# Patient Record
Sex: Female | Born: 1955
Health system: Southern US, Community
[De-identification: ages and names within clinical notes are randomized; demographics above are authoritative.]

## PROBLEM LIST (undated history)

## (undated) DIAGNOSIS — J45909 Unspecified asthma, uncomplicated: Secondary | ICD-10-CM

## (undated) DIAGNOSIS — E079 Disorder of thyroid, unspecified: Secondary | ICD-10-CM

## (undated) DIAGNOSIS — H401190 Primary open-angle glaucoma, unspecified eye, stage unspecified: Secondary | ICD-10-CM

## (undated) DIAGNOSIS — K649 Unspecified hemorrhoids: Secondary | ICD-10-CM

## (undated) DIAGNOSIS — M1990 Unspecified osteoarthritis, unspecified site: Secondary | ICD-10-CM

## (undated) DIAGNOSIS — R011 Cardiac murmur, unspecified: Secondary | ICD-10-CM

## (undated) DIAGNOSIS — I10 Essential (primary) hypertension: Secondary | ICD-10-CM

## (undated) HISTORY — DX: Unspecified asthma, uncomplicated: J45.909

## (undated) HISTORY — DX: Disorder of thyroid, unspecified: E07.9

## (undated) HISTORY — DX: Unspecified osteoarthritis, unspecified site: M19.90

## (undated) HISTORY — DX: Unspecified hemorrhoids: K64.9

## (undated) HISTORY — DX: Primary open-angle glaucoma, unspecified eye, stage unspecified: H40.1190

## (undated) HISTORY — PX: KNEE SURGERY: SHX244

## (undated) HISTORY — DX: Essential (primary) hypertension: I10

## (undated) HISTORY — PX: ABDOMINAL HYSTERECTOMY: SHX81

## (undated) HISTORY — DX: Cardiac murmur, unspecified: R01.1

## (undated) HISTORY — PX: TOTAL KNEE ARTHROPLASTY: SHX125

## (undated) HISTORY — PX: THYROIDECTOMY: SHX17

---

## 1999-03-27 ENCOUNTER — Encounter: Payer: Self-pay | Admitting: Emergency Medicine

## 1999-03-27 ENCOUNTER — Emergency Department (HOSPITAL_COMMUNITY): Admission: EM | Admit: 1999-03-27 | Discharge: 1999-03-27 | Payer: Self-pay | Admitting: Emergency Medicine

## 1999-03-29 ENCOUNTER — Ambulatory Visit: Admission: RE | Admit: 1999-03-29 | Discharge: 1999-03-29 | Payer: Self-pay | Admitting: Occupational Medicine

## 1999-03-29 ENCOUNTER — Encounter: Payer: Self-pay | Admitting: Occupational Medicine

## 1999-04-06 ENCOUNTER — Encounter: Admission: RE | Admit: 1999-04-06 | Discharge: 1999-04-06 | Payer: Self-pay | Admitting: *Deleted

## 1999-06-30 ENCOUNTER — Ambulatory Visit (HOSPITAL_BASED_OUTPATIENT_CLINIC_OR_DEPARTMENT_OTHER): Admission: RE | Admit: 1999-06-30 | Discharge: 1999-06-30 | Payer: Self-pay | Admitting: Orthopaedic Surgery

## 2000-04-11 ENCOUNTER — Encounter: Payer: Self-pay | Admitting: Orthopaedic Surgery

## 2000-04-17 ENCOUNTER — Inpatient Hospital Stay (HOSPITAL_COMMUNITY): Admission: RE | Admit: 2000-04-17 | Discharge: 2000-04-22 | Payer: Self-pay | Admitting: Orthopaedic Surgery

## 2000-07-03 ENCOUNTER — Ambulatory Visit (HOSPITAL_BASED_OUTPATIENT_CLINIC_OR_DEPARTMENT_OTHER): Admission: RE | Admit: 2000-07-03 | Discharge: 2000-07-03 | Payer: Self-pay | Admitting: Orthopaedic Surgery

## 2001-02-05 ENCOUNTER — Ambulatory Visit (HOSPITAL_BASED_OUTPATIENT_CLINIC_OR_DEPARTMENT_OTHER): Admission: RE | Admit: 2001-02-05 | Discharge: 2001-02-05 | Payer: Self-pay | Admitting: Orthopaedic Surgery

## 2001-12-02 ENCOUNTER — Emergency Department (HOSPITAL_COMMUNITY): Admission: EM | Admit: 2001-12-02 | Discharge: 2001-12-02 | Payer: Self-pay | Admitting: *Deleted

## 2001-12-09 ENCOUNTER — Emergency Department (HOSPITAL_COMMUNITY): Admission: EM | Admit: 2001-12-09 | Discharge: 2001-12-09 | Payer: Self-pay | Admitting: Emergency Medicine

## 2001-12-09 ENCOUNTER — Encounter: Payer: Self-pay | Admitting: Emergency Medicine

## 2002-01-27 ENCOUNTER — Ambulatory Visit (HOSPITAL_COMMUNITY): Admission: RE | Admit: 2002-01-27 | Discharge: 2002-01-27 | Payer: Self-pay | Admitting: Family Medicine

## 2002-09-12 ENCOUNTER — Emergency Department (HOSPITAL_COMMUNITY): Admission: EM | Admit: 2002-09-12 | Discharge: 2002-09-12 | Payer: Self-pay | Admitting: Emergency Medicine

## 2004-01-30 ENCOUNTER — Emergency Department (HOSPITAL_COMMUNITY): Admission: EM | Admit: 2004-01-30 | Discharge: 2004-01-30 | Payer: Self-pay | Admitting: Emergency Medicine

## 2005-08-01 ENCOUNTER — Ambulatory Visit: Payer: Self-pay | Admitting: Family Medicine

## 2005-08-07 ENCOUNTER — Ambulatory Visit: Payer: Self-pay | Admitting: *Deleted

## 2005-10-04 ENCOUNTER — Ambulatory Visit: Payer: Self-pay | Admitting: Family Medicine

## 2005-11-08 ENCOUNTER — Ambulatory Visit: Payer: Self-pay | Admitting: Family Medicine

## 2005-11-13 ENCOUNTER — Ambulatory Visit (HOSPITAL_COMMUNITY): Admission: RE | Admit: 2005-11-13 | Discharge: 2005-11-13 | Payer: Self-pay | Admitting: Family Medicine

## 2005-12-02 ENCOUNTER — Emergency Department (HOSPITAL_COMMUNITY): Admission: EM | Admit: 2005-12-02 | Discharge: 2005-12-02 | Payer: Self-pay | Admitting: Emergency Medicine

## 2005-12-07 ENCOUNTER — Ambulatory Visit: Payer: Self-pay | Admitting: Family Medicine

## 2005-12-15 ENCOUNTER — Ambulatory Visit: Payer: Self-pay | Admitting: Family Medicine

## 2006-04-17 ENCOUNTER — Inpatient Hospital Stay (HOSPITAL_COMMUNITY): Admission: RE | Admit: 2006-04-17 | Discharge: 2006-04-24 | Payer: Self-pay | Admitting: Orthopaedic Surgery

## 2006-04-17 DIAGNOSIS — Z9889 Other specified postprocedural states: Secondary | ICD-10-CM | POA: Insufficient documentation

## 2006-04-18 ENCOUNTER — Ambulatory Visit: Payer: Self-pay | Admitting: Physical Medicine & Rehabilitation

## 2006-04-29 ENCOUNTER — Emergency Department (HOSPITAL_COMMUNITY): Admission: EM | Admit: 2006-04-29 | Discharge: 2006-04-30 | Payer: Self-pay | Admitting: Emergency Medicine

## 2006-05-22 ENCOUNTER — Encounter: Admission: RE | Admit: 2006-05-22 | Discharge: 2006-08-20 | Payer: Self-pay | Admitting: Orthopaedic Surgery

## 2006-08-21 ENCOUNTER — Encounter: Admission: RE | Admit: 2006-08-21 | Discharge: 2006-09-25 | Payer: Self-pay | Admitting: Orthopaedic Surgery

## 2006-09-14 ENCOUNTER — Ambulatory Visit: Payer: Self-pay | Admitting: Internal Medicine

## 2007-05-22 ENCOUNTER — Encounter (INDEPENDENT_AMBULATORY_CARE_PROVIDER_SITE_OTHER): Payer: Self-pay | Admitting: Family Medicine

## 2007-07-31 ENCOUNTER — Ambulatory Visit: Payer: Self-pay | Admitting: Family Medicine

## 2007-08-23 ENCOUNTER — Encounter (INDEPENDENT_AMBULATORY_CARE_PROVIDER_SITE_OTHER): Payer: Self-pay | Admitting: Family Medicine

## 2007-09-04 ENCOUNTER — Ambulatory Visit: Payer: Self-pay | Admitting: Family Medicine

## 2007-09-04 ENCOUNTER — Telehealth (INDEPENDENT_AMBULATORY_CARE_PROVIDER_SITE_OTHER): Payer: Self-pay | Admitting: Family Medicine

## 2007-09-04 DIAGNOSIS — E1165 Type 2 diabetes mellitus with hyperglycemia: Secondary | ICD-10-CM | POA: Insufficient documentation

## 2007-09-04 DIAGNOSIS — IMO0002 Reserved for concepts with insufficient information to code with codable children: Secondary | ICD-10-CM | POA: Insufficient documentation

## 2007-09-04 DIAGNOSIS — J45909 Unspecified asthma, uncomplicated: Secondary | ICD-10-CM | POA: Insufficient documentation

## 2007-09-04 LAB — CONVERTED CEMR LAB
Bilirubin Urine: NEGATIVE
Glucose, Urine, Semiquant: NEGATIVE
Nitrite: NEGATIVE
Protein, U semiquant: 30
Specific Gravity, Urine: >=1.03
Urobilinogen, UA: NEGATIVE
WBC Urine, dipstick: NEGATIVE
pH: 5.5

## 2007-09-16 ENCOUNTER — Ambulatory Visit (HOSPITAL_COMMUNITY): Admission: RE | Admit: 2007-09-16 | Discharge: 2007-09-16 | Payer: Self-pay | Admitting: Family Medicine

## 2007-09-18 LAB — CONVERTED CEMR LAB
ALT: 9 units/L (ref 0–35)
AST: 13 units/L (ref 0–37)
Albumin: 4.4 g/dL (ref 3.5–5.2)
Alkaline Phosphatase: 108 units/L (ref 39–117)
BUN: 12 mg/dL (ref 6–23)
Basophils Absolute: 0 10*3/uL (ref 0.0–0.1)
Basophils Relative: 0 % (ref 0–1)
CO2: 26 meq/L (ref 19–32)
Calcium: 8.8 mg/dL (ref 8.4–10.5)
Chloride: 100 meq/L (ref 96–112)
Cholesterol: 165 mg/dL (ref 0–200)
Creatinine, Ser: 1.15 mg/dL (ref 0.40–1.20)
Eosinophils Absolute: 0.2 10*3/uL (ref 0.2–0.7)
Eosinophils Relative: 2 % (ref 0–5)
Glucose, Bld: 128 mg/dL — ABNORMAL HIGH (ref 70–99)
HCT: 42.3 % (ref 36.0–46.0)
HDL: 48 mg/dL (ref >39)
Hemoglobin: 13.7 g/dL (ref 12.0–15.0)
Hgb A1c MFr Bld: 7.8 % — ABNORMAL HIGH (ref 4.6–6.1)
LDL Cholesterol: 96 mg/dL (ref 0–99)
Lymphocytes Relative: 21 % (ref 12–46)
Lymphs Abs: 2.4 10*3/uL (ref 0.7–4.0)
MCHC: 32.4 g/dL (ref 30.0–36.0)
MCV: 93.4 fL (ref 78.0–100.0)
Monocytes Absolute: 0.5 10*3/uL (ref 0.1–1.0)
Monocytes Relative: 4 % (ref 3–12)
Neutro Abs: 8.2 10*3/uL — ABNORMAL HIGH (ref 1.7–7.7)
Neutrophils Relative %: 72 % (ref 43–77)
Platelets: 305 10*3/uL (ref 150–400)
Potassium: 4.2 meq/L (ref 3.5–5.3)
RBC: 4.53 M/uL (ref 3.87–5.11)
RDW: 13.2 % (ref 11.5–15.5)
Sodium: 141 meq/L (ref 135–145)
TSH: 9.271 microintl units/mL — ABNORMAL HIGH (ref 0.350–5.50)
Total Bilirubin: 0.7 mg/dL (ref 0.3–1.2)
Total CHOL/HDL Ratio: 3.4
Total Protein: 9 g/dL — ABNORMAL HIGH (ref 6.0–8.3)
Triglycerides: 104 mg/dL (ref <150)
VLDL: 21 mg/dL (ref 0–40)
WBC: 11.3 10*3/uL — ABNORMAL HIGH (ref 4.0–10.5)

## 2007-10-15 ENCOUNTER — Ambulatory Visit: Payer: Self-pay | Admitting: Family Medicine

## 2007-10-15 DIAGNOSIS — E039 Hypothyroidism, unspecified: Secondary | ICD-10-CM | POA: Insufficient documentation

## 2007-10-15 LAB — CONVERTED CEMR LAB: Blood Glucose, Fingerstick: 121

## 2007-11-09 ENCOUNTER — Emergency Department (HOSPITAL_COMMUNITY): Admission: EM | Admit: 2007-11-09 | Discharge: 2007-11-09 | Payer: Self-pay | Admitting: Emergency Medicine

## 2007-12-16 ENCOUNTER — Ambulatory Visit: Payer: Self-pay | Admitting: Family Medicine

## 2007-12-16 DIAGNOSIS — R030 Elevated blood-pressure reading, without diagnosis of hypertension: Secondary | ICD-10-CM | POA: Insufficient documentation

## 2007-12-16 DIAGNOSIS — R7989 Other specified abnormal findings of blood chemistry: Secondary | ICD-10-CM | POA: Insufficient documentation

## 2007-12-16 DIAGNOSIS — L659 Nonscarring hair loss, unspecified: Secondary | ICD-10-CM | POA: Insufficient documentation

## 2007-12-16 LAB — CONVERTED CEMR LAB
ALT: 11 units/L (ref 0–35)
AST: 12 units/L (ref 0–37)
Albumin ELP: 48.2 % — ABNORMAL LOW (ref 55.8–66.1)
Albumin: 4.2 g/dL (ref 3.5–5.2)
Alkaline Phosphatase: 97 units/L (ref 39–117)
Alpha-1-Globulin: 3.9 % (ref 2.9–4.9)
Alpha-2-Globulin: 10.1 % (ref 7.1–11.8)
BUN: 11 mg/dL (ref 6–23)
Beta Globulin: 5.6 % (ref 4.7–7.2)
Blood Glucose, Fingerstick: 153
CO2: 25 meq/L (ref 19–32)
Calcium: 9.3 mg/dL (ref 8.4–10.5)
Chloride: 103 meq/L (ref 96–112)
Creatinine, Ser: 1.08 mg/dL (ref 0.40–1.20)
Gamma Globulin: 25.7 % — ABNORMAL HIGH (ref 11.1–18.8)
Glucose, Bld: 105 mg/dL — ABNORMAL HIGH (ref 70–99)
Hgb A1c MFr Bld: 7.2 %
Potassium: 4 meq/L (ref 3.5–5.3)
Sodium: 138 meq/L (ref 135–145)
TSH: 3.162 microintl units/mL (ref 0.350–5.50)
Total Bilirubin: 0.4 mg/dL (ref 0.3–1.2)
Total Protein, Serum Electrophoresis: 8.4 g/dL — ABNORMAL HIGH (ref 6.0–8.3)
Total Protein: 8.4 g/dL — ABNORMAL HIGH (ref 6.0–8.3)

## 2007-12-17 ENCOUNTER — Encounter (INDEPENDENT_AMBULATORY_CARE_PROVIDER_SITE_OTHER): Payer: Self-pay | Admitting: Family Medicine

## 2008-01-17 ENCOUNTER — Telehealth (INDEPENDENT_AMBULATORY_CARE_PROVIDER_SITE_OTHER): Payer: Self-pay | Admitting: Family Medicine

## 2008-01-24 ENCOUNTER — Encounter (INDEPENDENT_AMBULATORY_CARE_PROVIDER_SITE_OTHER): Payer: Self-pay | Admitting: Family Medicine

## 2008-01-27 ENCOUNTER — Telehealth (INDEPENDENT_AMBULATORY_CARE_PROVIDER_SITE_OTHER): Payer: Self-pay | Admitting: *Deleted

## 2008-02-19 ENCOUNTER — Telehealth (INDEPENDENT_AMBULATORY_CARE_PROVIDER_SITE_OTHER): Payer: Self-pay | Admitting: Family Medicine

## 2008-03-18 ENCOUNTER — Ambulatory Visit: Payer: Self-pay | Admitting: Family Medicine

## 2008-03-18 LAB — CONVERTED CEMR LAB: Blood Glucose, Fingerstick: 148

## 2008-03-24 ENCOUNTER — Telehealth (INDEPENDENT_AMBULATORY_CARE_PROVIDER_SITE_OTHER): Payer: Self-pay | Admitting: *Deleted

## 2008-04-15 ENCOUNTER — Encounter (INDEPENDENT_AMBULATORY_CARE_PROVIDER_SITE_OTHER): Payer: Self-pay | Admitting: Nurse Practitioner

## 2008-04-23 ENCOUNTER — Encounter (INDEPENDENT_AMBULATORY_CARE_PROVIDER_SITE_OTHER): Payer: Self-pay | Admitting: Internal Medicine

## 2008-04-23 ENCOUNTER — Encounter (INDEPENDENT_AMBULATORY_CARE_PROVIDER_SITE_OTHER): Payer: Self-pay | Admitting: Nurse Practitioner

## 2008-04-23 DIAGNOSIS — M722 Plantar fascial fibromatosis: Secondary | ICD-10-CM | POA: Insufficient documentation

## 2008-04-23 DIAGNOSIS — M1712 Unilateral primary osteoarthritis, left knee: Secondary | ICD-10-CM | POA: Insufficient documentation

## 2008-08-18 ENCOUNTER — Ambulatory Visit: Payer: Self-pay | Admitting: Family Medicine

## 2008-08-18 DIAGNOSIS — I10 Essential (primary) hypertension: Secondary | ICD-10-CM | POA: Insufficient documentation

## 2008-08-18 LAB — CONVERTED CEMR LAB
ALT: 9 units/L (ref 0–35)
AST: 10 units/L (ref 0–37)
Albumin: 4 g/dL (ref 3.5–5.2)
Alkaline Phosphatase: 92 units/L (ref 39–117)
BUN: 14 mg/dL (ref 6–23)
Basophils Absolute: 0 10*3/uL (ref 0.0–0.1)
Basophils Relative: 0 % (ref 0–1)
Blood Glucose, Fingerstick: 121
CO2: 23 meq/L (ref 19–32)
Calcium: 8.8 mg/dL (ref 8.4–10.5)
Chloride: 106 meq/L (ref 96–112)
Cholesterol: 155 mg/dL (ref 0–200)
Creatinine, Ser: 1.02 mg/dL (ref 0.40–1.20)
Creatinine, Urine: 193.1 mg/dL
Eosinophils Absolute: 0.4 10*3/uL (ref 0.0–0.7)
Eosinophils Relative: 4 % (ref 0–5)
Glucose, Bld: 77 mg/dL (ref 70–99)
HCT: 39.2 % (ref 36.0–46.0)
HDL: 35 mg/dL — ABNORMAL LOW (ref >39)
Hemoglobin: 12.8 g/dL (ref 12.0–15.0)
Hgb A1c MFr Bld: 6.4 %
LDL Cholesterol: 101 mg/dL — ABNORMAL HIGH (ref 0–99)
Lymphocytes Relative: 26 % (ref 12–46)
Lymphs Abs: 2.6 10*3/uL (ref 0.7–4.0)
MCHC: 32.7 g/dL (ref 30.0–36.0)
MCV: 90.7 fL (ref 78.0–100.0)
Microalb Creat Ratio: 1.9 mg/g (ref 0.0–30.0)
Microalb, Ur: 0.36 mg/dL (ref 0.00–1.89)
Monocytes Absolute: 0.6 10*3/uL (ref 0.1–1.0)
Monocytes Relative: 6 % (ref 3–12)
Neutro Abs: 6.6 10*3/uL (ref 1.7–7.7)
Neutrophils Relative %: 64 % (ref 43–77)
Platelets: 269 10*3/uL (ref 150–400)
Potassium: 4.1 meq/L (ref 3.5–5.3)
RBC: 4.32 M/uL (ref 3.87–5.11)
RDW: 13.1 % (ref 11.5–15.5)
Sodium: 139 meq/L (ref 135–145)
TSH: 1.361 microintl units/mL (ref 0.350–4.50)
Total Bilirubin: 0.6 mg/dL (ref 0.3–1.2)
Total CHOL/HDL Ratio: 4.4
Total Protein: 7.8 g/dL (ref 6.0–8.3)
Triglycerides: 95 mg/dL (ref <150)
VLDL: 19 mg/dL (ref 0–40)
WBC: 10.2 10*3/uL (ref 4.0–10.5)

## 2008-08-19 ENCOUNTER — Encounter (INDEPENDENT_AMBULATORY_CARE_PROVIDER_SITE_OTHER): Payer: Self-pay | Admitting: Family Medicine

## 2008-08-21 ENCOUNTER — Encounter (INDEPENDENT_AMBULATORY_CARE_PROVIDER_SITE_OTHER): Payer: Self-pay | Admitting: Internal Medicine

## 2008-08-26 ENCOUNTER — Telehealth (INDEPENDENT_AMBULATORY_CARE_PROVIDER_SITE_OTHER): Payer: Self-pay | Admitting: *Deleted

## 2008-08-28 ENCOUNTER — Telehealth (INDEPENDENT_AMBULATORY_CARE_PROVIDER_SITE_OTHER): Payer: Self-pay | Admitting: Family Medicine

## 2008-09-01 ENCOUNTER — Ambulatory Visit: Payer: Self-pay | Admitting: Family Medicine

## 2008-09-01 DIAGNOSIS — H6690 Otitis media, unspecified, unspecified ear: Secondary | ICD-10-CM | POA: Insufficient documentation

## 2008-09-01 LAB — CONVERTED CEMR LAB: Blood Glucose, Fingerstick: 108

## 2008-09-16 ENCOUNTER — Ambulatory Visit (HOSPITAL_COMMUNITY): Admission: RE | Admit: 2008-09-16 | Discharge: 2008-09-16 | Payer: Self-pay | Admitting: Family Medicine

## 2008-10-16 ENCOUNTER — Ambulatory Visit: Payer: Self-pay | Admitting: Internal Medicine

## 2008-10-16 ENCOUNTER — Telehealth (INDEPENDENT_AMBULATORY_CARE_PROVIDER_SITE_OTHER): Payer: Self-pay | Admitting: *Deleted

## 2008-10-16 DIAGNOSIS — H6980 Other specified disorders of Eustachian tube, unspecified ear: Secondary | ICD-10-CM | POA: Insufficient documentation

## 2008-10-16 DIAGNOSIS — H6990 Unspecified Eustachian tube disorder, unspecified ear: Secondary | ICD-10-CM | POA: Insufficient documentation

## 2008-10-16 LAB — CONVERTED CEMR LAB: Blood Glucose, Fingerstick: 100

## 2008-11-30 ENCOUNTER — Encounter (INDEPENDENT_AMBULATORY_CARE_PROVIDER_SITE_OTHER): Payer: Self-pay | Admitting: Family Medicine

## 2009-01-18 ENCOUNTER — Ambulatory Visit: Payer: Self-pay | Admitting: Family Medicine

## 2009-01-18 DIAGNOSIS — K5909 Other constipation: Secondary | ICD-10-CM | POA: Insufficient documentation

## 2009-01-18 LAB — CONVERTED CEMR LAB: Blood Glucose, Fingerstick: 86

## 2009-01-26 ENCOUNTER — Telehealth (INDEPENDENT_AMBULATORY_CARE_PROVIDER_SITE_OTHER): Payer: Self-pay | Admitting: Family Medicine

## 2009-03-03 ENCOUNTER — Encounter (INDEPENDENT_AMBULATORY_CARE_PROVIDER_SITE_OTHER): Payer: Self-pay | Admitting: Internal Medicine

## 2009-03-03 ENCOUNTER — Encounter: Payer: Self-pay | Admitting: Physician Assistant

## 2009-03-04 ENCOUNTER — Encounter (INDEPENDENT_AMBULATORY_CARE_PROVIDER_SITE_OTHER): Payer: Self-pay | Admitting: Internal Medicine

## 2009-03-05 ENCOUNTER — Encounter (INDEPENDENT_AMBULATORY_CARE_PROVIDER_SITE_OTHER): Payer: Self-pay | Admitting: Internal Medicine

## 2009-03-16 ENCOUNTER — Encounter: Payer: Self-pay | Admitting: Physician Assistant

## 2009-04-19 ENCOUNTER — Telehealth (INDEPENDENT_AMBULATORY_CARE_PROVIDER_SITE_OTHER): Payer: Self-pay | Admitting: Family Medicine

## 2009-07-20 ENCOUNTER — Telehealth: Payer: Self-pay | Admitting: Physician Assistant

## 2009-08-10 ENCOUNTER — Ambulatory Visit: Payer: Self-pay | Admitting: Physician Assistant

## 2009-08-10 LAB — CONVERTED CEMR LAB
ALT: 12 units/L (ref 0–35)
AST: 15 units/L (ref 0–37)
Albumin: 4.2 g/dL (ref 3.5–5.2)
Alkaline Phosphatase: 85 units/L (ref 39–117)
BUN: 11 mg/dL (ref 6–23)
Basophils Absolute: 0.1 10*3/uL (ref 0.0–0.1)
Basophils Relative: 1 % (ref 0–1)
Blood Glucose, Fingerstick: 69
CO2: 22 meq/L (ref 19–32)
Calcium: 9.1 mg/dL (ref 8.4–10.5)
Chloride: 103 meq/L (ref 96–112)
Creatinine, Ser: 1.07 mg/dL (ref 0.40–1.20)
Eosinophils Absolute: 0.3 10*3/uL (ref 0.0–0.7)
Eosinophils Relative: 3 % (ref 0–5)
Glucose, Bld: 74 mg/dL (ref 70–99)
HCT: 40.5 % (ref 36.0–46.0)
Hemoglobin: 13.9 g/dL (ref 12.0–15.0)
Hgb A1c MFr Bld: 6.6 %
Lymphocytes Relative: 28 % (ref 12–46)
Lymphs Abs: 2.7 10*3/uL (ref 0.7–4.0)
MCHC: 34.3 g/dL (ref 30.0–36.0)
MCV: 89.6 fL (ref 78.0–100.0)
Microalb, Ur: 0.5 mg/dL (ref 0.00–1.89)
Monocytes Absolute: 0.6 10*3/uL (ref 0.1–1.0)
Monocytes Relative: 6 % (ref 3–12)
Neutro Abs: 6 10*3/uL (ref 1.7–7.7)
Neutrophils Relative %: 62 % (ref 43–77)
Platelets: 288 10*3/uL (ref 150–400)
Potassium: 4.2 meq/L (ref 3.5–5.3)
RBC: 4.52 M/uL (ref 3.87–5.11)
RDW: 13.2 % (ref 11.5–15.5)
Sodium: 140 meq/L (ref 135–145)
TSH: 3.019 microintl units/mL (ref 0.350–4.500)
Total Bilirubin: 0.5 mg/dL (ref 0.3–1.2)
Total Protein: 7.9 g/dL (ref 6.0–8.3)
WBC: 9.7 10*3/uL (ref 4.0–10.5)

## 2009-08-11 ENCOUNTER — Telehealth: Payer: Self-pay | Admitting: Physician Assistant

## 2009-08-11 ENCOUNTER — Encounter: Payer: Self-pay | Admitting: Physician Assistant

## 2009-08-17 ENCOUNTER — Encounter: Payer: Self-pay | Admitting: Physician Assistant

## 2009-09-22 ENCOUNTER — Ambulatory Visit (HOSPITAL_COMMUNITY): Admission: RE | Admit: 2009-09-22 | Discharge: 2009-09-22 | Payer: Self-pay | Admitting: Internal Medicine

## 2009-10-22 ENCOUNTER — Other Ambulatory Visit: Admission: RE | Admit: 2009-10-22 | Discharge: 2009-10-22 | Payer: Self-pay | Admitting: Internal Medicine

## 2009-10-22 ENCOUNTER — Ambulatory Visit: Payer: Self-pay | Admitting: Physician Assistant

## 2009-10-22 DIAGNOSIS — R82998 Other abnormal findings in urine: Secondary | ICD-10-CM | POA: Insufficient documentation

## 2009-10-22 LAB — CONVERTED CEMR LAB
Bilirubin Urine: NEGATIVE
Blood Glucose, Fingerstick: 143
Glucose, Urine, Semiquant: NEGATIVE
Hgb A1c MFr Bld: 6.8 %
KOH Prep: NEGATIVE
Nitrite: NEGATIVE
Specific Gravity, Urine: >=1.03
Urobilinogen, UA: 0.2
Whiff Test: NEGATIVE
pH: 5.5

## 2009-10-23 ENCOUNTER — Encounter: Payer: Self-pay | Admitting: Physician Assistant

## 2009-10-26 ENCOUNTER — Telehealth: Payer: Self-pay | Admitting: Physician Assistant

## 2009-10-26 LAB — CONVERTED CEMR LAB
ALT: 13 units/L (ref 0–35)
AST: 16 units/L (ref 0–37)
Albumin: 3.9 g/dL (ref 3.5–5.2)
Alkaline Phosphatase: 96 units/L (ref 39–117)
BUN: 11 mg/dL (ref 6–23)
Bacteria, UA: NONE SEEN
CO2: 27 meq/L (ref 19–32)
Calcium: 9.1 mg/dL (ref 8.4–10.5)
Casts: NONE SEEN /lpf
Chloride: 104 meq/L (ref 96–112)
Cholesterol: 162 mg/dL (ref 0–200)
Creatinine, Ser: 1.04 mg/dL (ref 0.40–1.20)
Crystals: NONE SEEN
Glucose, Bld: 124 mg/dL — ABNORMAL HIGH (ref 70–99)
HDL: 33 mg/dL — ABNORMAL LOW (ref >39)
LDL Cholesterol: 108 mg/dL — ABNORMAL HIGH (ref 0–99)
Pap Smear: NEGATIVE
Potassium: 4.5 meq/L (ref 3.5–5.3)
RBC / HPF: NONE SEEN (ref <3)
Sex Hormone Binding: 35 nmol/L (ref 18–114)
Sodium: 143 meq/L (ref 135–145)
Testosterone Free: 7.7 pg/mL — ABNORMAL HIGH (ref 0.6–6.8)
Testosterone-% Free: 1.7 % (ref 0.4–2.4)
Testosterone: 44.59 ng/dL (ref 10–70)
Total Bilirubin: 0.5 mg/dL (ref 0.3–1.2)
Total CHOL/HDL Ratio: 4.9
Total Protein: 7.5 g/dL (ref 6.0–8.3)
Triglycerides: 103 mg/dL (ref <150)
VLDL: 21 mg/dL (ref 0–40)

## 2009-11-05 ENCOUNTER — Ambulatory Visit: Payer: Self-pay | Admitting: Physician Assistant

## 2009-11-05 LAB — CONVERTED CEMR LAB
BUN: 16 mg/dL (ref 6–23)
CO2: 22 meq/L (ref 19–32)
Calcium: 9.1 mg/dL (ref 8.4–10.5)
Chloride: 103 meq/L (ref 96–112)
Creatinine, Ser: 1.08 mg/dL (ref 0.40–1.20)
Glucose, Bld: 149 mg/dL — ABNORMAL HIGH (ref 70–99)
Potassium: 4.3 meq/L (ref 3.5–5.3)
Sodium: 139 meq/L (ref 135–145)

## 2009-11-08 ENCOUNTER — Encounter: Payer: Self-pay | Admitting: Physician Assistant

## 2009-11-19 ENCOUNTER — Ambulatory Visit: Payer: Self-pay | Admitting: Physician Assistant

## 2009-11-19 DIAGNOSIS — L919 Hypertrophic disorder of the skin, unspecified: Secondary | ICD-10-CM

## 2009-11-19 DIAGNOSIS — L909 Atrophic disorder of skin, unspecified: Secondary | ICD-10-CM | POA: Insufficient documentation

## 2009-11-19 DIAGNOSIS — L723 Sebaceous cyst: Secondary | ICD-10-CM | POA: Insufficient documentation

## 2009-11-23 ENCOUNTER — Ambulatory Visit: Payer: Self-pay | Admitting: Physician Assistant

## 2009-11-23 LAB — CONVERTED CEMR LAB
BUN: 16 mg/dL (ref 6–23)
CO2: 22 meq/L (ref 19–32)
Calcium: 9.6 mg/dL (ref 8.4–10.5)
Chloride: 105 meq/L (ref 96–112)
Creatinine, Ser: 0.98 mg/dL (ref 0.40–1.20)
Glucose, Bld: 144 mg/dL — ABNORMAL HIGH (ref 70–99)
Potassium: 4.5 meq/L (ref 3.5–5.3)
Sodium: 141 meq/L (ref 135–145)

## 2009-11-24 ENCOUNTER — Encounter: Payer: Self-pay | Admitting: Physician Assistant

## 2009-12-07 ENCOUNTER — Ambulatory Visit: Payer: Self-pay | Admitting: Physician Assistant

## 2009-12-07 LAB — CONVERTED CEMR LAB
BUN: 13 mg/dL (ref 6–23)
CO2: 25 meq/L (ref 19–32)
Calcium: 9.3 mg/dL (ref 8.4–10.5)
Chloride: 101 meq/L (ref 96–112)
Creatinine, Ser: 1.13 mg/dL (ref 0.40–1.20)
Glucose, Bld: 153 mg/dL — ABNORMAL HIGH (ref 70–99)
Potassium: 4.1 meq/L (ref 3.5–5.3)
Sodium: 138 meq/L (ref 135–145)

## 2009-12-08 ENCOUNTER — Encounter: Payer: Self-pay | Admitting: Physician Assistant

## 2010-01-17 ENCOUNTER — Telehealth: Payer: Self-pay | Admitting: Physician Assistant

## 2010-02-17 ENCOUNTER — Telehealth: Payer: Self-pay | Admitting: Physician Assistant

## 2010-07-26 ENCOUNTER — Ambulatory Visit: Payer: Self-pay | Admitting: Nurse Practitioner

## 2010-07-26 DIAGNOSIS — D179 Benign lipomatous neoplasm, unspecified: Secondary | ICD-10-CM | POA: Insufficient documentation

## 2010-07-26 LAB — CONVERTED CEMR LAB
Bilirubin Urine: NEGATIVE
Blood Glucose, Fingerstick: 156
Glucose, Urine, Semiquant: NEGATIVE
Hgb A1c MFr Bld: 6.8 %
Ketones, urine, test strip: NEGATIVE
Microalb, Ur: 0.5 mg/dL (ref 0.00–1.89)
Nitrite: NEGATIVE
Protein, U semiquant: NEGATIVE
Specific Gravity, Urine: 1.025
Urobilinogen, UA: 0.2
WBC Urine, dipstick: NEGATIVE
pH: 5

## 2010-08-16 ENCOUNTER — Ambulatory Visit: Payer: Self-pay | Admitting: Nurse Practitioner

## 2010-08-16 LAB — CONVERTED CEMR LAB
Basophils Absolute: 0.1 10*3/uL (ref 0.0–0.1)
Basophils Relative: 1 % (ref 0–1)
Eosinophils Absolute: 0.4 10*3/uL (ref 0.0–0.7)
Eosinophils Relative: 4 % (ref 0–5)
HCT: 39.8 % (ref 36.0–46.0)
Hemoglobin: 13.8 g/dL (ref 12.0–15.0)
Lymphocytes Relative: 29 % (ref 12–46)
Lymphs Abs: 2.5 10*3/uL (ref 0.7–4.0)
MCHC: 34.7 g/dL (ref 30.0–36.0)
MCV: 92.6 fL (ref 78.0–100.0)
Monocytes Absolute: 0.4 10*3/uL (ref 0.1–1.0)
Monocytes Relative: 4 % (ref 3–12)
Neutro Abs: 5.5 10*3/uL (ref 1.7–7.7)
Neutrophils Relative %: 62 % (ref 43–77)
Platelets: 274 10*3/uL (ref 150–400)
RBC: 4.3 M/uL (ref 3.87–5.11)
RDW: 13.5 % (ref 11.5–15.5)
TSH: 2.613 microintl units/mL (ref 0.350–4.500)
WBC: 8.8 10*3/uL (ref 4.0–10.5)

## 2010-08-23 ENCOUNTER — Ambulatory Visit: Payer: Self-pay | Admitting: Nurse Practitioner

## 2010-08-23 LAB — CONVERTED CEMR LAB: Blood Glucose, Fingerstick: 93

## 2010-08-25 ENCOUNTER — Telehealth (INDEPENDENT_AMBULATORY_CARE_PROVIDER_SITE_OTHER): Payer: Self-pay | Admitting: Nurse Practitioner

## 2010-08-25 ENCOUNTER — Ambulatory Visit (HOSPITAL_COMMUNITY)
Admission: RE | Admit: 2010-08-25 | Discharge: 2010-08-25 | Payer: Self-pay | Source: Home / Self Care | Attending: Internal Medicine | Admitting: Internal Medicine

## 2010-09-23 ENCOUNTER — Ambulatory Visit (HOSPITAL_COMMUNITY)
Admission: RE | Admit: 2010-09-23 | Discharge: 2010-09-23 | Payer: Self-pay | Source: Home / Self Care | Attending: Internal Medicine | Admitting: Internal Medicine

## 2010-10-18 NOTE — Assessment & Plan Note (Signed)
Summary: f/u appt and labs/flu injection///cns   Vital Signs:  Patient profile:   55 year old female Weight:      256 pounds Temp:     98.2 degrees F Pulse rate:   89 / minute Pulse rhythm:   regular Resp:     18 per minute BP sitting:   134 / 79  (left arm) Cuff size:   large  Vitals Entered By: Vesta Mixer CMA (August 10, 2009 2:07 PM) CC: needs med refills, flu shot and would like to set up for CPP, Hypertension Management Is Patient Diabetic? Yes Pain Assessment Patient in pain? no      CBG Result 69  Does patient need assistance? Ambulation Normal   CC:  needs med refills, flu shot and would like to set up for CPP, and Hypertension Management.  History of Present Illness: First meeting with patient.  Previously followed by Dr. Barbaraann Barthel.  DM:  Does not check sugars.  Eye Dr is Dr. Wynona Luna.  Has annual eye exam.  Sees him q 6 mos.  No foot problems other than plantar fasciitis on the right.  Notes problems with alopecia.  Had colo. with Dr. Elnoria Howard.  Placed on amitiza for constipation.  No colo report received yet.    Asthma History    Initial Asthma Severity Rating:    Age range: 12+ years    Symptoms: 0-2 days/week    Nighttime Awakenings: 0-2/month    Interferes w/ normal activity: no limitations    SABA use (not for EIB): 0-2 days/week    Asthma Severity Assessment: Intermittent  Hypertension History:      She denies chest pain, dyspnea with exertion, orthopnea, and syncope.        Positive major cardiovascular risk factors include female age 11 years old or older, diabetes, and hypertension.  Negative major cardiovascular risk factors include negative family history for ischemic heart disease and non-tobacco-user status.       Habits & Providers     Ophthalmologist: Dr. Hanley Seamen     Orthopedist: Dr. Jerl Santos  Problems Prior to Update: 1)  Constipation, Chronic  (ICD-564.09) 2)  Eustachian Tube Dysfunction, Right  (ICD-381.81) 3)  Lom   (ICD-382.9) 4)  Hypertension  (ICD-401.9) 5)  Unspecified Breast Screening  (ICD-V76.10) 6)  Essential Hypertension, Benign  (ICD-401.1) 7)  Plantar Fasciitis, Right  (ICD-728.71) 8)  Arthroscopy, Right Knee, Hx of  (ICD-V45.89) 9)  Osteoarthritis, Knee, Left  (ICD-715.96) 10)  Hair Loss  (ICD-704.00) 11)  Elevated Bp Reading Without Dx Hypertension  (ICD-796.2) 12)  Other Abnormal Blood Chemistry  (ICD-790.6) 13)  Hypothyroidism  (ICD-244.9) 14)  Examination, Routine Medical  (ICD-V70.0) 15)  Asthma  (ICD-493.90) 16)  Diabetes Mellitus, Type II  (ICD-250.00) 17)  Need Prophylactic Vaccination&inoculation Flu  (ICD-V04.81)  Current Medications (verified): 1)  Levothroid 50 Mcg  Tabs (Levothyroxine Sodium) .... Take 1 Tablet By Mouth Once A Day For Thyroid 2)  Glucometer and Diabetic Supplies. .... Check Blood Sugar Two Times A Day and Record  Dx=250.0. Not On Insulin 3)  Glucometer Lancets and Strips .... Check Cbg Two Times A Day and Record Dx=250.0 Has High Sugars 4)  Diabetic Tennis Shoes .... Has Pre-Ulcer Calluses Diabetic 5)  Lisinopril 10 Mg  Tabs (Lisinopril) .... Take 1 Tab By Mouth Each Morning. 6)  Proventil Hfa 108 (90 Base) Mcg/act Aers (Albuterol Sulfate) .... Use 2 Puffs Every 4-6 Hours As Needed For Wheezing 7)  Travatan 0.004 % Soln (Travoprost) .... One  Drop Into Each Eye Every Morning 8)  Metformin Hcl 500 Mg Tabs (Metformin Hcl) .... Take Two Tabs By Mouth in The Morning 9)  Loratadine 10 Mg Tabs (Loratadine) .Marland Kitchen.. 1 Tab By Mouth Daily 10)  Fluticasone Propionate 50 Mcg/act Susp (Fluticasone Propionate) .... 2 Sprays Each Nostril Daily 11)  Amitiza 8 Mcg Caps (Lubiprostone) .... Unknown Strength and Dose--Dr. Hung:for Constipation  Allergies (verified): 1)  ! Penicillin 2)  ! Coumadin 3)  ! * Latex  Past History:  Past Medical History: Diabetes mellitus, type II (diet-controlled). 01/2002.Starts oral medicine 1/09. Asthma Hypothyroidism Hypertension  (08/18/2008) Chronic Constipation Right foot plantar fasciitis Glaucoma suspect (on Travatan) Osteoarthritis bilat knees   a.  has knee injection q 3 mos with ortho.  Past Surgical History: s/p right knee replacement 04/17/2000 and then revision 04/17/2006 s/p cataract surgery bilaterally 10/07 s/p total hysterectomy (TAH/BSO) 1983 s/p thyroidectomy,1973 for over-active thyroid. s/p left knee arthroscopy; distant  Family History: Mother living has ?MS, DM,HTN Father living DM,HTN,glaucoma,alzheimers, s/p leg amp.  No known cardiac  or strokes in family. Sister died liver cancer age 17. Sister died "cancer" age 53.  Social History: Reviewed history from 10/15/2007 and no changes required. Occupation: on disability;prior CNA H/o disability for knee replacements Single Former Smoker Alcohol use-no Drug use-no Completed GED 1998  Review of Systems General:  Complains of fatigue. GI:  Complains of constipation; denies bloody stools and dark tarry stools. Endo:  Denies cold intolerance and heat intolerance; +alopecia.   Impression & Recommendations:  Problem # 1:  DIABETES MELLITUS, TYPE II (ICD-250.00) A1C pending  The following medications were removed from the medication list:    Glucophage Xr 500 Mg Tb24 (Metformin hcl) .Marland Kitchen... Take 2 tablets po  every morning Her updated medication list for this problem includes:    Lisinopril 10 Mg Tabs (Lisinopril) .Marland Kitchen... Take 1 tab by mouth each morning.    Metformin Hcl 500 Mg Tabs (Metformin hcl) .Marland Kitchen... Take two tabs by mouth in the morning  Orders: Capillary Blood Glucose/CBG (04540) T-Urine Microalbumin w/creat. ratio [phone])  Problem # 2:  HYPERTENSION (ICD-401.9) could be better  cont current rx if still above goal at CPP, change lisinopril to 20mg   Her updated medication list for this problem includes:    Lisinopril 10 Mg Tabs (Lisinopril) .Marland Kitchen... Take 1 tab by mouth each morning.  Orders: T-Comprehensive  Metabolic Panel (13086-57846)  Problem # 3:  HAIR LOSS (ICD-704.00) recheck TSH, CBC ? related to thyroid replacement  Orders: T-CBC w/Diff (96295-28413) T-TSH [phone])  Problem # 4:  HYPOTHYROIDISM (ICD-244.9) as above  refill meds  Her updated medication list for this problem includes:    Levothroid 50 Mcg Tabs (Levothyroxine sodium) .Marland Kitchen... Take 1 tablet by mouth once a day for thyroid  Orders: T-TSH ([id number])  Problem # 5:  EXAMINATION, ROUTINE MEDICAL (ICD-V70.0) Flu shot today schedule CPP  Problem # 6:  ASTHMA (ICD-493.90) controlled  Her updated medication list for this problem includes:    Proventil Hfa 108 (90 Base) Mcg/act Aers (Albuterol sulfate) ..... Use 2 puffs every 4-6 hours as needed for wheezing  Complete Medication List: 1)  Levothroid 50 Mcg Tabs (Levothyroxine sodium) .... Take 1 tablet by mouth once a day for thyroid 2)  Glucometer and Diabetic Supplies.  .... Check blood sugar two times a day and record  dx=250.0. not on insulin 3)  Glucometer Lancets and Strips  .... Check cbg two times a day and record dx=250.0 has high sugars 4)  Diabetic  Tennis Shoes  .... Has pre-ulcer calluses diabetic 5)  Lisinopril 10 Mg Tabs (Lisinopril) .... Take 1 tab by mouth each morning. 6)  Proventil Hfa 108 (90 Base) Mcg/act Aers (Albuterol sulfate) .... Use 2 puffs every 4-6 hours as needed for wheezing 7)  Travatan 0.004 % Soln (Travoprost) .... One drop into each eye every morning 8)  Metformin Hcl 500 Mg Tabs (Metformin hcl) .... Take two tabs by mouth in the morning 9)  Loratadine 10 Mg Tabs (Loratadine) .Marland Kitchen.. 1 tab by mouth daily 10)  Fluticasone Propionate 50 Mcg/act Susp (Fluticasone propionate) .... 2 sprays each nostril daily 11)  Amitiza 24 Mcg Caps (Lubiprostone) .... Take 1 capsule by mouth two times a day  Other Orders: Flu Vaccine 39yrs + ([address]) Admin 1st Vaccine ([address]) Admin 1st Vaccine [facility]) [phone])  Hypertension Assessment/Plan:       The patient's hypertensive risk group is category C: Target organ damage and/or diabetes.  Her calculated 10 year risk of coronary heart disease is 20 %.  Today's blood pressure is 134/79.  Her blood pressure goal is < 130/80.  Patient Instructions: 1)  Please sign form to get record of colonoscopy from Dr. Elnoria Howard sent to me. 2)  Flu shot today. 3)  Please schedule a follow-up appointment in 3 months with Ottilie Wigglesworth for CPP. 4)    Prescriptions: METFORMIN HCL 500 MG TABS (METFORMIN HCL) take two tabs by mouth in the morning  #180 x 3   Entered and Authorized by:   Tereso Newcomer PA-C   Signed by:   Tereso Newcomer PA-C on 08/10/2009   Method used:   Electronically to        [facility] [phone]* (retail)       [address]       [place], [place]  [address]       Ph: [phone]       Fax: [phone]   RxID:   [id number] LEVOTHROID 50 MCG  TABS (LEVOTHYROXINE SODIUM) Take 1 tablet by mouth once a day for thyroid  #90 x 3   Entered and Authorized by:   Tereso Newcomer PA-C   Signed by:   Tereso Newcomer PA-C on 08/10/2009   Method used:   Electronically to        [facility] [phone]* (retail)       [address]       [place], [place]  [address]       Ph: [phone]       Fax: [phone]   RxID:   [id number]    Influenza Vaccine    Vaccine Type: Fluvax 3+    Site: left deltoid    Mfr: Sanofi Pasteur    Dose: 0.5 ml    Route: IM    Given by: Armenia Shannon    Exp. Date: 03/17/2010    Lot #: [id number]    VIS given: 04/11/07 version given August 11, 2009.  Flu Vaccine Consent Questions    Do you have a history of severe allergic reactions to this vaccine? no    Any prior history of allergic reactions to egg and/or gelatin? no    Do you have a sensitivity to the preservative Thimersol? no    Do you have a past history of Guillan-Barre Syndrome? no    Do you currently have an acute febrile illness? no    Have you ever had a severe reaction to latex? no    Vaccine  information given and explained  to patient? yes    Are you currently pregnant? no    Laboratory Results   Blood Tests     HGBA1C: 6.6%   (Normal Range: Non-Diabetic - 3-6%   Control Diabetic - 6-8%) CBG Random:: 69mg /dL     Appended Document: colonoscopy report    Clinical Lists Changes  Observations: Added new observation of COLONNXTDUE: 03/2014 (10/01/2009 17:02) Added new observation of COLONOSCOPY: polyp (03/16/2009 17:05) Added new observation of COLONRECACT: Further recommendations pending biopsy results.  Repeat colonoscopy in 5 years.  (03/16/2009 17:03) Added new observation of COLONOSCOPY: 1. Sessile polyp - removed 2. Large Hemorrhoids  (03/16/2009 17:03)       Colonoscopy  Procedure date:  03/16/2009  Findings:      1. Sessile polyp - removed 2. Large Hemorrhoids   Comments:      Further recommendations pending biopsy results.  Repeat colonoscopy in 5 years.    Preventive Care Screening  Colonoscopy:    Date:  03/16/2009    Next Due:  03/2014    Results:  1. Sessile polyp - removed 2. Large Hemorrhoids

## 2010-10-18 NOTE — Letter (Signed)
Summary: Handout Printed  Printed Handout:  - Lipoma (Fatty Tumor) 

## 2010-10-18 NOTE — Letter (Signed)
Summary: *HSN Results Follow up  [facility]  [address] [place], [place] [address]   Phone: [phone]  Fax: [phone]      11/24/2009   Kayla Bean [address] [place], [place]  [address]   Dear  [name],                            ____S.Drinkard,FNP   ____D. Gore,FNP       ____B. McPherson,MD   ____V. Rankins,MD    ____E. Mulberry,MD    ____N. Daphine Deutscher, FNP  ____D. Reche Dixon, MD    ____K. Philipp Deputy, MD    __x__S. Alben Spittle, PA-C     This letter is to inform you that your recent test(s):  _______Pap Smear    ___x____Lab Test     _______X-ray    ___x____ is within acceptable limits  _______ requires a medication change  _______ requires a follow-up lab visit  _______ requires a follow-up visit with your provider   Comments:       _________________________________________________________ If you have any questions, please contact our office                     Sincerely,  Tereso Newcomer PA-C [facility]

## 2010-10-18 NOTE — Letter (Signed)
Summary: *HSN Results Follow up  [facility]  [address] [place], [place] [address]   Phone: [phone]  Fax: [phone]      08/11/2009   ROSHANA SHUFFIELD [address] [place], [place]  [address]   Dear  Ms. Merial Hams,                            ____S.Drinkard,FNP   ____D. Gore,FNP       ____B. McPherson,MD   ____V. Rankins,MD    ____E. Mulberry,MD    ____N. Daphine Deutscher, FNP  ____D. Reche Dixon, MD    ____K. Philipp Deputy, MD    __x__S. Alben Spittle, PA-C     This letter is to inform you that your recent test(s):  _______Pap Smear    ___x____Lab Test     _______X-ray    ____x___ is within acceptable limits  _______ requires a medication change  _______ requires a follow-up lab visit  _______ requires a follow-up visit with your provider   Comments:       _________________________________________________________ If you have any questions, please contact our office                     Sincerely,  Tereso Newcomer PA-C [facility]

## 2010-10-18 NOTE — Progress Notes (Signed)
Summary: Tennis Shoes   Phone Note Call from Patient   Reason for Call: Talk to Nurse, Talk to Doctor Summary of Call: Dr Barbaraann Barthel  Order a Tennis Shoes for her but the place that Dr Barbaraann Barthel  give to her Don't accept Medicaid. and pt wants to know where else can she go . Please, call her at [phone] Thank You  Initial call taken by: Cheryll Dessert,  March 24, 2008 12:01 PM  Follow-up for Phone Call        pt is scheduled to go to [employer] to see if she can use her medicaid to get the special tennis shoes. She will contact us after the appt if there is anything else needed from her provider.(agreed) Follow-up by: Mikey College CMA,  March 27, 2008 10:55 AM

## 2010-10-18 NOTE — Letter (Signed)
Summary: GI NOTES  GI NOTES   Imported By: Arta Bruce 11/04/2009 14:28:13  _____________________________________________________________________  External Attachment:    Type:   Image     Comment:   External Document

## 2010-10-18 NOTE — Letter (Signed)
Summary: REQUESTING RECORDS FROM [facility]  REQUESTING RECORDS FROM [facility]   Imported By: Arta Bruce 10/08/2009 09:33:49  _____________________________________________________________________  External Attachment:    Type:   Image     Comment:   External Document

## 2010-10-18 NOTE — Progress Notes (Signed)
  Phone Note Other Incoming   Summary of Call: Ask patient if there is any reason why she would not be able to take an Aspirin a day (any h/o bleeding problems, bleeding ulcers, bleeding in brain, allergies, etc.). If she can take, have her start ASA 81mg  once daily.  Initial call taken by: Brynda Rim,  August 11, 2009 2:14 PM  Follow-up for Phone Call        pt says she is able to take asa and she will start Follow-up by: Armenia Shannon,  August 25, 2009 4:54 PM

## 2010-10-18 NOTE — Letter (Signed)
Summary: [facility]  [facility]   Imported By: Arta Bruce 05/29/2007 16:28:40  _____________________________________________________________________  External Attachment:    Type:   Image     Comment:   External Document

## 2010-10-18 NOTE — Progress Notes (Signed)
Summary: [facility] DIDNT GET ENOUGH SERUM  Phone Note Other Incoming   Caller: [facility] Summary of Call: [facility] CALLED AND SAYS THAT THE DHEA WASNT ENOUGH SERUM AND IF YOU ARE GOING TO RECOLLECT IT, DO IT IN [place] Initial call taken by: Leodis Rains,  October 26, 2009 2:58 PM  Follow-up for Phone Call        pt has lab appt on the 18th... can this wait until the 18th of Feb or would you like for me to call pt to reschedule sooner Follow-up by: Armenia Shannon,  October 26, 2009 4:11 PM  Additional Follow-up for Phone Call Additional follow up Details #1::        In looking at her case, the medicine that she would take if her levels were high is not one I would really want her on at the doses that are rec. So we can cancel the test and let the patient know why. She did not seem too interested in taking that medicine when I saw her at her CPP anyway. Let me know if she has any ques. Additional Follow-up by: Tereso Newcomer PA-C,  October 26, 2009 4:51 PM    Additional Follow-up for Phone Call Additional follow up Details #2::    pt will only come in for regular bp and bmet  Follow-up by: Armenia Shannon,  October 27, 2009 11:36 AM

## 2010-10-18 NOTE — Progress Notes (Signed)
  Phone Note Call from Patient Call back at [facility] Phone [phone]   Caller: Patient Summary of Call: The patient is requesting form more refills from her levothyroxine medication.  [facility]) Dr. Barbaraann Barthel Initial call taken by: Manon Hilding,  Jan 17, 2008 11:20 AM  Follow-up for Phone Call        forward to provider Follow-up by: Mikey College CMA,  Jan 17, 2008 2:05 PM  Additional Follow-up for Phone Call Additional follow up Details #1::        medication just sent electronically to pharmacy notify pt  Additional Follow-up by: Lehman Prom FNP,  Jan 17, 2008 2:33 PM    Additional Follow-up for Phone Call Additional follow up Details #2::    left message to pick up meds @pharmacy . Follow-up by: Mikey College CMA,  Jan 20, 2008 9:37 AM

## 2010-10-18 NOTE — Letter (Signed)
Summary: *HSN Results Follow up  [facility]  [address] [place], [place] [address]   Phone: [phone]  Fax: [phone]      11/08/2009   Kayla Bean [address] [place], [place]  [address]   Dear  [name],                            ____S.Drinkard,FNP   ____D. Gore,FNP       ____B. McPherson,MD   ____V. Rankins,MD    ____E. Mulberry,MD    ____N. Daphine Deutscher, FNP  ____D. Reche Dixon, MD    ____K. Philipp Deputy, MD    __x__S. Alben Spittle, PA-C     This letter is to inform you that your recent test(s):  _______Pap Smear    ___x____Lab Test     _______X-ray    ___x____ is within acceptable limits  _______ requires a medication change  _______ requires a follow-up lab visit  _______ requires a follow-up visit with your provider   Comments:       _________________________________________________________ If you have any questions, please contact our office                     Sincerely,  Tereso Newcomer PA-C [facility]

## 2010-10-18 NOTE — Letter (Signed)
Summary: *HSN Results Follow up  [facility]  [address] [place], [place] [address]   Phone: [phone]  Fax: [phone]      12/08/2009   Kayla Bean [address] [place], [place]  [address]   Dear  Ms. Savvy Loper,                            ____S.Drinkard,FNP   ____D. Gore,FNP       ____B. McPherson,MD   ____V. Rankins,MD    ____E. Mulberry,MD    ____N. Daphine Deutscher, FNP  ____D. Reche Dixon, MD    ____K. Philipp Deputy, MD    __x__S. Alben Spittle, PA-C     This letter is to inform you that your recent test(s):  _______Pap Smear    ___x____Lab Test     _______X-ray    ___x____ is within acceptable limits  _______ requires a medication change  _______ requires a follow-up lab visit  _______ requires a follow-up visit with your provider   Comments:       _________________________________________________________ If you have any questions, please contact our office                     Sincerely,  Tereso Newcomer PA-C [facility]

## 2010-10-18 NOTE — Progress Notes (Signed)
Summary: NEED REFILL  Phone Note Call from Patient Call back at Home Phone [phone]   Reason for Call: Refill Medication Summary of Call: WEAVER PT. MS [employer] CALLED AND SAYS THAT SCOTT TOLD HER THAT WHEN SHE NEEDED ANOTHER REFILL ON HER HER AMITIZA, THAT HE WOULD TAKE OVER REFILLING IT FOR HER, AND NOW SHE IS NEEDING THE REFILLS. DR Luisa Hart HUNG WHO DID HER COLONOSCOPY, IS THE PROVIDER THAT PRESCRIBED THIS MED. Initial call taken by: Leodis Rains,  February 17, 2010 8:53 AM  Follow-up for Phone Call        forward to provider, last filled by dr. hung... did not see what pt was talking about scott taking over.. Armenia Shannon  February 17, 2010 10:34 AM   Additional Follow-up for Phone Call Additional follow up Details #1::        I do see that med was originally prescribed by Dr. Elnoria Howard.  Will leave for PCP to determine if he will now be responsible for filling this med Additional Follow-up by: Lehman Prom FNP,  February 17, 2010 3:14 PM    Additional Follow-up for Phone Call Additional follow up Details #2::    Does she still need it?  What pharmacy?  Follow-up by: Tereso Newcomer PA-C,  February 20, 2010 2:00 PM  Additional Follow-up for Phone Call Additional follow up Details #3:: Details for Additional Follow-up Action Taken: tried calling pt but no answer .Marland KitchenMarland KitchenArmenia Shannon  February 21, 2010 9:50 AM  Spoke with pt. - states she still needs it -- uses [facility] [address]  Dutch Quint RN  February 21, 2010 2:42 PM  Spoke with pt. and advised that Rx refilled.  Dutch Quint RN  February 21, 2010 4:31 PM  Additional Follow-up by: Dutch Quint RN,  February 21, 2010 4:31 PM  Prescriptions: AMITIZA 24 MCG CAPS (LUBIPROSTONE) Take 1 capsule by mouth two times a day  #60 x 3   Entered and Authorized by:   Tereso Newcomer PA-C   Signed by:   Tereso Newcomer PA-C on 02/21/2010   Method used:   Electronically to        [facility] [phone]* (retail)       [address]       [place], [place]   [address]       Ph: [phone]       Fax: [phone]   RxID:   [id number]

## 2010-10-18 NOTE — Progress Notes (Signed)
  Phone Note Call from Patient Call back at [facility] Phone [phone]   Caller: Patient Summary of Call: The patient use to do vaginal douche but is giving her some problems.  She cannot  use the toiter paper because is very irriatated.  [address] Barbaraann Barthel  Initial call taken by: Manon Hilding,  February 19, 2008 11:22 AM  Follow-up for Phone Call        Used the douche about 2 weeks ago and irritaiton started about 2 days after that.  , to see if something has been called into [facility] for which is now a yeast infection,and she is very irritated and can't hardly use toilet paper.  It is very itchy and feels like it is on fire. Allergy to pcn Follow-up by: Vesta Mixer CMA  February 21, 2008 9:19 AM  Additional Follow-up for Phone Call Additional follow up Details #1::        notify pt that diflucan has been sent [facility] [address]. if symptoms persist she will need f/u. also be sure blood sugars are controlled as elevations can lead to yeast infections Additional Follow-up by: Lehman Prom FNP,  February 21, 2008 2:19 PM    Additional Follow-up for Phone Call Additional follow up Details #2::    PATIENT AWARE OF MEDICINE BEING SENT TO [facility] [address] Follow-up by: Leodis Rains,  February 21, 2008 3:45 PM  New/Updated Medications: FLUCONAZOLE 200 MG  TABS (FLUCONAZOLE) 1 tablet by mouth x 1 dose   Prescriptions: FLUCONAZOLE 200 MG  TABS (FLUCONAZOLE) 1 tablet by mouth x 1 dose  #1 x 0   Entered and Authorized by:   Lehman Prom FNP   Signed by:   Lehman Prom FNP on 02/21/2008   Method used:   Electronically sent to ...       [address]       [address]       [place], [place]  [address]       Ph: [phone]       Fax: [phone]   RxID:   [id number] FLUCONAZOLE 200 MG  TABS (FLUCONAZOLE) 1 tablet by mouth x 1 dose  #1 x 0   Entered and Authorized by:   Lehman Prom FNP   Signed by:   Lehman Prom FNP on 02/21/2008   Method used:    Electronically sent to ...       [address]       [address]       [place], [place]  [address]       Ph: [phone]       Fax: [phone]   RxID:   [id number]

## 2010-10-18 NOTE — Progress Notes (Signed)
Summary: DOES SHE NEED HORMONES MEDICATION  Phone Note Call from Patient   Caller: Patient Summary of Call: Delon Revelo PT. MS. Hurn FORGOT TO MENTION IN HER VISIT TODAY IF SHE MAY NEED A HORMONE MEDICAITON OR SOME ESTROGEN. Initial call taken by: Leodis Rains,  September 04, 2007 10:53 AM  Follow-up for Phone Call        No.We no longer recommend using hormone replacement therapy b/c side effect risk. Follow-up by: Beverley Fiedler MD,  September 04, 2007 7:29 PM  Additional Follow-up for Phone Call Additional follow up Details #1::        PATIENT WAS INFORMED OF MESSAGE  FROM DR Barbaraann Barthel. Additional Follow-up by: Leodis Rains,  September 05, 2007 8:56 AM

## 2010-10-18 NOTE — Assessment & Plan Note (Signed)
Summary: 3 MONTH FU FOR CPP///KT   Vital Signs:  Patient profile:   55 year old female Height:      62 inches Weight:      260 pounds BMI:     47.73 Temp:     97.9 degrees F oral Pulse rate:   87 / minute Pulse rhythm:   regular Resp:     18 per minute BP sitting:   135 / 84  (left arm) Cuff size:   l;  Vitals Entered By: Arm[place] Shannon (October 22, 2009 10:57 AM) CC: cpp, Hypertension Management Is Patient Diabetic? Yes Pain Assessment Patient in pain? no      CBG Result 143  Does patient need assistance? Functional Status Self care Ambulation Normal   CC:  cpp and Hypertension Management.  History of Present Illness: Here for CPP. S/p TAH.  No recent vaginal smear. Had fibroids. No h/o abnormal pap. No bleeding, discharge or odor. Mammo done last month. No FHx of breast or ovarian cancer. Had colo in 2010.  Not taking calcium.   Asthma History    Asthma Control Assessment:    Age range: 12+ years    Symptoms: 0-2 days/week    Nighttime Awakenings: 0-2/month    Interferes w/ normal activity: no limitations    SABA use (not for EIB): 0-2 days/week    Asthma Control Assessment: Well Controlled  Hypertension History:      She denies headache, chest pain, dyspnea with exertion, peripheral edema, and syncope.  She notes no problems with any antihypertensive medication side effects.        Positive major cardiovascular risk factors include female age 39 years old or older, diabetes, and hypertension.  Negative major cardiovascular risk factors include negative family history for ischemic heart disease and non-tobacco-user status.       Problems Prior to Update: 1)  Urinalysis, Abnormal  (ICD-791.9) 2)  Constipation, Chronic  (ICD-564.09) 3)  Eustachian Tube Dysfunction, Right  (ICD-381.81) 4)  Lom  (ICD-382.9) 5)  Hypertension  (ICD-401.9) 6)  Unspecified Breast Screening  (ICD-V76.10) 7)  Essential Hypertension, Benign  (ICD-401.1) 8)  Plantar  Fasciitis, Right  (ICD-728.71) 9)  Arthroscopy, Right Knee, Hx of  (ICD-V45.89) 10)  Osteoarthritis, Knee, Left  (ICD-715.96) 11)  Hair Loss  (ICD-704.00) 12)  Elevated Bp Reading Without Dx Hypertension  (ICD-796.2) 13)  Other Abnormal Blood Chemistry  (ICD-790.6) 14)  Hypothyroidism  (ICD-244.9) 15)  Examination, Routine Medical  (ICD-V70.0) 16)  Asthma  (ICD-493.90) 17)  Diabetes Mellitus, Type II  (ICD-250.00) 18)  Need Prophylactic Vaccination&inoculation Flu  (ICD-V04.81)  Current Medications (verified): 1)  Levothroid 50 Mcg  Tabs (Levothyroxine Sodium) .... Take 1 Tablet By Mouth Once A Day For Thyroid 2)  Glucometer and Diabetic Supplies. .... Check Blood Sugar Two Times A Day and Record  Dx=250.0. Not On Insulin 3)  Glucometer Lancets and Strips .... Check Cbg Two Times A Day and Record Dx=250.0 Has High Sugars 4)  Diabetic Tennis Shoes .... Has Pre-Ulcer Calluses Diabetic 5)  Lisinopril 10 Mg  Tabs (Lisinopril) .... Take 1 Tab By Mouth Each Morning. 6)  Proventil Hfa 108 (90 Base) Mcg/act Aers (Albuterol Sulfate) .... Use 2 Puffs Every 4-6 Hours As Needed For Wheezing 7)  Travatan 0.004 % Soln (Travoprost) .... One Drop Into Each Eye Every Morning 8)  Metformin Hcl 500 Mg Tabs (Metformin Hcl) .... Take Two Tabs By Mouth in The Morning 9)  Loratadine 10 Mg Tabs (Loratadine) .Marland Kitchen.. 1 Tab  By Mouth Daily 10)  Fluticasone Propionate 50 Mcg/act Susp (Fluticasone Propionate) .... 2 Sprays Each Nostril Daily 11)  Amitiza 24 Mcg Caps (Lubiprostone) .... Take 1 Capsule By Mouth Two Times A Day  Allergies (verified): 1)  ! Penicillin 2)  ! Coumadin 3)  ! * Latex  Past History:  Past Medical History: Last updated: 08/10/2009 Diabetes mellitus, type II (diet-controlled). 01/2002.Starts oral medicine 1/09. Asthma Hypothyroidism Hypertension (08/18/2008) Chronic Constipation Right foot plantar fasciitis Glaucoma suspect (on Travatan) Osteoarthritis bilat knees   a.  has knee  injection q 3 mos with ortho.  Past Surgical History: Last updated: 08/10/2009 s/p right knee replacement 04/17/2000 and then revision 04/17/2006 s/p cataract surgery bilaterally 10/07 s/p total hysterectomy (TAH/BSO) 1983 s/p thyroidectomy,1973 for over-active thyroid. s/p left knee arthroscopy; distant  Family History: Reviewed history from 08/10/2009 and no changes required. Mother living has ?MS, DM,HTN Father living DM,HTN,glaucoma,alzheimers, s/p leg amp.  No known cardiac  or strokes in family. Sister died liver cancer age 75. Sister died "cancer" age 14.  Social History: Reviewed history from 10/15/2007 and no changes required. Occupation: on disability;prior CNA H/o disability for knee replacements Single Former Smoker Alcohol use-no Drug use-no Completed GED 1998  Review of Systems  The patient denies fever, weight loss, prolonged cough, headaches, melena, hematochezia, hematuria, and depression.         See HPI   Physical Exam  General:  alert, well-developed, and well-nourished.   Head:  normocephalic and atraumatic.   Eyes:  pupils equal, pupils round, pupils reactive to light, and no retinal abnormalitiies.   Ears:  R ear normal and L ear normal.   Nose:  no external deformity.   Mouth:  pharynx pink and moist, no erythema, and no exudates.   Neck:  supple, no thyromegaly, no carotid bruits, and no cervical lymphadenopathy.   Breasts:  skin/areolae normal, no masses, no abnormal thickening, no nipple discharge, no tenderness, and no adenopathy.   Lungs:  normal breath sounds, no crackles, and no wheezes.   Heart:  normal rate, regular rhythm, and no murmur.   Abdomen:  soft, non-tender, normal bowel sounds, and no hepatomegaly.   Rectal:  no external abnormalities.   Genitalia:  normal introitus, no external lesions, no vaginal discharge, and mucosa pink and moist.  cervix absent   Msk:  normal ROM.   no callus or ulcer formation bilat feet  Pulses:   DP/PT 2+ bilat Extremities:  no edema  Neurologic:  alert & oriented X3, cranial nerves II-XII intact, and strength normal in all extremities.   Skin:  skin tags noted about upper chest Psych:  normally interactive and good eye contact.    Diabetes Management Exam:    Foot Exam (with socks and/or shoes not present):       Sensory-Monofilament:          Left foot: normal          Right foot: normal   Impression & Recommendations:  Problem # 1:  HYPERTENSION (ICD-401.9)  increase lisinopril to 20 recheck bmet in 2-4 weeks  Her updated medication list for this problem includes:    Lisinopril 20 Mg Tabs (Lisinopril) .Marland Kitchen... Take 1 tablet by mouth once a day for blood pressure (pharmacy note dose increase)  Problem # 2:  DIABETES MELLITUS, TYPE II (ICD-250.00)  controlled sees eye doctor on regular basis check cmet and lipids  Her updated medication list for this problem includes:    Lisinopril 20 Mg Tabs (Lisinopril) .Marland KitchenMarland KitchenMarland KitchenMarland Kitchen  Take 1 tablet by mouth once a day for blood pressure (pharmacy note dose increase)    Metformin Hcl 500 Mg Tabs (Metformin hcl) .Marland Kitchen... Take two tabs by mouth in the morning  Problem # 3:  HYPOTHYROIDISM (ICD-244.9) TSH normal in 07/2009  Her updated medication list for this problem includes:    Levothroid 50 Mcg Tabs (Levothyroxine sodium) .Marland Kitchen... Take 1 tablet by mouth once a day for thyroid  Problem # 4:  ASTHMA (ICD-493.90) well controlled  Her updated medication list for this problem includes:    Proventil Hfa 108 (90 Base) Mcg/act Aers (Albuterol sulfate) ..... Use 2 puffs every 4-6 hours as needed for wheezing  Problem # 5:  EXAMINATION, ROUTINE MEDICAL (ICD-V70.0)  check HIV and lipids mammo done colo done  up to date on all vaccinations pap today probably does not need again  Problem # 6:  HAIR LOSS (ICD-704.00) check labs (Free Testosterone, DHEA, RPR) probably hyperandrogenic no real clear good options could consider spironolactone . . .  may not be good with ACE use  Problem # 7:  URINALYSIS, ABNORMAL (ICD-791.9) send for micro and culture  Complete Medication List: 1)  Levothroid 50 Mcg Tabs (Levothyroxine sodium) .... Take 1 tablet by mouth once a day for thyroid 2)  Glucometer and Diabetic Supplies.  .... Check blood sugar two times a day and record  dx=250.0. not on insulin 3)  Glucometer Lancets and Strips  .... Check cbg two times a day and record dx=250.0 has high sugars 4)  Diabetic Tennis Shoes  .... Has pre-ulcer calluses diabetic 5)  Lisinopril 20 Mg Tabs (Lisinopril) .... Take 1 tablet by mouth once a day for blood pressure (pharmacy note dose increase) 6)  Proventil Hfa 108 (90 Base) Mcg/act Aers (Albuterol sulfate) .... Use 2 puffs every 4-6 hours as needed for wheezing 7)  Travatan 0.004 % Soln (Travoprost) .... One drop into each eye every morning 8)  Metformin Hcl 500 Mg Tabs (Metformin hcl) .... Take two tabs by mouth in the morning 9)  Loratadine 10 Mg Tabs (Loratadine) .Marland Kitchen.. 1 tab by mouth daily 10)  Fluticasone Propionate 50 Mcg/act Susp (Fluticasone propionate) .... 2 sprays each nostril daily 11)  Amitiza 24 Mcg Caps (Lubiprostone) .... Take 1 capsule by mouth two times a day  Other Orders: EKG w/ Interpretation (93000) T-Comprehensive Metabolic Panel ([id number]) T-Lipid Profile (09811-91478) T-HIV Antibody  (Reflex) ([id number]) T-Pap Smear, Thin Prep (57846) KOH/ WET Mount [phone]) T-Urinalysis [phone]) T-Culture, Urine (02725-36644) T- * Misc. Laboratory test [phone]) T-Testosterone, Free and Total [phone]) T-Dehydroepiandrosterone (DHEA) [phone]) T-Syphilis Test (RPR) [phone])  Hypertension Assessment/Plan:      The patient's hypertensive risk group is category C: Target organ damage and/or diabetes.  Her calculated 10 year risk of coronary heart disease is 20 %.  Today's blood pressure is 135/84.  Her blood pressure goal is < 130/80.  Patient Instructions: 1)   Return for blood pressure check in 2-4 weeks and check bmet (401.1) 2)  Schedule appointment in next 3-4 weeks for skin tag removal.  Will need 30 min. slot. Prescriptions: LISINOPRIL 20 MG TABS (LISINOPRIL) Take 1 tablet by mouth once a day for blood pressure (pharmacy note dose increase)  #90 x 3   Entered and Authorized by:   Tereso Newcomer PA-C   Signed by:   Tereso Newcomer PA-C on 10/22/2009   Method used:   Electronically to        [facility] [phone]* (retail)  [address]       [place], [place]  [address]       Ph: [phone]       Fax: [phone]   RxID:   [id number]   Laboratory Results   Urine Tests  Date/Time Received: October 22, 2009 11:11 AM   Routine Urinalysis   Glucose: negative   (Normal Range: Negative) Bilirubin: negative   (Normal Range: Negative) Ketone: trace (5)   (Normal Range: Negative) Spec. Gravity: >=1.030   (Normal Range: 1.003-1.035) Blood: moderate   (Normal Range: Negative) pH: 5.5   (Normal Range: 5.0-8.0) Protein: trace   (Normal Range: Negative) Urobilinogen: 0.2   (Normal Range: 0-1) Nitrite: negative   (Normal Range: Negative) Leukocyte Esterace: trace   (Normal Range: Negative)     Blood Tests   Date/Time Received: October 22, 2009 11:32 AM   HGBA1C: 6.8%   (Normal Range: Non-Diabetic - 3-6%   Control Diabetic - 6-8%) CBG Random:: 143mg /dL    Wet Mount Source: vaginal WBC/hpf: 1-5 Bacteria/hpf: rare Clue cells/hpf: none  Negative whiff Yeast/hpf: none Wet Mount KOH: Negative Trichomonas/hpf: none      Last LDL:                                                 101 (08/18/2008 10:51:00 PM)        Diabetic Foot Exam Last Podiatry Exam Date: 10/22/2009  Foot Inspection Is there a history of a foot ulcer?              No Is there a foot ulcer now?              No Is there swelling or an abnormal foot shape?          No Are the toenails long?                No Are the toenails thick?                 No Are the toenails ingrown?              No Is there heavy callous build-up?              No Is there a claw toe deformity?                          No Is there elevated skin temperature?            No Is there limited ankle dorsiflexion?            No Is there foot or ankle muscle weakness?            No Do you have pain in calf while walking?           No         10-g (5.07) Semmes-Weinstein Monofilament Test Performed by: Arm[place] Shannon          Right Foot          Left Foot Visual Inspection               Test Control      normal         normal Site 1         normal  normal Site 2         normal         normal Site 3         normal         normal Site 4         normal         normal Site 5         normal         normal Site 6         normal         normal Site 7         normal         normal Site 8         normal         normal Site 9         normal         normal Site 10         normal         normal  Impression      normal         normal    EKG  Procedure date:  10/22/2009  Findings:      NSR HR 76 normal axis Qs V1-2 nonspecific ST-TW changes     Appended Document: 3 MONTH FU FOR CPP///KT   Vital Signs:  Patient profile:   55 year old female O2 Sat:      96 % on Room air  Vitals Entered By: Arm[place] Shannon (October 22, 2009 2:37 PM)  O2 Flow:  Room air  Comments PF 1.   330     2. 410       3. 420   Appended Document: 3 MONTH FU FOR CPP///KT Patient: Kayla Bean Note: All result statuses are Final unless otherwise noted.  Tests: (1) Culture, Urine ([address]) ! COLONY COUNT:       RESULT: 15,000 COLONIES/ML ! FINAL REPORT       RESULT: Multiple bacterial morphotypes present, none ! FINAL REPORT       RESULT: predominant. Suggest appropriate recollection if ! FINAL REPORT       RESULT: clinically indicated.  Note: An exclamation mark (!) indicates a result that was not dispersed into the flowsheet. Document Creation Date:  10/24/2009 11:48 AM _______________________________________________________________________  (1) Order result status: Final Collection or observation date-time: 10/23/2009 00:30 Requested date-time: 10/22/2009 12:37 Receipt date-time: 10/23/2009 00:30 Reported date-time: 10/24/2009 11:48 Referring Physician:   Ordering Physician:  Alben Spittle [phone]) Specimen Source: cc Source: Lajean Silvius Order Number: [id number] Lab site: [place]     [address]     [place]     Signed by Tereso Newcomer PA-C on 10/25/2009 at 8:19 AM  ________________________________________________________________________ urine microscopic negative culture negative have patient repeat u/a in 4 weeks   Clinical Lists Changes  Appended Document: 3 MONTH FU FOR CPP///KT Phone Note Other Incoming   Caller: [facility] Summary of Call: [facility] CALLED AND SAYS THAT THE DHEA WASNT ENOUGH SERUM AND IF YOU ARE GOING TO RECOLLECT IT, DO IT IN [place] Initial call taken by: Leodis Rains,  October 26, 2009 2:58 PM  Follow-up for Phone Call        pt has lab appt on the 18th... can this wait until the 18th of Feb or would you like for me to call pt to reschedule sooner Follow-up by: [place]  Carollee Herter,  October 26, 2009 4:11 PM  Additional Follow-up for Phone Call Additional follow up Details #1::        In looking at her case, the medicine that she would take if her levels were high is not one I would really want her on at the doses that are rec. So we can cancel the test and let the patient know why. She did not seem too interested in taking that medicine when I saw her at her CPP anyway. Let me know if she has any ques. Additional Follow-up by: Tereso Newcomer PA-C,  October 26, 2009 4:51 PM

## 2010-10-18 NOTE — Letter (Signed)
Summary: [facility]   Imported By: Arta Bruce 09/16/2007 12:38:09  _____________________________________________________________________  External Attachment:    Type:   Image     Comment:   External Document

## 2010-10-18 NOTE — Progress Notes (Signed)
  Phone Note Call from Patient Call back at [facility] Phone [phone] Call back at [phone]   Summary of Call: The pt needs refills from hydrocloride (water pills) by today if that is possible because she is out.  Also she eeeds to request for 90 pills rather than 30 pills.  [facility] [address]) Alben Spittle PA-c Initial call taken by: Manon Hilding,  Jan 17, 2010 8:26 AM  Follow-up for Phone Call        forward to provider Follow-up by: [place] Shannon,  Jan 17, 2010 9:15 AM  Additional Follow-up for Phone Call Additional follow up Details #1::        Left message on answering machine for pt to call back.Marland KitchenMarland KitchenArmenia Shannon  Jan 17, 2010 4:20 PM     Additional Follow-up for Phone Call Additional follow up Details #2::    pt is aware Follow-up by: [place] Shannon,  Jan 17, 2010 5:36 PM  Prescriptions: HYDROCHLOROTHIAZIDE 12.5 MG TABS (HYDROCHLOROTHIAZIDE) take one tablet by mouth daily.  #90 x 3   Entered and Authorized by:   Tereso Newcomer PA-C   Signed by:   Tereso Newcomer PA-C on 01/17/2010   Method used:   Electronically to        [facility] [phone]* (retail)       [address]       [place], [place]  [address]       Ph: [phone]       Fax: [phone]   RxID:   [id number]

## 2010-10-18 NOTE — Progress Notes (Signed)
  Phone Note Call from Patient Call back at [facility] Phone [phone]   Caller: Patient Summary of Call: The patient needs more refills from lancetx and from her test strips. [facility] [phone] Dr. Barbaraann Barthel  Initial call taken by: Manon Hilding,  Jan 27, 2008 3:18 PM  Follow-up for Phone Call        Cma to call patient that MD refilled. Follow-up by: Beverley Fiedler MD,  Jan 28, 2008 6:10 PM  Additional Follow-up for Phone Call Additional follow up Details #1::        unable to leave message with patient due to something wrong with phone. Additional Follow-up by: Mikey College CMA,  Jan 29, 2008 12:16 PM    Additional Follow-up for Phone Call Additional follow up Details #2::    left message to return call....Marland KitchenMarland KitchenMikey College CMA  Jan 31, 2008 12:20 PM   pt notified.Marland KitchenMarland KitchenMikey College CMA  Jan 31, 2008 12:47 PM   New/Updated Medications: * GLUCOMETER LANCETS AND STRIPS Check CBG two times a day and record Dx=250.0 has high sugars   Prescriptions: GLUCOMETER LANCETS AND STRIPS Check CBG two times a day and record Dx=250.0 has high sugars  #60 per month x 11   Entered and Authorized by:   Beverley Fiedler MD   Signed by:   Beverley Fiedler MD on 01/28/2008   Method used:   Printed then faxed to ...         RxID:   [id number]

## 2010-10-18 NOTE — Assessment & Plan Note (Signed)
Summary: F/u ? Lipoma   Vital Signs:  Patient profile:   55 year old female Weight:      258.0 pounds BMI:     47.36 Temp:     97.7 degrees F oral Pulse rate:   72 / minute Pulse rhythm:   regular Resp:     16 per minute BP sitting:   126 / 80  (left arm) Cuff size:   large  Vitals Entered By: Levon Hedger (August 23, 2010 11:34 AM)  Nutrition Counseling: Patient's BMI is greater than 25 and therefore counseled on weight management options. CC: follow-up visit...boil on back of neck, Hypertension Management Is Patient Diabetic? No Pain Assessment Patient in pain? no      CBG Result 93 CBG Device ID B  Does patient need assistance? Functional Status Self care Ambulation Normal   CC:  follow-up visit...boil on back of neck and Hypertension Management.  History of Present Illness:  Pt into the office to recheck area on right neck She was seen in this office about 3 weeks ago and she was advised to place warm compresses to affected area.  She has done this and the area has not decreased in size Admits that right neck sometimes gets stiff and is concerned that it may be due to this area  Hypertension History:      She denies headache, chest pain, and palpitations.  She notes no problems with any antihypertensive medication side effects.        Positive major cardiovascular risk factors include female age 43 years old or older, diabetes, and hypertension.  Negative major cardiovascular risk factors include negative family history for ischemic heart disease and non-tobacco-user status.        Further assessment for target organ damage reveals no history of ASHD, cardiac end-organ damage (CHF/LVH), stroke/TIA, peripheral vascular disease, renal insufficiency, or hypertensive retinopathy.     Allergies (verified): 1)  ! Penicillin 2)  ! Coumadin 3)  ! * Latex  Review of Systems CV:  Denies chest pain or discomfort. Resp:  Denies cough. GI:  Denies abdominal pain,  vomiting, and vomiting blood.  Physical Exam  General:  alert.   Head:  normocephalic.   Neck:  tenderness with palpation of trapezius Msk:  normal ROM.     Neurologic:  alert & oriented X3.   Skin:  color normal.   right trapezius - 6cm x 5cm palpable, nontender tissue Psych:  Oriented X3.     Impression & Recommendations:  Problem # 1:  LIPOMA (ICD-214.9) Area is increasing in size will send for ultrasound Orders: Ultrasound (Ultrasound)  Problem # 2:  DIABETES MELLITUS, TYPE II (ICD-250.00) doing well continue current meds Her updated medication list for this problem includes:    Lisinopril 20 Mg Tabs (Lisinopril) .Marland Kitchen... Take 1 tablet by mouth once a day for blood pressure (pharmacy note dose increase)    Metformin Hcl 500 Mg Tabs (Metformin hcl) .Marland Kitchen... Take two tabs by mouth in the morning    Aspirin 81 Mg Tabs (Aspirin) ..... One tablet by mouth daily  Orders: Capillary Blood Glucose/CBG ([address])  Problem # 3:  HYPERTENSION (ICD-401.9) Blood pressure better today  Her updated medication list for this problem includes:    Lisinopril 20 Mg Tabs (Lisinopril) .Marland Kitchen... Take 1 tablet by mouth once a day for blood pressure (pharmacy note dose increase)    Hydrochlorothiazide 12.5 Mg Tabs (Hydrochlorothiazide) .Marland Kitchen... Take one tablet by mouth daily.  Hypertension Assessment/Plan:  The patient's hypertensive risk group is category C: Target organ damage and/or diabetes.  Her calculated 10 year risk of coronary heart disease is > 32 %.  Today's blood pressure is 126/80.  Her blood pressure goal is < 130/80.  Patient Instructions: 1)  You will be scheduled for an ultrasound of your right neck to see if the area of concern is a lipoma or cyst 2)  You will be notified of the results once the provider reviews them. 3)  Continue taking your medications as ordered 4)  Blood pressure is doing better today. 5)  Your mammogram will be due in January.  This office will be glad to  schedule for you at that time   Orders Added: 1)  Capillary Blood Glucose/CBG [82948] 2)  Est. Patient Level III [[address]] 3)  Ultrasound [Ultrasound]

## 2010-10-18 NOTE — Progress Notes (Signed)
Summary: ear pain///[facility] pt  Phone Note Call from Patient Call back at [facility] Phone [phone]   Summary of Call: pt c/o rt ear pain since Tuesday and today is worse. She has taking otc meds but at this time there is no relief Initial call taken by: Mikey College CMA,  October 16, 2008 8:43 AM  Follow-up for Phone Call        Per Dr. Delrae Alfred she can come and she will quickly look at only her right ear and nothing else. Follow-up by: Vesta Mixer CMA,  October 16, 2008 8:54 AM  Additional Follow-up for Phone Call Additional follow up Details #1::        there was a  cancellation and was able to schedule appt for pt Additional Follow-up by: Mikey College CMA,  October 16, 2008 9:06 AM

## 2010-10-18 NOTE — Assessment & Plan Note (Signed)
Summary: Diabetes   Vital Signs:  Patient profile:   55 year old female Weight:      255.4 pounds BMI:     46.88 Temp:     98.6 degrees F oral Pulse rate:   88 / minute Pulse rhythm:   regular Resp:     20 per minute BP sitting:   124 / 86  (left arm) Cuff size:   large  Vitals Entered By: Levon Hedger (July 26, 2010 10:39 AM)  Nutrition Counseling: Patient's BMI is greater than 25 and therefore counseled on weight management options. CC: right shoulder swollen and causing neck pain with a little sorethroat x 2 -3 months ago..cough x 2 weeks...need med refills, Hypertension Management Is Patient Diabetic? Yes Pain Assessment Patient in pain? no      CBG Result 156 CBG Device ID B  Does patient need assistance? Functional Status Self care Ambulation Normal   CC:  right shoulder swollen and causing neck pain with a little sorethroat x 2 -3 months ago..cough x 2 weeks...need med refills and Hypertension Management.  History of Present Illness:  Pt into the office for diabetes and right shoulder pain.  Pt presents today with all her medications  Social - mother is in a nursing facility and father is at home with her brother.  Pt is concerned about their care.  Diabetes Management History:      The patient is a 55 years old female who comes in for evaluation of DM Type 2.  She has not been enrolled in the "Diabetic Education Program".  She states understanding of dietary principles and is following her diet appropriately.  No sensory loss is reported.  Self foot exams are not being performed.  She is checking home blood sugars.  She says that she is not exercising regularly.        Hypoglycemic symptoms are not occurring.  No hyperglycemic symptoms are reported.    Hypertension History:      She denies headache, chest pain, and palpitations.  She notes no problems with any antihypertensive medication side effects.        Positive major cardiovascular risk factors  include female age 47 years old or older, diabetes, and hypertension.  Negative major cardiovascular risk factors include negative family history for ischemic heart disease and non-tobacco-user status.     Allergies (verified): 1)  ! Penicillin 2)  ! Coumadin 3)  ! * Latex  Review of Systems General:  Denies fever. CV:  Denies chest pain or discomfort. Resp:  Denies cough. GI:  Denies abdominal pain, nausea, and vomiting. MS:  Complains of joint pain.  Physical Exam  General:  alert.  obses Head:  normocephalic.   Eyes:  exophthalmoses.   Ears:  bil TM with clear fluid - no erythema Lungs:  normal breath sounds.   Heart:  normal rate and regular rhythm.   Neurologic:  alert & oriented X3.   Skin:  color normal.   right trapezius - 6cm x 4 cm palpable, nontender tissue Psych:  Oriented X3.    Diabetes Management Exam:    Foot Exam (with socks and/or shoes not present):       Sensory-Monofilament:          Left foot: normal          Right foot: normal   Impression & Recommendations:  Problem # 1:  DIABETES MELLITUS, TYPE II (ICD-250.00) stable continue current meds Her updated medication list for this problem  includes:    Lisinopril 20 Mg Tabs (Lisinopril) .Marland Kitchen... Take 1 tablet by mouth once a day for blood pressure (pharmacy note dose increase)    Metformin Hcl 500 Mg Tabs (Metformin hcl) .Marland Kitchen... Take two tabs by mouth in the morning    Aspirin 81 Mg Tabs (Aspirin) ..... One tablet by mouth daily  Orders: Capillary Blood Glucose/CBG (09811) UA Dipstick w/o Micro (manual) (91478) T-Urine Microalbumin w/creat. ratio [phone])  Problem # 2:  ESSENTIAL HYPERTENSION, BENIGN (ICD-401.1) BP is doing well Her updated medication list for this problem includes:    Lisinopril 20 Mg Tabs (Lisinopril) .Marland Kitchen... Take 1 tablet by mouth once a day for blood pressure (pharmacy note dose increase)    Hydrochlorothiazide 12.5 Mg Tabs (Hydrochlorothiazide) .Marland Kitchen... Take one tablet by  mouth daily.  Problem # 3:  NEED PROPHYLACTIC VACCINATION&INOCULATION FLU (ICD-V04.81) given today in office  Problem # 4:  HYPOTHYROIDISM (ICD-244.9) will check labs on next visit Her updated medication list for this problem includes:    Levothroid 50 Mcg Tabs (Levothyroxine sodium) .Marland Kitchen... Take 1 tablet by mouth once a day for thyroid  Complete Medication List: 1)  Levothroid 50 Mcg Tabs (Levothyroxine sodium) .... Take 1 tablet by mouth once a day for thyroid 2)  Glucometer and Diabetic Supplies.  .... Check blood sugar two times a day and record  dx=250.0. not on insulin 3)  Glucometer Lancets and Strips  .... Check cbg two times a day and record dx=250.0 has high sugars 4)  Diabetic Tennis Shoes  .... Has pre-ulcer calluses diabetic 5)  Lisinopril 20 Mg Tabs (Lisinopril) .... Take 1 tablet by mouth once a day for blood pressure (pharmacy note dose increase) 6)  Proventil Hfa 108 (90 Base) Mcg/act Aers (Albuterol sulfate) .... Use 2 puffs every 4-6 hours as needed for wheezing 7)  Travatan 0.004 % Soln (Travoprost) .... One drop into each eye every morning 8)  Metformin Hcl 500 Mg Tabs (Metformin hcl) .... Take two tabs by mouth in the morning 9)  Amitiza 24 Mcg Caps (Lubiprostone) .... Take 1 capsule by mouth two times a day 10)  Pravachol 20 Mg Tabs (Pravastatin sodium) .... Take 1 tab by mouth at bedtime for cholesterol 11)  Hydrochlorothiazide 12.5 Mg Tabs (Hydrochlorothiazide) .... Take one tablet by mouth daily. 12)  Aspirin 81 Mg Tabs (Aspirin) .... One tablet by mouth daily 13)  Loratadine 10 Mg Tabs (Loratadine) .... One tablet by mouth daily for allergies  Other Orders: Flu Vaccine 51yrs + (69629) Admin 1st Vaccine (52841)  Diabetes Management Assessment/Plan:      Her blood pressure goal is < 130/80.    Hypertension Assessment/Plan:      The patient's hypertensive risk group is category C: Target organ damage and/or diabetes.  Her calculated 10 year risk of coronary heart  disease is > 32 %.  Today's blood pressure is 124/86.  Her blood pressure goal is < 130/80.  Patient Instructions: 1)  Schedule a lab visit after August 11, 2010 for TSH and CBC. 2)  Diabetes - Your Hgba1c = 6.8. Diabetes is doing well. Continue current medications 3)  Right shoulder - ? lipoma (read handout) 4)  apply warm compresses at least twice per day.  If it is truely a lipoma it will not decrease in size as this is fatty tissue 5)  Cough - Use your inhaler during the day and at night for the next 3 days.  Also start lorantadine 10mg  by mouth daily to help with  nasal congestion. 6)  Follow up in 2-3 weeks to assess lipoma.  If still present will need ultrasound Prescriptions: LORATADINE 10 MG TABS (LORATADINE) One tablet by mouth daily for allergies  #30 x 1   Entered and Authorized by:   Lehman Prom FNP   Signed by:   Lehman Prom FNP on 07/26/2010   Method used:   Print then Give to Patient   RxID:   [id number] PROVENTIL HFA 108 (90 BASE) MCG/ACT AERS (ALBUTEROL SULFATE) Use 2 puffs every 4-6 hours as needed for wheezing  #1 x 1   Entered and Authorized by:   Lehman Prom FNP   Signed by:   Lehman Prom FNP on 07/26/2010   Method used:   Print then Give to Patient   RxID:   [id number]    Orders Added: 1)  Capillary Blood Glucose/CBG [29518] 2)  Flu Vaccine 40yrs + [84166] 3)  Admin 1st Vaccine [90471] 4)  Est. Patient Level III [06301] 5)  UA Dipstick w/o Micro (manual) [81002] 6)  T-Urine Microalbumin w/creat. ratio [[id number]]   Immunizations Administered:  Influenza Vaccine # 1:    Vaccine Type: Fluvax 3+    Site: left deltoid    Mfr: [email or web address]    Dose: 0.5 ml    Route: IM    Given by: Levon Hedger    Exp. Date: 03/18/2011    Lot #: [id number]    VIS given: 04/12/10 version given July 26, 2010.  Flu Vaccine Consent Questions:    Do you have a history of severe allergic reactions to this vaccine? no    Any prior  history of allergic reactions to egg and/or gelatin? no    Do you have a sensitivity to the preservative Thimersol? no    Do you have a past history of Guillan-Barre Syndrome? no    Do you currently have an acute febrile illness? no    Have you ever had a severe reaction to latex? yes    Vaccine information given and explained to patient? yes    Are you currently pregnant? no  Diabetic Foot Exam Last Podiatry Exam Date: 10/22/2009 Foot Inspection Is there a history of a foot ulcer?              No Is there a foot ulcer now?              No Can the patient see the bottom of their feet?          No Are the shoes appropriate in style and fit?          No Is there swelling or an abnormal foot shape?          No Are the toenails long?                Yes Are the toenails thick?                Yes Are the toenails ingrown?              No Is there heavy callous build-up?              No Is there pain in the calf muscle (Intermittent claudication) when walking?    NoIs there a claw toe deformity?              Yes Is there elevated skin temperature?            Yes Is there limited ankle dorsiflexion?  Yes Is there foot or ankle muscle weakness?            Yes  Diabetic Foot Care Education Patient educated on appropriate care of diabetic feet.  Pulse Check          Right Foot          Left Foot Dorsalis Pedis:        normal            normal    10-g (5.07) Semmes-Weinstein Monofilament Test Performed by: Levon Hedger          Right Foot          Left Foot Visual Inspection                 Immunizations Administered:  Influenza Vaccine # 1:    Vaccine Type: Fluvax 3+    Site: left deltoid    Mfr: GlaxoSmithKline    Dose: 0.5 ml    Route: IM    Given by: Levon Hedger    Exp. Date: 03/18/2011    Lot #: [id number]    VIS given: 04/12/10 version given July 26, 2010.   Last LDL:                                                 108 (10/22/2009 9:38:00 PM)           Diabetic Foot Exam Last Podiatry Exam Date: 10/22/2009 Diabetic Foot Care Education :Patient educated on appropriate care of diabetic feet.  Pulse Check          Right Foot          Left Foot Dorsalis Pedis:        normal            normal    10-g (5.07) Semmes-Weinstein Monofilament Test Performed by: Levon Hedger          Right Foot          Left Foot Visual Inspection               Test Control      normal         normal Site 1         normal         normal Site 2         normal         normal Site 3         normal         normal Site 4         normal         normal Site 5         normal         normal Site 6         normal         normal Site 7         normal         normal Site 8         normal         normal Site 9         normal         normal Site 10         normal         normal  Impression      normal         normal      Laboratory Results   Urine Tests  Date/Time Received: July 26, 2010 11:08 AM   Routine Urinalysis   Color: lt. yellow Appearance: Clear Glucose: negative   (Normal Range: Negative) Bilirubin: negative   (Normal Range: Negative) Ketone: negative   (Normal Range: Negative) Spec. Gravity: 1.025   (Normal Range: 1.003-1.035) Blood: trace-lysed   (Normal Range: Negative) pH: 5.0   (Normal Range: 5.0-8.0) Protein: negative   (Normal Range: Negative) Urobilinogen: 0.2   (Normal Range: 0-1) Nitrite: negative   (Normal Range: Negative) Leukocyte Esterace: negative   (Normal Range: Negative)     Blood Tests   Date/Time Received: July 26, 2010 11:08 AM   HGBA1C: 6.8%   (Normal Range: Non-Diabetic - 3-6%   Control Diabetic - 6-8%) CBG Random:: 156     Prevention & Chronic Care Immunizations   Influenza vaccine: Fluvax 3+  (07/26/2010)    Tetanus booster: 01/18/2009: Tdap    Pneumococcal vaccine: Pneumovax  (01/18/2009)  Colorectal Screening   Hemoccult: Not documented    Colonoscopy: polyp  (03/16/2009)    Colonoscopy action/deferral: Further recommendations pending biopsy results.  Repeat colonoscopy in 5 years.   (03/16/2009)   Colonoscopy due: 03/2014  Other Screening   Pap smear: NEGATIVE FOR INTRAEPITHELIAL LESIONS OR MALIGNANCY.  (10/22/2009)    Mammogram: ASSESSMENT: Negative - BI-RADS 1^MM DIGITAL SCREENING  (09/22/2009)   Smoking status: quit  (09/04/2007)  Diabetes Mellitus   HgbA1C: 6.8  (07/26/2010)    Eye exam: Per patient (sees Dr. Hanley Seamen)  (02/16/2009)    Foot exam: yes  (07/26/2010)   High risk foot: Not documented   Foot care education: Done  (07/26/2010)    Urine microalbumin/creatinine ratio: 1.9  (08/18/2008)  Lipids   Total Cholesterol: 162  (10/22/2009)   LDL: 108  (10/22/2009)   LDL Direct: Not documented   HDL: 33  (10/22/2009)   Triglycerides: 103  (10/22/2009)  Hypertension   Last Blood Pressure: 124 / 86  (07/26/2010)   Serum creatinine: 1.13  (12/07/2009)   Serum potassium 4.1  (12/07/2009)  Self-Management Support :    Diabetes self-management support: Not documented    Hypertension self-management support: Not documented   Nursing Instructions: Give Flu vaccine today

## 2010-10-18 NOTE — Assessment & Plan Note (Signed)
Summary: 2 MONTH FU////KT   Vital Signs:  Patient Profile:   55 Years Old Female Height:     62 inches Weight:      272 pounds BMI:     49.93 Temp:     97.8 degrees F Pulse rate:   84 / minute Pulse rhythm:   regular Resp:     18 per minute BP sitting:   142 / 84  (left arm) Cuff size:   large  Pt. in pain?   no  Vitals Entered By: Vesta Mixer CMA (December 16, 2007 10:33 AM)              Is Patient Diabetic? Yes  CBG Result 153  Does patient need assistance? Ambulation Normal     Chief Complaint:  2 month f/u dm and wants to know if it is ok to take b12 supplements.  History of Present Illness: Here for f/u on Diabetes. Has been taking metformin once a day without a problem. Has not had any problems with her meds. Is not checking her blood sugar as does not have glucometer.  Weight unchanged from last visit at 272 lbs.  Has alot of night sweats. Had hysterectomy >30 years ago.    Worried about hair loss;has been on-going problem for 10+ years. Is taking OTC B12 vitamins.    Prior Medications Reviewed Using: Medication Bottles  Current Allergies: ! PENICILLIN ! COUMADIN ! * LATEX  Past Medical History:    Reviewed history from 10/15/2007 and no changes required:       Diabetes mellitus, type II (diet-controlled). 01/2002.Starts oral medicine 1/09.       Asthma  Past Surgical History:    Reviewed history from 09/04/2007 and no changes required:       s/p right knee replacement 04/17/2000 and then revision 04/17/2006       s/p cataract surgery bilaterally 10/07       s/p total hysterectomy 1983       s/p thyroidectomy,1973 for over-active thyroid.       s/p left knee arthroscopy; distant      Physical Exam  General:     Well-developed,well-nourished,in no acute distress; alert,appropriate and cooperative throughout examination Head:     Wears wig.Has hair loss in frontotemporal and top of head. This is a gradual process over several years per  patient. Neck:     supple, no masses, and no thyromegaly.   Lungs:     Normal respiratory effort, chest expands symmetrically. Lungs are clear to auscultation, no crackles or wheezes. Heart:     Normal rate and regular rhythm. S1 and S2 normal without gallop, murmur, click, rub or other extra sounds.    Impression & Recommendations:  Problem # 1:  DIABETES MELLITUS, TYPE II (ICD-250.00) HgA1c is 7.2%;slightly improved from 7.8% in 12/08. Still needs tighter control. Pt is accepting of medication need and not tearful about medication at today's visit. MD also d/w her that she would benefit from an [facility] ASA 81 mg once daily and we discussed the benefits of a statin medication given her diabetes. Pt to consider and will re-address with her at f/u visit in 2 months. She is OK with increasing her metformin to try and get HgA1c under 7. Her updated medication list for this problem includes:    Glucophage Xr 500 Mg Tb24 (Metformin hcl) .Marland Kitchen... Take 2 tablets po  every morning  Orders: Capillary Blood Glucose (95284) Fingerstick (13244) T-Comprehensive Metabolic Panel (01027-25366)   Problem #  2:  OTHER ABNORMAL BLOOD CHEMISTRY (ICD-790.6) Had slightly elevated serum protein. Will check serum  protein electrophoresis. Orders: T-Comprehensive Metabolic Panel [id number]) T-Prot. Elect. (Serum) [id number])   Problem # 3:  HYPOTHYROIDISM (ICD-244.9)  Her updated medication list for this problem includes:    Levothroid 50 Mcg Tabs (Levothyroxine sodium) .Marland Kitchen... Take 1 tablet by mouth once a day for thyroid  Orders: T-Comprehensive Metabolic Panel ([id number]) T-Prot. Elect. (Serum) [phone]) T-TSH [phone])   Problem # 4:  ELEVATED BP READING WITHOUT DX HYPERTENSION (ICD-796.2) MD d/w patient that her BP reading today is slightly elevated compared to her 12/08 value. Benefits of starting an ACEI medication for BP control and reno-protective effect are d/w patient. She will  consider and we will re-address at her f/u appt in 2 months.  Problem # 5:  HAIR LOSS (ICD-704.00) Her chronic condition may be influenced by her hypothyroidism. However, Pt says she has had problems with hair loss in the frontotemporal and top of the head areas for 10+ years. The distribution of hair loss is similar to that found in female pattern baldness and probably is inherited. Pt may try OTC Rogaine for women. Recommended that she try for 4 months but if not improved then stop using.  MD d/w her that it is OK for her to take a single dose of B12 OTC daily but she would probably benefit more from a general B vitamin or daily multiple vitamin...  Complete Medication List: 1)  Glucophage Xr 500 Mg Tb24 (Metformin hcl) .... Take 2 tablets po  every morning 2)  Levothroid 50 Mcg Tabs (Levothyroxine sodium) .... Take 1 tablet by mouth once a day for thyroid 3)  Glucometer and Diabetic Supplies.  .... Check blood sugar two times a day and record  dx=250.0. not on insulin   Patient Instructions: 1)  Please schedule a follow-up appointment in 2 months for DM and thyroid.    Prescriptions: GLUCOMETER AND DIABETIC SUPPLIES. Check BLood sugar two times a day and record  DX=250.0. Not on insulin  #1 x 0   Entered and Authorized by:   Beverley Fiedler MD   Signed by:   Beverley Fiedler MD on 12/16/2007   Method used:   Print then Give to Patient   RxID:   [id number] GLUCOPHAGE XR 500 MG  TB24 (METFORMIN HCL) Take 2 Tablets Po  every morning  #60 x 4   Entered and Authorized by:   Beverley Fiedler MD   Signed by:   Beverley Fiedler MD on 12/16/2007   Method used:   Print then Give to Patient   RxID:   [id number]  ] Laboratory Results   Blood Tests     HGBA1C: 7.2%   (Normal Range: Non-Diabetic - 3-6%   Control Diabetic - 6-8%) CBG Random:: 153

## 2010-10-18 NOTE — Progress Notes (Signed)
Summary: Vaginal irritation/urgent per pt  Phone Note Call from Patient Call back at [phone]   Summary of Call: pt states that she was at the [facility] over the weekend and in pool. She states that day after she was very irritated and tender and was barely able to wipe herself. Due to irritation she is swollen. This makes day #3 and at this point pt is unsure what to use of what to do. Pt uses [facility] if she has to have something called in. Pt states she has not been using any soap since she is so irritated just using water. Initial call taken by: Mikey College CMA,  Jan 26, 2009 11:07 AM  Follow-up for Phone Call        Try OTC antifungal cream(like Monistst) and may also use Desitin to soothe area.Marland KitchenPROVIDER: Beverley Fiedler MD d/w [place] and [place] earlier this afternoon. Follow-up by: Beverley Fiedler MD,  Jan 26, 2009 6:09 PM  Additional Follow-up for Phone Call Additional follow up Details #1::        pt informed. She said that she would give a return call if she did not get any relief. Additional Follow-up by: Levon Hedger,  Jan 26, 2009 6:13 PM

## 2010-10-18 NOTE — Letter (Signed)
Summary: [facility]  [facility]   Imported By: Arta Bruce 04/23/2008 16:32:23  _____________________________________________________________________  External Attachment:    Type:   Image     Comment:   External Document

## 2010-10-18 NOTE — Letter (Signed)
Summary: ORTHOPAEDIC REPORT  ORTHOPAEDIC REPORT   Imported By: Arta Bruce 02/24/2008 15:53:39  _____________________________________________________________________  External Attachment:    Type:   Image     Comment:   External Document

## 2010-10-18 NOTE — Letter (Signed)
Summary: [facility]  [facility]   Imported By: Arta Bruce 04/07/2009 11:40:48  _____________________________________________________________________  External Attachment:    Type:   Image     Comment:   External Document

## 2010-10-18 NOTE — Assessment & Plan Note (Signed)
Summary: EAR PAIN PER DR Barbaraann Barthel / NS   Vital Signs:  Patient Profile:   55 Years Old Female Height:     62 inches Weight:      258 pounds BMI:     47.36 BSA:     2.13 Temp:     97.9 degrees F oral Pulse rate:   76 / minute Pulse rhythm:   regular Resp:     16 per minute BP sitting:   130 / 90  (left arm) Cuff size:   large  Pt. in pain?   yes    Location:   ears    Intensity:   8    Type:       aching,piercing  Vitals Entered By: Levon Hedger (September 01, 2008 3:04 PM)              Is Patient Diabetic? Yes CBG Result 108 CBG Device ID A  Does patient need assistance? Ambulation Normal     Chief Complaint:  ear pain that has been going on since Friday.  History of Present Illness: Left ear x several days. See phone note from yesterday. No fever. Had sorethroat which has improved but ear pain not improving. Having trouble sleeping. Pt has taken amoxicillin in past without problem.    Updated Prior Medication List: pt did not bring medications today  Current Allergies (reviewed today): ! PENICILLIN ! COUMADIN ! * LATEX  Past Medical History:    Reviewed history from 08/18/2008 and no changes required:       Diabetes mellitus, type II (diet-controlled). 01/2002.Starts oral medicine 1/09.       Asthma       Hypothyroidism       Hypertension (08/18/2008)  Past Surgical History:    Reviewed history from 09/04/2007 and no changes required:       s/p right knee replacement 04/17/2000 and then revision 04/17/2006       s/p cataract surgery bilaterally 10/07       s/p total hysterectomy 1983       s/p thyroidectomy,1973 for over-active thyroid.       s/p left knee arthroscopy; distant   Family History:    Reviewed history from 09/04/2007 and no changes required:       Mother living has ?MS, DM,HTN       Father living DM,HTN,glaucoma,alzheimers,              No known cardiac  or strokes in family.       Sister died liver cancer age 57.       Sister  died "cancer" age 22.  Social History:    Reviewed history from 10/15/2007 and no changes required:       Occupation: on disability;prior CNA H/o disability for knee replacements       Single       Former Smoker       Alcohol use-no       Drug use-no       Completed GED 1998   Risk Factors: Tobacco use:  quit    Year quit:  2007 Passive smoke exposure:  yes Drug use:  no HIV high-risk behavior:  no Caffeine use:  1 drinks per day Alcohol use:  no Exercise:  no Seatbelt use:  100 % Sun Exposure:  occasionally  Family History Risk Factors:    Family History of MI in females < 21 years old:  no    Family History of MI in males <  30 years old:  no  Mammogram History:    Date of Last Mammogram:  09/16/2007    Physical Exam  General:     Well-developed,well-nourished,in no acute distress; alert,appropriate and cooperative throughout examination Eyes:     no injection.   Ears:     No drainage.  R TM normal.L TM erythema.   Nose:     External nasal examination shows no deformity or inflammation. Nasal mucosa are pink and moist without lesions or exudates. Mouth:     Oral mucosa and oropharynx without lesions or exudates.  r.pharynx pink and moist.   Neck:     no cervical lymphadenopathy.   Lungs:     Normal respiratory effort, chest expands symmetrically. Lungs are clear to auscultation, no crackles or wheezes. Heart:     Normal rate and regular rhythm. S1 and S2 normal without gallop, murmur, click, rub or other extra sounds.    Impression & Recommendations:  Problem # 1:  LOM (ICD-382.9) Septr DS Take 1 tablet by mouth every 12 hours x 7 days.  Complete Medication List: 1)  Glucophage Xr 500 Mg Tb24 (Metformin hcl) .... Take 2 tablets po  every morning 2)  Levothroid 50 Mcg Tabs (Levothyroxine sodium) .... Take 1 tablet by mouth once a day for thyroid 3)  Glucometer and Diabetic Supplies.  .... Check blood sugar two times a day and record  dx=250.0. not on  insulin 4)  Glucometer Lancets and Strips  .... Check cbg two times a day and record dx=250.0 has high sugars 5)  Diabetic Tennis Shoes  .... Has pre-ulcer calluses diabetic 6)  Eql Stool Softener 100 Mg Caps (Docusate sodium) .... Take one tablet daily 7)  Lisinopril 10 Mg Tabs (Lisinopril) .... Take 1 tab by mouth each morning. 8)  Proventil Hfa 108 (90 Base) Mcg/act Aers (Albuterol sulfate) .... Use 2 puffs every 4-6 hours as needed for wheezing  Other Orders: Capillary Blood Glucose (04540) Fingerstick (98119)   Patient Instructions: 1)  Please schedule a follow-up appointment as needed.   Prescriptions: SEPTRA DS 800-160 MG TABS (SULFAMETHOXAZOLE-TRIMETHOPRIM) 1 by mouth twice daily x 7 days  #14 x 0   Entered and Authorized by:   Beverley Fiedler MD   Signed by:   Beverley Fiedler MD on 09/01/2008   Method used:   Print then Give to Patient   RxID:   [id number]  ]

## 2010-10-18 NOTE — Progress Notes (Signed)
Summary: Constipation/[place] Pt  Phone Note Call from Patient Call back at [facility] Phone [phone]   Summary of Call: pt states was in the office last week and was given some stool softners but she's still constipated. pt states last BM was Saturday. Pt states feels like a lump sitting at her rectum that will not come out. She also c/o lower back pain. Pt states was given meds but nothing is working and not getting any relief. Pt uses [facility] on [address] if you can please call her something in. Initial call taken by: Mikey College CMA,  August 26, 2008 10:45 AM  Follow-up for Phone Call        Instruct pt to get magensium citrate (available OTC) She also needs full liquid/soft foods no need to keep eating full diet if she is having problems with constipation Follow-up by: Lehman Prom FNP,  August 26, 2008 11:27 AM  Additional Follow-up for Phone Call Additional follow up Details #1::        advised pt on the above the information. Also stated to pt to call office back tomorrow afternoon and let me know how she is feeling or doing since the weekend is coming up. Additional Follow-up by: Mikey College CMA,  August 27, 2008 9:09 AM

## 2010-10-18 NOTE — Assessment & Plan Note (Signed)
Summary: 3-4 WEEK FU FOR SKIN TAG REMOVAL///KT   Vital Signs:  Patient profile:   55 year old female Height:      62 inches Weight:      259 pounds BMI:     47.54 Temp:     98.0 degrees F oral Pulse rate:   65 / minute Pulse rhythm:   regular Resp:     18 per minute BP sitting:   148 / 93  (left arm) Cuff size:   regular  Vitals Entered By: [place] Shannon (November 19, 2009 11:36 AM) CC: PT HERE FOR SKIN TAGS AND PT HAS A SMALL BUMP ON HER BACK SHE WOULD LIKE YOU TO LOOK AT..... Is Patient Diabetic? No Pain Assessment Patient in pain? no       Does patient need assistance? Functional Status Self care Ambulation Normal   CC:  PT HERE FOR SKIN TAGS AND PT HAS A SMALL BUMP ON HER BACK SHE WOULD LIKE YOU TO LOOK AT......  History of Present Illness: Here for skin tag removal.  Also, has bump on back that is pruritic.  No pain.  Has been present x 2 years.  No change.  Allergies: 1)  ! Penicillin 2)  ! Coumadin 3)  ! * Latex  Physical Exam  Skin:  multiple skin tags noted under right breast, right neck, left neck, left flank  medium sized sebaceous cyst noted left back; no redness, pain or discharge    Impression & Recommendations:  Problem # 1:  SKIN TAG (ICD-701.9)  under sterile condition, multiple skin tags were removed at sites listed above silver nitrate applied with good hemostasis no apparent complications  Orders: Removal of Skin Tags up to 15 Lesions (11200)  Problem # 2:  SEBACEOUS CYST (ICD-706.2) monitor discuss removal if becomes bothersome  Complete Medication List: 1)  Levothroid 50 Mcg Tabs (Levothyroxine sodium) .... Take 1 tablet by mouth once a day for thyroid 2)  Glucometer and Diabetic Supplies.  .... Check blood sugar two times a day and record  dx=250.0. not on insulin 3)  Glucometer Lancets and Strips  .... Check cbg two times a day and record dx=250.0 has high sugars 4)  Diabetic Tennis Shoes  .... Has pre-ulcer calluses  diabetic 5)  Lisinopril 20 Mg Tabs (Lisinopril) .... Take 1 tablet by mouth once a day for blood pressure (pharmacy note dose increase) 6)  Proventil Hfa 108 (90 Base) Mcg/act Aers (Albuterol sulfate) .... Use 2 puffs every 4-6 hours as needed for wheezing 7)  Travatan 0.004 % Soln (Travoprost) .... One drop into each eye every morning 8)  Metformin Hcl 500 Mg Tabs (Metformin hcl) .... Take two tabs by mouth in the morning 9)  Loratadine 10 Mg Tabs (Loratadine) .Marland Kitchen.. 1 tab by mouth daily 10)  Fluticasone Propionate 50 Mcg/act Susp (Fluticasone propionate) .... 2 sprays each nostril daily 11)  Amitiza 24 Mcg Caps (Lubiprostone) .... Take 1 capsule by mouth two times a day 12)  Pravachol 20 Mg Tabs (Pravastatin sodium) .... Take 1 tab by mouth at bedtime for cholesterol 13)  Pravachol 20 Mg Tabs (Pravastatin sodium) .... Take 1 tab by mouth at bedtime for cholesterol 14)  Diflucan 150 Mg Tabs (Fluconazole) .... Take one tablet 15)  Hydrochlorothiazide 12.5 Mg Tabs (Hydrochlorothiazide) .... Take one tablet by mouth daily.  Patient Instructions: 1)  Take 650 - 1000 mg of tylenol every 4-6 hours as needed for relief of pain or comfort of fever. Avoid taking  more than 4000 mg in a 24 hour period( can cause liver damage in higher doses).  2)  Watch for swelling, redness, drainage or bleeding.  Call with concerns.   3)  Apply pressure for 15 minutes if you notice any bleeding.

## 2010-10-20 NOTE — Progress Notes (Signed)
Summary: Ultrasound results  Phone Note Outgoing Call   Summary of Call: notify pt that the area is what we discussed Ultrasound shows that it  is a lipoma overlying the right superior trapezius musculature. no mass or cancer noted reassure pt Initial call taken by: Lehman Prom FNP,  August 25, 2010 7:57 PM  Follow-up for Phone Call        called [phone] no answer. Kayla Bean  August 30, 2010 3:27 PM  pt is aware.Marland KitchenMarland KitchenMarland KitchenArmenia Bean  August 31, 2010 9:42 AM

## 2010-10-20 NOTE — Letter (Signed)
Summary: TEST ORDER FORM//ULTRASOUND//APPT DATE & TIME  TEST ORDER FORM//ULTRASOUND//APPT DATE & TIME   Imported By: Arta Bruce 08/31/2010 14:10:04  _____________________________________________________________________  External Attachment:    Type:   Image     Comment:   External Document

## 2010-10-21 NOTE — Progress Notes (Signed)
Summary: Office Visit//DEPRESSION SCREENING  Office Visit//DEPRESSION SCREENING   Imported By: Arta Bruce 12/14/2009 15:34:06  _____________________________________________________________________  External Attachment:    Type:   Image     Comment:   External Document

## 2010-10-21 NOTE — Letter (Signed)
Summary: INFLUENZA VACCINATION  INFLUENZA VACCINATION   Imported By: Arta Bruce 08/07/2007 11:02:46  _____________________________________________________________________  External Attachment:    Type:   Image     Comment:   External Document

## 2010-10-21 NOTE — Letter (Signed)
Summary: [facility]  [facility]   Imported By: Arta Bruce 12/22/2008 11:18:17  _____________________________________________________________________  External Attachment:    Type:   Image     Comment:   External Document

## 2010-10-21 NOTE — Letter (Signed)
Summary: COLONOSCOPY REPORT  COLONOSCOPY REPORT   Imported By: Arta Bruce 11/04/2009 14:31:54  _____________________________________________________________________  External Attachment:    Type:   Image     Comment:   External Document

## 2010-10-25 ENCOUNTER — Encounter (INDEPENDENT_AMBULATORY_CARE_PROVIDER_SITE_OTHER): Payer: Self-pay | Admitting: Nurse Practitioner

## 2010-10-28 ENCOUNTER — Encounter (INDEPENDENT_AMBULATORY_CARE_PROVIDER_SITE_OTHER): Payer: Self-pay | Admitting: Nurse Practitioner

## 2010-11-03 NOTE — Letter (Signed)
Summary: [facility]   Imported By: Arta Bruce 10/28/2010 15:57:47  _____________________________________________________________________  External Attachment:    Type:   Image     Comment:   External Document

## 2010-11-03 NOTE — Miscellaneous (Signed)
Summary: Eye exam   Diabetes Management History:      The patient is a 55 years old female who comes in for evaluation of DM Type 2.  She has not been enrolled in the "Diabetic Education Program".  She is checking home blood sugars.  She says that she is not exercising regularly.    Diabetes Management Exam:    Eye Exam:       Eye Exam done elsewhere          Date: 10/25/2010          Results: normal          Done by: Wynona Luna  Diabetes Management Assessment/Plan:      Her blood pressure goal is < 130/80.   Clinical Lists Changes  Observations: Added new observation of EYES COMMENT: 10/2011 (10/28/2010 13:59) Added new observation of EYE EXAM BY: Wynona Luna (10/25/2010 14:01) Added new observation of DMEYEEXMRES: normal (10/25/2010 14:01) Added new observation of DIAB EYE EX: normal (10/25/2010 14:01)

## 2011-01-19 ENCOUNTER — Other Ambulatory Visit: Payer: Self-pay | Admitting: Physician Assistant

## 2011-01-24 ENCOUNTER — Other Ambulatory Visit: Payer: Self-pay | Admitting: Physician Assistant

## 2011-02-03 NOTE — Op Note (Signed)
[facility]  Patient:    Kayla Bean, Kayla Bean                   MRN: [id number] Proc. Date: 07/03/00 Adm. Date:  [id number] Attending:  Marcene Corning                           Operative Report  PREOPERATIVE DIAGNOSIS:  Right knee stiffness, status post knee replacement.  POSTOPERATIVE DIAGNOSIS:  Right knee stiffness, status post knee replacement.  PROCEDURE:  Right knee closed manipulation.  ANESTHESIA:  General.  SURGEON:  Lubertha Basque. Jerl Santos, M.D.  ASSISTANT:  Prince Rome, P.A.  INDICATION FOR PROCEDURE:  The patient is a 55 year old woman several months from a knee replacement on the right side.  Initially she had fairly good motion, but she gradually lost motion and at this point only has 65 degrees of flexion.  This is unacceptable in terms of getting up out of a chair, and she is offered closed manipulation.  The procedure was discussed with the patient, and informed operative consent was obtained after discussion of the possible complications of fracture.  DESCRIPTION OF PROCEDURE:  The patient was taken to the operative suite, where general anesthetic was applied without difficulty.  She was positioned supine. Her knee motion even asleep was only 0-60 degrees.  With a great deal of pressure, we were able to increase her flexion to 95 degrees.  In the operative note from her knee replacement, it was noted that once the extensor mechanism was closed, she could only flex it to 100 degrees at that point. Additionally, the left knee, which was not addressed surgically, only flexes to about 100 degrees.  This is in part related to the size of her thigh.  It was felt that 95 degrees of flexion was a good result from this manipulation. She will resume physical therapy tomorrow.  We did inject her knee with Marcaine at the end of the case after sterile prep with alcohol.  DISPOSITION:  The patient was extubated in the operating room  and returned to the recovery room in stable condition.  Plans were for her to go home the same day and follow up in the office in less than a week.  I will contact her by phone tonight, and she will start therapy tomorrow. DD:  07/03/00 TD:  07/03/00 Job: [id number]

## 2011-02-03 NOTE — Op Note (Signed)
Kayla Bean, [name]            ACCOUNT NO.:  [id number]   MEDICAL RECORD NO.:  [id number]          PATIENT TYPE:  INP   LOCATION:  2550                         FACILITY:  [facility]   PHYSICIAN:  Lubertha Basque. Dalldorf, M.D.DATE OF BIRTH:  1955/10/01   DATE OF PROCEDURE:  04/17/2006  DATE OF DISCHARGE:                                 OPERATIVE REPORT   PREOPERATIVE DIAGNOSIS:  Loose right total knee replacement.   POSTOPERATIVE DIAGNOSIS:  Loose right total knee replacement.   PROCEDURE:  Revision right total knee replacement.   ANESTHESIA:  General and block.   ATTENDING SURGEON:  Dr. Jerl Santos   ASSISTANT:  Carnaghi, PA   INDICATION FOR PROCEDURE:  The patient is a 55 year old woman about 6 years  from a primary knee replacement on the right.  She was always fairly stiff  and had a low level of pain but over the last few months has developed  significant increase in her amount of pain and an inability to bear weight  on this leg.  X-ray shows subsidence of the tibial component with collapse  in the medial aspect.  She had an obviously loose tibial component and is  offered a revision procedure.  Informed operative consent was obtained after  discussion about risks and complications of reaction to anesthesia,  infection, DVT, PE, death.   SUMMARY FINDINGS AND PROCEDURE:  Under general anesthesia and a block  through her old incision, we performed a revision total knee replacement on  the right.  The tibial component was grossly loose and came out quite  easily.  The patellar component appeared worn, and we did change the button,  placing a new standard plus DePuy patellar button.  The femoral component  was tested and could not be dislodged and did not move, so I assessed this  as stable, and this was not revised.  We had to lengthen the quadriceps  slightly.  I did this in a Z fashion.  We did remove the tibial component  easily followed by placement of an MBT revision tibia which  was a size 4  tray with a 13 x 60 stem and a 29 porous coated sleeve.  We also placed a  cement restrictor size 3 distally.  The cement was placed in the canal and  under the tray with an attempt to keep the porous coating free of cement.  I  did include vancomycin antibiotic in the cement.  We then placed a size 10  deep-dish polyethylene spacer.  We set her up intentionally quite loosely as  she had always been very stiff in both flexion and extension.  Bryna Colander  assisted throughout and was invaluable to the completion of the case in that  he helped retract and also closed simultaneously in order to minimize OR  time.  He was the only assistant that I had.   DESCRIPTION OF PROCEDURE:  The patient went to the operating suite where  general anesthetic was applied without difficulty.  She also given a block  in the preanesthesia area.  She was positioned supine and prepped, draped in  normal sterile  fashion.  After the administration of preop IV vancomycin,  the right leg was elevated, exsanguinated, and a tourniquet inflated about  her thigh.  Her old longitudinal incision was made with excision of the  scar.  All appropriate antiinfective measures were used including the  preoperative IV antibiotic, Betadine impregnated drape, and closed hooded  exhaust systems for each member of the surgical team.  The patient also  expressed an allergy to LATEX.  All appropriate provisions were made for  that as well.  I dissected down to the extensor mechanism through an  abundance of adipose tissue.  A medial parapatellar incision was made in  this structure.  The extensor mechanism was quite thick, and her knee was  full of scar tissue so first a thorough synovectomy was performed.  Eventually I was able to flex her knee, but we had to perform a Z  lengthening of the quadriceps tendon.  Basically I kept the medial half of  the tendon intact and laterally, performed the Z lengthening.   Nevertheless,  at this point, we were able to flex her knee at least to 90 degrees.  The  tibial component was grossly loose and was easily removed with the spacer.  I then attempted to loosen the femoral component, and this was stable.  We  did pop off the poly on the patellar component.  We then removed the  residual cement with an osteotome and rongeur.  I then reamed the tibial  canal and placed the MBT revision cutting guide.  This was utilized to make  a flat cut.  She had poor bone quality on the medial aspect, and we elected  to place a sleeve.  She was reamed and broached up to a size 29 sleeve with  the size 4 tray.  We also prepared her for the 60 mm stem, 13 mm in  diameter.  A trial reduction was done with this size, and this looked good,  so the trial components were removed.  I then irrigated the tibia with  pulsatile lavage and placed a size 3 cement restrictor distally.  Cement was  mixed including vancomycin and was pressurized into the canal and under the  tibial tray.  I then placed the MBT DePuy revision tibial component.  This  did not seat fully, and it was felt this was due to capture of the  metaphyseal region with the sleeve.  This was left several millimeters  proud, but this defect was filled with cement completely.  Size 10 insert  deep dish was then placed.  Her knee came to hyperextension and flexed well.  Excess cement was trimmed, and pressure was held on the tibial component  until the cement had hardened.  We then released the tourniquet, and a small  amount of bleeding was easily controlled with Bovie cautery.  The knee was  again thoroughly irrigated followed by placement of drain exiting  superolaterally.  The extensor mechanism was then repaired with a Z  lengthening on the lateral aspect.  The medial patellar incision and this Z  lengthening was repaired with Ethibond suture as well as some #1 Vicryl. Once this was accomplished, we could flex her to  90 degrees with a competent  quadriceps mechanism.  I did have to perform a lateral release as well.  The  wounds were again irrigated followed by reapproximation of subcutaneous  tissues with 0 and 2-0 undyed Vicryl and skin closure with staples.  Adaptic  was applied  to the wound followed by dry gauze and loose Ace wrap which was  latex free.  Estimated blood loss, perioperative fluids both accurate.  Tourniquet time can obtained from anesthesia records.   DISPOSITION:  The patient was extubated in the operating room and taken to  recovery in stable addition.  She was to be admitted to orthopedic surgery  service for appropriate postop care to include perioperative antibiotics,  Coumadin plus Lovenox for DVT prophylaxis.      Lubertha Basque Jerl Santos, M.D.  Electronically Signed     PGD/MEDQ  D:  04/17/2006  T:  04/17/2006  Job:  [id number]

## 2011-02-03 NOTE — Op Note (Signed)
[place]. [facility]  Patient:    Kayla Bean, Kayla Bean                   MRN: [id number] Proc. Date: 02/05/01 Adm. Date:  [id number] Attending:  Marcene Corning                           Operative Report  PREOPERATIVE DIAGNOSIS: 1. Left knee degenerative joint disease. 2. Left knee torn medial meniscus. 3. Right knee stiffness.  POSTOPERATIVE DIAGNOSIS: 1. Left knee degenerative joint disease. 2. Left knee synovitis. 3. Right knee stiffness.  OPERATION PERFORMED: 1. Left knee chondroplasty. 2. Left knee synovectomy. 3. Right knee closed manipulation.  ANESTHESIA:  Knee block and MAC.  ATTENDING SURGEON:  Lubertha Basque. Jerl Santos, M.D.  ASSISTANT:  Lindwood Qua, P.A.  INDICATIONS FOR PROCEDURE:  The patient is a 55 year old woman with a long history of bilateral knee difficulty.  She is status post a knee replacement on the right and has been left with some stiffness but unfortunately had developed contralateral knee pain related to her primary problem.  She has failed physical therapy and pills and other interventions and at this point is offered an arthroscopy on the left.  We plan a manipulation of the right where she has been left with a stiff knee replacement.  The procedures were discussed with the patient and informed operative consent was obtained after discussion of possible complications of reaction to anesthesia and infection.  DESCRIPTION OF PROCEDURE:  The patient was taken to an operating suite where knee block anesthetic was applied along with MAC.  She was then positioned supine and prepped and draped in normal sterile fashion.  After the administration of preop intravenous antibiotics and manipulation of the right knee was attempted.  The motion was about 0 to 80 and I really did not gain any motion with manipulation.  With some great force, I tried to increase her flexion but did not get anywhere and I was afraid that I could lead to  a fracture, so we did not do any further manipulation.  On the left side, her native motion was about 10 to 100 degrees.  We performed an arthroscopy through two inferior portals.  The suprapatellar pouch was benign as was the patellofemoral joint.  Medial compartment exhibited grade 3 and 4 change in the medial femoral condyle and a thorough chondroplasty was performed across most of the structure.  The medial meniscus itself was intact.  The ACL and PCL were intact and the lateral compartment was benign.  She did have significant synovitis through most of the joint with red beefy-appearing synovium.  A thorough synovectomy was performed.  The usual agents were injected at the end of the case plus Depo-Medrol.  Adaptic was placed over the wound followed by dry gauze and a loose Ace wrap.  Estimated blood loss and intraoperative fluids can be obtained from Anesthesia records.  DISPOSITION:  The patient was taken to the recovery room in stable condition. Plans were for her to go home the same day and to follow up in the office in less than a week.  I will contact her by phone tonight. DD:  02/05/01 TD:  02/05/01 Job: [id number]

## 2011-02-03 NOTE — Discharge Summary (Signed)
Kayla Bean, Kayla Bean            ACCOUNT NO.:  [id number]   MEDICAL RECORD NO.:  [id number]          PATIENT TYPE:  INP   LOCATION:  5005                         FACILITY:  [facility]   PHYSICIAN:  Lubertha Basque. Dalldorf, M.D.DATE OF BIRTH:  1956-01-09   DATE OF ADMISSION:  04/17/2006  DATE OF DISCHARGE:  04/23/2006                                 DISCHARGE SUMMARY   ADMISSION DIAGNOSES:  1.  Loose tibial component right total knee replacement.  2.  History of asthma.   DISCHARGE DIAGNOSES:  1.  Loose tibial component right total knee replacement.  2.  History of asthma.   OPERATION:  Right total knee revision tibial component and patella.   BRIEF HISTORY:  Kayla Bean is a 55 year old black female whose a patient  well-known to our practice and many years ago had undergone a right total  knee replacement and she had done well for some time but lately had  developed discomfort in her tibial region of her right total knee  replacement. Bone scan shows that her tibial component is loose. We had  aspirated and sent the aspirate from our office to the lab which showed no  sign of infection. We discussed treatment options with the patient and that  being total knee revision.   PERTINENT LABORATORY AND X-RAY DATA:  WBC is 11.0, RBC is 3.34, hemoglobin  10.6, hematocrit 31.1, platelets at 211. Sodium 138, potassium 3.8, glucose  114, BUN 13, creatinine 1.2. Wound and tissue cultures no growth from the  operating room.   RADIOLOGY:  Chest x-ray no acute disease.   HOSPITAL COURSE:  The patient was admitted postoperatively after her knee  revision. She was given a dose of IV Ancef a gram q.8 h x3 doses, advanced  her diet as tolerated, was on Lovenox protocol 3 mg subcu q.12 and we do  this for 21 days as she has an allergy to COUMADIN. Also put her on PCA  morphine pump, ferrous sulfate, p.o. pain medication, i.e. Percocet, used a  CPM machine 0-50 and advanced 10 degrees daily. Knee high  TEDs, incentive  spirometry and then out of bed with physical therapy. We had also had case  managers on this case as well and it was decided that her best option would  be to go to a skilled nursing facility. Her hospital course the first day  postop blood pressure was 118/58, temperature 99, hemoglobin 11.6, Foley was  taken out, her knee dressing was dry. It was changed numerous times  throughout her hospital stay and showed no sign of infection or irritation.  She had good sensation to her toes, negative calf pain, negative Homans. She  did have one episode during her hospital stay where she had some upper  airway secretion that was causing some difficulty breathing but once she  cleared this and some treatments with either an inhaler or nebulizer, her  breathing was uneventful and unlabored from that point on. She was continued  with physical therapy and was discharged.   CONDITION ON DISCHARGE:  Improved.   She will remain on no diet restriction.  She  can be weightbearing as  tolerated. We want physical therapy to work with motion of her knee and even  a CPM if necessary and advance as tolerated. May change her dressing every  other day. Any sign of infection, she is to call the office at [phone]. We  would like to see her in the office from 14 days after her surgical date.  She will continue in her knee high TEDs, continue with incentive spirometry,  continue on Lovenox 30 mg subcu 12 hours for a total of 21 days postop, also  Percocet 1 or 2 every 4-6 p.r.n. pain as needed and then ferrous sulfate 325  one a day as needed as well. Any sign of infection once again to call our  office at [phone].      Lindwood Qua, P.A.      Lubertha Basque Jerl Santos, M.D.  Electronically Signed    MC/MEDQ  D:  04/23/2006  T:  04/23/2006  Job:  [id number]

## 2011-02-03 NOTE — Op Note (Signed)
[place]. [facility]  Patient:    Kayla Bean                    MRN: [id number] Proc. Date: 04/17/00 Adm. Date:  [id number] Attending:  Marcene Corning                           Operative Report  PREOPERATIVE DIAGNOSIS:  Right knee degenerative arthritis.  POSTOPERATIVE DIAGNOSIS:  Right knee degenerative arthritis.  PROCEDURE:  Right total knee replacement.  ANESTHESIA:  General.  ASSISTANT:  Prince Rome, P.A.  INDICATIONS FOR PROCEDURE:  The patient is a 55 year old woman with a long history of right knee pain.  This has persisted in spite of an arthroscopy almost a year ago where bare bone was noted in the medial compartment, along with some patellofemoral change, as well.  Postoperatively, she received many cortisone injections and a course of aviscosupplementive agent.  She also underwent physical therapy and bracing.  She continued with knee pain.  She was eventually offered operative intervention once again, at this point, to consistent of a knee replacement.  The procedure was discussed with the patient and an informed operative consent was obtained after discussion of possible complications of reaction to the anesthesia, infection, DVT, PE, and death.  DESCRIPTION OF PROCEDURE:  The patient was taken to the operating suite where general anesthetic was applied without difficulty.  She was positioned supine and prepped and draped in a normal sterile fashion.  After the administration of IV antibiotics, the right leg was elevated, exsanguinated, and a tourniquet inflated about the side.  All appropriate anti-infective measures were used, including the aforementioned IV antibiotic, Betadine-impregnated drape, and closed hooded exhaust systems for each member of the surgical team.  A standard anterior approach was taken through the knee through a longitudinal midline incision.  Dissection was carried down to the extensor  mechanism, through which a medial parapatellar incision was made.  The patella was then flipped out of the way and the knee flexed past 90 deg.  She had end-stage generation in the medial compartment with some moderate changes in the patellofemoral compartment and some sparing of the lateral compartment.  ACL and PCL were intact.  The two central ligaments were removed, along with the residual meniscal tissues.  A tibial cut was then made with an extramedullary guide which was made parallel with the floor.  She had excellent bone quality. Anterior and posterior femoral cuts were made within intramedullary femoral guide to recreate a flexion gap of 10 mm.  Another intramedullary guide was placed in the femur to create a distal cut on the femur which made an equal extension gap of 10 mm.  The femur sized to a standard plus component and the appropriate guide was placed and used.  The tibia, also sized to the standard plus component and the appropriate guide was placed and used.  A trial reduction was performed with the aforementioned components and a 10 mm deep dish spacer.  She came to slight hyperextension and flexed to about 120 deg. The patella tracked well and no lateral release was required.  The patella was then addressed and taken from a thickness of 24-14 mm with an appropriate guide.  Three holes were made with the standard plus patellar guide.  All trial components were removed, following by a thorough pulsatile lavage of all cut bony surfaces.  The cement was mixed including antibiotic and  this was pressurized on the femur and tibia.  The DePuy LSC standard plus components were then placed on the tibia and femur.  Excess cement was trimmed.  The 10 mm deep dish spacer was applied.  The cement hardened in a rapid fashion and a second batch of cement was mixed for the patella.  This second batch did not include antibiotic.  This was pressurized into the patella and then the standard  plus component was placed with excess cement trimmed.  Pressure was held on this component also until the cement had hardened.  The knee again ranged fully and the patella tracked well.  The knee was thoroughly irrigated with saline and antibiotic solution.  A drain was placed exiting superolaterally, followed by release of the tourniquet at just over one hour of total time.  A small amount of bleeding was easily controlled with Bovie electrocautery and the knee was again irrigated.  The extensor mechanism was reapproximated with #1 Vicryl in an interrupted fashion.  After this had been completed, the knee passively flexed to about 100 deg.  2-0 undyed Vicryl and 0 Vicryl were used to reapproximate subcutaneous tissues, followed by staples in the skin.  Adaptic was placed over the wound, followed by dry gauze and a loose Ace wrap.  Estimated blood loss minimal.  Intraoperative fluids obtained for anesthesia records.  DISPOSITION:  The patient was extubated in the operating room and taken to the recovery room in stable condition.  Plans were for her to be admitted to the orthopedic surgery service for appropriate postop care to include perioperative antibiotics which will be Cleocin, immediate immobilization out of bed, a period of anticoagulation which will be with Lovenox as she experienced a reaction to last nights Coumadin. DD:  04/17/00 TD:  04/17/00 Job: [id number]

## 2011-02-03 NOTE — Discharge Summary (Signed)
[place]. [facility]  Patient:    Kayla Bean, Kayla Bean                   MRN: [id number] Adm. Date:  [id number] Disc. Date: [id number] Attending:  Marcene Corning Dictator:   Prince Rome, P.A.-C.                           Discharge Summary  ADMISSION DIAGNOSES: 1. Right knee end-stage degenerative joint disease. 2. Osteoarthritis. 3. Status post hysterectomy. 4. Status post thyroidectomy.  DISCHARGE DIAGNOSES: 1. Right knee end-stage degenerative joint disease. 2. Osteoarthritis. 3. Status post hysterectomy. 4. Status post thyroidectomy.  OPERATIONS:  Right total knee replacement.  BRIEF HISTORY:  A 55 year old black female patient, well known to our practice, who slipped on a wet floor at work in July 2000 and had severe right knee pain, and she ended up having an arthroscopy which showed severe DJD.  We had treated her conservatively at that point after the knee arthroscopy with corticosteroid injections and then later ______  Hyalgan injections and really had minimal to no relief.  She was having continued discomfort when she ambulated and nighttime pain.  She was unable to return to work because of pain, and basically her findings were end-stage DJD of the knee.  I discussed treatment options with the patient, those being total knee replacement which was elected to do.  PERTINENT LABORATORY AND X-RAY FINDINGS:  Last tested hemoglobin 11.5, hematocrit 32.2.  Urinalysis was done and essentially was normal.  EKG: Normal sinus rhythm.  COURSE IN THE HOSPITAL:  The patient was admitted from the operating room, placed on a variety of p.o. and I.M. analgesics for pain, PCA morphine pump, Lovenox protocol for DVT prophylaxis regulated by pharmacy.  Physical therapy was also ordered for weightbearing as tolerated, and her dressings were changed numerous times throughout her hospital stay.  Her wounds were benign. Rehabilitation consultation was  done, but it was felt the patient was improving and could be discharged home.  CONDITION ON DISCHARGE:  Improved.  DISCHARGE INSTRUCTIONS:  Arrangements for home health, physical therapy, OT, and RN.  prescription for pain medication given.  Weightbearing as tolerated. Follow up in our office at the two-week time period from surgery at which time staples will be removed. DD:  05/08/00 TD:  05/09/00 Job: [id number]

## 2011-05-27 ENCOUNTER — Emergency Department (HOSPITAL_COMMUNITY): Payer: Medicare Other

## 2011-05-27 ENCOUNTER — Emergency Department (HOSPITAL_COMMUNITY)
Admission: EM | Admit: 2011-05-27 | Discharge: 2011-05-27 | Disposition: A | Discharge status: Home / Self Care | Payer: Medicare Other | Attending: Emergency Medicine | Admitting: Emergency Medicine

## 2011-05-27 DIAGNOSIS — S8000XA Contusion of unspecified knee, initial encounter: Secondary | ICD-10-CM | POA: Insufficient documentation

## 2011-05-27 DIAGNOSIS — W010XXA Fall on same level from slipping, tripping and stumbling without subsequent striking against object, initial encounter: Secondary | ICD-10-CM | POA: Insufficient documentation

## 2011-05-27 DIAGNOSIS — I10 Essential (primary) hypertension: Secondary | ICD-10-CM | POA: Insufficient documentation

## 2011-05-27 DIAGNOSIS — E119 Type 2 diabetes mellitus without complications: Secondary | ICD-10-CM | POA: Insufficient documentation

## 2011-06-12 ENCOUNTER — Ambulatory Visit: Payer: Medicare Other | Admitting: Physical Therapy

## 2011-06-13 ENCOUNTER — Ambulatory Visit: Payer: Medicare Other | Attending: Orthopaedic Surgery

## 2011-06-13 DIAGNOSIS — Z96659 Presence of unspecified artificial knee joint: Secondary | ICD-10-CM | POA: Insufficient documentation

## 2011-06-13 DIAGNOSIS — IMO0001 Reserved for inherently not codable concepts without codable children: Secondary | ICD-10-CM | POA: Insufficient documentation

## 2011-06-13 DIAGNOSIS — M2550 Pain in unspecified joint: Secondary | ICD-10-CM | POA: Insufficient documentation

## 2011-06-16 ENCOUNTER — Ambulatory Visit: Payer: Medicare Other | Admitting: Rehabilitation

## 2011-06-20 ENCOUNTER — Ambulatory Visit: Payer: Medicare Other | Attending: Orthopaedic Surgery | Admitting: Rehabilitation

## 2011-06-20 DIAGNOSIS — IMO0001 Reserved for inherently not codable concepts without codable children: Secondary | ICD-10-CM | POA: Insufficient documentation

## 2011-06-20 DIAGNOSIS — M2550 Pain in unspecified joint: Secondary | ICD-10-CM | POA: Insufficient documentation

## 2011-06-20 DIAGNOSIS — Z96659 Presence of unspecified artificial knee joint: Secondary | ICD-10-CM | POA: Insufficient documentation

## 2011-06-23 ENCOUNTER — Ambulatory Visit: Payer: Medicare Other

## 2011-06-26 ENCOUNTER — Ambulatory Visit: Payer: Medicare Other | Admitting: Rehabilitation

## 2011-06-29 ENCOUNTER — Encounter: Payer: Medicare Other | Admitting: Rehabilitation

## 2011-08-18 ENCOUNTER — Other Ambulatory Visit: Payer: Self-pay | Admitting: Physician Assistant

## 2011-08-21 ENCOUNTER — Other Ambulatory Visit: Payer: Self-pay

## 2011-08-21 MED ORDER — HYDROCHLOROTHIAZIDE 12.5 MG PO CAPS: 12.5000 mg | ORAL_CAPSULE | Freq: Every day | ORAL | Status: DC

## 2011-08-21 NOTE — Telephone Encounter (Signed)
..   Requested Prescriptions   Signed Prescriptions Disp Refills  . hydrochlorothiazide (MICROZIDE) 12.5 MG capsule 30 capsule 2    Sig: Take 1 capsule (12.5 mg total) by mouth daily.    Authorizing Provider: Administrator, sports, [place] T    Ordering User: Lacie Scotts   E-scribe to [employer].

## 2011-08-22 ENCOUNTER — Other Ambulatory Visit: Payer: Self-pay | Admitting: Internal Medicine

## 2011-08-22 DIAGNOSIS — Z1231 Encounter for screening mammogram for malignant neoplasm of breast: Secondary | ICD-10-CM

## 2011-09-27 ENCOUNTER — Ambulatory Visit (HOSPITAL_COMMUNITY)
Admission: RE | Admit: 2011-09-27 | Discharge: 2011-09-27 | Disposition: A | Discharge status: Home / Self Care | Payer: Medicare Other | Source: Ambulatory Visit | Attending: Internal Medicine | Admitting: Internal Medicine

## 2011-09-27 DIAGNOSIS — Z1231 Encounter for screening mammogram for malignant neoplasm of breast: Secondary | ICD-10-CM | POA: Diagnosis not present

## 2011-09-29 DIAGNOSIS — M1710 Unilateral primary osteoarthritis, unspecified knee: Secondary | ICD-10-CM | POA: Diagnosis not present

## 2011-11-15 DIAGNOSIS — H4011X0 Primary open-angle glaucoma, stage unspecified: Secondary | ICD-10-CM | POA: Diagnosis not present

## 2011-11-27 DIAGNOSIS — M25569 Pain in unspecified knee: Secondary | ICD-10-CM | POA: Diagnosis not present

## 2011-12-04 DIAGNOSIS — M1710 Unilateral primary osteoarthritis, unspecified knee: Secondary | ICD-10-CM | POA: Diagnosis not present

## 2011-12-04 DIAGNOSIS — M722 Plantar fascial fibromatosis: Secondary | ICD-10-CM | POA: Diagnosis not present

## 2011-12-20 ENCOUNTER — Other Ambulatory Visit: Payer: Self-pay | Admitting: Physician Assistant

## 2012-01-05 DIAGNOSIS — IMO0001 Reserved for inherently not codable concepts without codable children: Secondary | ICD-10-CM | POA: Diagnosis not present

## 2012-02-05 DIAGNOSIS — K649 Unspecified hemorrhoids: Secondary | ICD-10-CM | POA: Diagnosis not present

## 2012-02-05 DIAGNOSIS — IMO0001 Reserved for inherently not codable concepts without codable children: Secondary | ICD-10-CM | POA: Diagnosis not present

## 2012-02-13 ENCOUNTER — Other Ambulatory Visit: Payer: Self-pay | Admitting: Physician Assistant

## 2012-02-14 DIAGNOSIS — H409 Unspecified glaucoma: Secondary | ICD-10-CM | POA: Diagnosis not present

## 2012-02-14 DIAGNOSIS — H4011X0 Primary open-angle glaucoma, stage unspecified: Secondary | ICD-10-CM | POA: Diagnosis not present

## 2012-02-26 ENCOUNTER — Ambulatory Visit (INDEPENDENT_AMBULATORY_CARE_PROVIDER_SITE_OTHER): Payer: Medicare Other | Admitting: Surgery

## 2012-02-26 ENCOUNTER — Encounter (INDEPENDENT_AMBULATORY_CARE_PROVIDER_SITE_OTHER): Payer: Self-pay | Admitting: Surgery

## 2012-02-26 VITALS — BP 142/98 | HR 64 | Temp 97.9°F | Resp 16 | Ht 62.0 in | Wt 271.2 lb

## 2012-02-26 DIAGNOSIS — K602 Anal fissure, unspecified: Secondary | ICD-10-CM | POA: Diagnosis not present

## 2012-02-26 DIAGNOSIS — K649 Unspecified hemorrhoids: Secondary | ICD-10-CM | POA: Diagnosis not present

## 2012-02-26 NOTE — Patient Instructions (Signed)
Anal Fissure, Adult An anal fissure is a small tear or crack in the skin around the anus. Bleeding from a fissure usually stops on its own within a few minutes. However, bleeding will often reoccur with each bowel movement until the crack heals.  CAUSES   Passing large, hard stools.   Frequent diarrheal stools.   Constipation.   Inflammatory bowel disease (Crohn's disease or ulcerative colitis).   Infections.   Anal sex.  SYMPTOMS   Small amounts of blood seen on your stools, on toilet paper, or in the toilet after a bowel movement.   Rectal bleeding.   Painful bowel movements.   Itching or irritation around the anus.  DIAGNOSIS Your caregiver will examine the anal area. An anal fissure can usually be seen with careful inspection. A rectal exam may be performed and a short tube (anoscope) may be used to examine the anal canal. TREATMENT   You may be instructed to take fiber supplements. These supplements can soften your stool to help make bowel movements easier.   Sitz baths may be recommended to help heal the tear. Do not use soap in the sitz baths.   A medicated cream or ointment may be prescribed to lessen discomfort.  HOME CARE INSTRUCTIONS   Maintain a diet high in fruits, whole grains, and vegetables. Avoid constipating foods like bananas and dairy products.   Take sitz baths as directed by your caregiver.   Drink enough fluids to keep your urine clear or pale yellow.   Only take over-the-counter or prescription medicines for pain, discomfort, or fever as directed by your caregiver. Do not take aspirin as this may increase bleeding.   Do not use ointments containing numbing medications (anesthetics) or hydrocortisone. They could slow healing.  SEEK MEDICAL CARE IF:   Your fissure is not completely healed within 3 days.   You have further bleeding.   You have a fever.   You have diarrhea mixed with blood.   You have pain.   Your problem is getting worse  rather than better.  MAKE SURE YOU:   Understand these instructions.   Will watch your condition.   Will get help right away if you are not doing well or get worse.  Document Released: 09/04/2005 Document Revised: 08/24/2011 Document Reviewed: 02/19/2011 [employer] Patient Information 2012 [employer]. 

## 2012-02-26 NOTE — Progress Notes (Signed)
Patient ID: Kayla Bean, female   DOB: 1956/06/15, 56 y.o.   MRN: [id number]  Chief Complaint  Patient presents with  . Hemorrhoids    NEW PT    HPI Kayla Bean is a 56 y.o. female.   HPI Patient sent at the request of Dr. Delrae Alfred rectal anal discomfort. She data for 3 months. She is severely painful bowel movements. She was taking some weight loss pills and developed diarrhea her pain started 3 months ago. She is very no bleeding. The pain severe in nature occurred during bowel movements. She has tried over-the-counter topical perforations for hemorrhoids and this has not helped. Her constipation is better. She takes a laxative to keep her bowels moving. Past Medical History  Diagnosis Date  . Arthritis   . Asthma   . Diabetes mellitus   . Heart murmur   . Hypertension   . Thyroid disease     hypothyroidism  . Hemorrhoids     Past Surgical History  Procedure Date  . Thyroidectomy   . Abdominal hysterectomy   . Total knee arthroplasty     right knee  . Knee surgery     left    History reviewed. No pertinent family history.  Social History History  Substance Use Topics  . Smoking status: Former Smoker    Quit date: 09/18/2001  . Smokeless tobacco: Not on file  . Alcohol Use: Yes    Allergies  Allergen Reactions  . Latex   . Penicillins   . Warfarin Sodium     REACTION: Got rash when took one pill of coumadin  related to knee replacement surgery in 2007.    Current Outpatient Prescriptions  Medication Sig Dispense Refill  . aspirin 81 MG tablet Take 81 mg by mouth daily.      . hydrochlorothiazide (MICROZIDE) 12.5 MG capsule Take 1 capsule (12.5 mg total) by mouth daily.  30 capsule  2  . levothyroxine (SYNTHROID, LEVOTHROID) 50 MCG tablet Take 50 mcg by mouth daily.      Marland Kitchen lisinopril (PRINIVIL,ZESTRIL) 20 MG tablet Take 20 mg by mouth daily.      Marland Kitchen lubiprostone (AMITIZA) 24 MCG capsule Take 24 mcg by mouth 2 (two) times daily with a meal.      .  metFORMIN (GLUCOPHAGE) 500 MG tablet Take 500 mg by mouth 2 (two) times daily with a meal.      . NON FORMULARY lipozene 1500mg       . travoprost, benzalkonium, (TRAVATAN) 0.004 % ophthalmic solution 1 drop at bedtime.        Review of Systems Review of Systems  Constitutional: Negative for fever, chills and unexpected weight change.  HENT: Negative for hearing loss, congestion, sore throat, trouble swallowing and voice change.   Eyes: Negative for visual disturbance.  Respiratory: Negative for cough and wheezing.   Cardiovascular: Negative for chest pain, palpitations and leg swelling.  Gastrointestinal: Positive for anal bleeding and rectal pain. Negative for nausea, vomiting, abdominal pain, diarrhea, constipation, blood in stool and abdominal distention.  Genitourinary: Negative for hematuria, vaginal bleeding and difficulty urinating.  Musculoskeletal: Negative for arthralgias.  Skin: Negative for rash and wound.  Neurological: Negative for seizures, syncope and headaches.  Hematological: Negative for adenopathy. Does not bruise/bleed easily.  Psychiatric/Behavioral: Negative for confusion.    Blood pressure 142/98, pulse 64, temperature 97.9 F (36.6 C), temperature source Temporal, resp. rate 16, height 5\' 2"  (1.575 m), weight 271 lb 3.2 oz (123.016 kg).  Physical Exam  Physical Exam  Constitutional: She is oriented to person, place, and time. She appears well-developed and well-nourished.  HENT:  Head: Normocephalic and atraumatic.  Eyes: EOM are normal. Pupils are equal, round, and reactive to light.  Neck: Normal range of motion. Neck supple.  Abdominal: Soft. She exhibits no distension.  Genitourinary:     Musculoskeletal: Normal range of motion.  Neurological: She is alert and oriented to person, place, and time.  Skin: Skin is warm and dry.  Psychiatric: She has a normal mood and affect. Her behavior is normal. Judgment and thought content normal.    Data  Reviewed Dr Delrae Alfred note  Assessment    Anal fissure.   Grade 2 internal hemorrhoid disease External hemorrhoid asymptomatic.    Plan    Recommend diltiazem topical gel 4 times a day. High fiber diet. Daily exercise. Continue her bowel regimen. If no resolution of pain and symptoms, she may require a lateral internal sphincterotomy. Her hemorrhoid disease is quite minimal today. The external hemorrhoid is not require treatment currently. Return in 3 weeks.       Maizie Garno A. 02/26/2012, 9:52 AM

## 2012-03-01 DIAGNOSIS — M1710 Unilateral primary osteoarthritis, unspecified knee: Secondary | ICD-10-CM | POA: Diagnosis not present

## 2012-03-19 ENCOUNTER — Ambulatory Visit (INDEPENDENT_AMBULATORY_CARE_PROVIDER_SITE_OTHER): Payer: Medicare Other | Admitting: Surgery

## 2012-03-19 ENCOUNTER — Encounter (INDEPENDENT_AMBULATORY_CARE_PROVIDER_SITE_OTHER): Payer: Self-pay | Admitting: Surgery

## 2012-03-19 VITALS — BP 140/84 | HR 83 | Temp 98.2°F | Resp 20 | Ht 62.0 in | Wt 265.4 lb

## 2012-03-19 DIAGNOSIS — K602 Anal fissure, unspecified: Secondary | ICD-10-CM

## 2012-03-19 NOTE — Patient Instructions (Signed)
Continue present care for 3 weeks. Return as needed.

## 2012-03-19 NOTE — Progress Notes (Signed)
Patient returns in followup for anal fissure. She is much better. Pain is much improved causing.  Exam: Posterior midline anal fissure with healing well.  Impression: Anal fissure improving  Plan: Continue medical management. High fiber diet. Refill diltiazem gel. Return if symptoms worsen.

## 2012-04-18 DIAGNOSIS — E119 Type 2 diabetes mellitus without complications: Secondary | ICD-10-CM | POA: Diagnosis not present

## 2012-06-05 DIAGNOSIS — M1710 Unilateral primary osteoarthritis, unspecified knee: Secondary | ICD-10-CM | POA: Diagnosis not present

## 2012-06-05 DIAGNOSIS — M25569 Pain in unspecified knee: Secondary | ICD-10-CM | POA: Diagnosis not present

## 2012-07-03 DIAGNOSIS — H409 Unspecified glaucoma: Secondary | ICD-10-CM | POA: Diagnosis not present

## 2012-07-03 DIAGNOSIS — H4011X0 Primary open-angle glaucoma, stage unspecified: Secondary | ICD-10-CM | POA: Diagnosis not present

## 2012-07-03 DIAGNOSIS — E119 Type 2 diabetes mellitus without complications: Secondary | ICD-10-CM | POA: Diagnosis not present

## 2012-07-24 DIAGNOSIS — Z23 Encounter for immunization: Secondary | ICD-10-CM | POA: Diagnosis not present

## 2012-09-07 DIAGNOSIS — M1710 Unilateral primary osteoarthritis, unspecified knee: Secondary | ICD-10-CM | POA: Diagnosis not present

## 2012-09-25 ENCOUNTER — Encounter (HOSPITAL_COMMUNITY): Payer: Self-pay

## 2012-09-25 ENCOUNTER — Other Ambulatory Visit: Payer: Self-pay | Admitting: Family Medicine

## 2012-09-25 ENCOUNTER — Emergency Department (INDEPENDENT_AMBULATORY_CARE_PROVIDER_SITE_OTHER): Payer: Medicare Other

## 2012-09-25 ENCOUNTER — Emergency Department (INDEPENDENT_AMBULATORY_CARE_PROVIDER_SITE_OTHER)
Admission: EM | Admit: 2012-09-25 | Discharge: 2012-09-25 | Disposition: A | Discharge status: Home / Self Care | Payer: Medicare Other | Source: Home / Self Care

## 2012-09-25 DIAGNOSIS — J45909 Unspecified asthma, uncomplicated: Secondary | ICD-10-CM

## 2012-09-25 DIAGNOSIS — M1710 Unilateral primary osteoarthritis, unspecified knee: Secondary | ICD-10-CM

## 2012-09-25 DIAGNOSIS — I10 Essential (primary) hypertension: Secondary | ICD-10-CM

## 2012-09-25 DIAGNOSIS — E039 Hypothyroidism, unspecified: Secondary | ICD-10-CM

## 2012-09-25 DIAGNOSIS — Z1231 Encounter for screening mammogram for malignant neoplasm of breast: Secondary | ICD-10-CM

## 2012-09-25 DIAGNOSIS — E119 Type 2 diabetes mellitus without complications: Secondary | ICD-10-CM

## 2012-09-25 DIAGNOSIS — IMO0002 Reserved for concepts with insufficient information to code with codable children: Secondary | ICD-10-CM

## 2012-09-25 LAB — COMPREHENSIVE METABOLIC PANEL
ALT: 23 U/L (ref 0–35)
AST: 30 U/L (ref 0–37)
Albumin: 4.1 g/dL (ref 3.5–5.2)
Alkaline Phosphatase: 97 U/L (ref 39–117)
BUN: 16 mg/dL (ref 6–23)
CO2: 26 mEq/L (ref 19–32)
Calcium: 10.1 mg/dL (ref 8.4–10.5)
Chloride: 96 mEq/L (ref 96–112)
Creatinine, Ser: 1.21 mg/dL — ABNORMAL HIGH (ref 0.50–1.10)
GFR calc Af Amer: 56 mL/min — ABNORMAL LOW (ref >90)
GFR calc non Af Amer: 48 mL/min — ABNORMAL LOW (ref >90)
Glucose, Bld: 128 mg/dL — ABNORMAL HIGH (ref 70–99)
Potassium: 4 mEq/L (ref 3.5–5.1)
Sodium: 136 mEq/L (ref 135–145)
Total Bilirubin: 0.4 mg/dL (ref 0.3–1.2)
Total Protein: 9.6 g/dL — ABNORMAL HIGH (ref 6.0–8.3)

## 2012-09-25 LAB — LIPID PANEL
Cholesterol: 166 mg/dL (ref 0–200)
HDL: 43 mg/dL (ref >39)
LDL Cholesterol: 96 mg/dL (ref 0–99)
Total CHOL/HDL Ratio: 3.9 RATIO
Triglycerides: 134 mg/dL (ref <150)
VLDL: 27 mg/dL (ref 0–40)

## 2012-09-25 LAB — TSH: TSH: 3.998 u[IU]/mL (ref 0.350–4.500)

## 2012-09-25 LAB — CBC
HCT: 43 % (ref 36.0–46.0)
Hemoglobin: 14.9 g/dL (ref 12.0–15.0)
MCH: 30.5 pg (ref 26.0–34.0)
MCHC: 34.7 g/dL (ref 30.0–36.0)
MCV: 88.1 fL (ref 78.0–100.0)
Platelets: 247 10*3/uL (ref 150–400)
RBC: 4.88 MIL/uL (ref 3.87–5.11)
RDW: 13.1 % (ref 11.5–15.5)
WBC: 7.8 10*3/uL (ref 4.0–10.5)

## 2012-09-25 LAB — HEMOGLOBIN A1C
Hgb A1c MFr Bld: 7.2 % — ABNORMAL HIGH (ref <5.7)
Mean Plasma Glucose: 160 mg/dL — ABNORMAL HIGH (ref <117)

## 2012-09-25 MED ORDER — ALBUTEROL SULFATE (5 MG/ML) 0.5% IN NEBU
2.5000 mg | INHALATION_SOLUTION | Freq: Once | RESPIRATORY_TRACT | Status: AC
  Administered 2012-09-25: 2.5 mg via RESPIRATORY_TRACT

## 2012-09-25 MED ORDER — METFORMIN HCL 500 MG PO TABS: 500.0000 mg | ORAL_TABLET | Freq: Two times a day (BID) | ORAL | Status: DC

## 2012-09-25 MED ORDER — LISINOPRIL 20 MG PO TABS: 20.0000 mg | ORAL_TABLET | Freq: Every day | ORAL | Status: DC

## 2012-09-25 MED ORDER — ALBUTEROL SULFATE (5 MG/ML) 0.5% IN NEBU
INHALATION_SOLUTION | RESPIRATORY_TRACT | Status: AC
  Filled 2012-09-25: qty 0.5

## 2012-09-25 MED ORDER — LEVOTHYROXINE SODIUM 50 MCG PO TABS: 50.0000 ug | ORAL_TABLET | Freq: Every day | ORAL | Status: DC

## 2012-09-25 MED ORDER — HYDROCHLOROTHIAZIDE 12.5 MG PO CAPS: 12.5000 mg | ORAL_CAPSULE | Freq: Every day | ORAL | Status: DC

## 2012-09-25 MED ORDER — IPRATROPIUM BROMIDE 0.02 % IN SOLN
0.5000 mg | Freq: Once | RESPIRATORY_TRACT | Status: AC
  Administered 2012-09-25: 0.5 mg via RESPIRATORY_TRACT

## 2012-09-25 MED ORDER — LUBIPROSTONE 24 MCG PO CAPS: 24.0000 ug | ORAL_CAPSULE | Freq: Two times a day (BID) | ORAL | Status: DC

## 2012-09-25 MED ORDER — AZITHROMYCIN 250 MG PO TABS: ORAL_TABLET | ORAL | Status: DC

## 2012-09-25 NOTE — ED Notes (Signed)
Complain of flu like symptoms past couple of day.SOB, chest feels tight cough congestion  Headache both eyes hurt vomited one time

## 2012-09-25 NOTE — ED Provider Notes (Signed)
History     CSN: [id number]  Arrival date & time 09/25/12  1141   Chief Complaint  Patient presents with  . Influenza  . Medication Refill   HPI Pt says she needs refills on her meds.  Pt says that she has been having cough and cold symptoms 3 days.  She is coughing up yellow phlegm.  Pt has had some fever at home and chills.  Pt reports that she is having sinus congestion.  She is having shortness of breath.  Chest congestion is present.  Pt is having some pain with coughing in the chest.  Pt says that she has vomited once.  Pt says that she is coughing everytime she takes a deep breath.  Pt says that she does not have a history of pneumonia.    Past Medical History  Diagnosis Date  . Arthritis   . Asthma   . Diabetes mellitus   . Heart murmur   . Hypertension   . Thyroid disease     hypothyroidism  . Hemorrhoids     Past Surgical History  Procedure Date  . Thyroidectomy   . Abdominal hysterectomy   . Total knee arthroplasty     right knee  . Knee surgery     left   No family history on file.  History  Substance Use Topics  . Smoking status: Former Smoker    Quit date: 09/18/2001  . Smokeless tobacco: Not on file  . Alcohol Use: Yes    OB History    Grav Para Term Preterm Abortions TAB SAB Ect Mult Living                 Review of Systems  Constitutional: Positive for fever, chills and fatigue.  HENT: Positive for rhinorrhea, sneezing and postnasal drip.   Eyes: Positive for redness.  Respiratory: Positive for cough, chest tightness, shortness of breath, wheezing and stridor.   Musculoskeletal: Positive for back pain and arthralgias.  Neurological: Positive for dizziness and light-headedness.  All other systems reviewed and are negative.    Allergies  Latex; Penicillins; and Warfarin sodium  Home Medications   Current Outpatient Rx  Name  Route  Sig  Dispense  Refill  . ASPIRIN 81 MG PO TABS   Oral   Take 81 mg by mouth daily.         Marland Kitchen  HYDROCHLOROTHIAZIDE 12.5 MG PO CAPS   Oral   Take 1 capsule (12.5 mg total) by mouth daily.   30 capsule   2     Please contact office for a appointment for future ...   . LEVOTHYROXINE SODIUM 50 MCG PO TABS   Oral   Take 50 mcg by mouth daily.         Marland Kitchen LISINOPRIL 20 MG PO TABS   Oral   Take 20 mg by mouth daily.         . LUBIPROSTONE 24 MCG PO CAPS   Oral   Take 24 mcg by mouth 2 (two) times daily with a meal.         . METFORMIN HCL 500 MG PO TABS   Oral   Take 500 mg by mouth 2 (two) times daily with a meal.         . NON FORMULARY      lipozene 1500mg          . TRAVOPROST 0.004 % OP SOLN      1 drop at bedtime.  BP 127/80  Pulse 100  Temp 99.2 F (37.3 C) (Oral)  Resp 19  SpO2 100%  Physical Exam  Nursing note and vitals reviewed. Constitutional: She is oriented to person, place, and time. She appears well-developed and well-nourished. No distress.  HENT:  Head: Normocephalic and atraumatic.  Eyes: EOM are normal. Pupils are equal, round, and reactive to light.  Neck: Normal range of motion. Neck supple. No thyromegaly present.  Cardiovascular: Normal rate, regular rhythm and normal heart sounds.   Pulmonary/Chest: Effort normal. No respiratory distress. She has wheezes. She has rales.  Abdominal: Soft. Bowel sounds are normal.  Musculoskeletal: Normal range of motion. She exhibits edema. She exhibits no tenderness.  Neurological: She is alert and oriented to person, place, and time.  Skin: Skin is warm and dry.  Psychiatric: She has a normal mood and affect. Her behavior is normal. Judgment and thought content normal.    ED Course  Procedures (including critical care time)  Labs Reviewed - No data to display No results found.   No diagnosis found. CXR negative   MDM  IMPRESSION  DM type 2  HTN  Glaucoma  URI with acute bronchitis  Cough  RECOMMENDATIONS / PLAN Nebulizer treatment given in the office today and  I listened to the patient's lungs and noticed an improvement in her respiratory status.  No further wheezing was noted.  PA and lateral chest x-ray reviewed in the office today and results consistent with no acute process. Zithromax Z pack prescribed for patient to take as directed with one refill.  The patient is going to continue her asthma medications she did not need refills on her inhalers.  I strongly advised her to see her eye care specialist on regular basis which she reports she sees every 4-6 months to evaluate eye pressures.  She is taking eyedrops for glaucoma.  The patient was given instructions to return if no improvement.  We'll followup on the results of the lab tests ordered today.  The patient declined flu vaccine. Labs done today   Time spent 52 mins: Most time spent in counseling and coordination of care  FOLLOW UP 3 months   The patient was given clear instructions to go to ER or return to medical center if symptoms don't improve, worsen or new problems develop.  The patient verbalized understanding.  The patient was told to call to get lab results if they haven't heard anything in the next week.           Cleora Fleet, MD 09/25/12 1505

## 2012-09-27 ENCOUNTER — Telehealth (HOSPITAL_COMMUNITY): Payer: Self-pay

## 2012-09-27 NOTE — Telephone Encounter (Signed)
Message copied by Lestine Mount on Fri Sep 27, 2012  6:31 PM ------      Message from: Cleora Fleet      Created: Thu Sep 26, 2012 10:59 PM       Please notify patient that her CBC was normal but her kidney function was diminished.  Her blood sugar was elevated and A1c was 7.2%.  Her cholesterol is good.  Her TSH thyroid test was normal.  Recommend rechecking labs in 3 months.                   Rodney Langton, MD, CDE, FAAFP      [facility]      [facility]      [place], [place]

## 2012-10-01 ENCOUNTER — Ambulatory Visit (HOSPITAL_COMMUNITY)
Admission: RE | Admit: 2012-10-01 | Discharge: 2012-10-01 | Disposition: A | Discharge status: Home / Self Care | Payer: Medicare Other | Source: Ambulatory Visit | Attending: Family Medicine | Admitting: Family Medicine

## 2012-10-01 DIAGNOSIS — Z1231 Encounter for screening mammogram for malignant neoplasm of breast: Secondary | ICD-10-CM

## 2012-10-09 ENCOUNTER — Emergency Department (INDEPENDENT_AMBULATORY_CARE_PROVIDER_SITE_OTHER)
Admission: EM | Admit: 2012-10-09 | Discharge: 2012-10-09 | Disposition: A | Discharge status: Home / Self Care | Payer: Medicare Other | Source: Home / Self Care | Attending: Family Medicine | Admitting: Family Medicine

## 2012-10-09 ENCOUNTER — Encounter (HOSPITAL_COMMUNITY): Payer: Self-pay

## 2012-10-09 ENCOUNTER — Emergency Department (INDEPENDENT_AMBULATORY_CARE_PROVIDER_SITE_OTHER): Payer: Medicare Other

## 2012-10-09 DIAGNOSIS — N289 Disorder of kidney and ureter, unspecified: Secondary | ICD-10-CM

## 2012-10-09 DIAGNOSIS — IMO0001 Reserved for inherently not codable concepts without codable children: Secondary | ICD-10-CM

## 2012-10-09 DIAGNOSIS — E1165 Type 2 diabetes mellitus with hyperglycemia: Secondary | ICD-10-CM

## 2012-10-09 DIAGNOSIS — E039 Hypothyroidism, unspecified: Secondary | ICD-10-CM

## 2012-10-09 DIAGNOSIS — I10 Essential (primary) hypertension: Secondary | ICD-10-CM

## 2012-10-09 DIAGNOSIS — K5900 Constipation, unspecified: Secondary | ICD-10-CM

## 2012-10-09 DIAGNOSIS — K8020 Calculus of gallbladder without cholecystitis without obstruction: Secondary | ICD-10-CM

## 2012-10-09 DIAGNOSIS — K5909 Other constipation: Secondary | ICD-10-CM

## 2012-10-09 DIAGNOSIS — IMO0002 Reserved for concepts with insufficient information to code with codable children: Secondary | ICD-10-CM

## 2012-10-09 LAB — POCT URINALYSIS DIP (DEVICE)
Bilirubin Urine: NEGATIVE
Glucose, UA: NEGATIVE mg/dL
Ketones, ur: NEGATIVE mg/dL
Leukocytes, UA: NEGATIVE
Nitrite: NEGATIVE
Protein, ur: NEGATIVE mg/dL
Specific Gravity, Urine: 1.015 (ref 1.005–1.030)
Urobilinogen, UA: 0.2 mg/dL (ref 0.0–1.0)
pH: 6 (ref 5.0–8.0)

## 2012-10-09 MED ORDER — LEVOTHYROXINE SODIUM 75 MCG PO TABS: 75.0000 ug | ORAL_TABLET | Freq: Every day | ORAL | Status: DC

## 2012-10-09 MED ORDER — METFORMIN HCL 1000 MG PO TABS: 1000.0000 mg | ORAL_TABLET | Freq: Two times a day (BID) | ORAL | Status: DC

## 2012-10-09 NOTE — ED Notes (Signed)
Patient also states has been having lower right quadrant discomfort and also pain to the right lower back for almost 2 weeks

## 2012-10-09 NOTE — ED Notes (Signed)
Follow up-has questions on her bloodwork

## 2012-10-09 NOTE — ED Provider Notes (Signed)
History    CSN: [id number]  Arrival date & time 10/09/12  1242  Chief Complaint  Patient presents with  . Follow-up   HPI Patient presenting today to discuss her lab results.  She was concerned about the report about her renal insufficiency.  We discussed this today.  She doesn't have a history of renal insufficiency but with her last lab draw she did have some noted.  In addition, it was noted that she was having more frequent hair loss.  She has hypothyroidism status post thyroidectomy.  She's been taking her thyroid medications regularly but her TSH has been trending upwards.  The patient reports she's had some right lower part of abdominal pain it has not been severe but she says that when she drinks water it seems to improve.  She doesn't have a history of kidney stones she.  She does have her gallbladder.  Patient denies nausea and vomiting.  She denies constipation.  In addition, her prescription for metformin at 500 mg tablets and she brought her bottle from health services where she had been taking 1000 milligrams twice per day.  Past Medical History  Diagnosis Date  . Arthritis   . Asthma   . Diabetes mellitus   . Heart murmur   . Hypertension   . Thyroid disease     hypothyroidism  . Hemorrhoids     Past Surgical History  Procedure Date  . Thyroidectomy   . Abdominal hysterectomy   . Total knee arthroplasty     right knee  . Knee surgery     left    No family history on file.  History  Substance Use Topics  . Smoking status: Former Smoker    Quit date: 09/18/2001  . Smokeless tobacco: Not on file  . Alcohol Use: Yes    OB History    Grav Para Term Preterm Abortions TAB SAB Ect Mult Living                 Review of Systems  Gastrointestinal: Positive for abdominal pain.       Right lower quadrant  Genitourinary: Positive for urgency and flank pain.  Musculoskeletal: Positive for back pain and arthralgias.  Skin:       Hair loss  All other systems  reviewed and are negative.    Allergies  Latex; Penicillins; and Warfarin sodium  Home Medications   Current Outpatient Rx  Name  Route  Sig  Dispense  Refill  . ASPIRIN 81 MG PO TABS   Oral   Take 81 mg by mouth daily.         Marland Kitchen HYDROCHLOROTHIAZIDE 12.5 MG PO CAPS   Oral   Take 1 capsule (12.5 mg total) by mouth daily.   30 capsule   3     Please contact office for a appointment for future ...   . LEVOTHYROXINE SODIUM 50 MCG PO TABS   Oral   Take 1 tablet (50 mcg total) by mouth daily.   30 tablet   3   . LISINOPRIL 20 MG PO TABS   Oral   Take 1 tablet (20 mg total) by mouth daily.   30 tablet   3   . LUBIPROSTONE 24 MCG PO CAPS   Oral   Take 1 capsule (24 mcg total) by mouth 2 (two) times daily with a meal.   60 capsule   3   . TRAVOPROST 0.004 % OP SOLN      1 drop  at bedtime.         . AZITHROMYCIN 250 MG PO TABS      Take as directed   6 each   1   . METFORMIN HCL 1000 MG PO TABS   Oral   Take 1 tablet (1,000 mg total) by mouth 2 (two) times daily with a meal.   60 tablet   3   . NON FORMULARY      lipozene 1500mg            BP 132/81  Pulse 92  Temp 98.2 F (36.8 C)  SpO2 99%  Physical Exam  Nursing note and vitals reviewed. Constitutional: She is oriented to person, place, and time. She appears well-developed and well-nourished. No distress.  HENT:  Head: Normocephalic and atraumatic.  Eyes: EOM are normal. Pupils are equal, round, and reactive to light.  Neck: Normal range of motion. Neck supple. No thyromegaly present.  Cardiovascular: Normal rate, regular rhythm and normal heart sounds.   Pulmonary/Chest: Effort normal and breath sounds normal. No respiratory distress. She has no wheezes. She has no rales. She exhibits no tenderness.  Abdominal: Soft. Bowel sounds are normal. She exhibits no mass. There is tenderness. There is no rebound and no guarding.       Very mild tenderness in the right lower quadrant, no guarding or  rebound tenderness noted, no CVA tenderness  Musculoskeletal: Normal range of motion. She exhibits no edema.  Neurological: She is alert and oriented to person, place, and time.  Skin: Skin is warm and dry. No rash noted. No erythema. No pallor.  Psychiatric: She has a normal mood and affect. Her behavior is normal. Judgment and thought content normal.    ED Course  Procedures (including critical care time)  Labs Reviewed - No data to display No results found.  No diagnosis found.  MDM  IMPRESSION  Renal insufficiency  Hypertension  Type 2 diabetes mellitus, controlled  Hypothyroidism status post thyroidectomy  Right lower cord true abdominal tenderness  Cholelithiasis  constipation  RECOMMENDATIONS / PLAN Check urinalysis and KUB in the office today : Results Reviewed - moderate stool and gallstones seen Discussed with patient about renal insufficiency recommend retesting renal function in 3 months Slight increase of levothyroxine to 75 mcg daily Pt is taking Amitiza for her constipation.  She feels like she needs to have BM now.  Encouraged to have regular BM.    FOLLOW UP 3 months as scheduled  The patient was given clear instructions to go to ER or return to medical center if symptoms don't improve, worsen or new problems develop.  The patient verbalized understanding.  The patient was told to call to get lab results if they haven't heard anything in the next week.            Cleora Fleet, MD 10/09/12 1459

## 2012-10-25 MED ORDER — HYDROCHLOROTHIAZIDE 12.5 MG PO CAPS: 12.5000 mg | ORAL_CAPSULE | Freq: Every day | ORAL | Status: DC

## 2012-11-07 MED ORDER — LUBIPROSTONE 24 MCG PO CAPS: 24.0000 ug | ORAL_CAPSULE | Freq: Two times a day (BID) | ORAL | Status: DC

## 2012-11-26 DIAGNOSIS — H4011X0 Primary open-angle glaucoma, stage unspecified: Secondary | ICD-10-CM | POA: Diagnosis not present

## 2012-12-09 DIAGNOSIS — M25579 Pain in unspecified ankle and joints of unspecified foot: Secondary | ICD-10-CM | POA: Diagnosis not present

## 2012-12-09 DIAGNOSIS — M1710 Unilateral primary osteoarthritis, unspecified knee: Secondary | ICD-10-CM | POA: Diagnosis not present

## 2012-12-24 ENCOUNTER — Encounter (HOSPITAL_COMMUNITY): Payer: Self-pay

## 2012-12-24 ENCOUNTER — Other Ambulatory Visit (HOSPITAL_COMMUNITY)
Admission: RE | Admit: 2012-12-24 | Discharge: 2012-12-24 | Disposition: A | Discharge status: Home / Self Care | Payer: Medicare Other | Source: Ambulatory Visit | Attending: Family Medicine | Admitting: Family Medicine

## 2012-12-24 ENCOUNTER — Emergency Department (INDEPENDENT_AMBULATORY_CARE_PROVIDER_SITE_OTHER)
Admission: EM | Admit: 2012-12-24 | Discharge: 2012-12-24 | Disposition: A | Discharge status: Home Health | Payer: Medicare Other | Source: Home / Self Care | Attending: Family Medicine | Admitting: Family Medicine

## 2012-12-24 DIAGNOSIS — E119 Type 2 diabetes mellitus without complications: Secondary | ICD-10-CM

## 2012-12-24 DIAGNOSIS — Z113 Encounter for screening for infections with a predominantly sexual mode of transmission: Secondary | ICD-10-CM | POA: Diagnosis not present

## 2012-12-24 DIAGNOSIS — E039 Hypothyroidism, unspecified: Secondary | ICD-10-CM

## 2012-12-24 DIAGNOSIS — J45909 Unspecified asthma, uncomplicated: Secondary | ICD-10-CM

## 2012-12-24 DIAGNOSIS — N760 Acute vaginitis: Secondary | ICD-10-CM | POA: Insufficient documentation

## 2012-12-24 DIAGNOSIS — K8020 Calculus of gallbladder without cholecystitis without obstruction: Secondary | ICD-10-CM

## 2012-12-24 DIAGNOSIS — I10 Essential (primary) hypertension: Secondary | ICD-10-CM

## 2012-12-24 LAB — POCT URINALYSIS DIP (DEVICE)
Bilirubin Urine: NEGATIVE
Glucose, UA: NEGATIVE mg/dL
Ketones, ur: NEGATIVE mg/dL
Leukocytes, UA: NEGATIVE
Nitrite: NEGATIVE
Protein, ur: NEGATIVE mg/dL
Specific Gravity, Urine: 1.015 (ref 1.005–1.030)
Urobilinogen, UA: 0.2 mg/dL (ref 0.0–1.0)
pH: 7.5 (ref 5.0–8.0)

## 2012-12-24 MED ORDER — FLUCONAZOLE 150 MG PO TABS: 150.0000 mg | ORAL_TABLET | Freq: Once | ORAL | Status: DC

## 2012-12-24 MED ORDER — FLUTICASONE PROPIONATE 50 MCG/ACT NA SUSP: 2.0000 | Freq: Every day | NASAL | Status: DC

## 2012-12-24 NOTE — ED Provider Notes (Signed)
History   CSN: [id number]  Arrival date & time 12/24/12  1524   First MD Initiated Contact with Patient 12/24/12 1545      Chief Complaint  Patient presents with  . Hip Pain  . Vaginal Itching   HPI Pt reporting that she is following up on her gallstone.  She says that it has been present since March 26.  Pt reports that she is not having a vaginal discharge.  Pt says that she is concerned because she has been sexually active with a partner and wants to get checked out.  She says that her diabetes has been well controlled.  She says that she is scratching her vagina frequently because of intense pruritus.  No fever or chills reported.    Past Medical History  Diagnosis Date  . Arthritis   . Asthma   . Diabetes mellitus   . Heart murmur   . Hypertension   . Thyroid disease     hypothyroidism  . Hemorrhoids     Past Surgical History  Procedure Laterality Date  . Thyroidectomy    . Abdominal hysterectomy    . Total knee arthroplasty      right knee  . Knee surgery      left    No family history on file.  History  Substance Use Topics  . Smoking status: Former Smoker    Quit date: 09/18/2001  . Smokeless tobacco: Not on file  . Alcohol Use: Yes    OB History   Grav Para Term Preterm Abortions TAB SAB Ect Mult Living                 Review of Systems Constitutional: Negative.  HENT: Negative.  Respiratory: Negative.  Cardiovascular: Negative.  Gastrointestinal: Negative.  Endocrine: Negative.  Genitourinary: Negative.  Musculoskeletal: Negative.  Skin: Negative.  Allergic/Immunologic: Negative.  Neurological: Negative.  Hematological: Negative.  Psychiatric/Behavioral: Negative.  All other systems reviewed and are negative   Allergies  Latex; Penicillins; and Warfarin sodium  Home Medications   Current Outpatient Rx  Name  Route  Sig  Dispense  Refill  . aspirin 81 MG tablet   Oral   Take 81 mg by mouth daily.         Marland Kitchen azithromycin  (ZITHROMAX Z-PAK) 250 MG tablet      Take as directed   6 each   1   . hydrochlorothiazide (MICROZIDE) 12.5 MG capsule   Oral   Take 1 capsule (12.5 mg total) by mouth daily.   30 capsule   3   . levothyroxine (SYNTHROID, LEVOTHROID) 75 MCG tablet   Oral   Take 1 tablet (75 mcg total) by mouth daily.   30 tablet   3   . lisinopril (PRINIVIL,ZESTRIL) 20 MG tablet   Oral   Take 1 tablet (20 mg total) by mouth daily.   30 tablet   3   . lubiprostone (AMITIZA) 24 MCG capsule   Oral   Take 1 capsule (24 mcg total) by mouth 2 (two) times daily with a meal.   60 capsule   3   . metFORMIN (GLUCOPHAGE) 1000 MG tablet   Oral   Take 1 tablet (1,000 mg total) by mouth 2 (two) times daily with a meal.   60 tablet   3   . NON FORMULARY      lipozene 1500mg          . travoprost, benzalkonium, (TRAVATAN) 0.004 % ophthalmic solution  1 drop at bedtime.           BP 127/81  Pulse 72  Temp(Src) 98 F (36.7 C) (Oral)  Resp 16  SpO2 100%  Physical Exam Nursing note and vitals reviewed.  Constitutional: She is oriented to person, place, and time. She appears well-developed and well-nourished. No distress.  HENT:  Head: Normocephalic and atraumatic.  Eyes: Conjunctivae and EOM are normal. Pupils are equal, round, and reactive to light.  Neck: Normal range of motion. Neck supple. No JVD present. No tracheal deviation present. No thyromegaly present.  Cardiovascular: Normal rate, regular rhythm and normal heart sounds.  Pulmonary/Chest: Effort normal and breath sounds normal. No respiratory distress. She has no wheezes.  Abdominal: Soft. Bowel sounds are normal.  Musculoskeletal: Normal range of motion. She exhibits no edema and no tenderness.  Lymphadenopathy:  She has no cervical adenopathy.  Neurological: She is alert and oriented to person, place, and time. She has normal reflexes.  Skin: Skin is warm and dry.  Psychiatric: She has a normal mood and affect. Her  behavior is normal. Judgment and thought content normal.    ED Course  Procedures (including critical care time)  Labs Reviewed - No data to display No results found.   No diagnosis found.  MDM  IMPRESSION  Hypertension  Type 2 diabetes mellitus  Glaucoma  Hypothyroidism  Cholelithiasis   RECOMMENDATIONS / PLAN Check urine cytology and urinalysis studies Continue metformin 1000 mg po bid  Continue synthroid daily Continue hydrochlorothiazide 12.5 mg po daily and closely follow    FOLLOW UP 4 months   The patient was given clear instructions to go to ER or return to medical center if symptoms don't improve, worsen or new problems develop.  The patient verbalized understanding.  The patient was told to call to get lab results if they haven't heard anything in the next week.            Cleora Fleet, MD 12/24/12 [phone]

## 2012-12-24 NOTE — ED Notes (Signed)
Patient complains of pain to lower right back and right side Also has been having vaginal itching since 3/26

## 2012-12-27 NOTE — Progress Notes (Signed)
Quick Note:  Please inform patient that her urine tests came back positive for growth of bacteria in the vagina. This is called bacterial vaginosis. This not an STD. The patient is to take antibiotics to clear up the overgrowth of bacteria. Please call and a prescription for metronidazole 500 mg tablets take 1 by mouth twice a day x7 days, #14, no refills.  Rodney Langton, MD, CDE, FAAFP [facility] [place], [place]   ______

## 2012-12-29 DIAGNOSIS — L292 Pruritus vulvae: Secondary | ICD-10-CM | POA: Insufficient documentation

## 2012-12-29 NOTE — Progress Notes (Signed)
Quick Note:  Please Refer patient for an appointment with a gynecologist at the [facility] for chronic vulvar pruritus. Please get her an appointment as soon as possible.   Maryln Manuel, MD ______

## 2012-12-30 ENCOUNTER — Telehealth (HOSPITAL_COMMUNITY): Payer: Self-pay

## 2012-12-30 NOTE — ED Notes (Signed)
Lab results given Prescription called in to [facility] on [address] [phone]

## 2013-01-14 NOTE — ED Notes (Signed)
[facility] Waiting on an appt

## 2013-02-06 ENCOUNTER — Encounter: Payer: Self-pay | Admitting: Obstetrics & Gynecology

## 2013-02-06 ENCOUNTER — Encounter: Payer: Medicare Other | Admitting: Obstetrics & Gynecology

## 2013-02-14 NOTE — Progress Notes (Signed)
Erroneous encounter

## 2013-02-17 ENCOUNTER — Emergency Department (HOSPITAL_COMMUNITY): Payer: Medicare Other

## 2013-02-17 ENCOUNTER — Emergency Department (HOSPITAL_COMMUNITY)
Admission: EM | Admit: 2013-02-17 | Discharge: 2013-02-17 | Disposition: A | Discharge status: Home / Self Care | Payer: Medicare Other | Attending: Emergency Medicine | Admitting: Emergency Medicine

## 2013-02-17 ENCOUNTER — Encounter (HOSPITAL_COMMUNITY): Payer: Self-pay | Admitting: *Deleted

## 2013-02-17 DIAGNOSIS — E119 Type 2 diabetes mellitus without complications: Secondary | ICD-10-CM | POA: Insufficient documentation

## 2013-02-17 DIAGNOSIS — Z8679 Personal history of other diseases of the circulatory system: Secondary | ICD-10-CM | POA: Diagnosis not present

## 2013-02-17 DIAGNOSIS — Z79899 Other long term (current) drug therapy: Secondary | ICD-10-CM | POA: Insufficient documentation

## 2013-02-17 DIAGNOSIS — Z8739 Personal history of other diseases of the musculoskeletal system and connective tissue: Secondary | ICD-10-CM | POA: Insufficient documentation

## 2013-02-17 DIAGNOSIS — Z8719 Personal history of other diseases of the digestive system: Secondary | ICD-10-CM | POA: Insufficient documentation

## 2013-02-17 DIAGNOSIS — Z7982 Long term (current) use of aspirin: Secondary | ICD-10-CM | POA: Insufficient documentation

## 2013-02-17 DIAGNOSIS — R071 Chest pain on breathing: Secondary | ICD-10-CM | POA: Diagnosis not present

## 2013-02-17 DIAGNOSIS — Z87891 Personal history of nicotine dependence: Secondary | ICD-10-CM | POA: Diagnosis not present

## 2013-02-17 DIAGNOSIS — E039 Hypothyroidism, unspecified: Secondary | ICD-10-CM | POA: Diagnosis not present

## 2013-02-17 DIAGNOSIS — J45909 Unspecified asthma, uncomplicated: Secondary | ICD-10-CM | POA: Insufficient documentation

## 2013-02-17 DIAGNOSIS — I10 Essential (primary) hypertension: Secondary | ICD-10-CM | POA: Diagnosis not present

## 2013-02-17 DIAGNOSIS — R0789 Other chest pain: Secondary | ICD-10-CM | POA: Diagnosis not present

## 2013-02-17 DIAGNOSIS — R079 Chest pain, unspecified: Secondary | ICD-10-CM | POA: Diagnosis not present

## 2013-02-17 DIAGNOSIS — R091 Pleurisy: Secondary | ICD-10-CM | POA: Diagnosis not present

## 2013-02-17 LAB — D-DIMER, QUANTITATIVE: D-Dimer, Quant: 0.52 ug/mL-FEU — ABNORMAL HIGH (ref 0.00–0.48)

## 2013-02-17 LAB — BASIC METABOLIC PANEL
BUN: 11 mg/dL (ref 6–23)
CO2: 28 mEq/L (ref 19–32)
Calcium: 9.6 mg/dL (ref 8.4–10.5)
Chloride: 102 mEq/L (ref 96–112)
Creatinine, Ser: 1.05 mg/dL (ref 0.50–1.10)
GFR calc Af Amer: 67 mL/min — ABNORMAL LOW (ref >90)
GFR calc non Af Amer: 57 mL/min — ABNORMAL LOW (ref >90)
Glucose, Bld: 171 mg/dL — ABNORMAL HIGH (ref 70–99)
Potassium: 4.4 mEq/L (ref 3.5–5.1)
Sodium: 141 mEq/L (ref 135–145)

## 2013-02-17 LAB — CBC
HCT: 41.4 % (ref 36.0–46.0)
Hemoglobin: 14 g/dL (ref 12.0–15.0)
MCH: 30.2 pg (ref 26.0–34.0)
MCHC: 33.8 g/dL (ref 30.0–36.0)
MCV: 89.4 fL (ref 78.0–100.0)
Platelets: 258 10*3/uL (ref 150–400)
RBC: 4.63 MIL/uL (ref 3.87–5.11)
RDW: 13.2 % (ref 11.5–15.5)
WBC: 8.9 10*3/uL (ref 4.0–10.5)

## 2013-02-17 LAB — POCT I-STAT TROPONIN I: Troponin i, poc: 0.08 ng/mL (ref 0.00–0.08)

## 2013-02-17 LAB — TROPONIN I: Troponin I: <0.3 ng/mL (ref <0.30)

## 2013-02-17 MED ORDER — IOHEXOL 350 MG/ML SOLN
100.0000 mL | Freq: Once | INTRAVENOUS | Status: AC | PRN
  Administered 2013-02-17: 100 mL via INTRAVENOUS

## 2013-02-17 NOTE — ED Provider Notes (Signed)
History     CSN: [id number]  Arrival date & time 02/17/13  1127   First MD Initiated Contact with Patient 02/17/13 1142      Chief Complaint  Patient presents with  . Chest Pain     Patient is a 57 y.o. female presenting with chest pain. The history is provided by the patient.  Chest Pain Pain location:  R chest Pain quality: pressure   Pain radiates to:  Upper back Pain radiates to the back: yes   Pain severity:  Moderate Onset quality:  Gradual Duration:  1 day Timing:  Constant Progression:  Worsening Chronicity:  New Context: breathing   Relieved by:  Rest Worsened by:  Deep breathing Associated symptoms: no abdominal pain, no cough, no dizziness, no fever, no lower extremity edema, no near-syncope, no shortness of breath, no syncope and no weakness   Risk factors: no prior DVT/PE    Pt reports feeling of "bubble" and pressure in right chest and right upper back.  Worse only with breathing No exertional CP No LE edema No h/o DVT/PE No cough/fever No significant SOB reported It is not associated with eating  Past Medical History  Diagnosis Date  . Arthritis   . Asthma   . Diabetes mellitus   . Heart murmur   . Hypertension   . Thyroid disease     hypothyroidism  . Hemorrhoids     Past Surgical History  Procedure Laterality Date  . Thyroidectomy    . Abdominal hysterectomy    . Total knee arthroplasty      right knee  . Knee surgery      left    No family history on file.  History  Substance Use Topics  . Smoking status: Former Smoker    Quit date: 09/18/2001  . Smokeless tobacco: Not on file  . Alcohol Use: Yes    OB History   Grav Para Term Preterm Abortions TAB SAB Ect Mult Living                  Review of Systems  Constitutional: Negative for fever.  Respiratory: Negative for cough and shortness of breath.   Cardiovascular: Positive for chest pain. Negative for syncope and near-syncope.  Gastrointestinal: Negative for abdominal  pain.  Neurological: Negative for dizziness and weakness.  All other systems reviewed and are negative.    Allergies  Latex; Penicillins; and Warfarin sodium  Home Medications   Current Outpatient Rx  Name  Route  Sig  Dispense  Refill  . aspirin 81 MG tablet   Oral   Take 81 mg by mouth daily.         . fluticasone (FLONASE) 50 MCG/ACT nasal spray   Nasal   Place 2 sprays into the nose daily.   16 g   3   . hydrochlorothiazide (MICROZIDE) 12.5 MG capsule   Oral   Take 1 capsule (12.5 mg total) by mouth daily.   30 capsule   3   . levothyroxine (SYNTHROID, LEVOTHROID) 75 MCG tablet   Oral   Take 1 tablet (75 mcg total) by mouth daily.   30 tablet   3   . lisinopril (PRINIVIL,ZESTRIL) 20 MG tablet   Oral   Take 1 tablet (20 mg total) by mouth daily.   30 tablet   3   . lubiprostone (AMITIZA) 24 MCG capsule   Oral   Take 1 capsule (24 mcg total) by mouth 2 (two) times daily with a  meal.   60 capsule   3   . metFORMIN (GLUCOPHAGE) 1000 MG tablet   Oral   Take 1 tablet (1,000 mg total) by mouth 2 (two) times daily with a meal.   60 tablet   3   . travoprost, benzalkonium, (TRAVATAN) 0.004 % ophthalmic solution   Both Eyes   Place 1 drop into both eyes daily.            BP 159/119  Pulse 96  Temp(Src) 97.9 F (36.6 C) (Oral)  Resp 20  SpO2 100%  Physical Exam CONSTITUTIONAL: Well developed/well nourished HEAD: Normocephalic/atraumatic EYES: EOMI/PERRL ENMT: Mucous membranes moist NECK: supple no meningeal signs SPINE:entire spine nontender CV: S1/S2 noted LUNGS: Lungs are clear to auscultation bilaterally, no apparent distress ABDOMEN: soft, nontender, no rebound or guarding, no RUQ tenderness Chest - nontender.  No tenderness noted to posterior chest GU:no cva tenderness NEURO: Pt is awake/alert, moves all extremitiesx4 EXTREMITIES: pulses normal, full ROM, no edema noted SKIN: warm, color normal PSYCH: no abnormalities of mood  noted  ED Course  Procedures   Labs Reviewed  CBC  BASIC METABOLIC PANEL   16:10 PM Pt with h/o coumadin allergy due to intolerance while taking post op for orthopedic surgery.  She denies h/o DVT/PE 12:52 PM CXR negative Pt with pleuritic pain Will obtain D-dimer I doubt ACS at this time.  Dissection is less likely at this time 4:12 PM  Ct chest negative Will d/c home    MDM  Nursing notes including past medical history and social history reviewed and considered in documentation xrays reviewed and considered Labs/vital reviewed and considered      Date: 02/17/2013  Rate: 94  Rhythm: normal sinus rhythm  QRS Axis: normal  Intervals: QT prolonged but less than 500  ST/T Wave abnormalities: nonspecific ST changes  Conduction Disutrbances:none  Narrative Interpretation:   Old EKG Reviewed: unchanged          Joya Gaskins, MD 02/17/13 [phone]

## 2013-02-17 NOTE — ED Notes (Signed)
Pt feels like she has indigestion or bubble in right side of chest.  Pt states it hurts to take a deep breath.  No diaphoresis. No recent long travel

## 2013-02-17 NOTE — ED Notes (Signed)
Pt reports PCP DR Laural Benes reported she had gallstones in March 2013.

## 2013-02-18 ENCOUNTER — Other Ambulatory Visit: Payer: Self-pay | Admitting: Family Medicine

## 2013-02-18 ENCOUNTER — Telehealth: Payer: Self-pay | Admitting: Family Medicine

## 2013-02-18 MED ORDER — FLUTICASONE PROPIONATE 50 MCG/ACT NA SUSP: 2.0000 | Freq: Every day | NASAL | Status: DC

## 2013-02-18 MED ORDER — LISINOPRIL 20 MG PO TABS: 20.0000 mg | ORAL_TABLET | Freq: Every day | ORAL | Status: DC

## 2013-02-18 NOTE — Telephone Encounter (Signed)
Call patient in for another visit before further refills

## 2013-02-18 NOTE — Telephone Encounter (Signed)
In Epic, chart says pt has 3 refills remaining for hydrocholorthiazide and lisinopril. [facility] on [address] called pt saying they had no more refills.  Pt would like script for both meds sent to [facility] for refill.

## 2013-02-24 ENCOUNTER — Ambulatory Visit: Payer: Medicare Other | Attending: Family Medicine | Admitting: Internal Medicine

## 2013-02-24 VITALS — BP 154/93 | HR 79 | Temp 98.9°F | Resp 17 | Wt 252.2 lb

## 2013-02-24 DIAGNOSIS — R091 Pleurisy: Secondary | ICD-10-CM

## 2013-02-24 DIAGNOSIS — I10 Essential (primary) hypertension: Secondary | ICD-10-CM | POA: Insufficient documentation

## 2013-02-24 MED ORDER — OXYCODONE-ACETAMINOPHEN 5-325 MG PO TABS: 1.0000 | ORAL_TABLET | Freq: Three times a day (TID) | ORAL | Status: DC | PRN

## 2013-02-24 NOTE — Progress Notes (Signed)
Patient ID: Kayla Bean, female   DOB: 1956/09/06, 56 y.o.   MRN: [id number]   CC: Followup  HPI: Patient is 57 year old female who presents to clinic for regular followup. She explains she was seen in emergency department a week ago and was diagnosed with pleurisy and was advised to followup in clinic with primary care provider. She explains her chest discomfort is getting much better, she rates discomfort as 3/10 in severity, intermittent and located in the right side of the chest area, nonradiating, not associated with any specific symptoms of fevers and chills, shortness of breath.  Allergies  Allergen Reactions  . Latex   . Penicillins   . Warfarin Sodium     REACTION: Got rash when took one pill of coumadin  related to knee replacement surgery in 2007.   Past Medical History  Diagnosis Date  . Arthritis   . Asthma   . Diabetes mellitus   . Heart murmur   . Hypertension   . Thyroid disease     hypothyroidism  . Hemorrhoids    Current Outpatient Prescriptions on File Prior to Visit  Medication Sig Dispense Refill  . aspirin 81 MG tablet Take 81 mg by mouth daily.      . fluticasone (FLONASE) 50 MCG/ACT nasal spray Place 2 sprays into the nose daily.  16 g  3  . hydrochlorothiazide (MICROZIDE) 12.5 MG capsule Take 1 capsule (12.5 mg total) by mouth daily.  30 capsule  3  . levothyroxine (SYNTHROID, LEVOTHROID) 75 MCG tablet Take 1 tablet (75 mcg total) by mouth daily.  30 tablet  3  . lisinopril (PRINIVIL,ZESTRIL) 20 MG tablet Take 1 tablet (20 mg total) by mouth daily.  30 tablet  3  . lubiprostone (AMITIZA) 24 MCG capsule Take 1 capsule (24 mcg total) by mouth 2 (two) times daily with a meal.  60 capsule  3  . metFORMIN (GLUCOPHAGE) 1000 MG tablet Take 1 tablet (1,000 mg total) by mouth 2 (two) times daily with a meal.  60 tablet  3  . travoprost, benzalkonium, (TRAVATAN) 0.004 % ophthalmic solution Place 1 drop into both eyes daily.        No current  facility-administered medications on file prior to visit.   Family history of hypertension History   Social History  . Marital Status: Single    Spouse Name: N/A    Number of Children: N/A  . Years of Education: N/A   Occupational History  . Not on file.   Social History Main Topics  . Smoking status: Former Smoker    Quit date: 09/18/2001  . Smokeless tobacco: Not on file  . Alcohol Use: Yes  . Drug Use: No  . Sexually Active:    Other Topics Concern  . Not on file   Social History Narrative  . No narrative on file    Review of Systems  Constitutional: Negative for fever, chills, diaphoresis, activity change, appetite change and fatigue.  HENT: Negative for ear pain, nosebleeds, congestion, facial swelling, rhinorrhea, neck pain, neck stiffness and ear discharge.   Eyes: Negative for pain, discharge, redness, itching and visual disturbance.  Respiratory: Negative for cough, choking, shortness of breath, wheezing and stridor.   Cardiovascular: Negative for palpitations and leg swelling.  Gastrointestinal: Negative for abdominal distention.  Genitourinary: Negative for dysuria, urgency, frequency, hematuria, flank pain, decreased urine volume, difficulty urinating and dyspareunia.  Musculoskeletal: Negative for back pain, joint swelling, arthralgias and gait problem.  Neurological: Negative for dizziness, tremors,  seizures, syncope, facial asymmetry, speech difficulty, weakness, light-headedness, numbness and headaches.  Hematological: Negative for adenopathy. Does not bruise/bleed easily.  Psychiatric/Behavioral: Negative for hallucinations, behavioral problems, confusion, dysphoric mood, decreased concentration and agitation.    Objective:   Filed Vitals:   02/24/13 1659  BP: 154/93  Pulse: 79  Temp: 98.9 F (37.2 C)  Resp: 17    Physical Exam  Constitutional: Appears well-developed and well-nourished. No distress.  CVS: RRR, S1/S2 +, no murmurs, no gallops,  no carotid bruit.  Pulmonary: Effort and breath sounds normal, no stridor, rhonchi, wheezes, rales.  Abdominal: Soft. BS +,  no distension, tenderness, rebound or guarding.  Musculoskeletal: Normal range of motion. No edema and no tenderness.   Lab Results  Component Value Date   WBC 8.9 02/17/2013   HGB 14.0 02/17/2013   HCT 41.4 02/17/2013   MCV 89.4 02/17/2013   PLT 258 02/17/2013   Lab Results  Component Value Date   CREATININE 1.05 02/17/2013   BUN 11 02/17/2013   NA 141 02/17/2013   K 4.4 02/17/2013   CL 102 02/17/2013   CO2 28 02/17/2013    Lab Results  Component Value Date   HGBA1C 7.2* 09/25/2012   Lipid Panel     Component Value Date/Time   CHOL 166 09/25/2012 1254   TRIG 134 09/25/2012 1254   HDL 43 09/25/2012 1254   CHOLHDL 3.9 09/25/2012 1254   VLDL 27 09/25/2012 1254   LDLCALC 96 09/25/2012 1254       Assessment and plan:   Patient Active Problem List   Diagnosis Date Noted  . ESSENTIAL HYPERTENSION, BENIGN -patient was advised to check her blood pressure regularly and to call us back if the numbers are persistently higher than 140/90  08/18/2008  .  pleurisy  - clinically improving, continue analgesia as needed and followup in one month or sooner if symptoms do not get better or get worse  09/04/2007

## 2013-02-24 NOTE — Patient Instructions (Signed)
Pleurisy  Pleurisy is an inflammation and swelling of the lining of the lungs. It usually is the result of an underlying infection or other disease. Because of this inflammation, it hurts to breathe. It is aggravated by coughing or deep breathing. The primary goal in treating pleurisy is to diagnose and treat the condition that caused it.   HOME CARE INSTRUCTIONS   · Only take over-the-counter or prescription medicines for pain, discomfort, or fever as directed by your caregiver.  · If medications which kill germs (antibiotics) were prescribed, take the entire course. Even if you are feeling better, you need to take them.  · Use a cool mist vaporizer to help loosen secretions. This is so the secretions can be coughed up more easily.  SEEK MEDICAL CARE IF:   · Your pain is not controlled with medication or is increasing.  · You have an increase inpus like (purulent) secretions brought up with coughing.  SEEK IMMEDIATE MEDICAL CARE IF:   · You have blue or dark lips, fingernails, or toenails.  · You begin coughing up blood.  · You have increased difficulty breathing.  · You have continuing pain unrelieved by medicine or lasting more than 1 week.  · You have pain that radiates into your neck, arms, or jaw.  · You develop increased shortness of breath or wheezing.  · You develop a fever, rash, vomiting, fainting, or other serious complaints.  Document Released: 09/04/2005 Document Revised: 11/27/2011 Document Reviewed: 04/05/2007  [employer]® Patient Information ©2014 [employer].

## 2013-02-24 NOTE — Progress Notes (Signed)
Patient states was seen in the ED last week Dx pluracy

## 2013-03-13 ENCOUNTER — Emergency Department (HOSPITAL_COMMUNITY)
Admission: EM | Admit: 2013-03-13 | Discharge: 2013-03-13 | Disposition: A | Discharge status: Home / Self Care | Payer: Medicare Other | Attending: Emergency Medicine | Admitting: Emergency Medicine

## 2013-03-13 ENCOUNTER — Encounter (HOSPITAL_COMMUNITY): Payer: Self-pay | Admitting: Emergency Medicine

## 2013-03-13 DIAGNOSIS — E119 Type 2 diabetes mellitus without complications: Secondary | ICD-10-CM | POA: Diagnosis not present

## 2013-03-13 DIAGNOSIS — I10 Essential (primary) hypertension: Secondary | ICD-10-CM

## 2013-03-13 DIAGNOSIS — R140 Abdominal distension (gaseous): Secondary | ICD-10-CM

## 2013-03-13 DIAGNOSIS — E039 Hypothyroidism, unspecified: Secondary | ICD-10-CM | POA: Insufficient documentation

## 2013-03-13 DIAGNOSIS — R141 Gas pain: Secondary | ICD-10-CM | POA: Insufficient documentation

## 2013-03-13 DIAGNOSIS — Z8679 Personal history of other diseases of the circulatory system: Secondary | ICD-10-CM | POA: Insufficient documentation

## 2013-03-13 DIAGNOSIS — Z79899 Other long term (current) drug therapy: Secondary | ICD-10-CM | POA: Insufficient documentation

## 2013-03-13 DIAGNOSIS — Z8739 Personal history of other diseases of the musculoskeletal system and connective tissue: Secondary | ICD-10-CM | POA: Diagnosis not present

## 2013-03-13 DIAGNOSIS — Z7982 Long term (current) use of aspirin: Secondary | ICD-10-CM | POA: Insufficient documentation

## 2013-03-13 DIAGNOSIS — Z87891 Personal history of nicotine dependence: Secondary | ICD-10-CM | POA: Diagnosis not present

## 2013-03-13 DIAGNOSIS — R142 Eructation: Secondary | ICD-10-CM | POA: Insufficient documentation

## 2013-03-13 DIAGNOSIS — J45909 Unspecified asthma, uncomplicated: Secondary | ICD-10-CM | POA: Diagnosis not present

## 2013-03-13 DIAGNOSIS — Z8719 Personal history of other diseases of the digestive system: Secondary | ICD-10-CM | POA: Insufficient documentation

## 2013-03-13 DIAGNOSIS — R143 Flatulence: Secondary | ICD-10-CM | POA: Insufficient documentation

## 2013-03-13 LAB — URINALYSIS, ROUTINE W REFLEX MICROSCOPIC
Bilirubin Urine: NEGATIVE
Glucose, UA: NEGATIVE mg/dL
Hgb urine dipstick: NEGATIVE
Ketones, ur: NEGATIVE mg/dL
Nitrite: NEGATIVE
Protein, ur: NEGATIVE mg/dL
Specific Gravity, Urine: 1.023 (ref 1.005–1.030)
Urobilinogen, UA: 1 mg/dL (ref 0.0–1.0)
pH: 7 (ref 5.0–8.0)

## 2013-03-13 LAB — CBC WITH DIFFERENTIAL/PLATELET
Basophils Absolute: 0.1 10*3/uL (ref 0.0–0.1)
Basophils Relative: 1 % (ref 0–1)
Eosinophils Absolute: 0.4 10*3/uL (ref 0.0–0.7)
Eosinophils Relative: 4 % (ref 0–5)
HCT: 37.6 % (ref 36.0–46.0)
Hemoglobin: 13.6 g/dL (ref 12.0–15.0)
Lymphocytes Relative: 20 % (ref 12–46)
Lymphs Abs: 2.2 10*3/uL (ref 0.7–4.0)
MCH: 31.3 pg (ref 26.0–34.0)
MCHC: 36.2 g/dL — ABNORMAL HIGH (ref 30.0–36.0)
MCV: 86.6 fL (ref 78.0–100.0)
Monocytes Absolute: 0.4 10*3/uL (ref 0.1–1.0)
Monocytes Relative: 4 % (ref 3–12)
Neutro Abs: 7.8 10*3/uL — ABNORMAL HIGH (ref 1.7–7.7)
Neutrophils Relative %: 71 % (ref 43–77)
Platelets: 284 10*3/uL (ref 150–400)
RBC: 4.34 MIL/uL (ref 3.87–5.11)
RDW: 12.9 % (ref 11.5–15.5)
WBC: 11 10*3/uL — ABNORMAL HIGH (ref 4.0–10.5)

## 2013-03-13 LAB — COMPREHENSIVE METABOLIC PANEL
ALT: 11 U/L (ref 0–35)
AST: 16 U/L (ref 0–37)
Albumin: 3.7 g/dL (ref 3.5–5.2)
Alkaline Phosphatase: 125 U/L — ABNORMAL HIGH (ref 39–117)
BUN: 16 mg/dL (ref 6–23)
CO2: 22 mEq/L (ref 19–32)
Calcium: 9.5 mg/dL (ref 8.4–10.5)
Chloride: 102 mEq/L (ref 96–112)
Creatinine, Ser: 1.26 mg/dL — ABNORMAL HIGH (ref 0.50–1.10)
GFR calc Af Amer: 53 mL/min — ABNORMAL LOW (ref >90)
GFR calc non Af Amer: 46 mL/min — ABNORMAL LOW (ref >90)
Glucose, Bld: 151 mg/dL — ABNORMAL HIGH (ref 70–99)
Potassium: 3.8 mEq/L (ref 3.5–5.1)
Sodium: 137 mEq/L (ref 135–145)
Total Bilirubin: 0.3 mg/dL (ref 0.3–1.2)
Total Protein: 8.5 g/dL — ABNORMAL HIGH (ref 6.0–8.3)

## 2013-03-13 LAB — URINE MICROSCOPIC-ADD ON

## 2013-03-13 LAB — POCT I-STAT TROPONIN I: Troponin i, poc: 0.01 ng/mL (ref 0.00–0.08)

## 2013-03-13 LAB — LIPASE, BLOOD: Lipase: 55 U/L (ref 11–59)

## 2013-03-13 NOTE — ED Notes (Signed)
PT. REPORTS GENERALIZED ABDOMINAL PAIN WITH SWELLING AND SOB ONSET LAST NIGHT . DENIES NAUSEA OR VOMITTING .

## 2013-03-13 NOTE — ED Notes (Signed)
Dr. Lavella Lemons at bedside for assessment

## 2013-03-13 NOTE — ED Notes (Signed)
PT comfortable with d/c and f/u instructions.

## 2013-03-13 NOTE — ED Provider Notes (Signed)
History    CSN: [id number] Arrival date & time 03/13/13  0125  First MD Initiated Contact with Patient 03/13/13 [phone]     Chief Complaint  Patient presents with  . Abdominal Pain   (Consider location/radiation/quality/duration/timing/severity/associated sxs/prior Treatment) HPI  Kayla Bean is a 57 yo diabetic woman with HTN. She presents after experiencing a sensation of bloating and distension of the abdomen associated with SOB. Her sx began last night after she ate a large meal. She denies abdominal pain, N/V/D and GU sx. Only previous surgery is remote hysterectomy.   Her sx have improved and she is now asymptomatic. Last BM was approx 20 h ago and the patient has been passing flatus in the ED. she has a history of small bowel obstruction Past Medical History  Diagnosis Date  . Arthritis   . Asthma   . Diabetes mellitus   . Heart murmur   . Hypertension   . Thyroid disease     hypothyroidism  . Hemorrhoids    Past Surgical History  Procedure Laterality Date  . Thyroidectomy    . Abdominal hysterectomy    . Total knee arthroplasty      right knee  . Knee surgery      left   No family history on file. History  Substance Use Topics  . Smoking status: Former Smoker    Quit date: 09/18/2001  . Smokeless tobacco: Not on file  . Alcohol Use: Yes   OB History   Grav Para Term Preterm Abortions TAB SAB Ect Mult Living                 Review of Systems Gen: no weight loss, fevers, chills, night sweats Eyes: no discharge or drainage, no occular pain or visual changes Nose: no epistaxis or rhinorrhea Mouth: no dental pain, no sore throat Neck: no neck pain Lungs: no SOB, cough, wheezing CV: no chest pain, palpitations, dependent edema or orthopnea Abd: As per history of present illness, otherwise negative GU: no dysuria or gross hematuria MSK: no myalgias or arthralgias Neuro: no headache, no focal neurologic deficits Skin: no rash Psyche:  negative.   Allergies  Latex; Penicillins; and Warfarin sodium  Home Medications   Current Outpatient Rx  Name  Route  Sig  Dispense  Refill  . aspirin 81 MG tablet   Oral   Take 81 mg by mouth daily.         . hydrochlorothiazide (MICROZIDE) 12.5 MG capsule   Oral   Take 1 capsule (12.5 mg total) by mouth daily.   30 capsule   3   . levothyroxine (SYNTHROID, LEVOTHROID) 75 MCG tablet   Oral   Take 1 tablet (75 mcg total) by mouth daily.   30 tablet   3   . lisinopril (PRINIVIL,ZESTRIL) 20 MG tablet   Oral   Take 1 tablet (20 mg total) by mouth daily.   30 tablet   3   . lubiprostone (AMITIZA) 24 MCG capsule   Oral   Take 24 mcg by mouth daily.         . metFORMIN (GLUCOPHAGE) 1000 MG tablet   Oral   Take 1 tablet (1,000 mg total) by mouth 2 (two) times daily with a meal.   60 tablet   3   . travoprost, benzalkonium, (TRAVATAN) 0.004 % ophthalmic solution   Both Eyes   Place 1 drop into both eyes daily.           BP  148/94  Pulse 52  Temp(Src) 98.4 F (36.9 C) (Oral)  Resp 20  SpO2 100% Physical Exam Gen: well developed and well nourished appearing Head: NCAT Eyes: PERL, EOMI Nose: no epistaixis or rhinorrhea Mouth/throat: mucosa is moist and pink Neck: supple, no stridor Lungs: CTA B, no wheezing, rhonchi or rales CV: RRR, no murmur, extremities well perfused Abd: soft, notender, nondistended Back: no ttp, no cva ttp Skin: no rashese, wnl Neuro: CN ii-xii grossly intact, no focal deficits Psyche; normal affect,  calm and cooperative.   ED Course  Procedures (including critical care time)  Results for orders placed during the hospital encounter of 03/13/13 (from the past 24 hour(s))  CBC WITH DIFFERENTIAL     Status: Abnormal   Collection Time    03/13/13  1:42 AM      Result Value Range   WBC 11.0 (*) 4.0 - 10.5 K/uL   RBC 4.34  3.87 - 5.11 MIL/uL   Hemoglobin 13.6  12.0 - 15.0 g/dL   HCT 16.1  09.6 - 04.5 %   MCV 86.6  78.0 -  100.0 fL   MCH 31.3  26.0 - 34.0 pg   MCHC 36.2 (*) 30.0 - 36.0 g/dL   RDW 40.9  81.1 - 91.4 %   Platelets 284  150 - 400 K/uL   Neutrophils Relative % 71  43 - 77 %   Neutro Abs 7.8 (*) 1.7 - 7.7 K/uL   Lymphocytes Relative 20  12 - 46 %   Lymphs Abs 2.2  0.7 - 4.0 K/uL   Monocytes Relative 4  3 - 12 %   Monocytes Absolute 0.4  0.1 - 1.0 K/uL   Eosinophils Relative 4  0 - 5 %   Eosinophils Absolute 0.4  0.0 - 0.7 K/uL   Basophils Relative 1  0 - 1 %   Basophils Absolute 0.1  0.0 - 0.1 K/uL  COMPREHENSIVE METABOLIC PANEL     Status: Abnormal   Collection Time    03/13/13  1:42 AM      Result Value Range   Sodium 137  135 - 145 mEq/L   Potassium 3.8  3.5 - 5.1 mEq/L   Chloride 102  96 - 112 mEq/L   CO2 22  19 - 32 mEq/L   Glucose, Bld 151 (*) 70 - 99 mg/dL   BUN 16  6 - 23 mg/dL   Creatinine, Ser 7.82 (*) 0.50 - 1.10 mg/dL   Calcium 9.5  8.4 - 95.6 mg/dL   Total Protein 8.5 (*) 6.0 - 8.3 g/dL   Albumin 3.7  3.5 - 5.2 g/dL   AST 16  0 - 37 U/L   ALT 11  0 - 35 U/L   Alkaline Phosphatase 125 (*) 39 - 117 U/L   Total Bilirubin 0.3  0.3 - 1.2 mg/dL   GFR calc non Af Amer 46 (*) >90 mL/min   GFR calc Af Amer 53 (*) >90 mL/min  LIPASE, BLOOD     Status: None   Collection Time    03/13/13  1:42 AM      Result Value Range   Lipase 55  11 - 59 U/L  URINALYSIS, ROUTINE W REFLEX MICROSCOPIC     Status: Abnormal   Collection Time    03/13/13  1:43 AM      Result Value Range   Color, Urine YELLOW  YELLOW   APPearance CLEAR  CLEAR   Specific Gravity, Urine 1.023  1.005 - 1.030  pH 7.0  5.0 - 8.0   Glucose, UA NEGATIVE  NEGATIVE mg/dL   Hgb urine dipstick NEGATIVE  NEGATIVE   Bilirubin Urine NEGATIVE  NEGATIVE   Ketones, ur NEGATIVE  NEGATIVE mg/dL   Protein, ur NEGATIVE  NEGATIVE mg/dL   Urobilinogen, UA 1.0  0.0 - 1.0 mg/dL   Nitrite NEGATIVE  NEGATIVE   Leukocytes, UA TRACE (*) NEGATIVE  URINE MICROSCOPIC-ADD ON     Status: None   Collection Time    03/13/13  1:43 AM       Result Value Range   Squamous Epithelial / LPF RARE  RARE   WBC, UA 0-2  <3 WBC/hpf   RBC / HPF 0-2  <3 RBC/hpf  POCT I-STAT TROPONIN I     Status: None   Collection Time    03/13/13  1:51 AM      Result Value Range   Troponin i, poc 0.01  0.00 - 0.08 ng/mL   Comment 3              MDM  The patient is status post self-limited episode of abdominal distention. This not accompanied by pain, nausea or vomiting. While it is possible that she experienced a an incomplete small bowel obstruction which decompressed without intervention, I think it's more likely that she was experiencing gas pains. Her emergency department workup is reassuring. Her white blood cell count is mildly elevated at 11,000. The remainder of her labs are unremarkable. Most importantly, she is pain free, tolerating by mouth intake and has an entirely benign abdominal exam. She is asking to go home and I feel that this is a safe plan for her. She agrees to followup with her doctor and to return to the emergency department should she have recurrent symptoms, abdominal pain, fever or any other urgent health concerns.  Kayla Loosen, MD 03/13/13 [phone]

## 2013-03-13 NOTE — ED Notes (Signed)
Pt provided with PO fluid per request of Dr. Lavella Lemons

## 2013-03-19 ENCOUNTER — Ambulatory Visit: Payer: Medicare Other | Attending: Family Medicine | Admitting: Family Medicine

## 2013-03-19 VITALS — BP 132/86 | HR 78 | Temp 98.0°F | Resp 16 | Wt 253.8 lb

## 2013-03-19 DIAGNOSIS — K5909 Other constipation: Secondary | ICD-10-CM

## 2013-03-19 DIAGNOSIS — E039 Hypothyroidism, unspecified: Secondary | ICD-10-CM

## 2013-03-19 DIAGNOSIS — L989 Disorder of the skin and subcutaneous tissue, unspecified: Secondary | ICD-10-CM | POA: Insufficient documentation

## 2013-03-19 DIAGNOSIS — N183 Chronic kidney disease, stage 3 unspecified: Secondary | ICD-10-CM | POA: Insufficient documentation

## 2013-03-19 DIAGNOSIS — J45909 Unspecified asthma, uncomplicated: Secondary | ICD-10-CM | POA: Diagnosis not present

## 2013-03-19 DIAGNOSIS — I10 Essential (primary) hypertension: Secondary | ICD-10-CM

## 2013-03-19 DIAGNOSIS — E1122 Type 2 diabetes mellitus with diabetic chronic kidney disease: Secondary | ICD-10-CM | POA: Insufficient documentation

## 2013-03-19 DIAGNOSIS — E119 Type 2 diabetes mellitus without complications: Secondary | ICD-10-CM | POA: Insufficient documentation

## 2013-03-19 DIAGNOSIS — K8020 Calculus of gallbladder without cholecystitis without obstruction: Secondary | ICD-10-CM

## 2013-03-19 LAB — CBC WITH DIFFERENTIAL/PLATELET
Basophils Absolute: 0 10*3/uL (ref 0.0–0.1)
Basophils Relative: 1 % (ref 0–1)
Eosinophils Absolute: 0.3 10*3/uL (ref 0.0–0.7)
Eosinophils Relative: 4 % (ref 0–5)
HCT: 38 % (ref 36.0–46.0)
Hemoglobin: 13.3 g/dL (ref 12.0–15.0)
Lymphocytes Relative: 22 % (ref 12–46)
Lymphs Abs: 1.8 10*3/uL (ref 0.7–4.0)
MCH: 30 pg (ref 26.0–34.0)
MCHC: 35 g/dL (ref 30.0–36.0)
MCV: 85.6 fL (ref 78.0–100.0)
Monocytes Absolute: 0.4 10*3/uL (ref 0.1–1.0)
Monocytes Relative: 5 % (ref 3–12)
Neutro Abs: 5.6 10*3/uL (ref 1.7–7.7)
Neutrophils Relative %: 68 % (ref 43–77)
Platelets: 271 10*3/uL (ref 150–400)
RBC: 4.44 MIL/uL (ref 3.87–5.11)
RDW: 13.8 % (ref 11.5–15.5)
WBC: 8.2 10*3/uL (ref 4.0–10.5)

## 2013-03-19 LAB — POCT GLYCOSYLATED HEMOGLOBIN (HGB A1C): Hemoglobin A1C: 6.7

## 2013-03-19 LAB — GLUCOSE, POCT (MANUAL RESULT ENTRY): POC Glucose: 100 mg/dl — AB (ref 70–99)

## 2013-03-19 LAB — TSH: TSH: 1.829 u[IU]/mL (ref 0.350–4.500)

## 2013-03-19 MED ORDER — LISINOPRIL 20 MG PO TABS: 20.0000 mg | ORAL_TABLET | Freq: Every day | ORAL | Status: DC

## 2013-03-19 MED ORDER — SULFAMETHOXAZOLE-TMP DS 800-160 MG PO TABS: 1.0000 | ORAL_TABLET | Freq: Two times a day (BID) | ORAL | Status: DC

## 2013-03-19 MED ORDER — METFORMIN HCL 1000 MG PO TABS: 1000.0000 mg | ORAL_TABLET | Freq: Two times a day (BID) | ORAL | Status: DC

## 2013-03-19 MED ORDER — HYDROCHLOROTHIAZIDE 12.5 MG PO CAPS: 12.5000 mg | ORAL_CAPSULE | Freq: Every day | ORAL | Status: DC

## 2013-03-19 MED ORDER — LEVOTHYROXINE SODIUM 75 MCG PO TABS: 75.0000 ug | ORAL_TABLET | Freq: Every day | ORAL | Status: DC

## 2013-03-19 MED ORDER — LUBIPROSTONE 24 MCG PO CAPS: 24.0000 ug | ORAL_CAPSULE | Freq: Every day | ORAL | Status: DC

## 2013-03-19 NOTE — Progress Notes (Signed)
Quick Note:  Pt was informed of results in office visit.   Rodney Langton, MD, CDE, FAAFP [facility] [place], [place]   ______

## 2013-03-19 NOTE — Progress Notes (Signed)
Patient ID: Kayla Bean, female   DOB: 31-Aug-1956, 57 y.o.   MRN: [id number]  CC: follow up diabetes  HPI: Pt reports some boils on skin but getting better.  She needs refills on meds. Having some occasional RUQ pain with fatty meals.  Pt would like to see a surgeon to evaluate.  Pt says that she went to the ER after she had a fatty meal and had severe bloating and pain in the abdomen.  Pt says that she is feeling much better now.  She is taking her medications as prescribed.  Pt says that she has no more vulvar itching.    Allergies  Allergen Reactions  . Latex Hives  . Penicillins Hives  . Warfarin Sodium Hives    REACTION: Got rash when took one pill of coumadin  related to knee replacement surgery in 2007.   Past Medical History  Diagnosis Date  . Arthritis   . Asthma   . Diabetes mellitus   . Heart murmur   . Hypertension   . Thyroid disease     hypothyroidism  . Hemorrhoids    Current Outpatient Prescriptions on File Prior to Visit  Medication Sig Dispense Refill  . aspirin 81 MG tablet Take 81 mg by mouth daily.      . travoprost, benzalkonium, (TRAVATAN) 0.004 % ophthalmic solution Place 1 drop into both eyes daily.        No current facility-administered medications on file prior to visit.   History reviewed. No pertinent family history. History   Social History  . Marital Status: Single    Spouse Name: N/A    Number of Children: N/A  . Years of Education: N/A   Occupational History  . Not on file.   Social History Main Topics  . Smoking status: Former Smoker    Quit date: 09/18/2001  . Smokeless tobacco: Not on file  . Alcohol Use: Yes  . Drug Use: No  . Sexually Active:    Other Topics Concern  . Not on file   Social History Narrative  . No narrative on file    Review of Systems  Constitutional: Negative for fever, chills, diaphoresis, activity change, appetite change and fatigue.  HENT: Negative for ear pain, nosebleeds, congestion, facial  swelling, rhinorrhea, neck pain, neck stiffness and ear discharge.   Eyes: Negative for pain, discharge, redness, itching and visual disturbance.  Respiratory: Negative for cough, choking, chest tightness, shortness of breath, wheezing and stridor.   Cardiovascular: Negative for chest pain, palpitations and leg swelling.  Gastrointestinal: Negative for abdominal distention.  Genitourinary: Negative for dysuria, urgency, frequency, hematuria, flank pain, decreased urine volume, difficulty urinating and dyspareunia.  Musculoskeletal: Negative for back pain, joint swelling, arthralgias and gait problem.  Neurological: Negative for dizziness, tremors, seizures, syncope, facial asymmetry, speech difficulty, weakness, light-headedness, numbness and headaches.  Hematological: Negative for adenopathy. Does not bruise/bleed easily.  Skin: rash noted above and small boils on skin noted. No vulvar itching.  Psychiatric/Behavioral: Negative for hallucinations, behavioral problems, confusion, dysphoric mood, decreased concentration and agitation.    Objective:   Filed Vitals:   03/19/13 1144  BP: 132/86  Pulse: 78  Temp: 98 F (36.7 C)  Resp: 16    Physical Exam  Constitutional: Appears well-developed and well-nourished. No distress.  HENT: Normocephalic. External right and left ear normal. Oropharynx is clear and moist.  Eyes: Conjunctivae and EOM are normal. PERRLA, no scleral icterus.  Neck: Normal ROM. Neck supple. No JVD. No tracheal  deviation. No thyromegaly.  CVS: RRR, S1/S2 +, no murmurs, no gallops, no carotid bruit.  Pulmonary: Effort and breath sounds normal, no stridor, rhonchi, wheezes, rales.  Abdominal: Soft. BS +,  no distension. Mild RUQ tenderness, no rebound or guarding.  Musculoskeletal: Normal range of motion. No edema and no tenderness.  Lymphadenopathy: No lymphadenopathy noted, cervical, inguinal. Neuro: Alert. Normal reflexes, muscle tone coordination. No cranial nerve  deficit. Skin: Skin is warm and dry. Several healed blisters noted on fingers and toes.  Not diaphoretic. No erythema. No pallor.  Psychiatric: Normal mood and affect. Behavior, judgment, thought content normal.   Lab Results  Component Value Date   WBC 11.0* 03/13/2013   HGB 13.6 03/13/2013   HCT 37.6 03/13/2013   MCV 86.6 03/13/2013   PLT 284 03/13/2013   Lab Results  Component Value Date   CREATININE 1.26* 03/13/2013   BUN 16 03/13/2013   NA 137 03/13/2013   K 3.8 03/13/2013   CL 102 03/13/2013   CO2 22 03/13/2013    Lab Results  Component Value Date   HGBA1C 7.2* 09/25/2012   Lipid Panel     Component Value Date/Time   CHOL 166 09/25/2012 1254   TRIG 134 09/25/2012 1254   HDL 43 09/25/2012 1254   CHOLHDL 3.9 09/25/2012 1254   VLDL 27 09/25/2012 1254   LDLCALC 96 09/25/2012 1254     Assessment and plan:   Patient Active Problem List   Diagnosis Date Noted  . Type 2 diabetes mellitus 03/19/2013  . Cholelithiasis 12/24/2012  . LIPOMA 07/26/2010  . SKIN TAG 11/19/2009  . SEBACEOUS CYST 11/19/2009  . CONSTIPATION, CHRONIC 01/18/2009  . EUSTACHIAN TUBE DYSFUNCTION, RIGHT 10/16/2008  . LOM 09/01/2008  . ESSENTIAL HYPERTENSION, BENIGN 08/18/2008  . HYPERTENSION 08/18/2008  . OSTEOARTHRITIS, KNEE, LEFT 04/23/2008  . PLANTAR FASCIITIS, RIGHT 04/23/2008  . HAIR LOSS 12/16/2007  . OTHER ABNORMAL BLOOD CHEMISTRY 12/16/2007  . ELEVATED BP READING WITHOUT DX HYPERTENSION 12/16/2007  . HYPOTHYROIDISM 10/15/2007  . DIABETES MELLITUS, TYPE II 09/04/2007  . ASTHMA 09/04/2007  . ARTHROSCOPY, RIGHT KNEE, HX OF 04/17/2006   Bactrim DS for 5 days as pt has some skin lesions noted Check labs today  Recheck CBC as her WBC was elevated last week.   Follow up lab results  Cholelithiasis - refer to general surgeon to evaluate for cholecystectomy   Follow up in 3 months  The patient was given clear instructions to go to ER or return to medical center if symptoms don't improve, worsen or new  problems develop.  The patient verbalized understanding.  The patient was told to call to get any lab results if not heard anything in the next week.    Rodney Langton, MD, CDE, FAAFP [facility] [place], [place]

## 2013-03-19 NOTE — Patient Instructions (Addendum)
Abscess An abscess is an infected area that contains a collection of pus and debris.It can occur in almost any part of the body. An abscess is also known as a furuncle or boil. CAUSES  An abscess occurs when tissue gets infected. This can occur from blockage of oil or sweat glands, infection of hair follicles, or a minor injury to the skin. As the body tries to fight the infection, pus collects in the area and creates pressure under the skin. This pressure causes pain. People with weakened immune systems have difficulty fighting infections and get certain abscesses more often.  SYMPTOMS Usually an abscess develops on the skin and becomes a painful mass that is red, warm, and tender. If the abscess forms under the skin, you may feel a moveable soft area under the skin. Some abscesses break open (rupture) on their own, but most will continue to get worse without care. The infection can spread deeper into the body and eventually into the bloodstream, causing you to feel ill.  DIAGNOSIS  Your caregiver will take your medical history and perform a physical exam. A sample of fluid may also be taken from the abscess to determine what is causing your infection. TREATMENT  Your caregiver may prescribe antibiotic medicines to fight the infection. However, taking antibiotics alone usually does not cure an abscess. Your caregiver may need to make a small cut (incision) in the abscess to drain the pus. In some cases, gauze is packed into the abscess to reduce pain and to continue draining the area. HOME CARE INSTRUCTIONS   Only take over-the-counter or prescription medicines for pain, discomfort, or fever as directed by your caregiver.  If you were prescribed antibiotics, take them as directed. Finish them even if you start to feel better.  If gauze is used, follow your caregiver's directions for changing the gauze.  To avoid spreading the infection:  Keep your draining abscess covered with a  bandage.  Wash your hands well.  Do not share personal care items, towels, or whirlpools with others.  Avoid skin contact with others.  Keep your skin and clothes clean around the abscess.  Keep all follow-up appointments as directed by your caregiver. SEEK MEDICAL CARE IF:   You have increased pain, swelling, redness, fluid drainage, or bleeding.  You have muscle aches, chills, or a general ill feeling.  You have a fever. MAKE SURE YOU:   Understand these instructions.  Will watch your condition.  Will get help right away if you are not doing well or get worse. Document Released: 06/14/2005 Document Revised: 03/05/2012 Document Reviewed: 11/17/2011 [facility] Patient Information 2014 [place]. Hypothyroidism The thyroid is a large gland located in the lower front of your neck. The thyroid gland helps control metabolism. Metabolism is how your body handles food. It controls metabolism with the hormone thyroxine. When this gland is underactive (hypothyroid), it produces too little hormone.  CAUSES These include:   Absence or destruction of thyroid tissue.  Goiter due to iodine deficiency.  Goiter due to medications.  Congenital defects (since birth).  Problems with the pituitary. This causes a lack of TSH (thyroid stimulating hormone). This hormone tells the thyroid to turn out more hormone. SYMPTOMS  Lethargy (feeling as though you have no energy)  Cold intolerance  Weight gain (in spite of normal food intake)  Dry skin  Coarse hair  Menstrual irregularity (if severe, may lead to infertility)  Slowing of thought processes Cardiac problems are also caused by insufficient amounts of  thyroid hormone. Hypothyroidism in the newborn is cretinism, and is an extreme form. It is important that this form be treated adequately and immediately or it will lead rapidly to retarded physical and mental development. DIAGNOSIS  To prove hypothyroidism, your caregiver may  do blood tests and ultrasound tests. Sometimes the signs are hidden. It may be necessary for your caregiver to watch this illness with blood tests either before or after diagnosis and treatment. TREATMENT  Low levels of thyroid hormone are increased by using synthetic thyroid hormone. This is a safe, effective treatment. It usually takes about four weeks to gain the full effects of the medication. After you have the full effect of the medication, it will generally take another four weeks for problems to leave. Your caregiver may start you on low doses. If you have had heart problems the dose may be gradually increased. It is generally not an emergency to get rapidly to normal. HOME CARE INSTRUCTIONS   Take your medications as your caregiver suggests. Let your caregiver know of any medications you are taking or start taking. Your caregiver will help you with dosage schedules.  As your condition improves, your dosage needs may increase. It will be necessary to have continuing blood tests as suggested by your caregiver.  Report all suspected medication side effects to your caregiver. SEEK MEDICAL CARE IF: Seek medical care if you develop:  Sweating.  Tremulousness (tremors).  Anxiety.  Rapid weight loss.  Heat intolerance.  Emotional swings.  Diarrhea.  Weakness. SEEK IMMEDIATE MEDICAL CARE IF:  You develop chest pain, an irregular heart beat (palpitations), or a rapid heart beat. MAKE SURE YOU:   Understand these instructions.  Will watch your condition.  Will get help right away if you are not doing well or get worse. Document Released: 09/04/2005 Document Revised: 11/27/2011 Document Reviewed: 04/24/2008 [facility] Patient Information 2014 [place], [place]. Blood Sugar Monitoring, Adult GLUCOSE METERS FOR SELF-MONITORING OF BLOOD GLUCOSE  It is important to be able to correctly measure your blood sugar (glucose). You can use a blood glucose monitor (a small battery-operated  device) to check your glucose level at any time. This allows you and your caregiver to monitor your diabetes and to determine how well your treatment plan is working. The process of monitoring your blood glucose with a glucose meter is called self-monitoring of blood glucose (SMBG). When people with diabetes control their blood sugar, they have better health. To test for glucose with a typical glucose meter, place the disposable strip in the meter. Then place a small sample of blood on the "test strip." The test strip is coated with chemicals that combine with glucose in blood. The meter measures how much glucose is present. The meter displays the glucose level as a number. Several new models can record and store a number of test results. Some models can connect to personal computers to store test results or print them out.  Newer meters are often easier to use than older models. Some meters allow you to get blood from places other than your fingertip. Some new models have automatic timing, error codes, signals, or barcode readers to help with proper adjustment (calibration). Some meters have a large display screen or spoken instructions for people with visual impairments.  INSTRUCTIONS FOR USING GLUCOSE METERS  Wash your hands with soap and warm water, or clean the area with alcohol. Dry your hands completely.  Prick the side of your fingertip with a lancet (a sharp-pointed tool used by hand).  Hold the hand down and gently milk the finger until a small drop of blood appears. Catch the blood with the test strip.  Follow the instructions for inserting the test strip and using the SMBG meter. Most meters require the meter to be turned on and the test strip to be inserted before applying the blood sample.  Record the test result.  Read the instructions carefully for both the meter and the test strips that go with it. Meter instructions are found in the user manual. Keep this manual to help you solve any  problems that may arise. Many meters use "error codes" when there is a problem with the meter, the test strip, or the blood sample on the strip. You will need the manual to understand these error codes and fix the problem.  New devices are available such as laser lancets and meters that can test blood taken from "alternative sites" of the body, other than fingertips. However, you should use standard fingertip testing if your glucose changes rapidly. Also, use standard testing if:  You have eaten, exercised, or taken insulin in the past 2 hours.  You think your glucose is low.  You tend to not feel symptoms of low blood glucose (hypoglycemia).  You are ill or under stress.  Clean the meter as directed by the manufacturer.  Test the meter for accuracy as directed by the manufacturer.  Take your meter with you to your caregiver's office. This way, you can test your glucose in front of your caregiver to make sure you are using the meter correctly. Your caregiver can also take a sample of blood to test using a routine lab method. If values on the glucose meter are close to the lab results, you and your caregiver will see that your meter is working well and you are using good technique. Your caregiver will advise you about what to do if the results do not match. FREQUENCY OF TESTING  Your caregiver will tell you how often you should check your blood glucose. This will depend on your type of diabetes, your current level of diabetes control, and your types of medicines. The following are general guidelines, but your care plan may be different. Record all your readings and the time of day you took them for review with your caregiver.   Diabetes type 1.  When you are using insulin with good diabetic control (either multiple daily injections or via a pump), you should check your glucose 4 times a day.  If your diabetes is not well controlled, you may need to monitor more frequently, including before  meals and 2 hours after meals, at bedtime, and occasionally between 2 a.m. and 3 a.m.  You should always check your glucose before a dose of insulin or before changing the rate on your insulin pump.  Diabetes type 2.  Guidelines for SMBG in diabetes type 2 are not as well defined.  If you are on insulin, follow the guidelines above.  If you are on medicines, but not insulin, and your glucose is not well controlled, you should test at least twice daily.  If you are not on insulin, and your diabetes is controlled with medicines or diet alone, you should test at least once daily, usually before breakfast.  A weekly profile will help your caregiver advise you on your care plan. The week before your visit, check your glucose before a meal and 2 hours after a meal at least daily. You may want to test before  and after a different meal each day so you and your caregiver can tell how well controlled your blood sugars are throughout the course of a 24 hour period.  Gestational diabetes (diabetes during pregnancy).  Frequent testing is often necessary. Accurate timing is important.  If you are not on insulin, check your glucose 4 times a day. Check it before breakfast and 1 hour after the start of each meal.  If you are on insulin, check your glucose 6 times a day. Check it before each meal and 1 hour after the first bite of each meal.  General guidelines.  More frequent testing is required at the start of insulin treatment. Your caregiver will instruct you.  Test your glucose any time you suspect you have low blood sugar (hypoglycemia).  You should test more often when you change medicines, when you have unusual stress or illness, or in other unusual circumstances. OTHER THINGS TO KNOW ABOUT GLUCOSE METERS  Measurement Range. Most glucose meters are able to read glucose levels over a broad range of values from as low as 0 to as high as 600 mg/dL. If you get an extremely high or low reading  from your meter, you should first confirm it with another reading. Report very high or very low readings to your caregiver.  Whole Blood Glucose versus Plasma Glucose. Some older home glucose meters measure glucose in your whole blood. In a lab or when using some newer home glucose meters, the glucose is measured in your plasma (one component of blood). The difference can be important. It is important for you and your caregiver to know whether your meter gives its results as "whole blood equivalent" or "plasma equivalent."  Display of High and Low Glucose Values. Part of learning how to operate a meter is understanding what the meter results mean. Know how high and low glucose concentrations are displayed on your meter.  Factors that Affect Glucose Meter Performance. The accuracy of your test results depends on many factors and varies depending on the brand and type of meter. These factors include:  Low red blood cell count (anemia).  Substances in your blood (such as uric acid, vitamin C, and others).  Environmental factors (temperature, humidity, altitude).  Name-brand versus generic test strips.  Calibration. Make sure your meter is set up properly. It is a good idea to do a calibration test with a control solution recommended by the manufacturer of your meter whenever you begin using a fresh bottle of test strips. This will help verify the accuracy of your meter.  Improperly stored, expired, or defective test strips. Keep your strips in a dry place with the lid on.  Soiled meter.  Inadequate blood sample. NEW TECHNOLOGIES FOR GLUCOSE TESTING Alternative site testing Some glucose meters allow testing blood from alternative sites. These include the:  Upper arm.  Forearm.  Base of the thumb.  Thigh. Sampling blood from alternative sites may be desirable. However, it may have some limitations. Blood in the fingertips show changes in glucose levels more quickly than blood in other  parts of the body. This means that alternative site test results may be different from fingertip test results, not because of the meter's ability to test accurately, but because the actual glucose concentration can be different.  Continuous Glucose Monitoring Devices to measure your blood glucose continuously are available, and others are in development. These methods can be more expensive than self-monitoring with a glucose meter. However, it is uncertain how effective and reliable  these devices are. Your caregiver will advise you if this approach makes sense for you. IF BLOOD SUGARS ARE CONTROLLED, PEOPLE WITH DIABETES REMAIN HEALTHIER.  SMBG is an important part of the treatment plan of patients with diabetes mellitus. Below are reasons for using SMBG:   It confirms that your glucose is at a specific, healthy level.  It detects hypoglycemia and severe hyperglycemia.  It allows you and your caregiver to make adjustments in response to changes in lifestyle for individuals requiring medicine.  It determines the need for starting insulin therapy in temporary diabetes that happens during pregnancy (gestational diabetes). Document Released: 09/07/2003 Document Revised: 11/27/2011 Document Reviewed: 12/29/2010 [facility] Patient Information 2014 [place], [place]. Lactose Intolerance, Adult Lactose intolerance is when the body is not able to digest lactose, a sugar found in milk and milk products. Lactose intolerance is caused by your body not producing enough of the enzyme lactase. When there is not enough lactase to digest the amount of lactose consumed, discomfort may be felt. Lactose intolerance is not a milk allergy. For most people, lactase deficiency is a condition that develops naturally over time. After about the age of 2, the body begins to produce less lactase. But many people may not experience symptoms until they are much older. CAUSES Things that can cause you to be lactose intolerant  include:  Aging.  Being born without the ability to make lactase.  Certain digestive diseases.  Injuries to the small intestine. SYMPTOMS   Feeling sick to your stomach (nauseous).  Diarrhea.  Cramps.  Bloating.  Gas. Symptoms usually show up a half hour or 2 hours after eating or drinking products containing lactose. TREATMENT  No treatment can improve the body's ability to produce lactase. However, symptoms can be controlled through diet. A medicine may be given to you to take when you consume lactose-containing foods or drinks. The medicine contains the lactase enzyme, which help the body digest lactose better. HOME CARE INSTRUCTIONS  Eat or drink dairy products as told by your caregiver or dietician.  Take all medicine as directed by your caregiver.  Find lactose-free or lactose-reduced products at your local grocery store.  Talk to your caregiver or dietician to decide if you need any dietary supplements. The following is the amount of calcium needed from the diet:  19 to 50 years: 1000 mg  Over 50 years: 1200 mg Calcium and Lactose in Common Foods Non-Dairy Products / Calcium Content (mg)  Calcium-fortified orange juice, 1 cup / 308 to 344 mg  Sardines, with edible bones, 3 oz / 270 mg  Salmon, canned, with edible bones, 3 oz / 205 mg  Soymilk, fortified, 1 cup / 200 mg  Broccoli (raw), 1 cup / 90 mg  Orange, 1 medium / 50 mg  Pinto beans,  cup / 40 mg  Tuna, canned, 3 oz / 10 mg  Lettuce greens,  cup / 10 mg Dairy Products / Calcium Content (mg) / Lactose Content (g)  Yogurt, plain, low-fat, 1 cup / 415 mg / 5 g  Milk, reduced fat, 1 cup / 295 mg / 11 g  Swiss cheese, 1 oz / 270 mg / 1 g  Ice cream,  cup / 85 mg / 6 g  Cottage cheese,  cup / 75 mg / 2 to 3 g SEEK MEDICAL CARE IF: You have no relief from your symptoms. Document Released: 09/04/2005 Document Revised: 11/27/2011 Document Reviewed: 12/02/2010 [facility] Patient Information  2014 [place], [place]. Cholelithiasis Cholelithiasis (also called gallstones)  is a form of gallbladder disease where gallstones form in your gallbladder. The gallbladder is a non-essential organ that stores bile made in the liver, which helps digest fats. Gallstones begin as small crystals and slowly grow into stones. Gallstone pain occurs when the gallbladder spasms, and a gallstone is blocking the duct. Pain can also occur when a stone passes out of the duct.  Women are more likely to develop gallstones than men. Other factors that increase the risk of gallbladder disease are:  Having multiple pregnancies. Physicians sometimes advise removing diseased gallbladders before future pregnancies.  Obesity.  Diets heavy in fried foods and fat.  Increasing age (older than 17).  Prolonged use of medications containing female hormones.  Diabetes mellitus.  Rapid weight loss.  Family history of gallstones (heredity). SYMPTOMS  Feeling sick to your stomach (nauseous).  Abdominal pain.  Yellowing of the skin (jaundice).  Sudden pain. It may persist from several minutes to several hours.  Worsening pain with deep breathing or when jarred.  Fever.  Tenderness to the touch. In some cases, when gallstones do not move into the bile duct, people have no pain or symptoms. These are called "silent" gallstones. TREATMENT In severe cases, emergency surgery may be required. HOME CARE INSTRUCTIONS   Only take over-the-counter or prescription medicines for pain, discomfort, or fever as directed by your caregiver.  Follow a low-fat diet until seen again. Fat causes the gallbladder to contract, which can result in pain.  Follow up as instructed. Attacks are almost always recurrent and surgery is usually required for permanent treatment. SEEK IMMEDIATE MEDICAL CARE IF:   Your pain increases and is not controlled by medications.  You have an oral temperature above 102 F (38.9 C), not controlled  by medication.  You develop nausea and vomiting. MAKE SURE YOU:   Understand these instructions.  Will watch your condition.  Will get help right away if you are not doing well or get worse. Document Released: 08/31/2005 Document Revised: 11/27/2011 Document Reviewed: 11/03/2010 [facility] Patient Information 2014 [place], [place].

## 2013-03-19 NOTE — Progress Notes (Signed)
Patient states had a test with contrast Since than has broke out with rash on hands and feet Some bumps in her vaginal area as well

## 2013-03-20 LAB — COMPLETE METABOLIC PANEL WITH GFR
ALT: 9 U/L (ref 0–35)
AST: 14 U/L (ref 0–37)
Albumin: 3.9 g/dL (ref 3.5–5.2)
Alkaline Phosphatase: 93 U/L (ref 39–117)
BUN: 12 mg/dL (ref 6–23)
CO2: 25 mEq/L (ref 19–32)
Calcium: 9.2 mg/dL (ref 8.4–10.5)
Chloride: 107 mEq/L (ref 96–112)
Creat: 1.21 mg/dL — ABNORMAL HIGH (ref 0.50–1.10)
GFR, Est African American: 57 mL/min — ABNORMAL LOW
GFR, Est Non African American: 49 mL/min — ABNORMAL LOW
Glucose, Bld: 99 mg/dL (ref 70–99)
Potassium: 4.3 mEq/L (ref 3.5–5.3)
Sodium: 142 mEq/L (ref 135–145)
Total Bilirubin: 0.5 mg/dL (ref 0.3–1.2)
Total Protein: 7.7 g/dL (ref 6.0–8.3)

## 2013-03-20 LAB — MICROALBUMIN / CREATININE URINE RATIO
Creatinine, Urine: 129.5 mg/dL
Microalb Creat Ratio: 3.9 mg/g (ref 0.0–30.0)
Microalb, Ur: 0.5 mg/dL (ref 0.00–1.89)

## 2013-03-20 NOTE — Progress Notes (Signed)
Quick Note:  Please inform patient that her labs have been stable. Her white blood cell count has returned to normal.   Rodney Langton, MD, CDE, FAAFP [facility] [place], [place]   ______

## 2013-03-24 DIAGNOSIS — M1710 Unilateral primary osteoarthritis, unspecified knee: Secondary | ICD-10-CM | POA: Diagnosis not present

## 2013-03-24 DIAGNOSIS — M7710 Lateral epicondylitis, unspecified elbow: Secondary | ICD-10-CM | POA: Diagnosis not present

## 2013-03-26 ENCOUNTER — Telehealth: Payer: Self-pay

## 2013-03-26 NOTE — Telephone Encounter (Signed)
Patient not available Left message to return our call 

## 2013-03-26 NOTE — Telephone Encounter (Signed)
Message copied by Lestine Mount on Wed Mar 26, 2013 11:28 AM ------      Message from: Cleora Fleet      Created: Thu Mar 20, 2013  8:57 AM       Please inform patient that her labs have been stable.  Her white blood cell count has returned to normal.                  Rodney Langton, MD, CDE, FAAFP      [facility]      [facility]      [place], [place]        ------

## 2013-03-26 NOTE — Telephone Encounter (Signed)
Pt calling back

## 2013-04-14 ENCOUNTER — Telehealth (INDEPENDENT_AMBULATORY_CARE_PROVIDER_SITE_OTHER): Payer: Self-pay | Admitting: General Surgery

## 2013-04-14 ENCOUNTER — Encounter (INDEPENDENT_AMBULATORY_CARE_PROVIDER_SITE_OTHER): Payer: Self-pay | Admitting: Surgery

## 2013-04-14 ENCOUNTER — Ambulatory Visit (INDEPENDENT_AMBULATORY_CARE_PROVIDER_SITE_OTHER): Payer: Medicare Other | Admitting: Surgery

## 2013-04-14 VITALS — BP 122/80 | HR 62 | Resp 16 | Ht 62.0 in | Wt 251.4 lb

## 2013-04-14 DIAGNOSIS — R1031 Right lower quadrant pain: Secondary | ICD-10-CM | POA: Diagnosis not present

## 2013-04-14 DIAGNOSIS — G8929 Other chronic pain: Secondary | ICD-10-CM

## 2013-04-14 NOTE — Telephone Encounter (Signed)
LMOM asking pt to return my call.  This is in regards to me needing to let her know that she has a CT abd pelvis scheduled for 04/16/13 at 11:45 located at [address] ([place] [facility]).  She would need to drink the first bottle of contrast at 10:00 and the second at 11:00 with not eating solid foods 4 hours prior to the imaging.

## 2013-04-14 NOTE — Patient Instructions (Addendum)

## 2013-04-14 NOTE — Progress Notes (Signed)
Patient ID: Kayla Bean, female   DOB: Feb 25, 1956, 57 y.o.   MRN: [id number]  Chief Complaint  Patient presents with  . New Evaluation    eval GB    HPI Kayla Bean is a 57 y.o. female.  Patient presents with history of chronic right lower quadrant abdominal pain for 5 months. Sharp in nature. Location right lower quadrant with radiation toward her back. 8/10.  No pain eating fatty or fried foods. History of constipation but regular now. No blood in stool. Last colonoscopy 2007. No  weight loss. Pain not made worse with activity. HPI  Past Medical History  Diagnosis Date  . Arthritis   . Asthma   . Diabetes mellitus   . Heart murmur   . Hypertension   . Thyroid disease     hypothyroidism  . Hemorrhoids     Past Surgical History  Procedure Laterality Date  . Thyroidectomy    . Abdominal hysterectomy    . Total knee arthroplasty      right knee  . Knee surgery      left    History reviewed. No pertinent family history.  Social History History  Substance Use Topics  . Smoking status: Former Smoker    Quit date: 09/18/2001  . Smokeless tobacco: Not on file  . Alcohol Use: Yes    Allergies  Allergen Reactions  . Latex Hives  . Penicillins Hives  . Warfarin Sodium Hives    REACTION: Got rash when took one pill of coumadin  related to knee replacement surgery in 2007.    Current Outpatient Prescriptions  Medication Sig Dispense Refill  . aspirin 81 MG tablet Take 81 mg by mouth daily.      . hydrochlorothiazide (MICROZIDE) 12.5 MG capsule Take 1 capsule (12.5 mg total) by mouth daily.  30 capsule  4  . levothyroxine (SYNTHROID, LEVOTHROID) 75 MCG tablet Take 1 tablet (75 mcg total) by mouth daily.  30 tablet  4  . lisinopril (PRINIVIL,ZESTRIL) 20 MG tablet Take 1 tablet (20 mg total) by mouth daily.  30 tablet  4  . lubiprostone (AMITIZA) 24 MCG capsule Take 1 capsule (24 mcg total) by mouth daily.  30 capsule  4  . meloxicam (MOBIC) 15 MG tablet        . metFORMIN (GLUCOPHAGE) 1000 MG tablet Take 1 tablet (1,000 mg total) by mouth 2 (two) times daily with a meal.  60 tablet  4  . oxyCODONE-acetaminophen (PERCOCET/ROXICET) 5-325 MG per tablet       . travoprost, benzalkonium, (TRAVATAN) 0.004 % ophthalmic solution Place 1 drop into both eyes daily.        No current facility-administered medications for this visit.    Review of Systems Review of Systems  Constitutional: Negative.   HENT: Negative.   Eyes: Negative.   Respiratory: Positive for shortness of breath.   Cardiovascular: Positive for leg swelling. Negative for chest pain.  Gastrointestinal: Positive for vomiting, abdominal pain, diarrhea and constipation.  Endocrine: Negative.   Genitourinary: Negative.   Musculoskeletal: Positive for arthralgias.  Skin: Negative.   Allergic/Immunologic: Negative.   Neurological: Negative.   Hematological: Negative.   Psychiatric/Behavioral: Negative.     Blood pressure 122/80, pulse 62, resp. rate 16, height 5\' 2"  (1.575 m), weight 251 lb 6.4 oz (114.034 kg).  Physical Exam Physical Exam  Constitutional: She is oriented to person, place, and time. She appears well-developed and well-nourished.  HENT:  Head: Normocephalic and atraumatic.  Eyes: EOM  are normal. Pupils are equal, round, and reactive to light.  Neck: Normal range of motion. Neck supple.  Pulmonary/Chest: Effort normal. She has wheezes.  Abdominal: There is tenderness in the right lower quadrant. There is no rigidity, no rebound, no guarding and no tenderness at McBurney's point.  Musculoskeletal: Normal range of motion.  Neurological: She is alert and oriented to person, place, and time.  Skin: Skin is warm and dry.  Psychiatric: She has a normal mood and affect. Her behavior is normal. Judgment and thought content normal.    Data Reviewed Acute abdominal series 09/2012 shows constipation and gallstones  Assessment    Chronic right lower quadrant abdominal  pain  Patient Active Problem List   Diagnosis Date Noted  . Type 2 diabetes mellitus 03/19/2013  . Cholelithiasis 12/24/2012  . LIPOMA 07/26/2010  . SKIN TAG 11/19/2009  . SEBACEOUS CYST 11/19/2009  . CONSTIPATION, CHRONIC 01/18/2009  . EUSTACHIAN TUBE DYSFUNCTION, RIGHT 10/16/2008  . LOM 09/01/2008  . ESSENTIAL HYPERTENSION, BENIGN 08/18/2008  . HYPERTENSION 08/18/2008  . OSTEOARTHRITIS, KNEE, LEFT 04/23/2008  . PLANTAR FASCIITIS, RIGHT 04/23/2008  . HAIR LOSS 12/16/2007  . OTHER ABNORMAL BLOOD CHEMISTRY 12/16/2007  . ELEVATED BP READING WITHOUT DX HYPERTENSION 12/16/2007  . HYPOTHYROIDISM 10/15/2007  . DIABETES MELLITUS, TYPE II 09/04/2007  . ASTHMA 09/04/2007  . ARTHROSCOPY, RIGHT KNEE, HX OF 04/17/2006      Plan    Symptoms are not typical of gallstone issues. Check CT scan abdomen pelvis. If normal may require colonoscopy. Further plans once the above imaging is done.       Jeovani Weisenburger A. 04/14/2013, 11:15 AM

## 2013-04-14 NOTE — Telephone Encounter (Signed)
Pt returned my phone call and I gave her the CT imaging information.

## 2013-04-16 ENCOUNTER — Ambulatory Visit
Admission: RE | Admit: 2013-04-16 | Discharge: 2013-04-16 | Disposition: A | Discharge status: Home / Self Care | Payer: Medicare Other | Source: Ambulatory Visit | Attending: Surgery | Admitting: Surgery

## 2013-04-16 ENCOUNTER — Inpatient Hospital Stay: Admission: RE | Admit: 2013-04-16 | Payer: Medicare Other | Source: Ambulatory Visit

## 2013-04-16 DIAGNOSIS — R1031 Right lower quadrant pain: Secondary | ICD-10-CM

## 2013-04-16 DIAGNOSIS — G8929 Other chronic pain: Secondary | ICD-10-CM

## 2013-04-23 ENCOUNTER — Ambulatory Visit
Admission: RE | Admit: 2013-04-23 | Discharge: 2013-04-23 | Disposition: A | Discharge status: Home / Self Care | Payer: Medicare Other | Source: Ambulatory Visit | Attending: Surgery | Admitting: Surgery

## 2013-04-23 ENCOUNTER — Inpatient Hospital Stay: Admission: RE | Admit: 2013-04-23 | Payer: Medicare Other | Source: Ambulatory Visit

## 2013-04-23 ENCOUNTER — Other Ambulatory Visit (INDEPENDENT_AMBULATORY_CARE_PROVIDER_SITE_OTHER): Payer: Self-pay | Admitting: Surgery

## 2013-04-23 DIAGNOSIS — G8929 Other chronic pain: Secondary | ICD-10-CM

## 2013-04-23 DIAGNOSIS — R1031 Right lower quadrant pain: Secondary | ICD-10-CM

## 2013-04-23 DIAGNOSIS — K8020 Calculus of gallbladder without cholecystitis without obstruction: Secondary | ICD-10-CM | POA: Diagnosis not present

## 2013-04-23 MED ORDER — IOHEXOL 300 MG/ML  SOLN
125.0000 mL | Freq: Once | INTRAMUSCULAR | Status: AC | PRN
  Administered 2013-04-23: 125 mL via INTRAVENOUS

## 2013-04-24 ENCOUNTER — Telehealth (INDEPENDENT_AMBULATORY_CARE_PROVIDER_SITE_OTHER): Payer: Self-pay

## 2013-04-24 ENCOUNTER — Other Ambulatory Visit (INDEPENDENT_AMBULATORY_CARE_PROVIDER_SITE_OTHER): Payer: Self-pay

## 2013-04-24 DIAGNOSIS — H409 Unspecified glaucoma: Secondary | ICD-10-CM | POA: Diagnosis not present

## 2013-04-24 DIAGNOSIS — H4011X0 Primary open-angle glaucoma, stage unspecified: Secondary | ICD-10-CM | POA: Diagnosis not present

## 2013-04-24 DIAGNOSIS — G8929 Other chronic pain: Secondary | ICD-10-CM

## 2013-04-24 NOTE — Telephone Encounter (Signed)
LMOM. Dr Luisa Hart wants pt to get in with GI for colonoscopy.

## 2013-04-24 NOTE — Telephone Encounter (Signed)
Message copied by Brennan Bailey on Thu Apr 24, 2013  3:50 PM ------      Message from: Harriette Bouillon A      Created: Wed Apr 23, 2013  4:31 PM       Needs referral to GI for colonoscopy and follow up with me after. ------

## 2013-04-25 ENCOUNTER — Telehealth (INDEPENDENT_AMBULATORY_CARE_PROVIDER_SITE_OTHER): Payer: Self-pay | Admitting: General Surgery

## 2013-04-25 NOTE — Telephone Encounter (Signed)
Spoke with pt and informed her that she has an appt w/ Dr. Elnoria Howard on 04/28/13 at 11:00 w/ arrival time of 10:45.

## 2013-04-28 DIAGNOSIS — R1031 Right lower quadrant pain: Secondary | ICD-10-CM | POA: Diagnosis not present

## 2013-04-28 DIAGNOSIS — Z86010 Personal history of colonic polyps: Secondary | ICD-10-CM | POA: Diagnosis not present

## 2013-05-15 ENCOUNTER — Encounter (INDEPENDENT_AMBULATORY_CARE_PROVIDER_SITE_OTHER): Payer: Medicare Other | Admitting: Surgery

## 2013-05-23 ENCOUNTER — Encounter (INDEPENDENT_AMBULATORY_CARE_PROVIDER_SITE_OTHER): Payer: Self-pay | Admitting: Surgery

## 2013-05-23 ENCOUNTER — Ambulatory Visit (INDEPENDENT_AMBULATORY_CARE_PROVIDER_SITE_OTHER): Payer: Medicare Other | Admitting: Surgery

## 2013-05-23 VITALS — BP 120/80 | HR 60 | Temp 96.9°F | Resp 14 | Ht 62.0 in | Wt 251.4 lb

## 2013-05-23 DIAGNOSIS — R1031 Right lower quadrant pain: Secondary | ICD-10-CM

## 2013-05-23 DIAGNOSIS — G8929 Other chronic pain: Secondary | ICD-10-CM | POA: Diagnosis not present

## 2013-05-23 DIAGNOSIS — R599 Enlarged lymph nodes, unspecified: Secondary | ICD-10-CM

## 2013-05-23 DIAGNOSIS — R591 Generalized enlarged lymph nodes: Secondary | ICD-10-CM | POA: Insufficient documentation

## 2013-05-23 NOTE — Progress Notes (Signed)
Patient returns for followup of her right lower quadrant pain. She was seen by her gastroenterologist and is up-to-date on her most recent colonoscopy from 2010. She's placed on ibuprofen her pain improved. This is felt to be secondary to musculoskeletal. CT scan showed mild pelvic lymphadenopathy and inguinal lymphadenopathy. She states this was noted on her colonoscopy in 2010. She is now pain-free.  Exam: Abdomen is benign. Soft nontender.  Impression: Abdominal pain probable musculoskeletal in origin  Cholelithiasis asymptomatic  Pelvic lymphadenopathy and inguinal lymphadenopathy minimal  Plan: Recommend three-month followup for lymphadenopathy in the office. No further work up of abdominal pain needed at this point in time. Cholecystectomy not indicated at this point.

## 2013-05-23 NOTE — Patient Instructions (Addendum)
Return 3 months for lymph node check

## 2013-05-26 DIAGNOSIS — Z86010 Personal history of colonic polyps: Secondary | ICD-10-CM | POA: Diagnosis not present

## 2013-05-26 DIAGNOSIS — R1031 Right lower quadrant pain: Secondary | ICD-10-CM | POA: Diagnosis not present

## 2013-06-19 DIAGNOSIS — Z23 Encounter for immunization: Secondary | ICD-10-CM | POA: Diagnosis not present

## 2013-06-27 DIAGNOSIS — M1710 Unilateral primary osteoarthritis, unspecified knee: Secondary | ICD-10-CM | POA: Diagnosis not present

## 2013-07-11 DIAGNOSIS — H04129 Dry eye syndrome of unspecified lacrimal gland: Secondary | ICD-10-CM | POA: Diagnosis not present

## 2013-07-11 DIAGNOSIS — H18419 Arcus senilis, unspecified eye: Secondary | ICD-10-CM | POA: Diagnosis not present

## 2013-07-11 DIAGNOSIS — H11159 Pinguecula, unspecified eye: Secondary | ICD-10-CM | POA: Diagnosis not present

## 2013-07-11 DIAGNOSIS — H4011X0 Primary open-angle glaucoma, stage unspecified: Secondary | ICD-10-CM | POA: Diagnosis not present

## 2013-08-26 ENCOUNTER — Ambulatory Visit (INDEPENDENT_AMBULATORY_CARE_PROVIDER_SITE_OTHER): Payer: Medicare Other | Admitting: Surgery

## 2013-09-17 ENCOUNTER — Ambulatory Visit: Payer: Medicare Other | Attending: Internal Medicine | Admitting: Internal Medicine

## 2013-09-17 VITALS — BP 144/87 | HR 89 | Temp 99.3°F | Resp 14 | Ht 62.0 in | Wt 259.0 lb

## 2013-09-17 DIAGNOSIS — E119 Type 2 diabetes mellitus without complications: Secondary | ICD-10-CM | POA: Diagnosis not present

## 2013-09-17 DIAGNOSIS — L678 Other hair color and hair shaft abnormalities: Secondary | ICD-10-CM | POA: Insufficient documentation

## 2013-09-17 DIAGNOSIS — L738 Other specified follicular disorders: Secondary | ICD-10-CM | POA: Diagnosis not present

## 2013-09-17 LAB — POCT GLYCOSYLATED HEMOGLOBIN (HGB A1C): Hemoglobin A1C: 7

## 2013-09-17 LAB — GLUCOSE, POCT (MANUAL RESULT ENTRY): POC Glucose: 114 mg/dl — AB (ref 70–99)

## 2013-09-17 MED ORDER — DOXYCYCLINE HYCLATE 100 MG PO TABS: 100.0000 mg | ORAL_TABLET | Freq: Two times a day (BID) | ORAL | Status: DC

## 2013-09-17 MED ORDER — FREESTYLE LANCETS MISC: Status: DC

## 2013-09-17 MED ORDER — BACITRACIN-NEOMYCIN-POLYMYXIN 400-5-5000 EX OINT: 1.0000 "application " | TOPICAL_OINTMENT | Freq: Two times a day (BID) | CUTANEOUS | Status: DC

## 2013-09-17 NOTE — Progress Notes (Signed)
Pt has been having pimples on her breast. One under her right breast has become inflamed and hurts to touch.

## 2013-09-17 NOTE — Progress Notes (Signed)
Patient ID: Kayla Bean, female   DOB: [birth year], 57 y.o.   MRN: [id number]   CC:  HPI: 57 year old female with history of hypertension, diabetes who presents for evaluation of folliculitis on her right breast. She noticed it last week. She states that that was inflamed and had a halo around it. It has not had any drainage. She denies any fever. She did not check a CBG a regular basis and does not know if her CBG has been running high.  Allergies  Allergen Reactions  . Latex Hives  . Penicillins Hives  . Warfarin Sodium Hives    REACTION: Got rash when took one pill of coumadin  related to knee replacement surgery in 2007.  . Iodinated Diagnostic Agents Other (See Comments)    PT STATES SHE HAD AN ALLERGIC REACTION OF BLISTERS ON HANDS AND FEET 2 DAYS AFTER CT SCAN W/ IV CONTRAST INJECTION, DR. MANSELL SUGGESTS 13 HR PREP//A.C.   Past Medical History  Diagnosis Date  . Arthritis   . Asthma   . Diabetes mellitus   . Heart murmur   . Hypertension   . Thyroid disease     hypothyroidism  . Hemorrhoids    Current Outpatient Prescriptions on File Prior to Visit  Medication Sig Dispense Refill  . aspirin 81 MG tablet Take 81 mg by mouth daily.      . hydrochlorothiazide (MICROZIDE) 12.5 MG capsule Take 1 capsule (12.5 mg total) by mouth daily.  30 capsule  4  . levothyroxine (SYNTHROID, LEVOTHROID) 75 MCG tablet Take 1 tablet (75 mcg total) by mouth daily.  30 tablet  4  . lisinopril (PRINIVIL,ZESTRIL) 20 MG tablet Take 1 tablet (20 mg total) by mouth daily.  30 tablet  4  . lubiprostone (AMITIZA) 24 MCG capsule Take 1 capsule (24 mcg total) by mouth daily.  30 capsule  4  . metFORMIN (GLUCOPHAGE) 1000 MG tablet Take 1 tablet (1,000 mg total) by mouth 2 (two) times daily with a meal.  60 tablet  4  . oxyCODONE-acetaminophen (PERCOCET/ROXICET) 5-325 MG per tablet       . travoprost, benzalkonium, (TRAVATAN) 0.004 % ophthalmic solution Place 1 drop into both eyes daily.       .  meloxicam (MOBIC) 15 MG tablet        No current facility-administered medications on file prior to visit.   History reviewed. No pertinent family history. History   Social History  . Marital Status: Single    Spouse Name: N/A    Number of Children: N/A  . Years of Education: N/A   Occupational History  . Not on file.   Social History Main Topics  . Smoking status: Former Smoker    Quit date: 09/18/2001  . Smokeless tobacco: Not on file  . Alcohol Use: Yes  . Drug Use: No  . Sexual Activity:    Other Topics Concern  . Not on file   Social History Narrative  . No narrative on file    Review of Systems  Constitutional: Negative for fever, chills, diaphoresis, activity change, appetite change and fatigue.  HENT: Negative for ear pain, nosebleeds, congestion, facial swelling, rhinorrhea, neck pain, neck stiffness and ear discharge.   Eyes: Negative for pain, discharge, redness, itching and visual disturbance.  Respiratory: Negative for cough, choking, chest tightness, shortness of breath, wheezing and stridor.   Cardiovascular: Negative for chest pain, palpitations and leg swelling.  Gastrointestinal: Negative for abdominal distention.  Genitourinary: Negative for dysuria, urgency, frequency,  hematuria, flank pain, decreased urine volume, difficulty urinating and dyspareunia.  Musculoskeletal: Negative for back pain, joint swelling, arthralgias and gait problem.  Neurological: Negative for dizziness, tremors, seizures, syncope, facial asymmetry, speech difficulty, weakness, light-headedness, numbness and headaches.  Hematological: Negative for adenopathy. Does not bruise/bleed easily.  Psychiatric/Behavioral: Negative for hallucinations, behavioral problems, confusion, dysphoric mood, decreased concentration and agitation.    Objective:   Filed Vitals:   09/17/13 1515  BP: 144/87  Pulse: 89  Temp: 99.3 F (37.4 C)  Resp: 14    Physical Exam  Constitutional:  Appears well-developed and well-nourished. No distress.  HENT: Normocephalic. External right and left ear normal. Oropharynx is clear and moist.  Eyes: Conjunctivae and EOM are normal. PERRLA, no scleral icterus.  Neck: Normal ROM. Neck supple. No JVD. No tracheal deviation. No thyromegaly.  CVS: RRR, S1/S2 +, no murmurs, no gallops, no carotid bruit.  Pulmonary: Effort and breath sounds normal, no stridor, rhonchi, wheezes, rales.  Abdominal: Soft. BS +,  no distension, tenderness, rebound or guarding.  Musculoskeletal: Normal range of motion. No edema and no tenderness.  Lymphadenopathy: No lymphadenopathy noted, cervical, inguinal. Neuro: Alert. Normal reflexes, muscle tone coordination. No cranial nerve deficit. Skin: Skin is warm and dry. No rash noted. Not diaphoretic. No erythema. No pallor.  Psychiatric: Normal mood and affect. Behavior, judgment, thought content normal.   Lab Results  Component Value Date   WBC 8.2 03/19/2013   HGB 13.3 03/19/2013   HCT 38.0 03/19/2013   MCV 85.6 03/19/2013   PLT 271 03/19/2013   Lab Results  Component Value Date   CREATININE 1.21* 03/19/2013   BUN 12 03/19/2013   NA 142 03/19/2013   K 4.3 03/19/2013   CL 107 03/19/2013   CO2 25 03/19/2013    Lab Results  Component Value Date   HGBA1C 6.7% 03/19/2013   Lipid Panel     Component Value Date/Time   CHOL 166 09/25/2012 1254   TRIG 134 09/25/2012 1254   HDL 43 09/25/2012 1254   CHOLHDL 3.9 09/25/2012 1254   VLDL 27 09/25/2012 1254   LDLCALC 96 09/25/2012 1254       Assessment and plan:   Patient Active Problem List   Diagnosis Date Noted  . Abdominal pain, chronic, right lower quadrant 05/23/2013  . Lymphadenopathy 05/23/2013  . Type 2 diabetes mellitus 03/19/2013  . Cholelithiasis 12/24/2012  . LIPOMA 07/26/2010  . SKIN TAG 11/19/2009  . SEBACEOUS CYST 11/19/2009  . CONSTIPATION, CHRONIC 01/18/2009  . EUSTACHIAN TUBE DYSFUNCTION, RIGHT 10/16/2008  . LOM 09/01/2008  . ESSENTIAL HYPERTENSION, BENIGN  08/18/2008  . HYPERTENSION 08/18/2008  . OSTEOARTHRITIS, KNEE, LEFT 04/23/2008  . PLANTAR FASCIITIS, RIGHT 04/23/2008  . HAIR LOSS 12/16/2007  . OTHER ABNORMAL BLOOD CHEMISTRY 12/16/2007  . ELEVATED BP READING WITHOUT DX HYPERTENSION 12/16/2007  . HYPOTHYROIDISM 10/15/2007  . DIABETES MELLITUS, TYPE II 09/04/2007  . ASTHMA 09/04/2007  . ARTHROSCOPY, RIGHT KNEE, HX OF 04/17/2006    Folliculitis  right breast Doxycycline for 10 days Topical Neosporin ointment Warm compresses Return of the patient develops fever, increasing erythema, increasing breast pain    The patient was given clear instructions to go to ER or return to medical center if symptoms don't improve, worsen or new problems develop. The patient verbalized understanding. The patient was told to call to get any lab results if not heard anything in the next week.

## 2013-09-22 ENCOUNTER — Telehealth: Payer: Self-pay | Admitting: *Deleted

## 2013-09-22 NOTE — Telephone Encounter (Signed)
Message copied by Lexii Walsh, [place] R on Mon Sep 22, 2013 10:26 AM ------      Message from: Allyson Sabal MD, [facility]      Created: Mon Sep 22, 2013  9:39 AM       Please patient the patient's hemoglobin A1c 7.0 ------

## 2013-09-29 DIAGNOSIS — M1710 Unilateral primary osteoarthritis, unspecified knee: Secondary | ICD-10-CM | POA: Diagnosis not present

## 2013-09-30 ENCOUNTER — Telehealth: Payer: Self-pay | Admitting: Internal Medicine

## 2013-09-30 MED ORDER — GLUCOSE BLOOD VI STRP: ORAL_STRIP | Status: DC

## 2013-09-30 NOTE — Telephone Encounter (Signed)
Patient called needing test strips for her glucometer Prescription called in to [facility] One touch ultra mini test strips with 2 refills

## 2013-09-30 NOTE — Telephone Encounter (Signed)
Pt was here on 09/17/13 and received script for lancets but none for test strips.  Pt also has question about type of test strips used with here meter.  Pt uses [facility], please f/u with pt.

## 2013-10-06 ENCOUNTER — Other Ambulatory Visit: Payer: Self-pay | Admitting: Internal Medicine

## 2013-10-06 DIAGNOSIS — Z1231 Encounter for screening mammogram for malignant neoplasm of breast: Secondary | ICD-10-CM

## 2013-10-07 ENCOUNTER — Ambulatory Visit (HOSPITAL_COMMUNITY)
Admission: RE | Admit: 2013-10-07 | Discharge: 2013-10-07 | Disposition: A | Discharge status: Home / Self Care | Payer: Medicare Other | Source: Ambulatory Visit | Attending: Internal Medicine | Admitting: Internal Medicine

## 2013-10-07 DIAGNOSIS — Z1231 Encounter for screening mammogram for malignant neoplasm of breast: Secondary | ICD-10-CM | POA: Diagnosis not present

## 2013-10-20 ENCOUNTER — Encounter: Payer: Self-pay | Admitting: Internal Medicine

## 2013-10-20 ENCOUNTER — Ambulatory Visit: Payer: Medicare Other | Attending: Internal Medicine | Admitting: Internal Medicine

## 2013-10-20 VITALS — Ht 62.0 in | Wt 258.8 lb

## 2013-10-20 DIAGNOSIS — L658 Other specified nonscarring hair loss: Secondary | ICD-10-CM | POA: Insufficient documentation

## 2013-10-20 DIAGNOSIS — L659 Nonscarring hair loss, unspecified: Secondary | ICD-10-CM

## 2013-10-20 DIAGNOSIS — E119 Type 2 diabetes mellitus without complications: Secondary | ICD-10-CM | POA: Insufficient documentation

## 2013-10-20 LAB — COMPLETE METABOLIC PANEL WITH GFR
ALT: 13 U/L (ref 0–35)
AST: 15 U/L (ref 0–37)
Albumin: 4.4 g/dL (ref 3.5–5.2)
Alkaline Phosphatase: 95 U/L (ref 39–117)
BUN: 15 mg/dL (ref 6–23)
CO2: 26 mEq/L (ref 19–32)
Calcium: 9.8 mg/dL (ref 8.4–10.5)
Chloride: 104 mEq/L (ref 96–112)
Creat: 1.06 mg/dL (ref 0.50–1.10)
GFR, Est African American: 66 mL/min
GFR, Est Non African American: 58 mL/min — ABNORMAL LOW
Glucose, Bld: 103 mg/dL — ABNORMAL HIGH (ref 70–99)
Potassium: 4 mEq/L (ref 3.5–5.3)
Sodium: 140 mEq/L (ref 135–145)
Total Bilirubin: 0.7 mg/dL (ref 0.2–1.2)
Total Protein: 7.9 g/dL (ref 6.0–8.3)

## 2013-10-20 LAB — CBC WITH DIFFERENTIAL/PLATELET
Basophils Absolute: 0 10*3/uL (ref 0.0–0.1)
Basophils Relative: 0 % (ref 0–1)
Eosinophils Absolute: 0.3 10*3/uL (ref 0.0–0.7)
Eosinophils Relative: 3 % (ref 0–5)
HCT: 38.9 % (ref 36.0–46.0)
Hemoglobin: 13.5 g/dL (ref 12.0–15.0)
Lymphocytes Relative: 24 % (ref 12–46)
Lymphs Abs: 2 10*3/uL (ref 0.7–4.0)
MCH: 30 pg (ref 26.0–34.0)
MCHC: 34.7 g/dL (ref 30.0–36.0)
MCV: 86.4 fL (ref 78.0–100.0)
Monocytes Absolute: 0.3 10*3/uL (ref 0.1–1.0)
Monocytes Relative: 4 % (ref 3–12)
Neutro Abs: 5.5 10*3/uL (ref 1.7–7.7)
Neutrophils Relative %: 69 % (ref 43–77)
Platelets: 283 10*3/uL (ref 150–400)
RBC: 4.5 MIL/uL (ref 3.87–5.11)
RDW: 13.7 % (ref 11.5–15.5)
WBC: 8.1 10*3/uL (ref 4.0–10.5)

## 2013-10-20 LAB — LIPID PANEL
Cholesterol: 158 mg/dL (ref 0–200)
HDL: 36 mg/dL — ABNORMAL LOW (ref >39)
LDL Cholesterol: 100 mg/dL — ABNORMAL HIGH (ref 0–99)
Total CHOL/HDL Ratio: 4.4 Ratio
Triglycerides: 110 mg/dL (ref <150)
VLDL: 22 mg/dL (ref 0–40)

## 2013-10-20 LAB — POCT GLYCOSYLATED HEMOGLOBIN (HGB A1C): Hemoglobin A1C: 7.3

## 2013-10-20 MED ORDER — LUBIPROSTONE 24 MCG PO CAPS: 24.0000 ug | ORAL_CAPSULE | Freq: Every day | ORAL | Status: DC

## 2013-10-20 MED ORDER — GLIPIZIDE 10 MG PO TABS: 10.0000 mg | ORAL_TABLET | Freq: Two times a day (BID) | ORAL | Status: DC

## 2013-10-20 MED ORDER — HYDROCHLOROTHIAZIDE 12.5 MG PO CAPS: 12.5000 mg | ORAL_CAPSULE | Freq: Every day | ORAL | Status: DC

## 2013-10-20 MED ORDER — MELOXICAM 15 MG PO TABS: 15.0000 mg | ORAL_TABLET | Freq: Every day | ORAL | Status: DC

## 2013-10-20 MED ORDER — FREESTYLE LANCETS MISC: Status: DC

## 2013-10-20 MED ORDER — LEVOTHYROXINE SODIUM 75 MCG PO TABS: 75.0000 ug | ORAL_TABLET | Freq: Every day | ORAL | Status: DC

## 2013-10-20 MED ORDER — LISINOPRIL 20 MG PO TABS: 20.0000 mg | ORAL_TABLET | Freq: Every day | ORAL | Status: DC

## 2013-10-20 NOTE — Progress Notes (Signed)
Pt is here for a f/u. No complaints today. Has one complaint of a painful mole on Lt arm.

## 2013-10-20 NOTE — Addendum Note (Signed)
Addended by: Allyson Sabal MD, [facility] on: 10/20/2013 02:32 PM   Modules accepted: Orders

## 2013-10-20 NOTE — Progress Notes (Addendum)
Patient ID: Kayla Bean, female   DOB: 12-15-1955, 58 y.o.   MRN: [id number]   CC:  HPI: 58 year old female with history of hypertension, diabetes, presents to the clinic for evaluation of folliculitis of her right breast, this has resolved with doxycycline. Patient currently not checking her CBG because she did not receive any lancets from her pharmacy She also complains of hair loss and is concerned about it. The patient has been on metformin since 2009. Metformin as noted above head loss. Patient also concerned about a mole on her left arm she noticed this for about 6 months. The patient states that the mole rubs against her clothes and is very uncomfortable   Allergies  Allergen Reactions  . Latex Hives  . Penicillins Hives  . Warfarin Sodium Hives    REACTION: Got rash when took one pill of coumadin  related to knee replacement surgery in 2007.  . Iodinated Diagnostic Agents Other (See Comments)    PT STATES SHE HAD AN ALLERGIC REACTION OF BLISTERS ON HANDS AND FEET 2 DAYS AFTER CT SCAN W/ IV CONTRAST INJECTION, DR. MANSELL SUGGESTS 13 HR PREP//A.C.   Past Medical History  Diagnosis Date  . Arthritis   . Asthma   . Diabetes mellitus   . Heart murmur   . Hypertension   . Thyroid disease     hypothyroidism  . Hemorrhoids    Current Outpatient Prescriptions on File Prior to Visit  Medication Sig Dispense Refill  . aspirin 81 MG tablet Take 81 mg by mouth daily.      . hydrochlorothiazide (MICROZIDE) 12.5 MG capsule Take 1 capsule (12.5 mg total) by mouth daily.  30 capsule  4  . levothyroxine (SYNTHROID, LEVOTHROID) 75 MCG tablet Take 1 tablet (75 mcg total) by mouth daily.  30 tablet  4  . lisinopril (PRINIVIL,ZESTRIL) 20 MG tablet Take 1 tablet (20 mg total) by mouth daily.  30 tablet  4  . lubiprostone (AMITIZA) 24 MCG capsule Take 1 capsule (24 mcg total) by mouth daily.  30 capsule  4  . doxycycline (VIBRA-TABS) 100 MG tablet Take 1 tablet (100 mg total) by mouth 2  (two) times daily.  20 tablet  0  . glucose blood (CHOICE DM FORA G20 TEST STRIPS) test strip Use as instructed  100 each  12  . meloxicam (MOBIC) 15 MG tablet       . neomycin-bacitracin-polymyxin (NEOSPORIN) ointment Apply 1 application topically every 12 (twelve) hours. apply to eye  15 g  3  . oxyCODONE-acetaminophen (PERCOCET/ROXICET) 5-325 MG per tablet       . travoprost, benzalkonium, (TRAVATAN) 0.004 % ophthalmic solution Place 1 drop into both eyes daily.        No current facility-administered medications on file prior to visit.   No family history on file. History   Social History  . Marital Status: Single    Spouse Name: N/A    Number of Children: N/A  . Years of Education: N/A   Occupational History  . Not on file.   Social History Main Topics  . Smoking status: Former Smoker    Quit date: 09/18/2001  . Smokeless tobacco: Not on file  . Alcohol Use: Yes  . Drug Use: No  . Sexual Activity:    Other Topics Concern  . Not on file   Social History Narrative  . No narrative on file    Review of Systems  Constitutional: Negative for fever, chills, diaphoresis, activity change, appetite change  and fatigue.  HENT: Negative for ear pain, nosebleeds, congestion, facial swelling, rhinorrhea, neck pain, neck stiffness and ear discharge.   Eyes: Negative for pain, discharge, redness, itching and visual disturbance.  Respiratory: Negative for cough, choking, chest tightness, shortness of breath, wheezing and stridor.   Cardiovascular: Negative for chest pain, palpitations and leg swelling.  Gastrointestinal: Negative for abdominal distention.  Genitourinary: Negative for dysuria, urgency, frequency, hematuria, flank pain, decreased urine volume, difficulty urinating and dyspareunia.  Musculoskeletal: Negative for back pain, joint swelling, arthralgias and gait problem.  Neurological: Negative for dizziness, tremors, seizures, syncope, facial asymmetry, speech difficulty,  weakness, light-headedness, numbness and headaches.  Hematological: Negative for adenopathy. Does not bruise/bleed easily.  Psychiatric/Behavioral: Negative for hallucinations, behavioral problems, confusion, dysphoric mood, decreased concentration and agitation.    Objective:  There were no vitals filed for this visit.  Physical Exam  Constitutional: Appears well-developed and well-nourished. No distress.  HENT: Normocephalic. External right and left ear normal. Oropharynx is clear and moist.  Eyes: Conjunctivae and EOM are normal. PERRLA, no scleral icterus.  Neck: Normal ROM. Neck supple. No JVD. No tracheal deviation. No thyromegaly.  CVS: RRR, S1/S2 +, no murmurs, no gallops, no carotid bruit.  Pulmonary: Effort and breath sounds normal, no stridor, rhonchi, wheezes, rales.  Abdominal: Soft. BS +,  no distension, tenderness, rebound or guarding.  Musculoskeletal: Normal range of motion. No edema and no tenderness.  Lymphadenopathy: No lymphadenopathy noted, cervical, inguinal. Neuro: Alert. Normal reflexes, muscle tone coordination. No cranial nerve deficit. Skin: Skin is warm and dry. No rash noted. Not diaphoretic. No erythema. No pallor.  Psychiatric: Normal mood and affect. Behavior, judgment, thought content normal.   Lab Results  Component Value Date   WBC 8.2 03/19/2013   HGB 13.3 03/19/2013   HCT 38.0 03/19/2013   MCV 85.6 03/19/2013   PLT 271 03/19/2013   Lab Results  Component Value Date   CREATININE 1.21* 03/19/2013   BUN 12 03/19/2013   NA 142 03/19/2013   K 4.3 03/19/2013   CL 107 03/19/2013   CO2 25 03/19/2013    Lab Results  Component Value Date   HGBA1C 7.0 09/17/2013   Lipid Panel     Component Value Date/Time   CHOL 166 09/25/2012 1254   TRIG 134 09/25/2012 1254   HDL 43 09/25/2012 1254   CHOLHDL 3.9 09/25/2012 1254   VLDL 27 09/25/2012 1254   LDLCALC 96 09/25/2012 1254       Assessment and plan:   Patient Active Problem List   Diagnosis Date Noted  . Abdominal pain,  chronic, right lower quadrant 05/23/2013  . Lymphadenopathy 05/23/2013  . Type 2 diabetes mellitus 03/19/2013  . Cholelithiasis 12/24/2012  . LIPOMA 07/26/2010  . SKIN TAG 11/19/2009  . SEBACEOUS CYST 11/19/2009  . CONSTIPATION, CHRONIC 01/18/2009  . EUSTACHIAN TUBE DYSFUNCTION, RIGHT 10/16/2008  . LOM 09/01/2008  . ESSENTIAL HYPERTENSION, BENIGN 08/18/2008  . HYPERTENSION 08/18/2008  . OSTEOARTHRITIS, KNEE, LEFT 04/23/2008  . PLANTAR FASCIITIS, RIGHT 04/23/2008  . HAIR LOSS 12/16/2007  . OTHER ABNORMAL BLOOD CHEMISTRY 12/16/2007  . ELEVATED BP READING WITHOUT DX HYPERTENSION 12/16/2007  . HYPOTHYROIDISM 10/15/2007  . DIABETES MELLITUS, TYPE II 09/04/2007  . ASTHMA 09/04/2007  . ARTHROSCOPY, RIGHT KNEE, HX OF 04/17/2006       Diabetes Discontinue metformin because of hair loss Switch to glipizide   Alopecia Check thyroid function, iron deficiency, hyperandrogenism to rule out androgenic baldness, free testosterone Dermatology referral for a scalp biopsy  Mole on  the left arm Refer to dermatology for removal  Follow up in 3 months  The patient was given clear instructions to go to ER or return to medical center if symptoms don't improve, worsen or new problems develop. The patient verbalized understanding. The patient was told to call to get any lab results if not heard anything in the next week.

## 2013-10-21 ENCOUNTER — Telehealth: Payer: Self-pay | Admitting: Internal Medicine

## 2013-10-21 ENCOUNTER — Telehealth: Payer: Self-pay | Admitting: Emergency Medicine

## 2013-10-21 LAB — TESTOSTERONE, FREE, TOTAL, SHBG
Sex Hormone Binding: 33 nmol/L (ref 18–114)
Testosterone, Free: 9.9 pg/mL — ABNORMAL HIGH (ref 0.6–6.8)
Testosterone-% Free: 1.8 % (ref 0.4–2.4)
Testosterone: 54.83 ng/dL (ref 10–70)

## 2013-10-21 LAB — T4, FREE: Free T4: 1.39 ng/dL (ref 0.80–1.80)

## 2013-10-21 LAB — ANEMIA PANEL
%SAT: 18 % — ABNORMAL LOW (ref 20–55)
ABS Retic: 54 10*3/uL (ref 19.0–186.0)
Ferritin: 211 ng/mL (ref 10–291)
Folate: 12.7 ng/mL
Iron: 67 ug/dL (ref 42–145)
RBC.: 4.5 MIL/uL (ref 3.87–5.11)
Retic Ct Pct: 1.2 % (ref 0.4–2.3)
TIBC: 373 ug/dL (ref 250–470)
UIBC: 306 ug/dL (ref 125–400)
Vitamin B-12: 482 pg/mL (ref 211–911)

## 2013-10-21 LAB — TSH: TSH: 2.326 u[IU]/mL (ref 0.350–4.500)

## 2013-10-21 NOTE — Telephone Encounter (Signed)
Pt says she was here yesterday and would like to speak to Dr Allyson Sabal or nurse.  Please f/u with pt.

## 2013-10-21 NOTE — Telephone Encounter (Signed)
Left message for pt to call clinic when message received 

## 2013-10-22 ENCOUNTER — Other Ambulatory Visit: Payer: Self-pay | Admitting: Dermatology

## 2013-10-22 DIAGNOSIS — L851 Acquired keratosis [keratoderma] palmaris et plantaris: Secondary | ICD-10-CM | POA: Diagnosis not present

## 2013-10-23 ENCOUNTER — Telehealth: Payer: Self-pay | Admitting: *Deleted

## 2013-10-23 NOTE — Telephone Encounter (Signed)
Left a voicemail for pt to give us a call back. 

## 2013-10-23 NOTE — Telephone Encounter (Signed)
Message copied by Teiana Hajduk, [place] R on Thu Oct 23, 2013  4:16 PM ------      Message from: Allyson Sabal MD, [facility]      Created: Tue Oct 21, 2013  2:07 PM       Patient's labs are negative, free testosterone mildly elevated but total testosterone within normal limits, patient needs to review these results with her dermatologist for loss of hair ------

## 2013-11-26 DIAGNOSIS — H11159 Pinguecula, unspecified eye: Secondary | ICD-10-CM | POA: Diagnosis not present

## 2013-11-26 DIAGNOSIS — H18419 Arcus senilis, unspecified eye: Secondary | ICD-10-CM | POA: Diagnosis not present

## 2013-11-26 DIAGNOSIS — H4011X0 Primary open-angle glaucoma, stage unspecified: Secondary | ICD-10-CM | POA: Diagnosis not present

## 2013-11-26 DIAGNOSIS — E119 Type 2 diabetes mellitus without complications: Secondary | ICD-10-CM | POA: Diagnosis not present

## 2013-12-18 ENCOUNTER — Ambulatory Visit: Payer: Medicare Other | Admitting: Internal Medicine

## 2014-01-28 DIAGNOSIS — M1710 Unilateral primary osteoarthritis, unspecified knee: Secondary | ICD-10-CM | POA: Diagnosis not present

## 2014-02-23 DIAGNOSIS — Z86010 Personal history of colonic polyps: Secondary | ICD-10-CM | POA: Diagnosis not present

## 2014-02-23 DIAGNOSIS — K5900 Constipation, unspecified: Secondary | ICD-10-CM | POA: Diagnosis not present

## 2014-02-23 DIAGNOSIS — I10 Essential (primary) hypertension: Secondary | ICD-10-CM | POA: Diagnosis not present

## 2014-02-26 DIAGNOSIS — D179 Benign lipomatous neoplasm, unspecified: Secondary | ICD-10-CM | POA: Diagnosis not present

## 2014-02-26 DIAGNOSIS — D126 Benign neoplasm of colon, unspecified: Secondary | ICD-10-CM | POA: Diagnosis not present

## 2014-02-26 DIAGNOSIS — Z86010 Personal history of colonic polyps: Secondary | ICD-10-CM | POA: Diagnosis not present

## 2014-02-26 DIAGNOSIS — K5730 Diverticulosis of large intestine without perforation or abscess without bleeding: Secondary | ICD-10-CM | POA: Diagnosis not present

## 2014-02-26 DIAGNOSIS — Z1211 Encounter for screening for malignant neoplasm of colon: Secondary | ICD-10-CM | POA: Diagnosis not present

## 2014-04-20 ENCOUNTER — Telehealth: Payer: Self-pay | Admitting: Emergency Medicine

## 2014-04-20 ENCOUNTER — Telehealth: Payer: Self-pay | Admitting: Internal Medicine

## 2014-04-20 ENCOUNTER — Other Ambulatory Visit: Payer: Self-pay | Admitting: Emergency Medicine

## 2014-04-20 MED ORDER — LISINOPRIL 20 MG PO TABS: 20.0000 mg | ORAL_TABLET | Freq: Every day | ORAL | Status: DC

## 2014-04-20 MED ORDER — HYDROCHLOROTHIAZIDE 12.5 MG PO CAPS
12.5000 mg | ORAL_CAPSULE | Freq: Every day | ORAL | Status: DC
Start: 2014-04-20 — End: 2014-04-24

## 2014-04-20 MED ORDER — GLIPIZIDE 10 MG PO TABS
10.0000 mg | ORAL_TABLET | Freq: Two times a day (BID) | ORAL | Status: DC
Start: 2014-04-20 — End: 2014-04-24

## 2014-04-20 MED ORDER — LEVOTHYROXINE SODIUM 75 MCG PO TABS: 75.0000 ug | ORAL_TABLET | Freq: Every day | ORAL | Status: DC

## 2014-04-20 MED ORDER — MELOXICAM 15 MG PO TABS
15.0000 mg | ORAL_TABLET | Freq: Every day | ORAL | Status: DC
Start: 2014-04-20 — End: 2014-11-05

## 2014-04-20 NOTE — Telephone Encounter (Signed)
Pt has appt scheduled on 04/24/14 but say she is out of refills and requesting enough to last until Friday. Please f/u with pt.

## 2014-04-20 NOTE — Telephone Encounter (Signed)
Pt called in requesting medication refills on Glipizide,Lisinopril,Levothyroxine and HCTZ until seen by provider 04/28/14 Informed pt we can give 30 day supply only until OV. Pt verbalized understanding Scripts e-scribed and sent to [facility]

## 2014-04-20 NOTE — Telephone Encounter (Signed)
Left message for pt to return call for medication refills until apt.

## 2014-04-24 ENCOUNTER — Ambulatory Visit: Payer: Medicare Other | Attending: Internal Medicine | Admitting: Internal Medicine

## 2014-04-24 ENCOUNTER — Encounter: Payer: Self-pay | Admitting: Internal Medicine

## 2014-04-24 VITALS — BP 134/81 | HR 60 | Temp 98.0°F | Resp 14 | Ht 62.0 in | Wt 263.0 lb

## 2014-04-24 DIAGNOSIS — E119 Type 2 diabetes mellitus without complications: Secondary | ICD-10-CM

## 2014-04-24 DIAGNOSIS — E039 Hypothyroidism, unspecified: Secondary | ICD-10-CM

## 2014-04-24 DIAGNOSIS — I10 Essential (primary) hypertension: Secondary | ICD-10-CM

## 2014-04-24 LAB — GLUCOSE, POCT (MANUAL RESULT ENTRY): POC Glucose: 74 mg/dl (ref 70–99)

## 2014-04-24 LAB — POCT GLYCOSYLATED HEMOGLOBIN (HGB A1C): Hemoglobin A1C: 7

## 2014-04-24 MED ORDER — LISINOPRIL 20 MG PO TABS: 20.0000 mg | ORAL_TABLET | Freq: Every day | ORAL | Status: DC

## 2014-04-24 MED ORDER — GLIPIZIDE 10 MG PO TABS: 10.0000 mg | ORAL_TABLET | Freq: Two times a day (BID) | ORAL | Status: DC

## 2014-04-24 MED ORDER — LUBIPROSTONE 24 MCG PO CAPS: 24.0000 ug | ORAL_CAPSULE | Freq: Every day | ORAL | Status: DC

## 2014-04-24 MED ORDER — HYDROCHLOROTHIAZIDE 12.5 MG PO CAPS: 12.5000 mg | ORAL_CAPSULE | Freq: Every day | ORAL | Status: DC

## 2014-04-24 NOTE — Progress Notes (Signed)
Patient is here for Diabetes, HTN and thyroid check.  She missed her last appointment.  She needs refills.

## 2014-04-24 NOTE — Progress Notes (Signed)
Patient ID: Kayla Bean, female   DOB: 10/29/1955, 58 y.o.   MRN: [id number]  CC: Followup  HPI:  Patient presents today for a follow up of HTN and DM.  She states that she was once on Metfomin but believes that it was taking out her hair.  She reports that she was taken off the medication and her hair began to grow back.  She does not have any c/o today.  Allergies  Allergen Reactions  . Latex Hives  . Penicillins Hives  . Warfarin Sodium Hives    REACTION: Got rash when took one pill of coumadin  related to knee replacement surgery in 2007.  . Iodinated Diagnostic Agents Other (See Comments)    PT STATES SHE HAD AN ALLERGIC REACTION OF BLISTERS ON HANDS AND FEET 2 DAYS AFTER CT SCAN W/ IV CONTRAST INJECTION, DR. MANSELL SUGGESTS 13 HR PREP//A.C.   Past Medical History  Diagnosis Date  . Arthritis   . Asthma   . Diabetes mellitus   . Heart murmur   . Hypertension   . Thyroid disease     hypothyroidism  . Hemorrhoids    Current Outpatient Prescriptions on File Prior to Visit  Medication Sig Dispense Refill  . aspirin 81 MG tablet Take 81 mg by mouth daily.      Marland Kitchen doxycycline (VIBRA-TABS) 100 MG tablet Take 1 tablet (100 mg total) by mouth 2 (two) times daily.  20 tablet  0  . glipiZIDE (GLUCOTROL) 10 MG tablet Take 1 tablet (10 mg total) by mouth 2 (two) times daily before a meal.  60 tablet  0  . glucose blood (CHOICE DM FORA G20 TEST STRIPS) test strip Use as instructed  100 each  12  . hydrochlorothiazide (MICROZIDE) 12.5 MG capsule Take 1 capsule (12.5 mg total) by mouth daily.  30 capsule  0  . Lancets (FREESTYLE) lancets Use as instructed  100 each  12  . levothyroxine (SYNTHROID, LEVOTHROID) 75 MCG tablet Take 1 tablet (75 mcg total) by mouth daily.  30 tablet  0  . lisinopril (PRINIVIL,ZESTRIL) 20 MG tablet Take 1 tablet (20 mg total) by mouth daily.  30 tablet  0  . lubiprostone (AMITIZA) 24 MCG capsule Take 1 capsule (24 mcg total) by mouth daily.  30 capsule  4  .  meloxicam (MOBIC) 15 MG tablet Take 1 tablet (15 mg total) by mouth daily.  30 tablet  0  . neomycin-bacitracin-polymyxin (NEOSPORIN) ointment Apply 1 application topically every 12 (twelve) hours. apply to eye  15 g  3  . oxyCODONE-acetaminophen (PERCOCET/ROXICET) 5-325 MG per tablet       . travoprost, benzalkonium, (TRAVATAN) 0.004 % ophthalmic solution Place 1 drop into both eyes daily.        No current facility-administered medications on file prior to visit.   No family history on file. History   Social History  . Marital Status: Single    Spouse Name: N/A    Number of Children: N/A  . Years of Education: N/A   Occupational History  . Not on file.   Social History Main Topics  . Smoking status: Former Smoker    Quit date: 09/18/2001  . Smokeless tobacco: Not on file  . Alcohol Use: Yes  . Drug Use: No  . Sexual Activity:    Other Topics Concern  . Not on file   Social History Narrative  . No narrative on file   Review of Systems  HENT: Negative.  Eyes: Negative.   Respiratory: Negative.   Cardiovascular: Negative.   Gastrointestinal: Negative.   Genitourinary: Negative for frequency.  Neurological: Positive for tingling.  Endo/Heme/Allergies: Negative for polydipsia.  Psychiatric/Behavioral: Negative.       Objective:   Filed Vitals:   04/24/14 1610  BP: 134/81  Pulse: 60  Temp: 98 F (36.7 C)  Resp: 14    Physical Exam: Constitutional: Patient appears well-developed and well-nourished. No distress. HENT: Normocephalic, atraumatic, External right and left ear normal. Oropharynx is clear and moist.  Eyes: Conjunctivae and EOM are normal. PERRLA, no scleral icterus. Neck: Normal ROM. Neck supple. No JVD. No tracheal deviation. No thyromegaly. CVS: RRR, S1/S2 +, no murmurs, no gallops, no carotid bruit.  Pulmonary: Effort and breath sounds normal, no stridor, rhonchi, wheezes, rales.  Abdominal: Soft. BS +,  no distension, tenderness, rebound or  guarding.  Musculoskeletal: Normal range of motion. No edema and no tenderness.  Lymphadenopathy: No lymphadenopathy noted, cervical, Neuro: Alert. Normal reflexes, muscle tone coordination. No cranial nerve deficit. Skin: Skin is warm and dry. No rash noted. Not diaphoretic. No erythema. No pallor. Psychiatric: Normal mood and affect. Behavior, judgment, thought content normal.  Lab Results  Component Value Date   WBC 8.1 10/20/2013   HGB 13.5 10/20/2013   HCT 38.9 10/20/2013   MCV 86.4 10/20/2013   PLT 283 10/20/2013   Lab Results  Component Value Date   CREATININE 1.06 10/20/2013   BUN 15 10/20/2013   NA 140 10/20/2013   K 4.0 10/20/2013   CL 104 10/20/2013   CO2 26 10/20/2013    Lab Results  Component Value Date   HGBA1C 7.0 04/24/2014   Lipid Panel     Component Value Date/Time   CHOL 158 10/20/2013 1421   TRIG 110 10/20/2013 1421   HDL 36* 10/20/2013 1421   CHOLHDL 4.4 10/20/2013 1421   VLDL 22 10/20/2013 1421   LDLCALC 100* 10/20/2013 1421       Assessment and plan:   Kayla Bean was seen today for diabetes, hypertension and follow-up.  Diagnoses and associated orders for this visit:  Type 2 diabetes mellitus without complication - Glucose (CBG) - HgB A1c - glipiZIDE (GLUCOTROL) 10 MG tablet; Take 1 tablet (10 mg total) by mouth 2 (two) times daily before a meal.  Unspecified hypothyroidism - TSH - T4, Free - lubiprostone (AMITIZA) 24 MCG capsule; Take 1 capsule (24 mcg total) by mouth daily.  Essential hypertension - hydrochlorothiazide (MICROZIDE) 12.5 MG capsule; Take 1 capsule (12.5 mg total) by mouth daily. - lisinopril (PRINIVIL,ZESTRIL) 20 MG tablet; Take 1 tablet (20 mg total) by mouth daily.   Return in about 3 months (around 07/25/2014) for DM/HTN.        Chari Manning, NP-C [facility] [phone] 05/26/2014, 1:06 PM

## 2014-04-25 LAB — TSH: TSH: 1.594 u[IU]/mL (ref 0.350–4.500)

## 2014-04-25 LAB — T4, FREE: Free T4: 1.26 ng/dL (ref 0.80–1.80)

## 2014-05-01 DIAGNOSIS — M1710 Unilateral primary osteoarthritis, unspecified knee: Secondary | ICD-10-CM | POA: Diagnosis not present

## 2014-05-13 ENCOUNTER — Other Ambulatory Visit: Payer: Self-pay | Admitting: *Deleted

## 2014-05-13 ENCOUNTER — Other Ambulatory Visit: Payer: Self-pay | Admitting: Internal Medicine

## 2014-05-13 ENCOUNTER — Telehealth: Payer: Self-pay | Admitting: Internal Medicine

## 2014-05-13 MED ORDER — LEVOTHYROXINE SODIUM 75 MCG PO TABS: 75.0000 ug | ORAL_TABLET | Freq: Every day | ORAL | Status: DC

## 2014-05-13 NOTE — Telephone Encounter (Signed)
Pt. Calling to get a refill on levothyroxine (SYNTHROID, LEVOTHROID) 75 MCG tablet and lubiprostone (AMITIZA) 24 MCG capsule. Please f/u with pt.

## 2014-05-13 NOTE — Telephone Encounter (Signed)
Pt left vm requesting med refill. Pls contact pt.

## 2014-05-18 ENCOUNTER — Telehealth: Payer: Self-pay | Admitting: Internal Medicine

## 2014-05-18 NOTE — Telephone Encounter (Signed)
Pt. Would like to know why she wasn't given a refill levothyroxine (SYNTHROID, LEVOTHROID) 75 MCG tablet [[id number]] Please call patient

## 2014-05-19 ENCOUNTER — Telehealth: Payer: Self-pay | Admitting: Internal Medicine

## 2014-05-19 ENCOUNTER — Telehealth: Payer: Self-pay | Admitting: *Deleted

## 2014-05-19 NOTE — Telephone Encounter (Signed)
After speaking with the pt she was concerned because her levothyroxin had no refills. I told her that is was ok and she should just call for another refill. Pt said that it was going to be a hassle to call every time she needed a refill. I told her I would note this and when she calls for a refill that a nurse could possibly order the rx with refills.

## 2014-05-19 NOTE — Telephone Encounter (Signed)
Patient called in today to see if she can get a call concerning her medication refill status; patient is taking levothyroxine (SYNTHROID, LEVOTHROID) 75 MCG tablet and has received no refills; please call patient to clarify this decision with her; patient was also given the nurse line to call after 2:00pm

## 2014-05-20 ENCOUNTER — Encounter: Payer: Self-pay | Admitting: Surgery

## 2014-06-10 DIAGNOSIS — Z23 Encounter for immunization: Secondary | ICD-10-CM | POA: Diagnosis not present

## 2014-06-12 ENCOUNTER — Other Ambulatory Visit: Payer: Self-pay | Admitting: Internal Medicine

## 2014-06-25 ENCOUNTER — Other Ambulatory Visit: Payer: Self-pay | Admitting: Internal Medicine

## 2014-06-25 MED ORDER — LEVOTHYROXINE SODIUM 75 MCG PO TABS: 75.0000 ug | ORAL_TABLET | Freq: Every day | ORAL | Status: DC

## 2014-07-14 NOTE — Telephone Encounter (Signed)
After speaking with the pt she was concerned because her levothyroxin had no refills. I told her that is was ok and she should just call for another refill. Pt said that it was going to be a hassle to call every time she needed a refill. I told her I would note this and when she calls for a refill that a nurse could possibly order the rx with refills.

## 2014-07-30 DIAGNOSIS — H11153 Pinguecula, bilateral: Secondary | ICD-10-CM | POA: Diagnosis not present

## 2014-07-30 DIAGNOSIS — H4011X2 Primary open-angle glaucoma, moderate stage: Secondary | ICD-10-CM | POA: Diagnosis not present

## 2014-07-30 DIAGNOSIS — Z961 Presence of intraocular lens: Secondary | ICD-10-CM | POA: Diagnosis not present

## 2014-07-30 DIAGNOSIS — E119 Type 2 diabetes mellitus without complications: Secondary | ICD-10-CM | POA: Diagnosis not present

## 2014-07-30 DIAGNOSIS — H18413 Arcus senilis, bilateral: Secondary | ICD-10-CM | POA: Diagnosis not present

## 2014-07-30 DIAGNOSIS — I10 Essential (primary) hypertension: Secondary | ICD-10-CM | POA: Diagnosis not present

## 2014-07-30 DIAGNOSIS — H35033 Hypertensive retinopathy, bilateral: Secondary | ICD-10-CM | POA: Diagnosis not present

## 2014-08-03 DIAGNOSIS — Z96651 Presence of right artificial knee joint: Secondary | ICD-10-CM | POA: Diagnosis not present

## 2014-08-03 DIAGNOSIS — M1712 Unilateral primary osteoarthritis, left knee: Secondary | ICD-10-CM | POA: Diagnosis not present

## 2014-09-07 ENCOUNTER — Encounter (HOSPITAL_COMMUNITY): Payer: Self-pay | Admitting: Emergency Medicine

## 2014-09-07 ENCOUNTER — Emergency Department (INDEPENDENT_AMBULATORY_CARE_PROVIDER_SITE_OTHER)
Admission: EM | Admit: 2014-09-07 | Discharge: 2014-09-07 | Disposition: A | Discharge status: Home / Self Care | Payer: Medicare Other | Source: Home / Self Care | Attending: Emergency Medicine | Admitting: Emergency Medicine

## 2014-09-07 DIAGNOSIS — J018 Other acute sinusitis: Secondary | ICD-10-CM | POA: Diagnosis not present

## 2014-09-07 LAB — POCT RAPID STREP A: Streptococcus, Group A Screen (Direct): NEGATIVE

## 2014-09-07 MED ORDER — IPRATROPIUM BROMIDE 0.06 % NA SOLN: 2.0000 | Freq: Four times a day (QID) | NASAL | Status: DC

## 2014-09-07 MED ORDER — DIPHENHYDRAMINE HCL 25 MG PO CAPS
ORAL_CAPSULE | ORAL | Status: AC
  Filled 2014-09-07: qty 1

## 2014-09-07 MED ORDER — MECLIZINE HCL 25 MG PO TABS: 25.0000 mg | ORAL_TABLET | Freq: Once | ORAL | Status: DC

## 2014-09-07 MED ORDER — DIPHENHYDRAMINE HCL 25 MG PO CAPS
25.0000 mg | ORAL_CAPSULE | Freq: Once | ORAL | Status: AC
  Administered 2014-09-07: 25 mg via ORAL

## 2014-09-07 MED ORDER — AZITHROMYCIN 250 MG PO TABS: ORAL_TABLET | ORAL | Status: DC

## 2014-09-07 NOTE — Discharge Instructions (Signed)
Take Azithromycin as directed. Use the atrovent nasal spray 4 times a day. Use nasal saline spray as often as you can.  Follow up as needed.

## 2014-09-07 NOTE — ED Provider Notes (Addendum)
CSN: [id number]     Arrival date & time 09/07/14  1761 History   First MD Initiated Contact with Patient 09/07/14 1029     Chief Complaint  Patient presents with  . Sore Throat  . Otalgia   (Consider location/radiation/quality/duration/timing/severity/associated sxs/prior Treatment) HPI  She is a 58-year-old woman here for evaluation of ear pain and sore throat. She states for the last 2 weeks her right ear has been "closing up." Then on Thursday, she developed right-sided throat pain and a bump on her right upper lip. She also reports a cough productive of clear sputum and bloody nasal discharge. She reports sinus pressure. No fevers or chills. No nausea or vomiting. No shortness of breath or chest pain.  Past Medical History  Diagnosis Date  . Arthritis   . Asthma   . Diabetes mellitus   . Heart murmur   . Hypertension   . Thyroid disease     hypothyroidism  . Hemorrhoids    Past Surgical History  Procedure Laterality Date  . Thyroidectomy    . Abdominal hysterectomy    . Total knee arthroplasty      right knee  . Knee surgery      left   History reviewed. No pertinent family history. History  Substance Use Topics  . Smoking status: Former Smoker    Quit date: 09/18/2001  . Smokeless tobacco: Not on file  . Alcohol Use: Yes   OB History    No data available     Review of Systems  Constitutional: Negative for fever and chills.  HENT: Positive for congestion, ear pain, rhinorrhea, sinus pressure and sore throat.   Respiratory: Positive for cough. Negative for shortness of breath.   Cardiovascular: Negative for chest pain.  Gastrointestinal: Negative for nausea and vomiting.    Allergies  Latex; Penicillins; Warfarin sodium; and Iodinated diagnostic agents  Home Medications   Prior to Admission medications   Medication Sig Start Date End Date Taking? Authorizing Provider  aspirin 81 MG tablet Take 81 mg by mouth daily.    Historical Provider, MD   azithromycin (ZITHROMAX Z-PAK) 250 MG tablet Take 2 pills on day 1, then 1 pill daily until gone 09/07/14   Melony Overly, MD  doxycycline (VIBRA-TABS) 100 MG tablet Take 1 tablet (100 mg total) by mouth 2 (two) times daily. 09/17/13   Reyne Dumas, MD  glipiZIDE (GLUCOTROL) 10 MG tablet Take 1 tablet (10 mg total) by mouth 2 (two) times daily before a meal. 04/24/14   Lance Bosch, NP  glucose blood (CHOICE DM FORA G20 TEST STRIPS) test strip Use as instructed 09/30/13   Tresa Garter, MD  hydrochlorothiazide (MICROZIDE) 12.5 MG capsule Take 1 capsule (12.5 mg total) by mouth daily. 04/24/14   Lance Bosch, NP  ipratropium (ATROVENT) 0.06 % nasal spray Place 2 sprays into both nostrils 4 (four) times daily. 09/07/14   Melony Overly, MD  Lancets (FREESTYLE) lancets Use as instructed 10/20/13   Reyne Dumas, MD  levothyroxine (SYNTHROID, LEVOTHROID) 75 MCG tablet Take 1 tablet (75 mcg total) by mouth daily. 06/25/14   Lance Bosch, NP  lisinopril (PRINIVIL,ZESTRIL) 20 MG tablet Take 1 tablet (20 mg total) by mouth daily. 04/24/14   Lance Bosch, NP  lubiprostone (AMITIZA) 24 MCG capsule Take 1 capsule (24 mcg total) by mouth daily. 04/24/14   Lance Bosch, NP  meloxicam (MOBIC) 15 MG tablet Take 1 tablet (15 mg total) by mouth daily. 04/20/14  Lance Bosch, NP  neomycin-bacitracin-polymyxin (NEOSPORIN) ointment Apply 1 application topically every 12 (twelve) hours. apply to eye 09/17/13   Reyne Dumas, MD  oxyCODONE-acetaminophen (PERCOCET/ROXICET) 5-325 MG per tablet  03/24/13   Historical Provider, MD  travoprost, benzalkonium, (TRAVATAN) 0.004 % ophthalmic solution Place 1 drop into both eyes daily.     Historical Provider, MD   BP 124/71 mmHg  Pulse 82  Temp(Src) 97.3 F (36.3 C)  Resp 16  SpO2 100% Physical Exam  Constitutional: She is oriented to person, place, and time. She appears well-developed and well-nourished. No distress.  HENT:  Head: Normocephalic and atraumatic.  Right  Ear: External ear normal. Tympanic membrane is erythematous.  Left Ear: External ear normal. Tympanic membrane is erythematous.  Nose: Mucosal edema and rhinorrhea present. Right sinus exhibits maxillary sinus tenderness. Right sinus exhibits no frontal sinus tenderness. Left sinus exhibits maxillary sinus tenderness. Left sinus exhibits no frontal sinus tenderness.  Mouth/Throat: Mucous membranes are not dry. Posterior oropharyngeal erythema present. No oropharyngeal exudate or tonsillar abscesses.  Neck: Neck supple.  Cardiovascular: Normal rate, regular rhythm and normal heart sounds.   No murmur heard. Pulmonary/Chest: Effort normal and breath sounds normal. No respiratory distress. She has no wheezes. She has no rales.  Lymphadenopathy:    She has no cervical adenopathy.  Neurological: She is alert and oriented to person, place, and time.    ED Course  Procedures (including critical care time) Labs Review Labs Reviewed  CULTURE, GROUP A STREP  POCT RAPID STREP A ([facility]RG CARE ONLY)    Imaging Review No results found.   MDM   1. Other acute sinusitis    We'll treat with Z-Pak. Also Atrovent nasal spray and nasal saline spray. Follow up as needed.  Patient reports vertigo symptoms after ear wax removal. Will give benadryl 25 mg by mouth.  She reports subjective improvement and feels well enough to go home.  Melony Overly, MD 09/07/14 [place], MD 09/07/14 [phone]

## 2014-09-07 NOTE — ED Notes (Signed)
Pt states that she has had right ear pain with sore throat since last Thursday 09/03/2014

## 2014-09-09 LAB — CULTURE, GROUP A STREP

## 2014-09-09 NOTE — ED Notes (Signed)
Final report of strep screening negative for A&B strep. No further action required

## 2014-09-14 ENCOUNTER — Ambulatory Visit: Payer: Medicare Other | Admitting: Internal Medicine

## 2014-10-23 ENCOUNTER — Ambulatory Visit (HOSPITAL_COMMUNITY)
Admission: RE | Admit: 2014-10-23 | Discharge: 2014-10-23 | Disposition: A | Discharge status: Home / Self Care | Payer: Medicare Other | Source: Ambulatory Visit | Attending: Internal Medicine | Admitting: Internal Medicine

## 2014-10-23 ENCOUNTER — Other Ambulatory Visit: Payer: Self-pay | Admitting: Internal Medicine

## 2014-10-23 ENCOUNTER — Telehealth: Payer: Self-pay | Admitting: Internal Medicine

## 2014-10-23 DIAGNOSIS — Z1231 Encounter for screening mammogram for malignant neoplasm of breast: Secondary | ICD-10-CM | POA: Insufficient documentation

## 2014-10-23 NOTE — Telephone Encounter (Signed)
Patient will run out of medication by Monday the 8th and will not be seen with her provider till the 15th. Please follow up with patients medication refill request:  lisinopril (PRINIVIL,ZESTRIL) 20 MG tablet levothyroxine (SYNTHROID, LEVOTHROID) 75 MCG tablet hydrochlorothiazide (MICROZIDE) 12.5 MG capsule lubiprostone (AMITIZA) 24 MCG capsule  Also has 1 refill left on: glipiZIDE (GLUCOTROL) 10 MG tablet

## 2014-10-26 LAB — HM DIABETES EYE EXAM

## 2014-10-27 ENCOUNTER — Telehealth: Payer: Self-pay | Admitting: *Deleted

## 2014-10-27 ENCOUNTER — Telehealth: Payer: Self-pay | Admitting: Emergency Medicine

## 2014-10-27 DIAGNOSIS — I10 Essential (primary) hypertension: Secondary | ICD-10-CM

## 2014-10-27 DIAGNOSIS — K649 Unspecified hemorrhoids: Secondary | ICD-10-CM

## 2014-10-27 MED ORDER — LEVOTHYROXINE SODIUM 75 MCG PO TABS: 75.0000 ug | ORAL_TABLET | Freq: Every day | ORAL | Status: DC

## 2014-10-27 MED ORDER — LISINOPRIL 20 MG PO TABS
20.0000 mg | ORAL_TABLET | Freq: Every day | ORAL | Status: DC
Start: 2014-10-27 — End: 2014-11-05

## 2014-10-27 MED ORDER — HYDROCHLOROTHIAZIDE 12.5 MG PO CAPS: 12.5000 mg | ORAL_CAPSULE | Freq: Every day | ORAL | Status: DC

## 2014-10-27 MED ORDER — LUBIPROSTONE 24 MCG PO CAPS: 24.0000 ug | ORAL_CAPSULE | Freq: Every day | ORAL | Status: DC

## 2014-10-27 NOTE — Telephone Encounter (Signed)
I Spoke with pt about Engineer, maintenance. She stated she has an upcoming appmnt. And wanted  Info at that time. A message was added to her apptment.

## 2014-10-27 NOTE — Telephone Encounter (Signed)
Pt aware of results Requested refills Rx Amitiza, Lisinopril,levothyroxin and HCTZ. Rx refills send, pt advised to maintain appointment with PCP for future refills

## 2014-10-27 NOTE — Telephone Encounter (Signed)
-----   Message from Lance Bosch, NP sent at 10/27/2014 12:22 AM EST ----- No evidence of malignancy on mammogram. Will repeat in one year

## 2014-11-02 ENCOUNTER — Ambulatory Visit: Payer: Medicare Other | Admitting: Internal Medicine

## 2014-11-04 DIAGNOSIS — M1712 Unilateral primary osteoarthritis, left knee: Secondary | ICD-10-CM | POA: Diagnosis not present

## 2014-11-05 ENCOUNTER — Ambulatory Visit: Payer: Medicare Other | Attending: Internal Medicine | Admitting: Internal Medicine

## 2014-11-05 ENCOUNTER — Encounter: Payer: Self-pay | Admitting: Internal Medicine

## 2014-11-05 VITALS — BP 133/84 | HR 68 | Temp 98.4°F | Resp 16 | Ht 62.0 in | Wt 268.0 lb

## 2014-11-05 DIAGNOSIS — E039 Hypothyroidism, unspecified: Secondary | ICD-10-CM | POA: Diagnosis not present

## 2014-11-05 DIAGNOSIS — H6091 Unspecified otitis externa, right ear: Secondary | ICD-10-CM

## 2014-11-05 DIAGNOSIS — Z6841 Body Mass Index (BMI) 40.0 and over, adult: Secondary | ICD-10-CM | POA: Diagnosis not present

## 2014-11-05 DIAGNOSIS — E119 Type 2 diabetes mellitus without complications: Secondary | ICD-10-CM | POA: Diagnosis not present

## 2014-11-05 DIAGNOSIS — Z7982 Long term (current) use of aspirin: Secondary | ICD-10-CM | POA: Diagnosis not present

## 2014-11-05 DIAGNOSIS — J45909 Unspecified asthma, uncomplicated: Secondary | ICD-10-CM | POA: Diagnosis not present

## 2014-11-05 DIAGNOSIS — I10 Essential (primary) hypertension: Secondary | ICD-10-CM

## 2014-11-05 DIAGNOSIS — Z87891 Personal history of nicotine dependence: Secondary | ICD-10-CM | POA: Diagnosis not present

## 2014-11-05 DIAGNOSIS — K649 Unspecified hemorrhoids: Secondary | ICD-10-CM | POA: Insufficient documentation

## 2014-11-05 DIAGNOSIS — R109 Unspecified abdominal pain: Secondary | ICD-10-CM | POA: Diagnosis not present

## 2014-11-05 DIAGNOSIS — E669 Obesity, unspecified: Secondary | ICD-10-CM | POA: Diagnosis not present

## 2014-11-05 LAB — POCT URINALYSIS DIPSTICK
Bilirubin, UA: NEGATIVE
Glucose, UA: NEGATIVE
Ketones, UA: NEGATIVE
Leukocytes, UA: NEGATIVE
Nitrite, UA: NEGATIVE
Protein, UA: NEGATIVE
Spec Grav, UA: 1.01
Urobilinogen, UA: 0.2
pH, UA: 6

## 2014-11-05 LAB — GLUCOSE, POCT (MANUAL RESULT ENTRY): POC Glucose: 151 mg/dl — AB (ref 70–99)

## 2014-11-05 LAB — POCT GLYCOSYLATED HEMOGLOBIN (HGB A1C): Hemoglobin A1C: 7.9

## 2014-11-05 MED ORDER — LISINOPRIL 20 MG PO TABS: 20.0000 mg | ORAL_TABLET | Freq: Every day | ORAL | Status: DC

## 2014-11-05 MED ORDER — HYDROCHLOROTHIAZIDE 12.5 MG PO CAPS: 12.5000 mg | ORAL_CAPSULE | Freq: Every day | ORAL | Status: DC

## 2014-11-05 MED ORDER — GLIPIZIDE 10 MG PO TABS: 10.0000 mg | ORAL_TABLET | Freq: Two times a day (BID) | ORAL | Status: DC

## 2014-11-05 MED ORDER — LUBIPROSTONE 24 MCG PO CAPS: 24.0000 ug | ORAL_CAPSULE | Freq: Every day | ORAL | Status: DC

## 2014-11-05 MED ORDER — NEOMYCIN-POLYMYXIN-HC 1 % OT SOLN: 3.0000 [drp] | Freq: Three times a day (TID) | OTIC | Status: DC

## 2014-11-05 NOTE — Progress Notes (Signed)
Patient ID: Kayla Bean, female   DOB: August 18, 1956, 58 y.o.   MRN: [id number]  CC: Follow up  HPI: Kayla Bean is a 59 y.o. female here today for a follow up visit.  Patient has past medical history of T2DM, HTN, asthma, and thyroid disease. Patient reports that she has been having a sensation of right ear fullness, no pain. She states that she had her ears flushed out 2 months ago due to cerumen impaction and is wondering if she needs irrigation again.  She c/o of a cramping, throbbing pain in her right flank area. She denies dysuria, fever, chills, nausea, vomiting, or hematuria.  She takes her daily medications as prescribed without skipped doses. She has not medication side effects.   Patient has No headache, No chest pain, No abdominal pain - No Nausea, No new weakness tingling or numbness, No Cough - SOB.  Allergies  Allergen Reactions  . Latex Hives  . Penicillins Hives  . Warfarin Sodium Hives    REACTION: Got rash when took one pill of coumadin  related to knee replacement surgery in 2007.  . Iodinated Diagnostic Agents Other (See Comments)    PT STATES SHE HAD AN ALLERGIC REACTION OF BLISTERS ON HANDS AND FEET 2 DAYS AFTER CT SCAN W/ IV CONTRAST INJECTION, DR. MANSELL SUGGESTS 13 HR PREP//A.C.   Past Medical History  Diagnosis Date  . Arthritis   . Asthma   . Diabetes mellitus   . Heart murmur   . Hypertension   . Thyroid disease     hypothyroidism  . Hemorrhoids    Current Outpatient Prescriptions on File Prior to Visit  Medication Sig Dispense Refill  . aspirin 81 MG tablet Take 81 mg by mouth daily.    Marland Kitchen glipiZIDE (GLUCOTROL) 10 MG tablet Take 1 tablet (10 mg total) by mouth 2 (two) times daily before a meal. 60 tablet 4  . glucose blood (CHOICE DM FORA G20 TEST STRIPS) test strip Use as instructed 100 each 12  . hydrochlorothiazide (MICROZIDE) 12.5 MG capsule Take 1 capsule (12.5 mg total) by mouth daily. 30 capsule 0  . Lancets (FREESTYLE) lancets Use as  instructed 100 each 12  . levothyroxine (SYNTHROID, LEVOTHROID) 75 MCG tablet Take 1 tablet (75 mcg total) by mouth daily. 30 tablet 0  . lisinopril (PRINIVIL,ZESTRIL) 20 MG tablet Take 1 tablet (20 mg total) by mouth daily. 30 tablet 0  . lubiprostone (AMITIZA) 24 MCG capsule Take 1 capsule (24 mcg total) by mouth daily. 30 capsule 0  . travoprost, benzalkonium, (TRAVATAN) 0.004 % ophthalmic solution Place 1 drop into both eyes daily.     Marland Kitchen azithromycin (ZITHROMAX Z-PAK) 250 MG tablet Take 2 pills on day 1, then 1 pill daily until gone (Patient not taking: Reported on 11/05/2014) 6 tablet 0  . doxycycline (VIBRA-TABS) 100 MG tablet Take 1 tablet (100 mg total) by mouth 2 (two) times daily. (Patient not taking: Reported on 11/05/2014) 20 tablet 0  . ipratropium (ATROVENT) 0.06 % nasal spray Place 2 sprays into both nostrils 4 (four) times daily. (Patient not taking: Reported on 11/05/2014) 15 mL 12  . meloxicam (MOBIC) 15 MG tablet Take 1 tablet (15 mg total) by mouth daily. (Patient not taking: Reported on 11/05/2014) 30 tablet 0  . neomycin-bacitracin-polymyxin (NEOSPORIN) ointment Apply 1 application topically every 12 (twelve) hours. apply to eye (Patient not taking: Reported on 11/05/2014) 15 g 3  . oxyCODONE-acetaminophen (PERCOCET/ROXICET) 5-325 MG per tablet      No  current facility-administered medications on file prior to visit.   History reviewed. No pertinent family history. History   Social History  . Marital Status: Single    Spouse Name: N/A  . Number of Children: N/A  . Years of Education: N/A   Occupational History  . Not on file.   Social History Main Topics  . Smoking status: Former Smoker    Quit date: 09/18/2001  . Smokeless tobacco: Not on file  . Alcohol Use: Yes  . Drug Use: No  . Sexual Activity: Not on file   Other Topics Concern  . Not on file   Social History Narrative    Review of Systems: Constitutional: Negative for fever, chills, diaphoresis,  activity change, appetite change and fatigue. HENT: Negative for ear pain, nosebleeds, congestion, facial swelling, rhinorrhea, neck pain, neck stiffness and ear discharge.  Eyes: Negative for pain, discharge, redness, itching and visual disturbance. Respiratory: Negative for cough, choking, chest tightness, shortness of breath, wheezing and stridor.  Cardiovascular: Negative for chest pain, palpitations and leg swelling. Gastrointestinal: Negative for abdominal distention. Genitourinary: Negative for dysuria, urgency, frequency, hematuria, flank pain, decreased urine volume, difficulty urinating and dyspareunia.  Musculoskeletal: Negative for back pain, joint swelling, arthralgias and gait problem. Neurological: Negative for dizziness, tremors, seizures, syncope, facial asymmetry, speech difficulty, weakness, light-headedness, numbness and headaches.  Hematological: Negative for adenopathy. Does not bruise/bleed easily. Psychiatric/Behavioral: Negative for hallucinations, behavioral problems, confusion, dysphoric mood, decreased concentration and agitation.    Objective:   Filed Vitals:   11/05/14 1720  BP: 133/84  Pulse: 68  Temp: 98.4 F (36.9 C)  Resp: 16    Physical Exam  Constitutional: She is oriented to person, place, and time.  HENT:  Left Ear: External ear normal.  Nose: Nose normal.  Mouth/Throat: Oropharynx is clear and moist.  Erythematous ear canal  Cardiovascular: Normal rate, regular rhythm and normal heart sounds.   Pulmonary/Chest: Effort normal.  Musculoskeletal: Normal range of motion.  Lymphadenopathy:    She has no cervical adenopathy.  Neurological: She is alert and oriented to person, place, and time.  Skin: Skin is warm and dry.     Lab Results  Component Value Date   WBC 8.1 10/20/2013   HGB 13.5 10/20/2013   HCT 38.9 10/20/2013   MCV 86.4 10/20/2013   PLT 283 10/20/2013   Lab Results  Component Value Date   CREATININE 1.06 10/20/2013    BUN 15 10/20/2013   NA 140 10/20/2013   K 4.0 10/20/2013   CL 104 10/20/2013   CO2 26 10/20/2013    Lab Results  Component Value Date   HGBA1C 7.90 11/05/2014   Lipid Panel     Component Value Date/Time   CHOL 158 10/20/2013 1421   TRIG 110 10/20/2013 1421   HDL 36* 10/20/2013 1421   CHOLHDL 4.4 10/20/2013 1421   VLDL 22 10/20/2013 1421   LDLCALC 100* 10/20/2013 1421       Assessment and plan:   Kayla Bean was seen today for follow-up.  Diagnoses and all orders for this visit:  Type 2 diabetes mellitus without complication Orders: -     Glucose (CBG) -     HgB A1c -     Microalbumin, urine -    Refill  glipiZIDE (GLUCOTROL) 10 MG tablet; Take 1 tablet (10 mg total) by mouth 2 (two) times daily before a meal. -     Lipid panel; Future -     TSH; Future -  COMPLETE METABOLIC PANEL WITH GFR; Future Patients diabetes is well control as evidence by consistently low a1c.  Patient will continue with current therapy and continue to make necessary lifestyle changes.  Reviewed foot care, diet, exercise, annual health maintenance with patient.   Essential hypertension Orders: -     lisinopril (PRINIVIL,ZESTRIL) 20 MG tablet; Take 1 tablet (20 mg total) by mouth daily. -     hydrochlorothiazide (MICROZIDE) 12.5 MG capsule; Take 1 capsule (12.5 mg total) by mouth daily. Patient blood pressure is stable and may continue on current medication.  Education on diet, exercise, and modifiable risk factors discussed. Will obtain appropriate labs as needed. Will follow up in 3-6 months.   Hemorrhoids, unspecified hemorrhoid type Orders: -     Refill lubiprostone (AMITIZA) 24 MCG capsule; Take 1 capsule (24 mcg total) by mouth daily.  Flank pain Orders: -     POCT urinalysis dipstick--negative  Obesity Orders: -     Ambulatory referral to General Surgery. Patient wants bariatric surgical consult.   Otitis externa, right Orders: -    Begin NEOMYCIN-POLYMYXIN-HYDROCORTISONE  (CORTISPORIN) 1 % SOLN otic solution; Place 3 drops into the right ear every 8 (eight) hours.  Return in about 3 months (around 02/03/2015) for DM/HTN.       Chari Manning, NP-C [facility] [phone] 11/05/2014, 6:04 PM

## 2014-11-05 NOTE — Patient Instructions (Signed)

## 2014-11-05 NOTE — Progress Notes (Signed)
Pt is here following up on her diabetes, HTN and her asthma. Pt reports having a like a pounding pain in her right flank. She said that sometimes the area gets really tight.

## 2014-11-06 LAB — MICROALBUMIN, URINE: Microalb, Ur: <0.2 mg/dL (ref <2.0)

## 2014-11-10 ENCOUNTER — Encounter: Payer: Self-pay | Admitting: Internal Medicine

## 2014-11-12 ENCOUNTER — Ambulatory Visit: Payer: Medicare Other | Attending: Internal Medicine

## 2014-11-12 ENCOUNTER — Other Ambulatory Visit: Payer: Self-pay | Admitting: Internal Medicine

## 2014-11-12 DIAGNOSIS — E119 Type 2 diabetes mellitus without complications: Secondary | ICD-10-CM | POA: Diagnosis not present

## 2014-11-12 LAB — COMPLETE METABOLIC PANEL WITH GFR
ALT: 11 U/L (ref 0–35)
AST: 11 U/L (ref 0–37)
Albumin: 3.7 g/dL (ref 3.5–5.2)
Alkaline Phosphatase: 101 U/L (ref 39–117)
BUN: 18 mg/dL (ref 6–23)
CO2: 27 mEq/L (ref 19–32)
Calcium: 8.9 mg/dL (ref 8.4–10.5)
Chloride: 102 mEq/L (ref 96–112)
Creat: 1.16 mg/dL — ABNORMAL HIGH (ref 0.50–1.10)
GFR, Est African American: 59 mL/min — ABNORMAL LOW
GFR, Est Non African American: 51 mL/min — ABNORMAL LOW
Glucose, Bld: 169 mg/dL — ABNORMAL HIGH (ref 70–99)
Potassium: 4.7 mEq/L (ref 3.5–5.3)
Sodium: 139 mEq/L (ref 135–145)
Total Bilirubin: 0.6 mg/dL (ref 0.2–1.2)
Total Protein: 7.3 g/dL (ref 6.0–8.3)

## 2014-11-12 LAB — LIPID PANEL
Cholesterol: 147 mg/dL (ref 0–200)
HDL: 35 mg/dL — ABNORMAL LOW (ref >=46)
LDL Cholesterol: 96 mg/dL (ref 0–99)
Total CHOL/HDL Ratio: 4.2 Ratio
Triglycerides: 81 mg/dL (ref <150)
VLDL: 16 mg/dL (ref 0–40)

## 2014-11-12 LAB — TSH: TSH: 3.846 u[IU]/mL (ref 0.350–4.500)

## 2014-11-16 ENCOUNTER — Telehealth: Payer: Self-pay | Admitting: *Deleted

## 2014-11-16 ENCOUNTER — Telehealth: Payer: Self-pay | Admitting: Internal Medicine

## 2014-11-16 MED ORDER — LEVOTHYROXINE SODIUM 75 MCG PO TABS: 75.0000 ug | ORAL_TABLET | Freq: Every day | ORAL | Status: DC

## 2014-11-16 NOTE — Telephone Encounter (Signed)
Pt called returning nurse's phone call. Please f/u with pt

## 2014-11-16 NOTE — Telephone Encounter (Signed)
LVM to return call.

## 2014-11-16 NOTE — Telephone Encounter (Signed)
Pt aware of lab resuls

## 2014-11-16 NOTE — Telephone Encounter (Signed)
Left voice message to return call 

## 2014-11-16 NOTE — Telephone Encounter (Signed)
-----   Message from Lance Bosch, NP sent at 11/15/2014  7:11 PM EST ----- Cholesterol is within normal limits, please refill patient Synthroid current dose. Thyroid is within normal limits

## 2014-12-01 ENCOUNTER — Telehealth: Payer: Self-pay | Admitting: Emergency Medicine

## 2014-12-01 NOTE — Telephone Encounter (Signed)
Patient called and needs Rx refill on:   HCTZ Amitiza Levothyroxine Lisinopril

## 2014-12-02 DIAGNOSIS — H18413 Arcus senilis, bilateral: Secondary | ICD-10-CM | POA: Diagnosis not present

## 2014-12-02 DIAGNOSIS — H11153 Pinguecula, bilateral: Secondary | ICD-10-CM | POA: Diagnosis not present

## 2014-12-02 DIAGNOSIS — H4011X2 Primary open-angle glaucoma, moderate stage: Secondary | ICD-10-CM | POA: Diagnosis not present

## 2014-12-02 DIAGNOSIS — H04123 Dry eye syndrome of bilateral lacrimal glands: Secondary | ICD-10-CM | POA: Diagnosis not present

## 2014-12-02 DIAGNOSIS — Z9849 Cataract extraction status, unspecified eye: Secondary | ICD-10-CM | POA: Diagnosis not present

## 2014-12-04 NOTE — Telephone Encounter (Signed)
Pt calling to follow up on refill request. Please f/u with pt.

## 2014-12-07 ENCOUNTER — Other Ambulatory Visit: Payer: Self-pay | Admitting: *Deleted

## 2014-12-07 DIAGNOSIS — K649 Unspecified hemorrhoids: Secondary | ICD-10-CM

## 2014-12-07 DIAGNOSIS — I10 Essential (primary) hypertension: Secondary | ICD-10-CM

## 2014-12-07 MED ORDER — LUBIPROSTONE 24 MCG PO CAPS: 24.0000 ug | ORAL_CAPSULE | Freq: Every day | ORAL | Status: DC

## 2014-12-07 MED ORDER — HYDROCHLOROTHIAZIDE 12.5 MG PO CAPS: 12.5000 mg | ORAL_CAPSULE | Freq: Every day | ORAL | Status: DC

## 2014-12-07 MED ORDER — LISINOPRIL 20 MG PO TABS: 20.0000 mg | ORAL_TABLET | Freq: Every day | ORAL | Status: DC

## 2014-12-07 MED ORDER — LEVOTHYROXINE SODIUM 75 MCG PO TABS: 75.0000 ug | ORAL_TABLET | Freq: Every day | ORAL | Status: DC

## 2014-12-07 NOTE — Telephone Encounter (Signed)
Patient is calling regarding medication refills, please call patient

## 2014-12-07 NOTE — Progress Notes (Signed)
Pt needed refills for her medications. I sent the meds to her pharmacy.

## 2015-01-18 ENCOUNTER — Other Ambulatory Visit: Payer: Self-pay | Admitting: *Deleted

## 2015-01-18 DIAGNOSIS — K649 Unspecified hemorrhoids: Secondary | ICD-10-CM

## 2015-01-18 MED ORDER — LUBIPROSTONE 24 MCG PO CAPS
24.0000 ug | ORAL_CAPSULE | Freq: Every day | ORAL | Status: DC
Start: 2015-01-18 — End: 2015-06-17

## 2015-02-10 DIAGNOSIS — M1712 Unilateral primary osteoarthritis, left knee: Secondary | ICD-10-CM | POA: Diagnosis not present

## 2015-05-05 ENCOUNTER — Telehealth: Payer: Self-pay | Admitting: Internal Medicine

## 2015-05-05 NOTE — Telephone Encounter (Signed)
Patient called requesting medication refill on levothyroxine (SYNTHROID, LEVOTHROID) 75 MCG tablet, lubiprostone (AMITIZA) 24 MCG capsule, lisinopril (PRINIVIL,ZESTRIL) 20 MG tablet,glipiZIDE (GLUCOTROL) 10 MG tablet and hydrochlorothiazide (MICROZIDE) 12.5 MG capsule Patient was reminded that she needs to f/u, patient states she is unable to come in until next month and wasn't a least 30 day supply. Please f/u

## 2015-05-13 NOTE — Telephone Encounter (Signed)
Patient called requesting medication refill on levothyroxine (SYNTHROID, LEVOTHROID) 75 MCG tablet, lubiprostone (AMITIZA) 24 MCG capsule, lisinopril (PRINIVIL,ZESTRIL) 20 MG tablet,glipiZIDE (GLUCOTROL) 10 MG tablet and hydrochlorothiazide (MICROZIDE) 12.5 MG capsule Patient was reminded that she needs to f/u, patient states she is unable to come in until next month and wasn't a least 30 day supply. Please f/u

## 2015-05-19 ENCOUNTER — Telehealth: Payer: Self-pay

## 2015-05-19 DIAGNOSIS — E119 Type 2 diabetes mellitus without complications: Secondary | ICD-10-CM

## 2015-05-19 DIAGNOSIS — I10 Essential (primary) hypertension: Secondary | ICD-10-CM

## 2015-05-19 DIAGNOSIS — E039 Hypothyroidism, unspecified: Secondary | ICD-10-CM

## 2015-05-19 MED ORDER — GLIPIZIDE 10 MG PO TABS: 10.0000 mg | ORAL_TABLET | Freq: Two times a day (BID) | ORAL | Status: DC

## 2015-05-19 MED ORDER — LEVOTHYROXINE SODIUM 75 MCG PO TABS: 75.0000 ug | ORAL_TABLET | Freq: Every day | ORAL | Status: DC

## 2015-05-19 MED ORDER — LISINOPRIL 20 MG PO TABS: 20.0000 mg | ORAL_TABLET | Freq: Every day | ORAL | Status: DC

## 2015-05-19 MED ORDER — HYDROCHLOROTHIAZIDE 12.5 MG PO CAPS: 12.5000 mg | ORAL_CAPSULE | Freq: Every day | ORAL | Status: DC

## 2015-05-19 NOTE — Telephone Encounter (Signed)
Nurse called patient, patient verified date of birth. Patient called nurses voicemail for 2 prescriptions. Patient needs refills on lisinopril, glipizide, hctz and levothyroxine.  Nurse explained to patient, medications will be filled for one month only. Patient needs to schedule appointment with provider. Patient voices understanding and scheduled appointment to be seen by provider with front office staff.

## 2015-05-27 ENCOUNTER — Ambulatory Visit: Payer: Medicare Other | Admitting: Internal Medicine

## 2015-06-09 ENCOUNTER — Telehealth: Payer: Self-pay

## 2015-06-09 NOTE — Telephone Encounter (Signed)
Called Pt concerning Rx refill of Amitiza and Levothyroxine. Pt didn't answer call with the home number left on the voice message, and I wasn't able to leave a message due to answering machine not picking up.

## 2015-06-14 DIAGNOSIS — M1712 Unilateral primary osteoarthritis, left knee: Secondary | ICD-10-CM | POA: Diagnosis not present

## 2015-06-17 ENCOUNTER — Ambulatory Visit: Payer: Medicare Other | Attending: Internal Medicine | Admitting: Internal Medicine

## 2015-06-17 ENCOUNTER — Encounter: Payer: Self-pay | Admitting: Internal Medicine

## 2015-06-17 VITALS — BP 118/70 | HR 56 | Temp 98.0°F | Resp 16 | Ht 62.0 in | Wt 266.4 lb

## 2015-06-17 DIAGNOSIS — E039 Hypothyroidism, unspecified: Secondary | ICD-10-CM | POA: Diagnosis not present

## 2015-06-17 DIAGNOSIS — I10 Essential (primary) hypertension: Secondary | ICD-10-CM | POA: Diagnosis not present

## 2015-06-17 DIAGNOSIS — E119 Type 2 diabetes mellitus without complications: Secondary | ICD-10-CM

## 2015-06-17 DIAGNOSIS — Z0000 Encounter for general adult medical examination without abnormal findings: Secondary | ICD-10-CM | POA: Diagnosis not present

## 2015-06-17 DIAGNOSIS — K5900 Constipation, unspecified: Secondary | ICD-10-CM

## 2015-06-17 DIAGNOSIS — K5909 Other constipation: Secondary | ICD-10-CM

## 2015-06-17 DIAGNOSIS — R109 Unspecified abdominal pain: Secondary | ICD-10-CM | POA: Insufficient documentation

## 2015-06-17 LAB — POCT GLYCOSYLATED HEMOGLOBIN (HGB A1C): Hemoglobin A1C: 7.7

## 2015-06-17 LAB — GLUCOSE, POCT (MANUAL RESULT ENTRY): POC Glucose: 154 mg/dl — AB (ref 70–99)

## 2015-06-17 MED ORDER — LISINOPRIL 20 MG PO TABS: 20.0000 mg | ORAL_TABLET | Freq: Every day | ORAL | Status: DC

## 2015-06-17 MED ORDER — HYDROCHLOROTHIAZIDE 12.5 MG PO CAPS: 12.5000 mg | ORAL_CAPSULE | Freq: Every day | ORAL | Status: DC

## 2015-06-17 MED ORDER — GLIPIZIDE 10 MG PO TABS: 10.0000 mg | ORAL_TABLET | Freq: Two times a day (BID) | ORAL | Status: DC

## 2015-06-17 MED ORDER — LUBIPROSTONE 24 MCG PO CAPS: 24.0000 ug | ORAL_CAPSULE | Freq: Every day | ORAL | Status: DC

## 2015-06-17 NOTE — Telephone Encounter (Signed)
I refilled her amitiza but I explained that I will wait until her TSH level returns before I refill that in case her dose needs to be changed

## 2015-06-17 NOTE — Progress Notes (Signed)
Patient here for her six month follow up on her diabetes Patient complains of having some lower abd cramps for a few months And also some cramping in her neck

## 2015-06-17 NOTE — Progress Notes (Signed)
Patient ID: Kayla Bean, female   DOB: 17-Apr-1956, 59 y.o.   MRN: [id number] SUBJECTIVE: 59 y.o. female for follow up of diabetes. Diabetic Review of Systems - medication compliance: compliant all of the time, diabetic diet compliance: noncompliant some of the time, home glucose monitoring: is performed sporadically, further diabetic ROS: no polyuria or polydipsia, no chest pain, dyspnea or TIA's, no numbness, tingling or pain in extremities, no unusual visual symptoms, no hypoglycemia.  Other symptoms and concerns: abdominal cramping and neck cramps for the past 5 months. She notes that she has the cramps after taking her medication but she is unsure which medication.   Current Outpatient Prescriptions  Medication Sig Dispense Refill  . aspirin 81 MG tablet Take 81 mg by mouth daily.    Marland Kitchen glipiZIDE (GLUCOTROL) 10 MG tablet Take 1 tablet (10 mg total) by mouth 2 (two) times daily before a meal. 60 tablet 0  . hydrochlorothiazide (MICROZIDE) 12.5 MG capsule Take 1 capsule (12.5 mg total) by mouth daily. 30 capsule 0  . levothyroxine (SYNTHROID, LEVOTHROID) 75 MCG tablet Take 1 tablet (75 mcg total) by mouth daily. 30 tablet 0  . lisinopril (PRINIVIL,ZESTRIL) 20 MG tablet Take 1 tablet (20 mg total) by mouth daily. 30 tablet 0  . lubiprostone (AMITIZA) 24 MCG capsule Take 1 capsule (24 mcg total) by mouth daily. 30 capsule 2  . glucose blood (CHOICE DM FORA G20 TEST STRIPS) test strip Use as instructed 100 each 12  . ipratropium (ATROVENT) 0.06 % nasal spray Place 2 sprays into both nostrils 4 (four) times daily. (Patient not taking: Reported on 11/05/2014) 15 mL 12  . Lancets (FREESTYLE) lancets Use as instructed 100 each 12  . NEOMYCIN-POLYMYXIN-HYDROCORTISONE (CORTISPORIN) 1 % SOLN otic solution Place 3 drops into the right ear every 8 (eight) hours. 10 mL 0  . oxyCODONE-acetaminophen (PERCOCET/ROXICET) 5-325 MG per tablet     . travoprost, benzalkonium, (TRAVATAN) 0.004 % ophthalmic solution  Place 1 drop into both eyes daily.      No current facility-administered medications for this visit.    OBJECTIVE: Appearance: alert, well appearing, and in no distress, oriented to person, place, and time and overweight. BP 118/70 mmHg  Pulse 56  Temp(Src) 98 F (36.7 C)  Resp 16  Ht 5\' 2"  (1.575 m)  Wt 266 lb 6.4 oz (120.838 kg)  BMI 48.71 kg/m2  SpO2 100%  Exam: heart sounds normal rate, regular rhythm, normal S1, S2, no murmurs, rubs, clicks or gallops, no JVD, chest clear, no hepatosplenomegaly, no carotid bruits, feet: warm, good capillary refill, no trophic changes or ulcerative lesions, normal DP and PT pulses, normal monofilament exam and normal sensory exam  Kayla Bean was seen today for follow-up.  Diagnoses and all orders for this visit:  Type 2 diabetes mellitus without complication -     HgB C3F -     Glucose (CBG) -     glipiZIDE (GLUCOTROL) 10 MG tablet; Take 1 tablet (10 mg total) by mouth 2 (two) times daily before a meal. Patients diabetes is well control as evidence by consistently low a1c.  Patient will continue with current therapy and continue to make necessary lifestyle changes.  Reviewed foot care, diet, exercise, annual health maintenance with patient.   Essential hypertension -     hydrochlorothiazide (MICROZIDE) 12.5 MG capsule; Take 1 capsule (12.5 mg total) by mouth daily. -     lisinopril (PRINIVIL,ZESTRIL) 20 MG tablet; Take 1 tablet (20 mg total) by mouth daily. -  COMPLETE METABOLIC PANEL WITH GFR Patient blood pressure is stable and may continue on current medication.  Education on diet, exercise, and modifiable risk factors discussed. Will obtain appropriate labs as needed. Will follow up in 3-6 months.   Hypothyroidism, unspecified hypothyroidism type -     TSH -     T4, Free Will refill meds once levels return. Stable  Chronic constipation -     lubiprostone (AMITIZA) 24 MCG capsule; Take 1 capsule (24 mcg total) by mouth daily. Stable,  meds refilled  Abdominal cramps -     Magnesium Will check potassium level. Patient may take medications 2-3 hours apart to see if she can figure out which medication is causing her cramps  Health care maintenance -     Flu Vaccine QUAD 36+ mos PF IM (Fluarix & Fluzone Quad PF)   Return in about 6 months (around 12/15/2015) for DM/HTN.   Lance Bosch, NP 06/18/2015 4:32 PM

## 2015-06-18 ENCOUNTER — Telehealth: Payer: Self-pay

## 2015-06-18 LAB — COMPLETE METABOLIC PANEL WITH GFR
ALT: 15 U/L (ref 6–29)
AST: 14 U/L (ref 10–35)
Albumin: 3.8 g/dL (ref 3.6–5.1)
Alkaline Phosphatase: 86 U/L (ref 33–130)
BUN: 21 mg/dL (ref 7–25)
CO2: 27 mmol/L (ref 20–31)
Calcium: 8.9 mg/dL (ref 8.6–10.4)
Chloride: 104 mmol/L (ref 98–110)
Creat: 1.2 mg/dL — ABNORMAL HIGH (ref 0.50–0.99)
GFR, Est African American: 56 mL/min — ABNORMAL LOW (ref >=60)
GFR, Est Non African American: 49 mL/min — ABNORMAL LOW (ref >=60)
Glucose, Bld: 112 mg/dL — ABNORMAL HIGH (ref 65–99)
Potassium: 3.9 mmol/L (ref 3.5–5.3)
Sodium: 139 mmol/L (ref 135–146)
Total Bilirubin: 0.4 mg/dL (ref 0.2–1.2)
Total Protein: 7.4 g/dL (ref 6.1–8.1)

## 2015-06-18 LAB — MAGNESIUM: Magnesium: 2.1 mg/dL (ref 1.5–2.5)

## 2015-06-18 LAB — T4, FREE: Free T4: 0.99 ng/dL (ref 0.80–1.80)

## 2015-06-18 LAB — TSH: TSH: 3.759 u[IU]/mL (ref 0.350–4.500)

## 2015-06-18 NOTE — Telephone Encounter (Signed)
CMA called Pt, Pt verified DOB. Patient was notified that Provider will not refill her Levothyroxine until Pt's labs are in. Pt was told that her meds for Amitiza is ready at the [facility] for pick-up. Pt verbalized she understand the message delivered and awaits her lab results.

## 2015-06-30 ENCOUNTER — Telehealth: Payer: Self-pay

## 2015-06-30 NOTE — Telephone Encounter (Signed)
Spoke with patient this am and she is aware of her lab results Went over with patient which medications she should avoid and she  Verbalized her understanding

## 2015-06-30 NOTE — Telephone Encounter (Signed)
-----   Message from Lance Bosch, NP sent at 06/29/2015  6:23 PM EDT ----- Kidneys levels are still elevated, which may be a result of longstanding diabetes and HTN. No NSAID's--please go over what they are.

## 2015-08-02 ENCOUNTER — Telehealth: Payer: Self-pay | Admitting: Internal Medicine

## 2015-08-02 NOTE — Telephone Encounter (Signed)
Patient has a cyst on her vagina and is very painful. She cant walk, sit and not even wear underwear and is the second one in two weeks. Please follow up with patient for advise. Patient is interested in being sent in a Rx. I advised patient that she most likely will need to be seen so I have scheduled an appt.

## 2015-08-03 ENCOUNTER — Telehealth: Payer: Self-pay

## 2015-08-03 DIAGNOSIS — I10 Essential (primary) hypertension: Secondary | ICD-10-CM

## 2015-08-03 MED ORDER — LEVOTHYROXINE SODIUM 75 MCG PO TABS: 75.0000 ug | ORAL_TABLET | Freq: Every day | ORAL | Status: DC

## 2015-08-03 MED ORDER — LISINOPRIL 20 MG PO TABS: 20.0000 mg | ORAL_TABLET | Freq: Every day | ORAL | Status: DC

## 2015-08-03 NOTE — Telephone Encounter (Signed)
Not without seeing it. She may try going to the urgent care

## 2015-08-03 NOTE — Telephone Encounter (Signed)
Returned call to patient Patient states she has boil on her vagina and it is very painful Patient is requesting a prescription to help get rid of it  Patient does not have an appt with you until 11/30 Is there something we can send to pharmacy

## 2015-08-03 NOTE — Telephone Encounter (Signed)
Spoke with patient and she is aware she can be seen at the urgent care For her boil

## 2015-08-18 ENCOUNTER — Ambulatory Visit: Payer: Medicare Other | Attending: Internal Medicine | Admitting: Internal Medicine

## 2015-08-18 ENCOUNTER — Encounter: Payer: Self-pay | Admitting: Internal Medicine

## 2015-08-18 VITALS — BP 130/85 | HR 97 | Temp 98.7°F | Resp 16 | Ht 62.0 in | Wt 260.0 lb

## 2015-08-18 DIAGNOSIS — E079 Disorder of thyroid, unspecified: Secondary | ICD-10-CM

## 2015-08-18 DIAGNOSIS — I10 Essential (primary) hypertension: Secondary | ICD-10-CM | POA: Diagnosis not present

## 2015-08-18 DIAGNOSIS — E119 Type 2 diabetes mellitus without complications: Secondary | ICD-10-CM

## 2015-08-18 DIAGNOSIS — J302 Other seasonal allergic rhinitis: Secondary | ICD-10-CM | POA: Diagnosis not present

## 2015-08-18 DIAGNOSIS — R8290 Unspecified abnormal findings in urine: Secondary | ICD-10-CM

## 2015-08-18 DIAGNOSIS — Z7982 Long term (current) use of aspirin: Secondary | ICD-10-CM | POA: Insufficient documentation

## 2015-08-18 DIAGNOSIS — L0291 Cutaneous abscess, unspecified: Secondary | ICD-10-CM

## 2015-08-18 DIAGNOSIS — Z87891 Personal history of nicotine dependence: Secondary | ICD-10-CM | POA: Insufficient documentation

## 2015-08-18 LAB — POCT URINALYSIS DIPSTICK
Glucose, UA: NEGATIVE
Ketones, UA: NEGATIVE
Leukocytes, UA: NEGATIVE
Nitrite, UA: NEGATIVE
Protein, UA: 30
Spec Grav, UA: >=1.03
Urobilinogen, UA: 1
pH, UA: 5.5

## 2015-08-18 LAB — GLUCOSE, POCT (MANUAL RESULT ENTRY): POC Glucose: 184 mg/dl — AB (ref 70–99)

## 2015-08-18 MED ORDER — LISINOPRIL 20 MG PO TABS: 20.0000 mg | ORAL_TABLET | Freq: Every day | ORAL | Status: DC

## 2015-08-18 MED ORDER — LORATADINE 10 MG PO TABS: 10.0000 mg | ORAL_TABLET | Freq: Every day | ORAL | Status: DC

## 2015-08-18 MED ORDER — SULFAMETHOXAZOLE-TRIMETHOPRIM 800-160 MG PO TABS: 1.0000 | ORAL_TABLET | Freq: Two times a day (BID) | ORAL | Status: DC

## 2015-08-18 MED ORDER — ALBUTEROL SULFATE HFA 108 (90 BASE) MCG/ACT IN AERS
2.0000 | INHALATION_SPRAY | Freq: Four times a day (QID) | RESPIRATORY_TRACT | Status: DC | PRN
Start: 2015-08-18 — End: 2015-08-24

## 2015-08-18 MED ORDER — LEVOTHYROXINE SODIUM 75 MCG PO TABS: 75.0000 ug | ORAL_TABLET | Freq: Every day | ORAL | Status: DC

## 2015-08-18 NOTE — Progress Notes (Signed)
C/C cyst on vaginal area,. Chest congestion x3  days  Stated urinating with odor x 3 weeks No pain today  No suicide thought in the past two weeks  No tobacco user
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
## 2015-08-20 NOTE — Telephone Encounter (Signed)
Pt. Called stating that two of her medications that were prescribed from her PCP are not covered by her insurance. Please f/u with pt.

## 2015-08-23 ENCOUNTER — Telehealth: Payer: Self-pay

## 2015-08-23 NOTE — Telephone Encounter (Signed)
Returned patient phone call Patient states her albuterol is not covered by her insurance Is there something else we can provide for her Patient cell # [phone]

## 2015-08-24 ENCOUNTER — Telehealth: Payer: Self-pay

## 2015-08-24 ENCOUNTER — Other Ambulatory Visit: Payer: Self-pay | Admitting: Internal Medicine

## 2015-08-24 MED ORDER — ALBUTEROL SULFATE 108 (90 BASE) MCG/ACT IN AEPB: 2.0000 | INHALATION_SPRAY | Freq: Four times a day (QID) | RESPIRATORY_TRACT | Status: DC | PRN

## 2015-08-24 NOTE — Telephone Encounter (Signed)
Spoke with patient this am and she is aware that a new RX  For an inhaler has been sent to the [employer] on file

## 2015-08-24 NOTE — Telephone Encounter (Signed)
i have switched med to another inhaler that should be covered

## 2015-10-01 ENCOUNTER — Other Ambulatory Visit: Payer: Self-pay | Admitting: Internal Medicine

## 2015-10-01 ENCOUNTER — Other Ambulatory Visit: Payer: Self-pay

## 2015-10-01 DIAGNOSIS — Z1231 Encounter for screening mammogram for malignant neoplasm of breast: Secondary | ICD-10-CM

## 2015-10-01 NOTE — Telephone Encounter (Signed)
Patient called requesting medication refill...Marland KitchenMarland KitchenMarland Kitchenlubiprostone (AMITIZA) 24 MCG capsule [id number]...please send to [facility].

## 2015-10-04 ENCOUNTER — Other Ambulatory Visit: Payer: Self-pay | Admitting: Orthopaedic Surgery

## 2015-10-04 DIAGNOSIS — M79605 Pain in left leg: Secondary | ICD-10-CM

## 2015-10-04 DIAGNOSIS — M1711 Unilateral primary osteoarthritis, right knee: Secondary | ICD-10-CM | POA: Diagnosis not present

## 2015-10-04 DIAGNOSIS — M1712 Unilateral primary osteoarthritis, left knee: Secondary | ICD-10-CM | POA: Diagnosis not present

## 2015-10-08 ENCOUNTER — Ambulatory Visit
Admission: RE | Admit: 2015-10-08 | Discharge: 2015-10-08 | Disposition: A | Discharge status: Home / Self Care | Payer: Medicare Other | Source: Ambulatory Visit | Attending: Orthopaedic Surgery | Admitting: Orthopaedic Surgery

## 2015-10-08 DIAGNOSIS — M79605 Pain in left leg: Secondary | ICD-10-CM | POA: Diagnosis not present

## 2015-10-26 ENCOUNTER — Ambulatory Visit
Admission: RE | Admit: 2015-10-26 | Discharge: 2015-10-26 | Disposition: A | Discharge status: Home / Self Care | Payer: Medicare Other | Source: Ambulatory Visit

## 2015-10-26 DIAGNOSIS — Z1231 Encounter for screening mammogram for malignant neoplasm of breast: Secondary | ICD-10-CM | POA: Diagnosis not present

## 2015-11-01 ENCOUNTER — Telehealth: Payer: Self-pay

## 2015-11-01 DIAGNOSIS — M1711 Unilateral primary osteoarthritis, right knee: Secondary | ICD-10-CM | POA: Diagnosis not present

## 2015-11-01 NOTE — Telephone Encounter (Signed)
Patient is aware of her normal mammogram

## 2015-11-01 NOTE — Telephone Encounter (Signed)
-----   Message from Lance Bosch, NP sent at 11/01/2015  9:04 AM EST ----- Normal mammogram

## 2015-11-15 DIAGNOSIS — M1711 Unilateral primary osteoarthritis, right knee: Secondary | ICD-10-CM | POA: Diagnosis not present

## 2015-11-22 DIAGNOSIS — M1711 Unilateral primary osteoarthritis, right knee: Secondary | ICD-10-CM | POA: Diagnosis not present

## 2015-11-29 DIAGNOSIS — M1711 Unilateral primary osteoarthritis, right knee: Secondary | ICD-10-CM | POA: Diagnosis not present

## 2015-12-06 ENCOUNTER — Other Ambulatory Visit: Payer: Self-pay | Admitting: Internal Medicine

## 2015-12-06 DIAGNOSIS — M1711 Unilateral primary osteoarthritis, right knee: Secondary | ICD-10-CM | POA: Diagnosis not present

## 2015-12-15 ENCOUNTER — Other Ambulatory Visit: Payer: Self-pay | Admitting: Internal Medicine

## 2016-01-10 DIAGNOSIS — M1712 Unilateral primary osteoarthritis, left knee: Secondary | ICD-10-CM | POA: Diagnosis not present

## 2016-01-10 DIAGNOSIS — M1711 Unilateral primary osteoarthritis, right knee: Secondary | ICD-10-CM | POA: Diagnosis not present

## 2016-01-19 ENCOUNTER — Other Ambulatory Visit: Payer: Self-pay | Admitting: Internal Medicine

## 2016-01-27 ENCOUNTER — Emergency Department (HOSPITAL_COMMUNITY)
Admission: EM | Admit: 2016-01-27 | Discharge: 2016-01-27 | Disposition: A | Discharge status: Home / Self Care | Payer: Medicare Other | Attending: Emergency Medicine | Admitting: Emergency Medicine

## 2016-01-27 ENCOUNTER — Encounter (HOSPITAL_COMMUNITY): Payer: Self-pay | Admitting: *Deleted

## 2016-01-27 ENCOUNTER — Emergency Department (HOSPITAL_COMMUNITY): Payer: Medicare Other

## 2016-01-27 DIAGNOSIS — R011 Cardiac murmur, unspecified: Secondary | ICD-10-CM | POA: Insufficient documentation

## 2016-01-27 DIAGNOSIS — T07XXXA Unspecified multiple injuries, initial encounter: Secondary | ICD-10-CM

## 2016-01-27 DIAGNOSIS — Z792 Long term (current) use of antibiotics: Secondary | ICD-10-CM | POA: Insufficient documentation

## 2016-01-27 DIAGNOSIS — R079 Chest pain, unspecified: Secondary | ICD-10-CM | POA: Diagnosis not present

## 2016-01-27 DIAGNOSIS — Y9389 Activity, other specified: Secondary | ICD-10-CM | POA: Insufficient documentation

## 2016-01-27 DIAGNOSIS — J45909 Unspecified asthma, uncomplicated: Secondary | ICD-10-CM | POA: Diagnosis not present

## 2016-01-27 DIAGNOSIS — Z880 Allergy status to penicillin: Secondary | ICD-10-CM | POA: Insufficient documentation

## 2016-01-27 DIAGNOSIS — Z87891 Personal history of nicotine dependence: Secondary | ICD-10-CM | POA: Insufficient documentation

## 2016-01-27 DIAGNOSIS — Z7982 Long term (current) use of aspirin: Secondary | ICD-10-CM | POA: Diagnosis not present

## 2016-01-27 DIAGNOSIS — S199XXA Unspecified injury of neck, initial encounter: Secondary | ICD-10-CM | POA: Diagnosis not present

## 2016-01-27 DIAGNOSIS — Y9289 Other specified places as the place of occurrence of the external cause: Secondary | ICD-10-CM | POA: Diagnosis not present

## 2016-01-27 DIAGNOSIS — Y998 Other external cause status: Secondary | ICD-10-CM | POA: Diagnosis not present

## 2016-01-27 DIAGNOSIS — S301XXA Contusion of abdominal wall, initial encounter: Secondary | ICD-10-CM | POA: Insufficient documentation

## 2016-01-27 DIAGNOSIS — E119 Type 2 diabetes mellitus without complications: Secondary | ICD-10-CM | POA: Diagnosis not present

## 2016-01-27 DIAGNOSIS — Z91040 Latex allergy status: Secondary | ICD-10-CM | POA: Diagnosis not present

## 2016-01-27 DIAGNOSIS — E039 Hypothyroidism, unspecified: Secondary | ICD-10-CM | POA: Diagnosis not present

## 2016-01-27 DIAGNOSIS — R51 Headache: Secondary | ICD-10-CM | POA: Diagnosis not present

## 2016-01-27 DIAGNOSIS — S40211A Abrasion of right shoulder, initial encounter: Secondary | ICD-10-CM | POA: Diagnosis not present

## 2016-01-27 DIAGNOSIS — M1990 Unspecified osteoarthritis, unspecified site: Secondary | ICD-10-CM | POA: Insufficient documentation

## 2016-01-27 DIAGNOSIS — S0990XA Unspecified injury of head, initial encounter: Secondary | ICD-10-CM | POA: Insufficient documentation

## 2016-01-27 DIAGNOSIS — T7411XA Adult physical abuse, confirmed, initial encounter: Secondary | ICD-10-CM | POA: Insufficient documentation

## 2016-01-27 DIAGNOSIS — S60211A Contusion of right wrist, initial encounter: Secondary | ICD-10-CM | POA: Diagnosis not present

## 2016-01-27 DIAGNOSIS — M79631 Pain in right forearm: Secondary | ICD-10-CM | POA: Diagnosis not present

## 2016-01-27 DIAGNOSIS — Z7952 Long term (current) use of systemic steroids: Secondary | ICD-10-CM | POA: Insufficient documentation

## 2016-01-27 DIAGNOSIS — S5011XA Contusion of right forearm, initial encounter: Secondary | ICD-10-CM | POA: Diagnosis not present

## 2016-01-27 DIAGNOSIS — M7989 Other specified soft tissue disorders: Secondary | ICD-10-CM | POA: Diagnosis not present

## 2016-01-27 DIAGNOSIS — Z8719 Personal history of other diseases of the digestive system: Secondary | ICD-10-CM | POA: Insufficient documentation

## 2016-01-27 DIAGNOSIS — Z7984 Long term (current) use of oral hypoglycemic drugs: Secondary | ICD-10-CM | POA: Diagnosis not present

## 2016-01-27 DIAGNOSIS — S50812A Abrasion of left forearm, initial encounter: Secondary | ICD-10-CM | POA: Insufficient documentation

## 2016-01-27 DIAGNOSIS — M542 Cervicalgia: Secondary | ICD-10-CM | POA: Diagnosis not present

## 2016-01-27 DIAGNOSIS — I10 Essential (primary) hypertension: Secondary | ICD-10-CM | POA: Insufficient documentation

## 2016-01-27 MED ORDER — OXYCODONE-ACETAMINOPHEN 5-325 MG PO TABS: 1.0000 | ORAL_TABLET | Freq: Four times a day (QID) | ORAL | Status: DC | PRN

## 2016-01-27 MED ORDER — OXYCODONE-ACETAMINOPHEN 5-325 MG PO TABS
2.0000 | ORAL_TABLET | Freq: Once | ORAL | Status: AC
  Administered 2016-01-27: 2 via ORAL
  Filled 2016-01-27: qty 2
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
## 2016-02-09 DIAGNOSIS — M1711 Unilateral primary osteoarthritis, right knee: Secondary | ICD-10-CM | POA: Diagnosis not present

## 2016-02-09 DIAGNOSIS — M1712 Unilateral primary osteoarthritis, left knee: Secondary | ICD-10-CM | POA: Diagnosis not present

## 2016-02-23 DIAGNOSIS — H18413 Arcus senilis, bilateral: Secondary | ICD-10-CM | POA: Diagnosis not present

## 2016-02-23 DIAGNOSIS — H11153 Pinguecula, bilateral: Secondary | ICD-10-CM | POA: Diagnosis not present

## 2016-02-23 DIAGNOSIS — H52223 Regular astigmatism, bilateral: Secondary | ICD-10-CM | POA: Diagnosis not present

## 2016-02-23 DIAGNOSIS — Z9849 Cataract extraction status, unspecified eye: Secondary | ICD-10-CM | POA: Diagnosis not present

## 2016-02-23 DIAGNOSIS — H11423 Conjunctival edema, bilateral: Secondary | ICD-10-CM | POA: Diagnosis not present

## 2016-02-23 DIAGNOSIS — E119 Type 2 diabetes mellitus without complications: Secondary | ICD-10-CM | POA: Diagnosis not present

## 2016-02-23 DIAGNOSIS — H401122 Primary open-angle glaucoma, left eye, moderate stage: Secondary | ICD-10-CM | POA: Diagnosis not present

## 2016-02-23 DIAGNOSIS — H401132 Primary open-angle glaucoma, bilateral, moderate stage: Secondary | ICD-10-CM | POA: Diagnosis not present

## 2016-02-23 DIAGNOSIS — H401111 Primary open-angle glaucoma, right eye, mild stage: Secondary | ICD-10-CM | POA: Diagnosis not present

## 2016-02-23 DIAGNOSIS — Z961 Presence of intraocular lens: Secondary | ICD-10-CM | POA: Diagnosis not present

## 2016-02-23 DIAGNOSIS — H5201 Hypermetropia, right eye: Secondary | ICD-10-CM | POA: Diagnosis not present

## 2016-02-23 DIAGNOSIS — Z7984 Long term (current) use of oral hypoglycemic drugs: Secondary | ICD-10-CM | POA: Diagnosis not present

## 2016-03-14 ENCOUNTER — Encounter: Payer: Self-pay | Admitting: Internal Medicine

## 2016-03-14 ENCOUNTER — Ambulatory Visit: Payer: Medicare Other | Attending: Internal Medicine | Admitting: Internal Medicine

## 2016-03-14 VITALS — BP 148/86 | HR 57 | Temp 97.6°F | Wt 268.2 lb

## 2016-03-14 DIAGNOSIS — Z79899 Other long term (current) drug therapy: Secondary | ICD-10-CM | POA: Diagnosis not present

## 2016-03-14 DIAGNOSIS — E039 Hypothyroidism, unspecified: Secondary | ICD-10-CM | POA: Diagnosis not present

## 2016-03-14 DIAGNOSIS — F419 Anxiety disorder, unspecified: Secondary | ICD-10-CM | POA: Diagnosis not present

## 2016-03-14 DIAGNOSIS — E1122 Type 2 diabetes mellitus with diabetic chronic kidney disease: Secondary | ICD-10-CM

## 2016-03-14 DIAGNOSIS — I10 Essential (primary) hypertension: Secondary | ICD-10-CM | POA: Diagnosis not present

## 2016-03-14 DIAGNOSIS — Z880 Allergy status to penicillin: Secondary | ICD-10-CM | POA: Insufficient documentation

## 2016-03-14 DIAGNOSIS — Z87891 Personal history of nicotine dependence: Secondary | ICD-10-CM | POA: Diagnosis not present

## 2016-03-14 DIAGNOSIS — Z114 Encounter for screening for human immunodeficiency virus [HIV]: Secondary | ICD-10-CM | POA: Diagnosis not present

## 2016-03-14 DIAGNOSIS — E119 Type 2 diabetes mellitus without complications: Secondary | ICD-10-CM | POA: Diagnosis not present

## 2016-03-14 DIAGNOSIS — F329 Major depressive disorder, single episode, unspecified: Secondary | ICD-10-CM | POA: Insufficient documentation

## 2016-03-14 DIAGNOSIS — N183 Chronic kidney disease, stage 3 unspecified: Secondary | ICD-10-CM

## 2016-03-14 DIAGNOSIS — Z1159 Encounter for screening for other viral diseases: Secondary | ICD-10-CM

## 2016-03-14 LAB — CBC WITH DIFFERENTIAL/PLATELET
Basophils Absolute: 95 cells/uL (ref 0–200)
Basophils Relative: 1 %
Eosinophils Absolute: 475 cells/uL (ref 15–500)
Eosinophils Relative: 5 %
HCT: 41.3 % (ref 35.0–45.0)
Hemoglobin: 14.3 g/dL (ref 11.7–15.5)
Lymphocytes Relative: 25 %
Lymphs Abs: 2375 cells/uL (ref 850–3900)
MCH: 30.8 pg (ref 27.0–33.0)
MCHC: 34.6 g/dL (ref 32.0–36.0)
MCV: 88.8 fL (ref 80.0–100.0)
MPV: 10.2 fL (ref 7.5–12.5)
Monocytes Absolute: 475 cells/uL (ref 200–950)
Monocytes Relative: 5 %
Neutro Abs: 6080 cells/uL (ref 1500–7800)
Neutrophils Relative %: 64 %
Platelets: 261 10*3/uL (ref 140–400)
RBC: 4.65 MIL/uL (ref 3.80–5.10)
RDW: 14 % (ref 11.0–15.0)
WBC: 9.5 10*3/uL (ref 3.8–10.8)

## 2016-03-14 LAB — TSH: TSH: 4.82 mIU/L — ABNORMAL HIGH

## 2016-03-14 LAB — GLUCOSE, POCT (MANUAL RESULT ENTRY): POC Glucose: 127 mg/dl — AB (ref 70–99)

## 2016-03-14 LAB — POCT GLYCOSYLATED HEMOGLOBIN (HGB A1C): Hemoglobin A1C: 8.7

## 2016-03-14 MED ORDER — LISINOPRIL 20 MG PO TABS: 20.0000 mg | ORAL_TABLET | Freq: Every day | ORAL | Status: DC

## 2016-03-14 MED ORDER — LUBIPROSTONE 24 MCG PO CAPS: 24.0000 ug | ORAL_CAPSULE | Freq: Every day | ORAL | Status: DC

## 2016-03-14 MED ORDER — HYDROCHLOROTHIAZIDE 12.5 MG PO CAPS
12.5000 mg | ORAL_CAPSULE | Freq: Every day | ORAL | Status: DC
Start: 2016-03-14 — End: 2016-07-19

## 2016-03-14 MED ORDER — ESCITALOPRAM OXALATE 10 MG PO TABS: 10.0000 mg | ORAL_TABLET | Freq: Every day | ORAL | Status: DC

## 2016-03-14 MED ORDER — PRAVASTATIN SODIUM 20 MG PO TABS
20.0000 mg | ORAL_TABLET | Freq: Every day | ORAL | Status: DC
Start: 2016-03-14 — End: 2016-07-19

## 2016-03-14 MED ORDER — GLIPIZIDE 10 MG PO TABS
10.0000 mg | ORAL_TABLET | Freq: Two times a day (BID) | ORAL | Status: DC
Start: 2016-03-14 — End: 2016-07-19

## 2016-03-14 MED ORDER — ASPIRIN 81 MG PO TABS: 81.0000 mg | ORAL_TABLET | Freq: Every day | ORAL | Status: DC
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
## 2016-03-15 ENCOUNTER — Other Ambulatory Visit: Payer: Self-pay | Admitting: Internal Medicine

## 2016-03-15 LAB — BASIC METABOLIC PANEL WITH GFR
BUN: 16 mg/dL (ref 7–25)
CO2: 21 mmol/L (ref 20–31)
Calcium: 9.2 mg/dL (ref 8.6–10.4)
Chloride: 103 mmol/L (ref 98–110)
Creat: 1.12 mg/dL — ABNORMAL HIGH (ref 0.50–0.99)
GFR, Est African American: 61 mL/min (ref >=60)
GFR, Est Non African American: 53 mL/min — ABNORMAL LOW (ref >=60)
Glucose, Bld: 106 mg/dL — ABNORMAL HIGH (ref 65–99)
Potassium: 3.9 mmol/L (ref 3.5–5.3)
Sodium: 137 mmol/L (ref 135–146)

## 2016-03-15 LAB — HEPATITIS C ANTIBODY: HCV Ab: NEGATIVE

## 2016-03-15 LAB — HIV ANTIBODY (ROUTINE TESTING W REFLEX): HIV 1&2 Ab, 4th Generation: NONREACTIVE

## 2016-03-15 LAB — LIPID PANEL
Cholesterol: 161 mg/dL (ref 125–200)
HDL: 45 mg/dL — ABNORMAL LOW (ref >=46)
LDL Cholesterol: 104 mg/dL (ref <130)
Total CHOL/HDL Ratio: 3.6 Ratio (ref <=5.0)
Triglycerides: 61 mg/dL (ref <150)
VLDL: 12 mg/dL (ref <30)

## 2016-03-15 MED ORDER — LEVOTHYROXINE SODIUM 88 MCG PO TABS: 88.0000 ug | ORAL_TABLET | Freq: Every day | ORAL | Status: DC

## 2016-03-17 ENCOUNTER — Telehealth: Payer: Self-pay | Admitting: Internal Medicine

## 2016-03-17 NOTE — Telephone Encounter (Signed)
Pt. Called stating that she received a call from this facility. NO note was put in the  System that the nurse had called the pt. Please f/u

## 2016-03-19 ENCOUNTER — Encounter (HOSPITAL_COMMUNITY): Payer: Self-pay | Admitting: Emergency Medicine

## 2016-03-19 ENCOUNTER — Ambulatory Visit (HOSPITAL_COMMUNITY)
Admission: EM | Admit: 2016-03-19 | Discharge: 2016-03-19 | Disposition: A | Discharge status: Home / Self Care | Payer: Medicare Other | Attending: Emergency Medicine | Admitting: Emergency Medicine

## 2016-03-19 DIAGNOSIS — Z91041 Radiographic dye allergy status: Secondary | ICD-10-CM | POA: Insufficient documentation

## 2016-03-19 DIAGNOSIS — E039 Hypothyroidism, unspecified: Secondary | ICD-10-CM | POA: Insufficient documentation

## 2016-03-19 DIAGNOSIS — M1990 Unspecified osteoarthritis, unspecified site: Secondary | ICD-10-CM | POA: Diagnosis not present

## 2016-03-19 DIAGNOSIS — I10 Essential (primary) hypertension: Secondary | ICD-10-CM | POA: Insufficient documentation

## 2016-03-19 DIAGNOSIS — Z880 Allergy status to penicillin: Secondary | ICD-10-CM | POA: Insufficient documentation

## 2016-03-19 DIAGNOSIS — Z7982 Long term (current) use of aspirin: Secondary | ICD-10-CM | POA: Diagnosis not present

## 2016-03-19 DIAGNOSIS — Z888 Allergy status to other drugs, medicaments and biological substances status: Secondary | ICD-10-CM | POA: Diagnosis not present

## 2016-03-19 DIAGNOSIS — J302 Other seasonal allergic rhinitis: Secondary | ICD-10-CM | POA: Diagnosis not present

## 2016-03-19 DIAGNOSIS — Z79899 Other long term (current) drug therapy: Secondary | ICD-10-CM | POA: Diagnosis not present

## 2016-03-19 DIAGNOSIS — H66002 Acute suppurative otitis media without spontaneous rupture of ear drum, left ear: Secondary | ICD-10-CM | POA: Diagnosis not present

## 2016-03-19 DIAGNOSIS — E119 Type 2 diabetes mellitus without complications: Secondary | ICD-10-CM | POA: Diagnosis not present

## 2016-03-19 DIAGNOSIS — R0982 Postnasal drip: Secondary | ICD-10-CM | POA: Insufficient documentation

## 2016-03-19 DIAGNOSIS — Z7984 Long term (current) use of oral hypoglycemic drugs: Secondary | ICD-10-CM | POA: Insufficient documentation

## 2016-03-19 DIAGNOSIS — Z87891 Personal history of nicotine dependence: Secondary | ICD-10-CM | POA: Diagnosis not present

## 2016-03-19 DIAGNOSIS — Z91040 Latex allergy status: Secondary | ICD-10-CM | POA: Insufficient documentation

## 2016-03-19 DIAGNOSIS — J309 Allergic rhinitis, unspecified: Secondary | ICD-10-CM | POA: Diagnosis not present

## 2016-03-19 DIAGNOSIS — J029 Acute pharyngitis, unspecified: Secondary | ICD-10-CM | POA: Diagnosis not present

## 2016-03-19 MED ORDER — PREDNISONE 20 MG PO TABS
ORAL_TABLET | ORAL | Status: DC
Start: 2016-03-19 — End: 2016-04-04

## 2016-03-19 MED ORDER — AZITHROMYCIN 250 MG PO TABS: ORAL_TABLET | ORAL | Status: DC

## 2016-03-19 NOTE — ED Provider Notes (Signed)
CSN: [id number]     Arrival date & time 03/19/16  1215 History   First MD Initiated Contact with Patient 03/19/16 1355     Chief Complaint  Patient presents with  . Otalgia  . Sore Throat   (Consider location/radiation/quality/duration/timing/severity/associated sxs/prior Treatment) HPI Comments: 60 year old female complaining of bilateral earaches, sore throat, PND, upper respiratory congestion and headache for 3 days. Denies fever, cough, shortness of breath or chest pain.  Patient is a 60 y.o. female presenting with ear pain and pharyngitis.  Otalgia Associated symptoms: congestion, rhinorrhea and sore throat   Associated symptoms: no fever, no neck pain and no rash   Sore Throat Pertinent negatives include no shortness of breath.    Past Medical History  Diagnosis Date  . Arthritis   . Asthma   . Diabetes mellitus   . Heart murmur   . Hypertension   . Thyroid disease     hypothyroidism  . Hemorrhoids    Past Surgical History  Procedure Laterality Date  . Thyroidectomy    . Abdominal hysterectomy    . Total knee arthroplasty      right knee  . Knee surgery      left   History reviewed. No pertinent family history. Social History  Substance Use Topics  . Smoking status: Former Smoker    Quit date: 09/18/2001  . Smokeless tobacco: None  . Alcohol Use: Yes   OB History    No data available     Review of Systems  Constitutional: Negative for fever, chills, activity change, appetite change and fatigue.  HENT: Positive for congestion, ear pain, postnasal drip, rhinorrhea, sinus pressure and sore throat. Negative for facial swelling.   Eyes: Negative.   Respiratory: Negative.  Negative for choking, chest tightness and shortness of breath.   Cardiovascular: Negative.   Genitourinary: Negative.   Musculoskeletal: Negative for neck pain and neck stiffness.  Skin: Negative for pallor and rash.  Neurological: Negative.     Allergies  Latex; Penicillins; Warfarin  sodium; and Iodinated diagnostic agents  Home Medications   Prior to Admission medications   Medication Sig Start Date End Date Taking? Authorizing Provider  aspirin 81 MG tablet Take 1 tablet (81 mg total) by mouth daily. 03/14/16  Yes Maren Reamer, MD  escitalopram (LEXAPRO) 10 MG tablet Take 1 tablet (10 mg total) by mouth daily. 03/14/16  Yes Dawn Lazarus Gowda, MD  glipiZIDE (GLUCOTROL) 10 MG tablet Take 1 tablet (10 mg total) by mouth 2 (two) times daily before a meal. 03/14/16  Yes Maren Reamer, MD  glucose blood (CHOICE DM FORA G20 TEST STRIPS) test strip Use as instructed 09/30/13  Yes Olugbemiga E Doreene Burke, MD  hydrochlorothiazide (MICROZIDE) 12.5 MG capsule Take 1 capsule (12.5 mg total) by mouth daily. Must have office visit for refills 03/14/16  Yes Maren Reamer, MD  Lancets (FREESTYLE) lancets Use as instructed 10/20/13  Yes Reyne Dumas, MD  levothyroxine (SYNTHROID, LEVOTHROID) 88 MCG tablet Take 1 tablet (88 mcg total) by mouth daily. 03/15/16  Yes Maren Reamer, MD  lisinopril (PRINIVIL,ZESTRIL) 20 MG tablet Take 1 tablet (20 mg total) by mouth daily. 03/14/16  Yes Maren Reamer, MD  loratadine (CLARITIN) 10 MG tablet Take 1 tablet (10 mg total) by mouth daily. 08/18/15  Yes Lance Bosch, NP  lubiprostone (AMITIZA) 24 MCG capsule Take 1 capsule (24 mcg total) by mouth daily with breakfast. 03/14/16  Yes Maren Reamer, MD  oxyCODONE-acetaminophen (PERCOCET/ROXICET) 5-325 MG  tablet Take 1 tablet by mouth every 6 (six) hours as needed for severe pain. 01/27/16  Yes Domenic Moras, PA-C  pravastatin (PRAVACHOL) 20 MG tablet Take 1 tablet (20 mg total) by mouth daily. 03/14/16  Yes Maren Reamer, MD  Albuterol Sulfate (PROAIR RESPICLICK) 123XX123 (90 BASE) MCG/ACT AEPB Inhale 2 puffs into the lungs every 6 (six) hours as needed. 08/24/15   Lance Bosch, NP  azithromycin (ZITHROMAX) 250 MG tablet 2 tabs po on day one, then one tablet po once daily on days 2-5. 03/19/16   Janne Napoleon, NP   ipratropium (ATROVENT) 0.06 % nasal spray Place 2 sprays into both nostrils 4 (four) times daily. Patient not taking: Reported on 11/05/2014 09/07/14   Melony Overly, MD  NEOMYCIN-POLYMYXIN-HYDROCORTISONE (CORTISPORIN) 1 % SOLN otic solution Place 3 drops into the right ear every 8 (eight) hours. 11/05/14   Lance Bosch, NP  predniSONE (DELTASONE) 20 MG tablet Take 3 tabs po on first day, 2 tabs second day, 2 tabs third day, 1 tab fourth day, 1 tab 5th day. Take with food. 03/19/16   Janne Napoleon, NP  sulfamethoxazole-trimethoprim (BACTRIM DS,SEPTRA DS) 800-160 MG tablet Take 1 tablet by mouth 2 (two) times daily. Patient not taking: Reported on 03/14/2016 08/18/15   Lance Bosch, NP  travoprost, benzalkonium, (TRAVATAN) 0.004 % ophthalmic solution Place 1 drop into both eyes daily.     Historical Provider, MD   Meds Ordered and Administered this Visit  Medications - No data to display  BP 138/70 mmHg  Pulse 65  Temp(Src) 98.7 F (37.1 C) (Oral)  Resp 12  SpO2 100% No data found.   Physical Exam  Constitutional: She is oriented to person, place, and time. She appears well-developed and well-nourished. No distress.  HENT:  Mouth/Throat: No oropharyngeal exudate.  Right TM is retracted. No effusion. Left TM bulging, darkly erythematous, loss of light reflex. Oropharynx with minor erythema without age days. No visible PND.  Eyes: Conjunctivae and EOM are normal.  Neck: Normal range of motion. Neck supple.  Cardiovascular: Normal rate, regular rhythm and normal heart sounds.   Pulmonary/Chest: Effort normal and breath sounds normal. No respiratory distress.  Musculoskeletal: Normal range of motion. She exhibits no edema.  Lymphadenopathy:    She has no cervical adenopathy.  Neurological: She is alert and oriented to person, place, and time.  Skin: Skin is warm and dry. No rash noted.  Psychiatric: She has a normal mood and affect.  Nursing note and vitals reviewed.   ED Course   Procedures (including critical care time)  Labs Review Labs Reviewed - No data to display  Imaging Review No results found.   Visual Acuity Review  Right Eye Distance:   Left Eye Distance:   Bilateral Distance:    Right Eye Near:   Left Eye Near:    Bilateral Near:         MDM   1. Other seasonal allergic rhinitis   2. Allergic sinusitis   3. PND (post-nasal drip)   4. Allergic pharyngitis   5. Acute suppurative otitis media of left ear without spontaneous rupture of tympanic membrane, recurrence not specified    For nasal and head congestion may take Sudafed PE 10 mg every 4 hours as needed. Saline nasal spray used frequently. For drainage may use Allegra, Claritin or Zyrtec. If you need stronger medicine to stop drainage may take Chlor-Trimeton 2-4 mg every 4 hours. This may cause drowsiness. Ibuprofen 600 mg every  6 hours as needed for pain, discomfort or fever. Drink plenty of fluids and stay well-hydrated. Meds ordered this encounter  Medications  . predniSONE (DELTASONE) 20 MG tablet    Sig: Take 3 tabs po on first day, 2 tabs second day, 2 tabs third day, 1 tab fourth day, 1 tab 5th day. Take with food.    Dispense:  9 tablet    Refill:  0    Order Specific Question:  Supervising Provider    Answer:  Melony Overly [id number]  . azithromycin (ZITHROMAX) 250 MG tablet    Sig: 2 tabs po on day one, then one tablet po once daily on days 2-5.    Dispense:  6 tablet    Refill:  0    Order Specific Question:  Supervising Provider    Answer:  Melony Overly [id number]       Janne Napoleon, NP 03/19/16 1414

## 2016-03-19 NOTE — ED Notes (Signed)
The patient presented to the [facility] with a complaint of a sore throat, cough, and bilateral ear pain for 4 days.

## 2016-03-19 NOTE — Discharge Instructions (Signed)
Allergic Rhinitis For nasal and head congestion may take Sudafed PE 10 mg every 4 hours as needed. Saline nasal spray used frequently. For drainage may use Allegra, Claritin or Zyrtec. If you need stronger medicine to stop drainage may take Chlor-Trimeton 2-4 mg every 4 hours. This may cause drowsiness. Ibuprofen 600 mg every 6 hours as needed for pain, discomfort or fever. Drink plenty of fluids and stay well-hydrated.  Allergic rhinitis is when the mucous membranes in the nose respond to allergens. Allergens are particles in the air that cause your body to have an allergic reaction. This causes you to release allergic antibodies. Through a chain of events, these eventually cause you to release histamine into the blood stream. Although meant to protect the body, it is this release of histamine that causes your discomfort, such as frequent sneezing, congestion, and an itchy, runny nose.  CAUSES Seasonal allergic rhinitis (hay fever) is caused by pollen allergens that may come from grasses, trees, and weeds. Year-round allergic rhinitis (perennial allergic rhinitis) is caused by allergens such as house dust mites, pet dander, and mold spores. SYMPTOMS  Nasal stuffiness (congestion).  Itchy, runny nose with sneezing and tearing of the eyes. DIAGNOSIS Your health care provider can help you determine the allergen or allergens that trigger your symptoms. If you and your health care provider are unable to determine the allergen, skin or blood testing may be used. Your health care provider will diagnose your condition after taking your health history and performing a physical exam. Your health care provider may assess you for other related conditions, such as asthma, pink eye, or an ear infection. TREATMENT Allergic rhinitis does not have a cure, but it can be controlled by:  Medicines that block allergy symptoms. These may include allergy shots, nasal sprays, and oral antihistamines.  Avoiding the  allergen. Hay fever may often be treated with antihistamines in pill or nasal spray forms. Antihistamines block the effects of histamine. There are over-the-counter medicines that may help with nasal congestion and swelling around the eyes. Check with your health care provider before taking or giving this medicine. If avoiding the allergen or the medicine prescribed do not work, there are many new medicines your health care provider can prescribe. Stronger medicine may be used if initial measures are ineffective. Desensitizing injections can be used if medicine and avoidance does not work. Desensitization is when a patient is given ongoing shots until the body becomes less sensitive to the allergen. Make sure you follow up with your health care provider if problems continue. HOME CARE INSTRUCTIONS It is not possible to completely avoid allergens, but you can reduce your symptoms by taking steps to limit your exposure to them. It helps to know exactly what you are allergic to so that you can avoid your specific triggers. SEEK MEDICAL CARE IF:  You have a fever.  You develop a cough that does not stop easily (persistent).  You have shortness of breath.  You start wheezing.  Symptoms interfere with normal daily activities.   This information is not intended to replace advice given to you by your health care provider. Make sure you discuss any questions you have with your health care provider.   Document Released: 05/30/2001 Document Revised: 09/25/2014 Document Reviewed: 05/12/2013 Elsevier Interactive Patient Education 2016 [employer].  Otitis Media, Adult Otitis media is redness, soreness, and inflammation of the middle ear. Otitis media may be caused by allergies or, most commonly, by infection. Often it occurs as a complication  of the common cold. SIGNS AND SYMPTOMS Symptoms of otitis media may include:  Earache.  Fever.  Ringing in your ear.  Headache.  Leakage of fluid from  the ear. DIAGNOSIS To diagnose otitis media, your health care provider will examine your ear with an otoscope. This is an instrument that allows your health care provider to see into your ear in order to examine your eardrum. Your health care provider also will ask you questions about your symptoms. TREATMENT  Typically, otitis media resolves on its own within 3-5 days. Your health care provider may prescribe medicine to ease your symptoms of pain. If otitis media does not resolve within 5 days or is recurrent, your health care provider may prescribe antibiotic medicines if he or she suspects that a bacterial infection is the cause. HOME CARE INSTRUCTIONS   If you were prescribed an antibiotic medicine, finish it all even if you start to feel better.  Take medicines only as directed by your health care provider.  Keep all follow-up visits as directed by your health care provider. SEEK MEDICAL CARE IF:  You have otitis media only in one ear, or bleeding from your nose, or both.  You notice a lump on your neck.  You are not getting better in 3-5 days.  You feel worse instead of better. SEEK IMMEDIATE MEDICAL CARE IF:   You have pain that is not controlled with medicine.  You have swelling, redness, or pain around your ear or stiffness in your neck.  You notice that part of your face is paralyzed.  You notice that the bone behind your ear (mastoid) is tender when you touch it. MAKE SURE YOU:   Understand these instructions.  Will watch your condition.  Will get help right away if you are not doing well or get worse.   This information is not intended to replace advice given to you by your health care provider. Make sure you discuss any questions you have with your health care provider.   Document Released: 06/09/2004 Document Revised: 09/25/2014 Document Reviewed: 04/01/2013 Elsevier Interactive Patient Education 2016 [employer]  Sore Throat A sore throat is a painful,  burning, sore, or scratchy feeling of the throat. There may be pain or tenderness when swallowing or talking. You may have other symptoms with a sore throat. These include coughing, sneezing, fever, or a swollen neck. A sore throat is often the first sign of another sickness. These sicknesses may include a cold, flu, strep throat, or an infection called mono. Most sore throats go away without medical treatment.  HOME CARE   Only take medicine as told by your doctor.  Drink enough fluids to keep your pee (urine) clear or pale yellow.  Rest as needed.  Try using throat sprays, lozenges, or suck on hard candy (if older than 4 years or as told).  Sip warm liquids, such as broth, herbal tea, or warm water with honey. Try sucking on frozen ice pops or drinking cold liquids.  Rinse the mouth (gargle) with salt water. Mix 1 teaspoon salt with 8 ounces of water.  Do not smoke. Avoid being around others when they are smoking.  Put a humidifier in your bedroom at night to moisten the air. You can also turn on a hot shower and sit in the bathroom for 5-10 minutes. Be sure the bathroom door is closed. GET HELP RIGHT AWAY IF:   You have trouble breathing.  You cannot swallow fluids, soft foods, or your spit (saliva).  You have more puffiness (swelling) in the throat.  Your sore throat does not get better in 7 days.  You feel sick to your stomach (nauseous) and throw up (vomit).  You have a fever or lasting symptoms for more than 2-3 days.  You have a fever and your symptoms suddenly get worse. MAKE SURE YOU:   Understand these instructions.  Will watch your condition.  Will get help right away if you are not doing well or get worse.   This information is not intended to replace advice given to you by your health care provider. Make sure you discuss any questions you have with your health care provider.   Document Released: 06/13/2008 Document Revised: 05/29/2012 Document Reviewed:  05/12/2012 Elsevier Interactive Patient Education [employer].

## 2016-03-22 LAB — CULTURE, GROUP A STREP (THRC)

## 2016-03-22 NOTE — Telephone Encounter (Signed)
Patient verified DOB Patient is aware of Hepatitis C and HIV screening being negative. Patient is aware of Cholesterol levels look good, but given hx of diabetes, please take for cardiac and stroke protection.  Patient is aware of kidney function remains abnormal, but stable, ckd stage 3 due to diabetes, Patient advised to tighten diabetic control to protect kidneys from getting worse. Patient informed of thyroid function being off. Patient is aware of PCP stopping current synthroid 42mcg and increased the dose to 8mcg w/ new prescription. Patient advised to please pick up and take daily.  Patient is aware ofHypothyroidism causing worsening fatigue if not appropriately treated. No further questions at this time.

## 2016-04-03 ENCOUNTER — Encounter (HOSPITAL_COMMUNITY): Payer: Self-pay | Admitting: Emergency Medicine

## 2016-04-03 ENCOUNTER — Emergency Department (HOSPITAL_COMMUNITY): Payer: Medicare Other

## 2016-04-03 ENCOUNTER — Emergency Department (HOSPITAL_COMMUNITY)
Admission: EM | Admit: 2016-04-03 | Discharge: 2016-04-03 | Disposition: A | Discharge status: Home / Self Care | Payer: Medicare Other | Attending: Emergency Medicine | Admitting: Emergency Medicine

## 2016-04-03 DIAGNOSIS — E119 Type 2 diabetes mellitus without complications: Secondary | ICD-10-CM | POA: Diagnosis not present

## 2016-04-03 DIAGNOSIS — I10 Essential (primary) hypertension: Secondary | ICD-10-CM | POA: Diagnosis not present

## 2016-04-03 DIAGNOSIS — J45909 Unspecified asthma, uncomplicated: Secondary | ICD-10-CM | POA: Diagnosis not present

## 2016-04-03 DIAGNOSIS — Z7984 Long term (current) use of oral hypoglycemic drugs: Secondary | ICD-10-CM | POA: Diagnosis not present

## 2016-04-03 DIAGNOSIS — Z91040 Latex allergy status: Secondary | ICD-10-CM | POA: Insufficient documentation

## 2016-04-03 DIAGNOSIS — M542 Cervicalgia: Secondary | ICD-10-CM

## 2016-04-03 DIAGNOSIS — Z79899 Other long term (current) drug therapy: Secondary | ICD-10-CM | POA: Diagnosis not present

## 2016-04-03 DIAGNOSIS — Z87891 Personal history of nicotine dependence: Secondary | ICD-10-CM | POA: Diagnosis not present

## 2016-04-03 DIAGNOSIS — Z96651 Presence of right artificial knee joint: Secondary | ICD-10-CM | POA: Diagnosis not present

## 2016-04-03 DIAGNOSIS — Z7982 Long term (current) use of aspirin: Secondary | ICD-10-CM | POA: Diagnosis not present

## 2016-04-03 DIAGNOSIS — M436 Torticollis: Secondary | ICD-10-CM | POA: Diagnosis present

## 2016-04-03 LAB — BASIC METABOLIC PANEL
Anion gap: 6 (ref 5–15)
BUN: 9 mg/dL (ref 6–20)
CO2: 24 mmol/L (ref 22–32)
Calcium: 8.8 mg/dL — ABNORMAL LOW (ref 8.9–10.3)
Chloride: 105 mmol/L (ref 101–111)
Creatinine, Ser: 0.99 mg/dL (ref 0.44–1.00)
GFR calc Af Amer: >60 mL/min (ref >60)
GFR calc non Af Amer: >60 mL/min (ref >60)
Glucose, Bld: 208 mg/dL — ABNORMAL HIGH (ref 65–99)
Potassium: 3.9 mmol/L (ref 3.5–5.1)
Sodium: 135 mmol/L (ref 135–145)

## 2016-04-03 MED ORDER — OXYCODONE-ACETAMINOPHEN 5-325 MG PO TABS
1.0000 | ORAL_TABLET | Freq: Once | ORAL | Status: AC
  Administered 2016-04-03: 1 via ORAL
  Filled 2016-04-03: qty 1

## 2016-04-03 MED ORDER — IOPAMIDOL (ISOVUE-300) INJECTION 61%
INTRAVENOUS | Status: AC
  Filled 2016-04-03: qty 75

## 2016-04-03 NOTE — ED Notes (Signed)
MD Jacubowitz at the bedside   

## 2016-04-03 NOTE — ED Provider Notes (Signed)
CSN: [id number]     Arrival date & time 04/03/16  0807 History   First MD Initiated Contact with Patient 04/03/16 [phone]     Chief Complaint  Patient presents with  . Torticollis     (Consider location/radiation/quality/duration/timing/severity/associated sxs/prior Treatment) Patient is a 60 y.o. female presenting with general illness. The history is provided by the patient.  Illness Location:  Left neck Quality:  Aching pain Severity:  Moderate Onset quality:  Gradual Duration:  1 day Timing:  Constant Progression:  Worsening Chronicity:  New Context:  Recent URI Associated symptoms: no abdominal pain, no chest pain, no congestion, no cough, no diarrhea, no fever, no nausea, no rash, no shortness of breath, no sore throat and no vomiting     Past Medical History  Diagnosis Date  . Arthritis   . Asthma   . Diabetes mellitus   . Heart murmur   . Hypertension   . Thyroid disease     hypothyroidism  . Hemorrhoids    Past Surgical History  Procedure Laterality Date  . Thyroidectomy    . Abdominal hysterectomy    . Total knee arthroplasty      right knee  . Knee surgery      left   No family history on file. Social History  Substance Use Topics  . Smoking status: Former Smoker    Quit date: 09/18/2001  . Smokeless tobacco: None  . Alcohol Use: Yes   OB History    No data available     Review of Systems  Constitutional: Negative for fever and chills.  HENT: Negative for congestion and sore throat.   Eyes: Negative for pain.  Respiratory: Negative for cough and shortness of breath.   Cardiovascular: Negative for chest pain and palpitations.  Gastrointestinal: Negative for nausea, vomiting, abdominal pain and diarrhea.  Genitourinary: Negative for dysuria and flank pain.  Musculoskeletal: Positive for neck pain (left). Negative for back pain.  Skin: Negative for rash.  Allergic/Immunologic: Negative.   Neurological: Negative for dizziness and light-headedness.   Psychiatric/Behavioral: Negative for confusion.      Allergies  Latex; Penicillins; Warfarin sodium; and Iodinated diagnostic agents  Home Medications   Prior to Admission medications   Medication Sig Start Date End Date Taking? Authorizing Provider  Albuterol Sulfate (PROAIR RESPICLICK) [id number] (90 BASE) MCG/ACT AEPB Inhale 2 puffs into the lungs every 6 (six) hours as needed. 08/24/15   Lance Bosch, NP  aspirin 81 MG tablet Take 1 tablet (81 mg total) by mouth daily. 03/14/16   Maren Reamer, MD  azithromycin (ZITHROMAX) 250 MG tablet 2 tabs po on day one, then one tablet po once daily on days 2-5. 03/19/16   Janne Napoleon, NP  escitalopram (LEXAPRO) 10 MG tablet Take 1 tablet (10 mg total) by mouth daily. 03/14/16   Maren Reamer, MD  glipiZIDE (GLUCOTROL) 10 MG tablet Take 1 tablet (10 mg total) by mouth 2 (two) times daily before a meal. 03/14/16   Maren Reamer, MD  glucose blood (CHOICE DM FORA G20 TEST STRIPS) test strip Use as instructed 09/30/13   Tresa Garter, MD  hydrochlorothiazide (MICROZIDE) 12.5 MG capsule Take 1 capsule (12.5 mg total) by mouth daily. Must have office visit for refills 03/14/16   Maren Reamer, MD  ipratropium (ATROVENT) 0.06 % nasal spray Place 2 sprays into both nostrils 4 (four) times daily. Patient not taking: Reported on 11/05/2014 09/07/14   Melony Overly, MD  Lancets (FREESTYLE) lancets  Use as instructed 10/20/13   Reyne Dumas, MD  levothyroxine (SYNTHROID, LEVOTHROID) 88 MCG tablet Take 1 tablet (88 mcg total) by mouth daily. 03/15/16   Maren Reamer, MD  lisinopril (PRINIVIL,ZESTRIL) 20 MG tablet Take 1 tablet (20 mg total) by mouth daily. 03/14/16   Maren Reamer, MD  loratadine (CLARITIN) 10 MG tablet Take 1 tablet (10 mg total) by mouth daily. 08/18/15   Lance Bosch, NP  lubiprostone (AMITIZA) 24 MCG capsule Take 1 capsule (24 mcg total) by mouth daily with breakfast. 03/14/16   Maren Reamer, MD  NEOMYCIN-POLYMYXIN-HYDROCORTISONE  (CORTISPORIN) 1 % SOLN otic solution Place 3 drops into the right ear every 8 (eight) hours. 11/05/14   Lance Bosch, NP  oxyCODONE-acetaminophen (PERCOCET/ROXICET) 5-325 MG tablet Take 1 tablet by mouth every 6 (six) hours as needed for severe pain. 01/27/16   Domenic Moras, PA-C  pravastatin (PRAVACHOL) 20 MG tablet Take 1 tablet (20 mg total) by mouth daily. 03/14/16   Maren Reamer, MD  predniSONE (DELTASONE) 20 MG tablet Take 3 tabs po on first day, 2 tabs second day, 2 tabs third day, 1 tab fourth day, 1 tab 5th day. Take with food. 03/19/16   Janne Napoleon, NP  sulfamethoxazole-trimethoprim (BACTRIM DS,SEPTRA DS) 800-160 MG tablet Take 1 tablet by mouth 2 (two) times daily. Patient not taking: Reported on 03/14/2016 08/18/15   Lance Bosch, NP  travoprost, benzalkonium, (TRAVATAN) 0.004 % ophthalmic solution Place 1 drop into both eyes daily.     Historical Provider, MD   BP 125/69 mmHg  Pulse 64  Temp(Src) 98 F (36.7 C) (Oral)  Resp 16  Ht 5\' 2"  (1.575 m)  Wt 117.935 kg  BMI 47.54 kg/m2  SpO2 97% Physical Exam  Constitutional: She is oriented to person, place, and time. She appears well-developed and well-nourished. No distress.  HENT:  Head: Normocephalic and atraumatic.  Mouth/Throat: Uvula is midline, oropharynx is clear and moist and mucous membranes are normal. No oropharyngeal exudate, posterior oropharyngeal edema, posterior oropharyngeal erythema or tonsillar abscesses.  No stridor, posturing, or wheezing  Eyes: Conjunctivae and EOM are normal. Pupils are equal, round, and reactive to light.  Neck: Normal range of motion. Neck supple.    Pain with ROM  Cardiovascular: Normal rate, regular rhythm and normal heart sounds.   Pulmonary/Chest: Effort normal and breath sounds normal. No respiratory distress.  Abdominal: Soft. Bowel sounds are normal. There is no tenderness.  Musculoskeletal: Normal range of motion.  Neurological: She is alert and oriented to person, place, and  time. She has normal reflexes. No cranial nerve deficit.  Skin: Skin is warm and dry. She is not diaphoretic.  Psychiatric: She has a normal mood and affect.    ED Course  Procedures (including critical care time) Labs Review Labs Reviewed  BASIC METABOLIC PANEL - Abnormal; Notable for the following:    Glucose, Bld 208 (*)    Calcium 8.8 (*)    All other components within normal limits    Imaging Review Ct Soft Tissue Neck Wo Contrast  04/03/2016  CLINICAL DATA:  60 y/o F; neck stiffness and pain from last night. History of thyroidectomy in 1973. History of smoking. EXAM: CT NECK WITHOUT CONTRAST TECHNIQUE: Multidetector CT imaging of the neck was performed following the standard protocol without intravenous contrast. COMPARISON:  Cervical radiographs dated 01/27/2016. FINDINGS: Pharynx and larynx: Patent airway. No exophytic mucosal masses identified. Nonspecific effacement of the left piriform sinus. Salivary glands: Unremarkable. Thyroid: Residual  thyroid tissue within the thyroid bed bilaterally. Lymph nodes: No lymphadenopathy. Vascular: Unremarkable. Limited intracranial: Unremarkable. Visualized orbits: The bilateral intra-ocular lens replacement, otherwise unremarkable. Mastoids and visualized paranasal sinuses: Polypoid mucosal thickening within the left anterior nasal passages. Visualized paranasal sinuses are otherwise unremarkable. Visualized mastoid air cells are clear. Skeleton: Positional straightening of cervical lordosis. No listhesis. No acute bony or articular abnormality is identified. Clear in Upper chest: Clear lung apices. IMPRESSION: 1. No discrete mass or collection of the neck is identified. No finding relatable to neck stiffness and pain is identified. 2. Nonspecific effacement of the left piriform sinus. As the patient cannot receive intravenous contrast, direct visualization is recommended to exclude a mucosal lesion. Electronically Signed   By: Kristine Garbe  M.D.   On: 04/03/2016 12:52   I have personally reviewed and evaluated these images and lab results as part of my medical decision-making.   EKG Interpretation None      MDM   Final diagnoses:  Neck ache   The patient is a 60 year old female presented today for onset of left neck pain over the past 24 hours. She reports previous upper respiratory symptoms for which she took antibiotics for an steroids with alleviation. Now reports awakening with left neck pain radiating to the back of her neck with no neurologic symptoms.  DDX sternocleidomastoid strain, peritonsillar abscess/deep space neck infection, epiglottitis, Ludwig's angina, lymphadenitis.  On evaluation body habitus makes physical exam somewhat difficult. Due to the CT neck was performed but patient with contrast allergy. Noncontrast CT of her neck displays no acute abnormalities. Low suspicion for peritonsillar abscess or deep space infection or epiglottitis. No evidence of Ludwig's angina on exam. Possible musculoskeletal strain or lymphadenitis. Dedicated continued pain control at home with Tylenol and to return for any worsening symptoms.  Labs were viewed by myself  incorporated into medical decision making.  Discussed pertinent finding with patient or caregiver prior to discharge with no further questions.  Immediate return precautions given and understood.  Medical decision making supervised by my attending Dr. Winfred Leeds.   Geronimo Boot, MD PGY-3 Emergency Medicine     Geronimo Boot, MD 04/03/16 [place], MD 04/03/16 1630

## 2016-04-03 NOTE — ED Provider Notes (Signed)
Plains of bilateral neck pain since yesterday. Pain is exacerbated by rotating neck from side to side. She has no fever. Does not feel ill. No chest pain no focal numbness or weakness no shortness of breath no cough On exam alert nontoxic HEENT exam no facial asymmetry neck is supple. Pain on rotation from side-to-side. Tender anterior cervical nodes. Neurologic Glasgow Coma Score 15 motor strength 5 over 5 overall  Orlie Dakin, MD 04/03/16 1630

## 2016-04-03 NOTE — ED Notes (Signed)
Pt c/o neck stiffness and pain onset 7:30 last night before going to bed. Pt reports unable to turn her head in any direction. Pt reports having sinusitis last week.  Pt denies recent injury.

## 2016-04-03 NOTE — Discharge Instructions (Signed)

## 2016-04-03 NOTE — ED Notes (Signed)
Phlebotomy at the bedside  

## 2016-04-04 ENCOUNTER — Encounter: Payer: Self-pay | Admitting: Internal Medicine

## 2016-04-04 ENCOUNTER — Ambulatory Visit: Payer: Medicare Other | Attending: Internal Medicine | Admitting: Family Medicine

## 2016-04-04 VITALS — BP 111/69 | HR 97 | Temp 98.5°F | Resp 16 | Ht 62.0 in | Wt 255.0 lb

## 2016-04-04 DIAGNOSIS — Z79899 Other long term (current) drug therapy: Secondary | ICD-10-CM | POA: Diagnosis not present

## 2016-04-04 DIAGNOSIS — E1165 Type 2 diabetes mellitus with hyperglycemia: Secondary | ICD-10-CM

## 2016-04-04 DIAGNOSIS — Z90710 Acquired absence of both cervix and uterus: Secondary | ICD-10-CM | POA: Diagnosis not present

## 2016-04-04 DIAGNOSIS — M542 Cervicalgia: Secondary | ICD-10-CM | POA: Insufficient documentation

## 2016-04-04 DIAGNOSIS — E1122 Type 2 diabetes mellitus with diabetic chronic kidney disease: Secondary | ICD-10-CM

## 2016-04-04 DIAGNOSIS — Z9889 Other specified postprocedural states: Secondary | ICD-10-CM | POA: Insufficient documentation

## 2016-04-04 DIAGNOSIS — Z87891 Personal history of nicotine dependence: Secondary | ICD-10-CM | POA: Insufficient documentation

## 2016-04-04 DIAGNOSIS — N183 Chronic kidney disease, stage 3 unspecified: Secondary | ICD-10-CM

## 2016-04-04 DIAGNOSIS — M436 Torticollis: Secondary | ICD-10-CM | POA: Diagnosis not present

## 2016-04-04 DIAGNOSIS — I129 Hypertensive chronic kidney disease with stage 1 through stage 4 chronic kidney disease, or unspecified chronic kidney disease: Secondary | ICD-10-CM | POA: Diagnosis not present

## 2016-04-04 DIAGNOSIS — R938 Abnormal findings on diagnostic imaging of other specified body structures: Secondary | ICD-10-CM

## 2016-04-04 DIAGNOSIS — D72829 Elevated white blood cell count, unspecified: Secondary | ICD-10-CM | POA: Diagnosis not present

## 2016-04-04 DIAGNOSIS — Z7982 Long term (current) use of aspirin: Secondary | ICD-10-CM | POA: Diagnosis not present

## 2016-04-04 DIAGNOSIS — R9389 Abnormal findings on diagnostic imaging of other specified body structures: Secondary | ICD-10-CM

## 2016-04-04 DIAGNOSIS — IMO0002 Reserved for concepts with insufficient information to code with codable children: Secondary | ICD-10-CM

## 2016-04-04 MED ORDER — ACETAMINOPHEN-CODEINE #3 300-30 MG PO TABS
1.0000 | ORAL_TABLET | Freq: Three times a day (TID) | ORAL | Status: DC | PRN
Start: 2016-04-04 — End: 2017-01-03

## 2016-04-04 MED ORDER — CYCLOBENZAPRINE HCL 10 MG PO TABS: 10.0000 mg | ORAL_TABLET | Freq: Three times a day (TID) | ORAL | Status: DC | PRN

## 2016-04-04 NOTE — Patient Instructions (Signed)
Roree was seen today for neck pain.  Diagnoses and all orders for this visit:  Abnormal CT scan, neck -     Ambulatory referral to ENT  Uncontrolled type 2 diabetes mellitus with stage 3 chronic kidney disease, without long-term current use of insulin (HCC) -     Glucose (CBG)  Neck stiffness -     CBC with Differential -     TSH -     cyclobenzaprine (FLEXERIL) 10 MG tablet; Take 1 tablet (10 mg total) by mouth 3 (three) times daily as needed for muscle spasms. -     acetaminophen-codeine (TYLENOL #3) 300-30 MG tablet; Take 1-2 tablets by mouth every 8 (eight) hours as needed for moderate pain. -     Ambulatory referral to ENT  Call if tylenol #3 and flexeril do not help with pain  You will be called with ENT appointment and lab results  Please f/u in one week with Dr. Janne Napoleon for neck pain and stiffness   Dr. Adrian Blackwater

## 2016-04-04 NOTE — Progress Notes (Signed)
Subjective:  Patient ID: Kayla Bean, female    DOB: 29-Dec-1955  Age: 60 y.o. MRN: [id number]  CC: Neck Pain   HPI Kayla Bean has HTN, DM2 uncontrolled, hypothyroidism due to thyroidectomy she presents for   1. Neck pain: started acutely 2 nights ago while she was talking on the phone to her daughter. Anterior and bilateral neck pain and stiffness. Also with slight occipital headache. Symptoms continued to worsen. She denies associated cough, fever, chills, SOB or chest pain. She denies pain in shoulders or down arms. She denies GI upset. She went to the ED last night due to worsening pain. She was normotensive, CBG 208. CT neck without contrast WNL except nonspecific  effacement of L piriformis sinus.   She was treated with azithromycin on 03/19/16 following an urgent care visit for for bilateral  ear ache and sore throat. Strep culture was negative.   Today, she reports neck pain and stiffness that interferes with sleep and keeps her from driving. Also mild sore throat. No other symptoms. She feels like her neck needs to be adjusted. She denies trauma to her neck and hx of similar neck pain.   Social History  Substance Use Topics  . Smoking status: Former Smoker    Quit date: 09/18/2001  . Smokeless tobacco: Not on file  . Alcohol Use: Yes    Past Surgical History  Procedure Laterality Date  . Thyroidectomy    . Abdominal hysterectomy    . Total knee arthroplasty      right knee  . Knee surgery      left    Outpatient Prescriptions Prior to Visit  Medication Sig Dispense Refill  . Albuterol Sulfate (PROAIR RESPICLICK) [id number] (90 BASE) MCG/ACT AEPB Inhale 2 puffs into the lungs every 6 (six) hours as needed. 1 each 0  . aspirin 81 MG tablet Take 1 tablet (81 mg total) by mouth daily. 90 tablet 3  . azithromycin (ZITHROMAX) 250 MG tablet 2 tabs po on day one, then one tablet po once daily on days 2-5. 6 tablet 0  . escitalopram (LEXAPRO) 10 MG tablet Take 1 tablet (10  mg total) by mouth daily. 30 tablet 3  . glipiZIDE (GLUCOTROL) 10 MG tablet Take 1 tablet (10 mg total) by mouth 2 (two) times daily before a meal. 60 tablet 5  . glucose blood (CHOICE DM FORA G20 TEST STRIPS) test strip Use as instructed 100 each 12  . hydrochlorothiazide (MICROZIDE) 12.5 MG capsule Take 1 capsule (12.5 mg total) by mouth daily. Must have office visit for refills 90 capsule 3  . ipratropium (ATROVENT) 0.06 % nasal spray Place 2 sprays into both nostrils 4 (four) times daily. (Patient not taking: Reported on 11/05/2014) 15 mL 12  . Lancets (FREESTYLE) lancets Use as instructed 100 each 12  . levothyroxine (SYNTHROID, LEVOTHROID) 88 MCG tablet Take 1 tablet (88 mcg total) by mouth daily. 90 tablet 3  . lisinopril (PRINIVIL,ZESTRIL) 20 MG tablet Take 1 tablet (20 mg total) by mouth daily. 90 tablet 3  . loratadine (CLARITIN) 10 MG tablet Take 1 tablet (10 mg total) by mouth daily. 30 tablet 11  . lubiprostone (AMITIZA) 24 MCG capsule Take 1 capsule (24 mcg total) by mouth daily with breakfast. 90 capsule 3  . NEOMYCIN-POLYMYXIN-HYDROCORTISONE (CORTISPORIN) 1 % SOLN otic solution Place 3 drops into the right ear every 8 (eight) hours. 10 mL 0  . oxyCODONE-acetaminophen (PERCOCET/ROXICET) 5-325 MG tablet Take 1 tablet by mouth every 6 (  six) hours as needed for severe pain. 12 tablet 0  . pravastatin (PRAVACHOL) 20 MG tablet Take 1 tablet (20 mg total) by mouth daily. 90 tablet 3  . predniSONE (DELTASONE) 20 MG tablet Take 3 tabs po on first day, 2 tabs second day, 2 tabs third day, 1 tab fourth day, 1 tab 5th day. Take with food. 9 tablet 0  . sulfamethoxazole-trimethoprim (BACTRIM DS,SEPTRA DS) 800-160 MG tablet Take 1 tablet by mouth 2 (two) times daily. (Patient not taking: Reported on 03/14/2016) 14 tablet 0  . travoprost, benzalkonium, (TRAVATAN) 0.004 % ophthalmic solution Place 1 drop into both eyes daily.      No facility-administered medications prior to visit.    ROS Review  of Systems  Constitutional: Negative for fever and chills.  HENT: Positive for sore throat.   Eyes: Negative for visual disturbance.  Respiratory: Negative for cough and shortness of breath.   Cardiovascular: Negative for chest pain.  Gastrointestinal: Negative for abdominal pain and blood in stool.  Musculoskeletal: Positive for neck pain and neck stiffness. Negative for back pain and arthralgias.  Skin: Negative for rash.  Allergic/Immunologic: Negative for immunocompromised state.  Hematological: Negative for adenopathy. Does not bruise/bleed easily.  Psychiatric/Behavioral: Negative for suicidal ideas and dysphoric mood.    Objective:  BP 111/69 mmHg  Pulse 97  Temp(Src) 98.5 F (36.9 C) (Oral)  Resp 16  Ht 5\' 2"  (1.575 m)  Wt 255 lb (115.667 kg)  BMI 46.63 kg/m2  SpO2 96%  BP/Weight 04/04/2016 [id number]  Systolic BP [id number]  Diastolic BP 69 68 70  Wt. (Lbs) 255 260 -  BMI 46.63 47.54 -   Physical Exam  Constitutional: She is oriented to person, place, and time. She appears well-developed and well-nourished. No distress.  HENT:  Head: Normocephalic and atraumatic.  Right Ear: Tympanic membrane, external ear and ear canal normal.  Left Ear: Tympanic membrane, external ear and ear canal normal.  Nose: Mucosal edema present. Right sinus exhibits no maxillary sinus tenderness and no frontal sinus tenderness. Left sinus exhibits no maxillary sinus tenderness and no frontal sinus tenderness.  Mouth/Throat: Uvula is midline, oropharynx is clear and moist and mucous membranes are normal.  Eyes: Conjunctivae and EOM are normal. Pupils are equal, round, and reactive to light. Right eye exhibits no discharge. Left eye exhibits no discharge.  Neck: Muscular tenderness present. No spinous process tenderness present. Decreased range of motion present. No erythema present.    ROM  Extension-10 degrees Flexion-full L rotation- 0 degrees R rotation-15 degrees    Bilateral submandibular fullness without discrete adenopathy   Cardiovascular: Normal rate, regular rhythm, normal heart sounds and intact distal pulses.   Pulmonary/Chest: Effort normal and breath sounds normal.  Musculoskeletal: She exhibits no edema.  Neurological: She is alert and oriented to person, place, and time.  Skin: Skin is warm and dry. No rash noted.  Psychiatric: She has a normal mood and affect.   Lab Results  Component Value Date   TSH 4.82* 03/14/2016   Lab Results  Component Value Date   HGBA1C 8.7 03/14/2016      Assessment & Plan:   There are no diagnoses linked to this encounter. Gaynell was seen today for neck pain.  Diagnoses and all orders for this visit:  Abnormal CT scan, neck -     Ambulatory referral to ENT  Uncontrolled type 2 diabetes mellitus with stage 3 chronic kidney disease, without long-term current use of insulin ([place]) -  Cancel: Glucose (CBG)  Neck stiffness -     CBC with Differential -     TSH -     cyclobenzaprine (FLEXERIL) 10 MG tablet; Take 1 tablet (10 mg total) by mouth 3 (three) times daily as needed for muscle spasms. -     acetaminophen-codeine (TYLENOL #3) 300-30 MG tablet; Take 1-2 tablets by mouth every 8 (eight) hours as needed for moderate pain. -     Ambulatory referral to ENT   Meds ordered this encounter  Medications  . cyclobenzaprine (FLEXERIL) 10 MG tablet    Sig: Take 1 tablet (10 mg total) by mouth 3 (three) times daily as needed for muscle spasms.    Dispense:  30 tablet    Refill:  0  . acetaminophen-codeine (TYLENOL #3) 300-30 MG tablet    Sig: Take 1-2 tablets by mouth every 8 (eight) hours as needed for moderate pain.    Dispense:  30 tablet    Refill:  0    Follow-up: Return in about 1 week (around 04/11/2016) for neck pain .   Boykin Nearing MD

## 2016-04-04 NOTE — Progress Notes (Signed)
Seen at urgent care a week before last was given antibiotics/prednisone for Left ear pain, Sinusitis or.... She did get better. Symptoms return gradually Sunday evening starting with neck stiffness/pain.  Received (1) percocet at the ED.  Was told had muscle skeletal condition. Priscille Heidelberg, RN, BSN

## 2016-04-04 NOTE — Assessment & Plan Note (Addendum)
A: acute onset neck stiffness and pain with non-specific CT neck findings in patient with diabetes, hypothyroidism, HTN.  She is afebrile without evidence of infection  Recent CBG normal BP normal  P: CBC with diff Repeat TSH Flexeril and tylenol #3 for pain control ENT referral to further evaluate CT findings with direct laryngoscopy  Close f/u with PCP  Labs reviewed:  Normal repeat TSH Elevated WBC with elevated neutrophils on CBC with Diff, but afebrile but is diabetic and was recently treated with azithromycin for upper respiratory infection. Will cover with clindamycin 300 mg TID for 10 days. Patient advised to return to ED if symptoms worsen, if she develop hoarseness or fever  Otherwise, ENT appt and f/u with PCP as instructed following yesterdays appt

## 2016-04-05 ENCOUNTER — Telehealth: Payer: Self-pay

## 2016-04-05 DIAGNOSIS — R9389 Abnormal findings on diagnostic imaging of other specified body structures: Secondary | ICD-10-CM | POA: Insufficient documentation

## 2016-04-05 DIAGNOSIS — D72829 Elevated white blood cell count, unspecified: Secondary | ICD-10-CM | POA: Insufficient documentation

## 2016-04-05 LAB — CBC WITH DIFFERENTIAL/PLATELET
Basophils Absolute: 0 cells/uL (ref 0–200)
Basophils Relative: 0 %
Eosinophils Absolute: 290 cells/uL (ref 15–500)
Eosinophils Relative: 2 %
HCT: 41.6 % (ref 35.0–45.0)
Hemoglobin: 14.4 g/dL (ref 11.7–15.5)
Lymphocytes Relative: 16 %
Lymphs Abs: 2320 cells/uL (ref 850–3900)
MCH: 31 pg (ref 27.0–33.0)
MCHC: 34.6 g/dL (ref 32.0–36.0)
MCV: 89.5 fL (ref 80.0–100.0)
MPV: 10.4 fL (ref 7.5–12.5)
Monocytes Absolute: 725 cells/uL (ref 200–950)
Monocytes Relative: 5 %
Neutro Abs: 11165 cells/uL — ABNORMAL HIGH (ref 1500–7800)
Neutrophils Relative %: 77 %
Platelets: 240 10*3/uL (ref 140–400)
RBC: 4.65 MIL/uL (ref 3.80–5.10)
RDW: 13.8 % (ref 11.0–15.0)
WBC: 14.5 10*3/uL — ABNORMAL HIGH (ref 3.8–10.8)

## 2016-04-05 LAB — TSH: TSH: 1.79 mIU/L

## 2016-04-05 MED ORDER — CLINDAMYCIN HCL 300 MG PO CAPS: 300.0000 mg | ORAL_CAPSULE | Freq: Three times a day (TID) | ORAL | Status: DC

## 2016-04-05 NOTE — Telephone Encounter (Signed)
-----   Message from Boykin Nearing, MD sent at 04/05/2016  8:26 AM EDT ----- Normal repeat TSH Elevated WBC with elevated neutrophils on CBC with  Diff, but afebrile but is diabetic and was recently treated with azithromycin for upper respiratory infection. Will cover with clindamycin 300 mg TID for 10 days. Patient advised to return to ED if symptoms worsen, if she develop hoarseness or fever  Otherwise, ENT appt and f/u with PCP as instructed following yesterdays appt

## 2016-04-05 NOTE — Addendum Note (Signed)
Addended by: Boykin Nearing on: 04/05/2016 08:27 AM   Modules accepted: Orders

## 2016-04-05 NOTE — Telephone Encounter (Signed)
Patient information verified per hipaa guidelines.  RN advised patient per Dr. Adrian Blackwater:   Normal repeat TSH      Elevated WBC with elevated neutrophils on CBC with Diff, but afebrile but is diabetic and was recently treated with azithromycin for upper respiratory infection.      Will cover with clindamycin 300 mg TID for 10 days.      Patient advised to return to ED if symptoms worsen, if she develop hoarseness or fever       Otherwise, ENT appt and f/u with PCP as instructed following yesterday's appt

## 2016-04-19 ENCOUNTER — Telehealth: Payer: Self-pay

## 2016-04-19 NOTE — Telephone Encounter (Signed)
Clld pt - LMOVMTC re lab results. 

## 2016-04-19 NOTE — Telephone Encounter (Signed)
-----   Message from Maren Reamer, MD sent at 03/15/2016 11:58 AM EDT ----- Please call w/ results.  Hep c  And HIV screening negative. Cholesterol levels look good, but given hx of diabetes, please take for cardiac and stroke protection.   Your kidney function remains abnormal, but stable, ckd stage 3 due to diabetes, need to tighten dm control to protect kidneys from getting worse.   Your thyroid function is off. I stopped current synthroid 52mcg, and increased dose to 77mcg w/ new prescription, please pick up and take daily.  Hypothyroidism can cause worsening fatigue, etc if not appropriately treated.  Blood count looks good.  Thanks.

## 2016-04-20 NOTE — Progress Notes (Signed)
Two messages left for pt to cll re lab results with no return calls. Will mail lab results.

## 2016-04-24 DIAGNOSIS — H04123 Dry eye syndrome of bilateral lacrimal glands: Secondary | ICD-10-CM | POA: Diagnosis not present

## 2016-04-24 DIAGNOSIS — Z9849 Cataract extraction status, unspecified eye: Secondary | ICD-10-CM | POA: Diagnosis not present

## 2016-04-24 DIAGNOSIS — H18413 Arcus senilis, bilateral: Secondary | ICD-10-CM | POA: Diagnosis not present

## 2016-04-24 DIAGNOSIS — H401122 Primary open-angle glaucoma, left eye, moderate stage: Secondary | ICD-10-CM | POA: Diagnosis not present

## 2016-04-24 DIAGNOSIS — H11423 Conjunctival edema, bilateral: Secondary | ICD-10-CM | POA: Diagnosis not present

## 2016-04-24 DIAGNOSIS — H401111 Primary open-angle glaucoma, right eye, mild stage: Secondary | ICD-10-CM | POA: Diagnosis not present

## 2016-04-24 DIAGNOSIS — Z961 Presence of intraocular lens: Secondary | ICD-10-CM | POA: Diagnosis not present

## 2016-04-24 DIAGNOSIS — H11153 Pinguecula, bilateral: Secondary | ICD-10-CM | POA: Diagnosis not present

## 2016-05-01 DIAGNOSIS — M436 Torticollis: Secondary | ICD-10-CM | POA: Diagnosis not present

## 2016-05-01 DIAGNOSIS — E669 Obesity, unspecified: Secondary | ICD-10-CM | POA: Diagnosis not present

## 2016-05-16 DIAGNOSIS — Z7984 Long term (current) use of oral hypoglycemic drugs: Secondary | ICD-10-CM | POA: Diagnosis not present

## 2016-05-16 DIAGNOSIS — H401132 Primary open-angle glaucoma, bilateral, moderate stage: Secondary | ICD-10-CM | POA: Diagnosis not present

## 2016-05-16 DIAGNOSIS — H11423 Conjunctival edema, bilateral: Secondary | ICD-10-CM | POA: Diagnosis not present

## 2016-05-16 DIAGNOSIS — E119 Type 2 diabetes mellitus without complications: Secondary | ICD-10-CM | POA: Diagnosis not present

## 2016-05-16 DIAGNOSIS — H18413 Arcus senilis, bilateral: Secondary | ICD-10-CM | POA: Diagnosis not present

## 2016-05-16 DIAGNOSIS — Z9849 Cataract extraction status, unspecified eye: Secondary | ICD-10-CM | POA: Diagnosis not present

## 2016-05-16 DIAGNOSIS — H04123 Dry eye syndrome of bilateral lacrimal glands: Secondary | ICD-10-CM | POA: Diagnosis not present

## 2016-05-16 DIAGNOSIS — H47233 Glaucomatous optic atrophy, bilateral: Secondary | ICD-10-CM | POA: Diagnosis not present

## 2016-05-16 DIAGNOSIS — Z961 Presence of intraocular lens: Secondary | ICD-10-CM | POA: Diagnosis not present

## 2016-05-16 DIAGNOSIS — H11153 Pinguecula, bilateral: Secondary | ICD-10-CM | POA: Diagnosis not present

## 2016-05-26 DIAGNOSIS — M1712 Unilateral primary osteoarthritis, left knee: Secondary | ICD-10-CM | POA: Diagnosis not present

## 2016-07-19 ENCOUNTER — Encounter: Payer: Self-pay | Admitting: Internal Medicine

## 2016-07-19 ENCOUNTER — Ambulatory Visit: Payer: Medicare HMO | Attending: Internal Medicine | Admitting: Internal Medicine

## 2016-07-19 VITALS — BP 130/84 | HR 73 | Temp 98.3°F | Resp 16 | Wt 265.2 lb

## 2016-07-19 DIAGNOSIS — Z880 Allergy status to penicillin: Secondary | ICD-10-CM | POA: Insufficient documentation

## 2016-07-19 DIAGNOSIS — E039 Hypothyroidism, unspecified: Secondary | ICD-10-CM | POA: Insufficient documentation

## 2016-07-19 DIAGNOSIS — E1122 Type 2 diabetes mellitus with diabetic chronic kidney disease: Secondary | ICD-10-CM | POA: Diagnosis not present

## 2016-07-19 DIAGNOSIS — Z79899 Other long term (current) drug therapy: Secondary | ICD-10-CM | POA: Insufficient documentation

## 2016-07-19 DIAGNOSIS — N183 Chronic kidney disease, stage 3 unspecified: Secondary | ICD-10-CM

## 2016-07-19 DIAGNOSIS — Z888 Allergy status to other drugs, medicaments and biological substances status: Secondary | ICD-10-CM | POA: Diagnosis not present

## 2016-07-19 DIAGNOSIS — Z23 Encounter for immunization: Secondary | ICD-10-CM | POA: Diagnosis not present

## 2016-07-19 DIAGNOSIS — Z7982 Long term (current) use of aspirin: Secondary | ICD-10-CM | POA: Insufficient documentation

## 2016-07-19 DIAGNOSIS — I10 Essential (primary) hypertension: Secondary | ICD-10-CM

## 2016-07-19 DIAGNOSIS — E119 Type 2 diabetes mellitus without complications: Secondary | ICD-10-CM

## 2016-07-19 DIAGNOSIS — M1990 Unspecified osteoarthritis, unspecified site: Secondary | ICD-10-CM | POA: Insufficient documentation

## 2016-07-19 DIAGNOSIS — I129 Hypertensive chronic kidney disease with stage 1 through stage 4 chronic kidney disease, or unspecified chronic kidney disease: Secondary | ICD-10-CM | POA: Insufficient documentation

## 2016-07-19 DIAGNOSIS — J45909 Unspecified asthma, uncomplicated: Secondary | ICD-10-CM | POA: Insufficient documentation

## 2016-07-19 LAB — BASIC METABOLIC PANEL WITH GFR
BUN: 12 mg/dL (ref 7–25)
CO2: 20 mmol/L (ref 20–31)
Calcium: 9.1 mg/dL (ref 8.6–10.4)
Chloride: 104 mmol/L (ref 98–110)
Creat: 1.29 mg/dL — ABNORMAL HIGH (ref 0.50–0.99)
GFR, Est African American: 51 mL/min — ABNORMAL LOW (ref >=60)
GFR, Est Non African American: 44 mL/min — ABNORMAL LOW (ref >=60)
Glucose, Bld: 188 mg/dL — ABNORMAL HIGH (ref 65–99)
Potassium: 4.2 mmol/L (ref 3.5–5.3)
Sodium: 138 mmol/L (ref 135–146)

## 2016-07-19 LAB — T4, FREE: Free T4: 1 ng/dL (ref 0.8–1.8)

## 2016-07-19 LAB — TSH: TSH: 6.17 mIU/L — ABNORMAL HIGH

## 2016-07-19 LAB — POCT GLYCOSYLATED HEMOGLOBIN (HGB A1C): Hemoglobin A1C: 9.9

## 2016-07-19 LAB — GLUCOSE, POCT (MANUAL RESULT ENTRY): POC Glucose: 195 mg/dl — AB (ref 70–99)

## 2016-07-19 MED ORDER — ASPIRIN 81 MG PO TABS: 81.0000 mg | ORAL_TABLET | Freq: Every day | ORAL | 3 refills | Status: DC

## 2016-07-19 MED ORDER — ALBUTEROL SULFATE 108 (90 BASE) MCG/ACT IN AEPB: 2.0000 | INHALATION_SPRAY | Freq: Four times a day (QID) | RESPIRATORY_TRACT | 3 refills | Status: DC | PRN

## 2016-07-19 MED ORDER — PRAVASTATIN SODIUM 20 MG PO TABS: 20.0000 mg | ORAL_TABLET | Freq: Every day | ORAL | 3 refills | Status: DC

## 2016-07-19 MED ORDER — GLIPIZIDE 10 MG PO TABS: 10.0000 mg | ORAL_TABLET | Freq: Two times a day (BID) | ORAL | 5 refills | Status: DC

## 2016-07-19 MED ORDER — LUBIPROSTONE 24 MCG PO CAPS: 24.0000 ug | ORAL_CAPSULE | Freq: Every day | ORAL | 3 refills | Status: DC

## 2016-07-19 MED ORDER — HYDROCHLOROTHIAZIDE 12.5 MG PO CAPS: 12.5000 mg | ORAL_CAPSULE | Freq: Every day | ORAL | 3 refills | Status: DC

## 2016-07-19 MED ORDER — LISINOPRIL 20 MG PO TABS: 20.0000 mg | ORAL_TABLET | Freq: Every day | ORAL | 3 refills | Status: DC

## 2016-07-19 NOTE — Patient Instructions (Addendum)
QUICK START PATIENT GUIDE TO LCHF/IF LOW CARB HIGH FAT / INTERMITTENT FASTING  Recommend: <50 gram carbohydrate a day for weightloss.  What is this diet and how does it work? o Insulin is a hormone made by your body that allows you to use sugar (glucose) from carbohydrates in the food you eat for energy or to store glucose (as fat) for future use  o Insulin levels need to be lowered in order to utilize our stored energy (fat) o Many struggling with obesity are insulin resistant and have high levels of insulin o This diet works to lower your insulin in two ways o Fasting - allows your insulin levels to naturally decrease  o Avoiding carbohydrates - carbs trigger increase in insulin Low Carb Healthy Fat (LCHF) o Get a free app for your phone, such as MyFitnessPal, to help you track your macronutrients (carbs/protein/fats) and to track your weight and body measurements to see your progress o Set your goal for around 10% carbs/20% protein/70% fat o A good starting goal for amount of net carbs per day is 50 grams (some will aim for 20 grams) o "Net carbs" refers to total grams of carbs minus grams of fiber (as fiber is not typically absorbed). For example, if a food has 5g total carb and 3g fiber, that would be 2g net carbs o Increase healthy fats - eg. olive oil, eggs, nuts, avocado, cheese, butter, coconut, meats, fish o Avoid high carb foods - eg. bread, pasta, potatoes, rice, cookies, soda, juice, anything sugary o Buy full-fat ingredients (avoid low-fat versions, which often have more sugar) o No need to count calories, but pay close attention to grams of carbs on labels Intermittent Fasting (IF) o "Fasting" is going a period of time without eating - it helps to stay busy and well-hydrated o Purpose of fasting is to allow insulin levels to drop as low as possible, allowing your body to switch into fat-burning mode o With this diet there are many approaches to fasting, but 16:8 and 24hr fasts  are commonly used o 16:8 fast, usually 5-7 days a week - Fasting for 16 hours of the day, then eating all meals for the day over course of 8 hours. o 24 hour fast, usually 1-3 days a week - Typically eating one meal a day, then fasting until the next day. Plenty of fluids (and some salt to help you hold onto fluids) are recommended during longer fasts.  o During fasts certain beverages are still acceptable - water, sparkling water, bone broth, black tea or coffee, or tea/coffee with small amount of heavy whipping cream Special note for those on diabetic medications o Discuss your medications with your physician. You may need to hold your medication or adjust to only taking when eating. Diabetics should keep close track of their blood sugars when making any changes to diet/meds, to ensure they are staying within normal limits For more info about LCHF/IF o Watch "Therapeutic Fasting - Solving the Two-Compartment Problem" video by Dr. Jason Fung on YouTube (https://www.youtube.com/watch?v=tIuj-oMN-Fk) for a great intro to these concepts o Read "The Obesity Code" and/or "The Complete Guide to Fasting" by Dr. Jason Fung o Go to www.dietdoctor.com for explanations, recipes, and infographics about foods to eat/avoid o Get a Free smartphone app that helps count carbohydrates  - ie MyKeto EXAMPLES TO GET STARTED Fasting Beverages -water (can add  tsp Pink Himalayan salt once or twice a day to help stay hydrated for longer fasts) -Sparkling water (such as   [employer] or similar; avoid any with artificial sweeteners)  -Bone broth (multiple recipes available online or can buy pre-made) -Tea or Coffee (Adding heavy whipping cream or coconut oil to your tea or coffee can be helpful if you find yourself getting too hungry during the fasts. Can also add cinnamon for flavor. Or "bulletproof coffee.") Low Carb Healthy Fat Breakfast (if not fasting) -eggs in butter or olive oil with avocado -omelet with veggies and  cheese  Lunch -hamburger with cheese and avocado wrapped in lettuce (no bun, no ketchup) -meat and cheese wrapped in lettuce (can dip in mustard or olive oil/vinegar/mayo) -salad with meat/cheese/nuts and higher fat dressing (vinaigrette or Ranch, etc) -tuna salad lettuce wrap -taco meat with cheese, sour cream, guacamole, cheese over lettuce  Dinner -steak with herb butter or Barnaise sauce -"Fathead" pizza (uses cheese and almond flour for the dough - several recipes available online) -roasted or grilled chicken with skin on, with low carb sauce (buffalo, garlic butter, alfredo, pesto, etc) -baked salmon with lemon butter -chicken alfredo with zucchini noodles -[place] butter chicken with low carb garlic naan -egg roll in a bowl  Side Dishes -mashed cauliflower (homemade or available in freezer section) -roast vegetables (green veggies that grow above ground rather than root veggies) with butter or cheese -Caprese salad (fresh mozzarella, tomato and basil with olive oil) -homemade low-carb coleslaw Snacks/Desserts (try to avoid unnecessary snacking and sweets in general) -celery or cucumber dipped in guacamole or sour cream dip -cheese and meat slices  -raspberries with whipped cream (can make homemade with no sugar added) -low carb [place] butter cake  AVOID - sugar, diet/regular soda, potatoes, breads, rice, pasta, candy, cookies, cakes, muffins, juice, high carb fruit (bananas, grapes), beer, ketchup, barbeque and other sweet sauces  Influenza Virus Vaccine injection (Fluarix) What is this medicine? INFLUENZA VIRUS VACCINE (in floo EN zuh VAHY ruhs vak SEEN) helps to reduce the risk of getting influenza also known as the flu. This medicine may be used for other purposes; ask your health care provider or pharmacist if you have questions. What should I tell my health care provider before I take this medicine? They need to know if you have any of these conditions: -bleeding  disorder like hemophilia -fever or infection -Guillain-Barre syndrome or other neurological problems -immune system problems -infection with the human immunodeficiency virus (HIV) or AIDS -low blood platelet counts -multiple sclerosis -an unusual or allergic reaction to influenza virus vaccine, eggs, chicken proteins, latex, gentamicin, other medicines, foods, dyes or preservatives -pregnant or trying to get pregnant -breast-feeding How should I use this medicine? This vaccine is for injection into a muscle. It is given by a health care professional. A copy of Vaccine Information Statements will be given before each vaccination. Read this sheet carefully each time. The sheet may change frequently. Talk to your pediatrician regarding the use of this medicine in children. Special care may be needed. Overdosage: If you think you have taken too much of this medicine contact a poison control center or emergency room at once. NOTE: This medicine is only for you. Do not share this medicine with others. What if I miss a dose? This does not apply. What may interact with this medicine? -chemotherapy or radiation therapy -medicines that lower your immune system like etanercept, anakinra, infliximab, and adalimumab -medicines that treat or prevent blood clots like warfarin -phenytoin -steroid medicines like prednisone or cortisone -theophylline -vaccines This list may not describe all possible interactions. Give your health care provider  a list of all the medicines, herbs, non-prescription drugs, or dietary supplements you use. Also tell them if you smoke, drink alcohol, or use illegal drugs. Some items may interact with your medicine. What should I watch for while using this medicine? Report any side effects that do not go away within 3 days to your doctor or health care professional. Call your health care provider if any unusual symptoms occur within 6 weeks of receiving this vaccine. You may  still catch the flu, but the illness is not usually as bad. You cannot get the flu from the vaccine. The vaccine will not protect against colds or other illnesses that may cause fever. The vaccine is needed every year. What side effects may I notice from receiving this medicine? Side effects that you should report to your doctor or health care professional as soon as possible: -allergic reactions like skin rash, itching or hives, swelling of the face, lips, or tongue Side effects that usually do not require medical attention (report to your doctor or health care professional if they continue or are bothersome): -fever -headache -muscle aches and pains -pain, tenderness, redness, or swelling at site where injected -weak or tired This list may not describe all possible side effects. Call your doctor for medical advice about side effects. You may report side effects to FDA at 1-800-FDA-1088. Where should I keep my medicine? This vaccine is only given in a clinic, pharmacy, doctor's office, or other health care setting and will not be stored at home. NOTE: This sheet is a summary. It may not cover all possible information. If you have questions about this medicine, talk to your doctor, pharmacist, or health care provider.    2016, Elsevier/Gold Standard. (2008-04-01 09:30:40)  -  Diabetes and Exercise Exercising regularly is important. It is not just about losing weight. It has many health benefits, such as:  Improving your overall fitness, flexibility, and endurance.  Increasing your bone density.  Helping with weight control.  Decreasing your body fat.  Increasing your muscle strength.  Reducing stress and tension.  Improving your overall health. People with diabetes who exercise gain additional benefits because exercise:  Reduces appetite.  Improves the body's use of blood sugar (glucose).  Helps lower or control blood glucose.  Decreases blood pressure.  Helps control  blood lipids (such as cholesterol and triglycerides).  Improves the body's use of the hormone insulin by:  Increasing the body's insulin sensitivity.  Reducing the body's insulin needs.  Decreases the risk for heart disease because exercising:  Lowers cholesterol and triglycerides levels.  Increases the levels of good cholesterol (such as high-density lipoproteins [HDL]) in the body.  Lowers blood glucose levels. YOUR ACTIVITY PLAN  Choose an activity that you enjoy, and set realistic goals. To exercise safely, you should begin practicing any new physical activity slowly, and gradually increase the intensity of the exercise over time. Your health care provider or diabetes educator can help create an activity plan that works for you. General recommendations include:  Encouraging children to engage in at least 60 minutes of physical activity each day.  Stretching and performing strength training exercises, such as yoga or weight lifting, at least 2 times per week.  Performing a total of at least 150 minutes of moderate-intensity exercise each week, such as brisk walking or water aerobics.  Exercising at least 3 days per week, making sure you allow no more than 2 consecutive days to pass without exercising.  Avoiding long periods of inactivity (90  minutes or more). When you have to spend an extended period of time sitting down, take frequent breaks to walk or stretch. RECOMMENDATIONS FOR EXERCISING WITH TYPE 1 OR TYPE 2 DIABETES   Check your blood glucose before exercising. If blood glucose levels are greater than 240 mg/dL, check for urine ketones. Do not exercise if ketones are present.  Avoid injecting insulin into areas of the body that are going to be exercised. For example, avoid injecting insulin into:  The arms when playing tennis.  The legs when jogging.  Keep a record of:  Food intake before and after you exercise.  Expected peak times of insulin action.  Blood  glucose levels before and after you exercise.  The type and amount of exercise you have done.  Review your records with your health care provider. Your health care provider will help you to develop guidelines for adjusting food intake and insulin amounts before and after exercising.  If you take insulin or oral hypoglycemic agents, watch for signs and symptoms of hypoglycemia. They include:  Dizziness.  Shaking.  Sweating.  Chills.  Confusion.  Drink plenty of water while you exercise to prevent dehydration or heat stroke. Body water is lost during exercise and must be replaced.  Talk to your health care provider before starting an exercise program to make sure it is safe for you. Remember, almost any type of activity is better than none.   This information is not intended to replace advice given to you by your health care provider. Make sure you discuss any questions you have with your health care provider.   Document Released: 11/25/2003 Document Revised: 01/19/2015 Document Reviewed: 02/11/2013 Elsevier Interactive Patient Education [employer].
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
## 2016-07-19 NOTE — Progress Notes (Signed)
Pt is in the office today for a medicare visit Pt states she  is not in any pain Pt states she is taking medication without diffictly

## 2016-07-22 ENCOUNTER — Other Ambulatory Visit: Payer: Self-pay | Admitting: Internal Medicine

## 2016-07-22 MED ORDER — LEVOTHYROXINE SODIUM 100 MCG PO TABS: 100.0000 ug | ORAL_TABLET | Freq: Every day | ORAL | 3 refills | Status: DC

## 2016-07-25 DIAGNOSIS — H47233 Glaucomatous optic atrophy, bilateral: Secondary | ICD-10-CM | POA: Diagnosis not present

## 2016-07-25 DIAGNOSIS — H401132 Primary open-angle glaucoma, bilateral, moderate stage: Secondary | ICD-10-CM | POA: Diagnosis not present

## 2016-07-25 DIAGNOSIS — E119 Type 2 diabetes mellitus without complications: Secondary | ICD-10-CM | POA: Diagnosis not present

## 2016-07-25 DIAGNOSIS — Z7984 Long term (current) use of oral hypoglycemic drugs: Secondary | ICD-10-CM | POA: Diagnosis not present

## 2016-07-26 ENCOUNTER — Telehealth: Payer: Self-pay

## 2016-07-26 NOTE — Telephone Encounter (Signed)
Contacted pt to go over lab results pt is aware and doesn't have any questions or concerns 

## 2016-08-08 ENCOUNTER — Telehealth: Payer: Self-pay | Admitting: Internal Medicine

## 2016-08-08 MED ORDER — TRUE METRIX GO GLUCOSE METER W/DEVICE KIT: 1.0000 | PACK | Freq: Three times a day (TID) | 0 refills | Status: DC | PRN

## 2016-08-08 MED ORDER — GLUCOSE BLOOD VI STRP: ORAL_STRIP | 12 refills | Status: DC

## 2016-08-08 MED ORDER — TRUEPLUS LANCETS 26G MISC: 1.0000 | Freq: Three times a day (TID) | 12 refills | Status: DC | PRN

## 2016-08-08 NOTE — Telephone Encounter (Signed)
Will forward to pcp

## 2016-08-08 NOTE — Telephone Encounter (Signed)
I sent rx to [facility], not certain if she has insurance, so I did trumetrix machine. But may need different one. What ever [facility] covers is ok by me if you need to change my order.

## 2016-08-08 NOTE — Telephone Encounter (Signed)
Pt brother is aware of rx sent to pharmacy and will make pt aware

## 2016-08-08 NOTE — Telephone Encounter (Signed)
Patient would like for PCP to prescribe a glucose monitor. Please follow up.   Thank you.

## 2016-08-09 ENCOUNTER — Other Ambulatory Visit: Payer: Self-pay | Admitting: Pharmacist

## 2016-08-09 MED ORDER — ACCU-CHEK AVIVA PLUS W/DEVICE KIT: PACK | 0 refills | Status: DC

## 2016-08-09 MED ORDER — ACCU-CHEK SOFTCLIX LANCETS MISC: 12 refills | Status: DC

## 2016-08-09 MED ORDER — ACCU-CHEK SOFTCLIX LANCET DEV MISC: 0 refills | Status: AC

## 2016-08-09 MED ORDER — GLUCOSE BLOOD VI STRP: ORAL_STRIP | 12 refills | Status: DC

## 2016-08-24 DIAGNOSIS — Z0100 Encounter for examination of eyes and vision without abnormal findings: Secondary | ICD-10-CM | POA: Diagnosis not present

## 2016-09-25 DIAGNOSIS — R6889 Other general symptoms and signs: Secondary | ICD-10-CM | POA: Diagnosis not present

## 2016-09-25 DIAGNOSIS — M1712 Unilateral primary osteoarthritis, left knee: Secondary | ICD-10-CM | POA: Diagnosis not present

## 2016-09-27 ENCOUNTER — Other Ambulatory Visit: Payer: Self-pay | Admitting: Internal Medicine

## 2016-09-27 DIAGNOSIS — Z1231 Encounter for screening mammogram for malignant neoplasm of breast: Secondary | ICD-10-CM

## 2016-09-30 IMAGING — US US EXTREM LOW VENOUS*L*
1 series · 14 of 24 positions shown · non-contrast
Comparison: None.

CLINICAL DATA: Left leg pain

EXAM:
LEFT LOWER EXTREMITY VENOUS DUPLEX ULTRASOUND
TECHNIQUE: Doppler venous assessment of the left lower extremity deep venous
system was performed, including characterization of spectral flow,
compressibility, and phasicity.

[Series 1: us extrem low venous*left* · 14 of 32 slices shown]
[im 1/32]
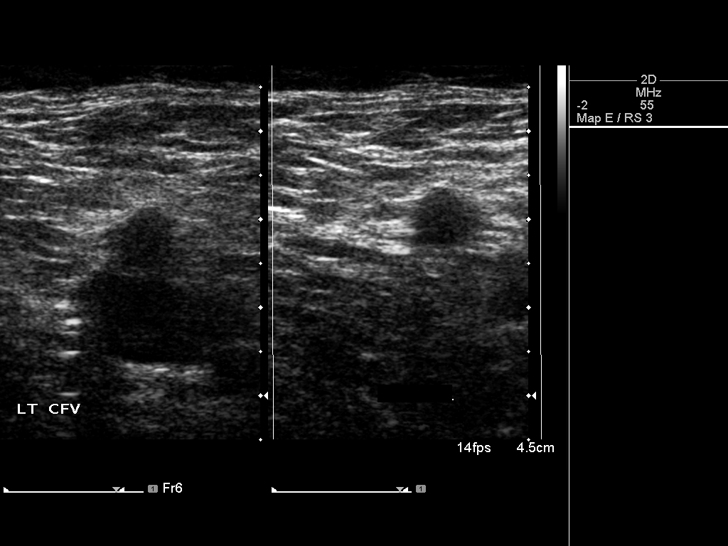
[im 3/32]
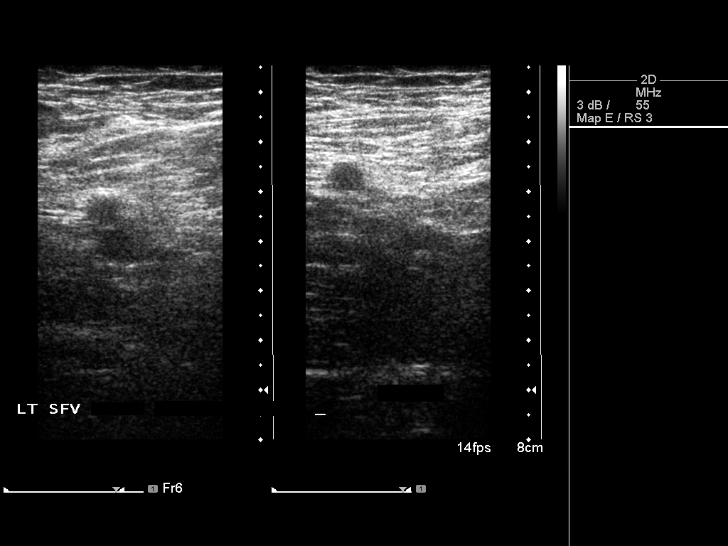
[im 6/32]
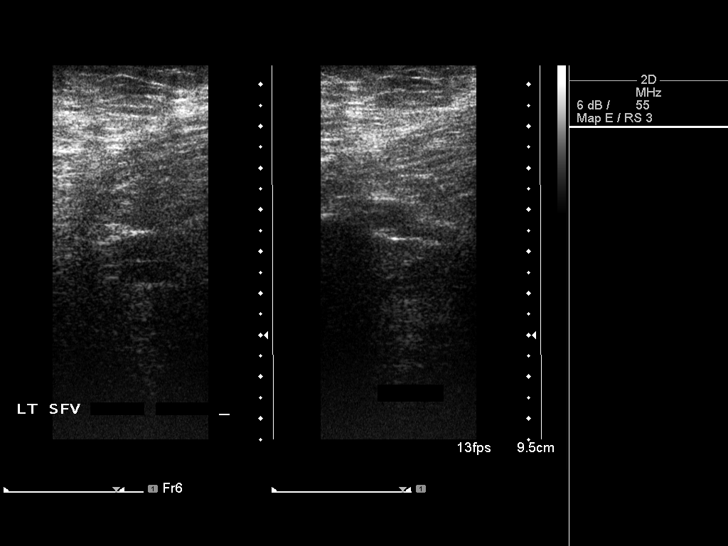
[im 9/32]
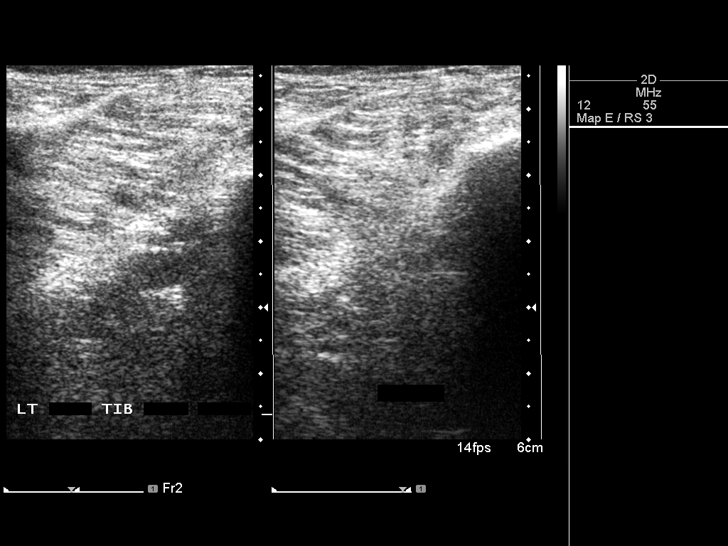
[im 10/32]
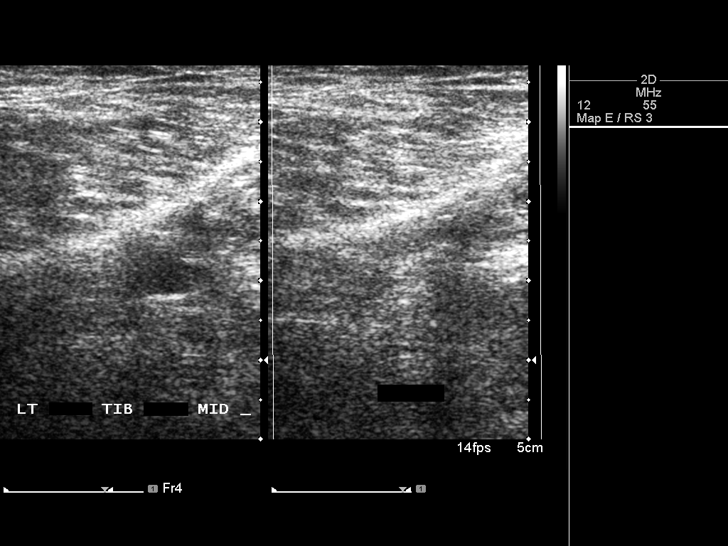
[im 13/32]
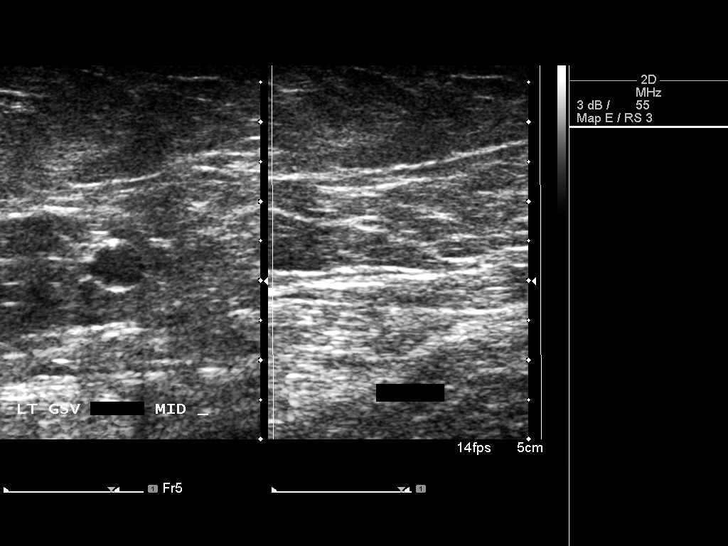
[im 15/32]
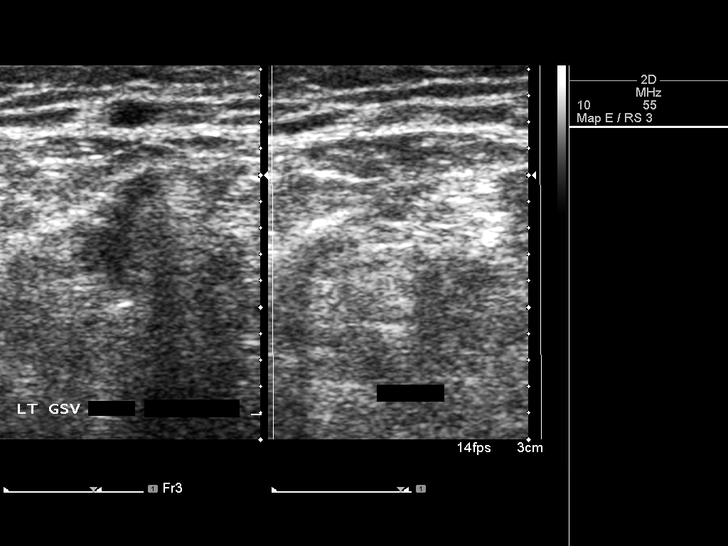
[im 17/32]
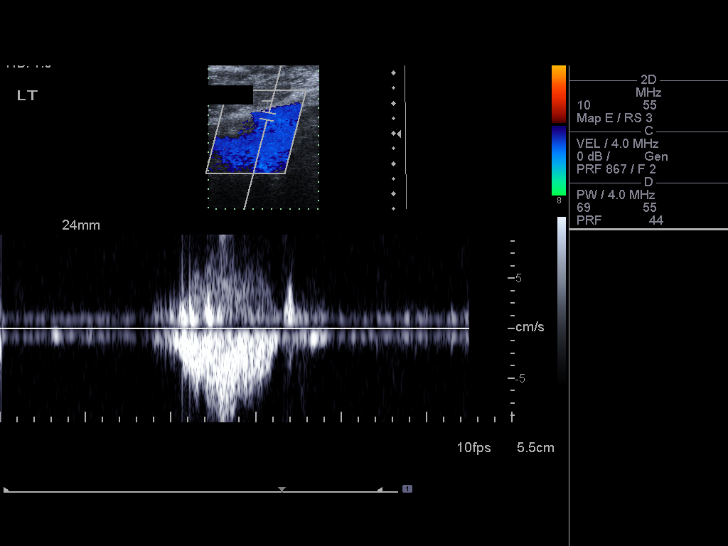
[im 19/32]
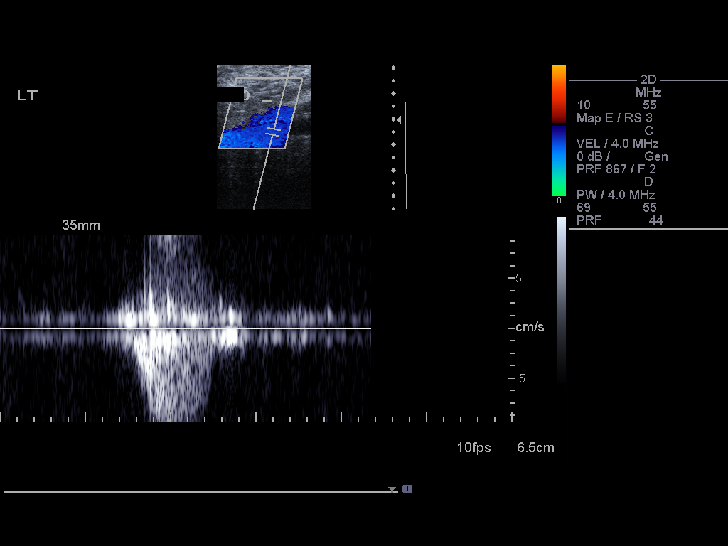
[im 22/32]
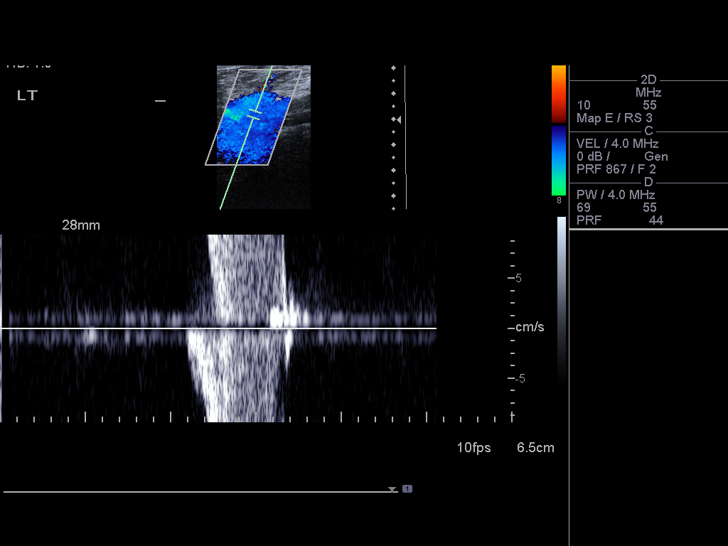
[im 25/32]
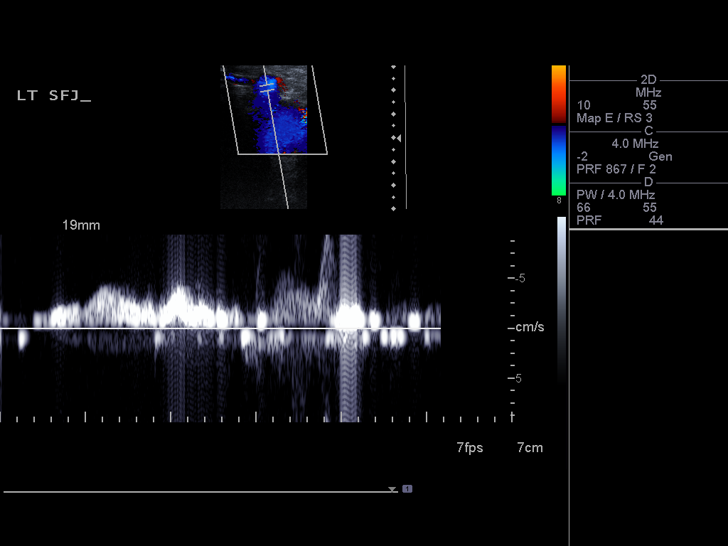
[im 26/32]
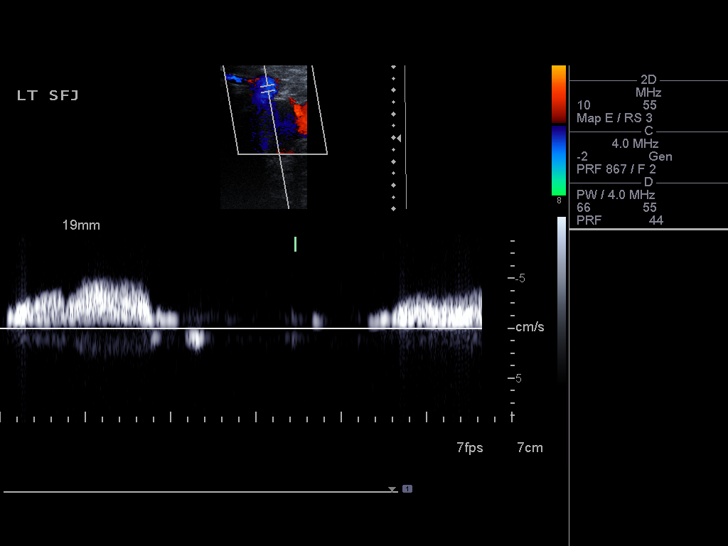
[im 29/32]
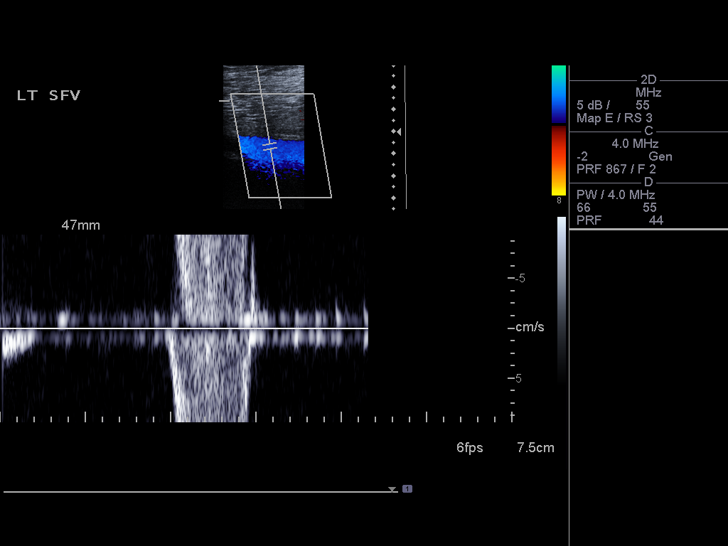
[im 32/32]
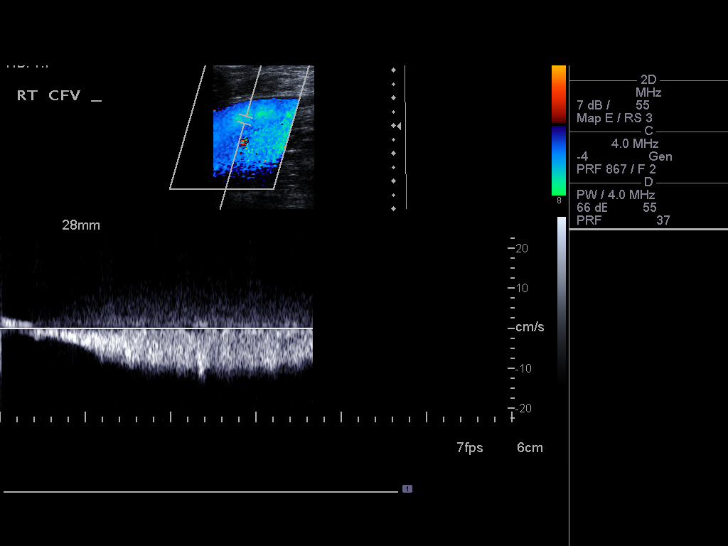

[14 of 24 positions shown; findings below may reference images not displayed]

FINDINGS: There is complete compressibility of the left common femoral,
femoral, and popliteal veins. Doppler analysis demonstrates
respiratory phasicity and augmentation of flow with calf
compression.
IMPRESSION: No evidence of left lower extremity DVT.

## 2016-10-30 DIAGNOSIS — M1712 Unilateral primary osteoarthritis, left knee: Secondary | ICD-10-CM | POA: Diagnosis not present

## 2016-10-30 DIAGNOSIS — R6889 Other general symptoms and signs: Secondary | ICD-10-CM | POA: Diagnosis not present

## 2016-11-01 ENCOUNTER — Ambulatory Visit
Admission: RE | Admit: 2016-11-01 | Discharge: 2016-11-01 | Disposition: A | Discharge status: Home / Self Care | Payer: Medicare HMO | Source: Ambulatory Visit | Attending: Internal Medicine | Admitting: Internal Medicine

## 2016-11-01 DIAGNOSIS — Z1231 Encounter for screening mammogram for malignant neoplasm of breast: Secondary | ICD-10-CM | POA: Diagnosis not present

## 2016-11-01 DIAGNOSIS — R6889 Other general symptoms and signs: Secondary | ICD-10-CM | POA: Diagnosis not present

## 2016-11-09 ENCOUNTER — Telehealth: Payer: Self-pay

## 2016-11-09 NOTE — Telephone Encounter (Signed)
Contacted pt to go over mm results pt is aware of results and doesn't have any questions or concerns

## 2016-11-20 DIAGNOSIS — H47233 Glaucomatous optic atrophy, bilateral: Secondary | ICD-10-CM | POA: Diagnosis not present

## 2016-11-20 DIAGNOSIS — Z961 Presence of intraocular lens: Secondary | ICD-10-CM | POA: Diagnosis not present

## 2016-11-20 DIAGNOSIS — Z9849 Cataract extraction status, unspecified eye: Secondary | ICD-10-CM | POA: Diagnosis not present

## 2016-11-20 DIAGNOSIS — H11153 Pinguecula, bilateral: Secondary | ICD-10-CM | POA: Diagnosis not present

## 2016-11-20 DIAGNOSIS — H04123 Dry eye syndrome of bilateral lacrimal glands: Secondary | ICD-10-CM | POA: Diagnosis not present

## 2016-11-20 DIAGNOSIS — H401122 Primary open-angle glaucoma, left eye, moderate stage: Secondary | ICD-10-CM | POA: Diagnosis not present

## 2016-11-20 DIAGNOSIS — R6889 Other general symptoms and signs: Secondary | ICD-10-CM | POA: Diagnosis not present

## 2016-11-20 DIAGNOSIS — H401111 Primary open-angle glaucoma, right eye, mild stage: Secondary | ICD-10-CM | POA: Diagnosis not present

## 2016-11-20 DIAGNOSIS — H11423 Conjunctival edema, bilateral: Secondary | ICD-10-CM | POA: Diagnosis not present

## 2016-11-20 DIAGNOSIS — E119 Type 2 diabetes mellitus without complications: Secondary | ICD-10-CM | POA: Diagnosis not present

## 2016-11-22 DIAGNOSIS — R6889 Other general symptoms and signs: Secondary | ICD-10-CM | POA: Diagnosis not present

## 2017-01-03 ENCOUNTER — Ambulatory Visit (HOSPITAL_COMMUNITY)
Admission: EM | Admit: 2017-01-03 | Discharge: 2017-01-03 | Disposition: A | Discharge status: Home / Self Care | Payer: Medicare HMO | Attending: Family Medicine | Admitting: Family Medicine

## 2017-01-03 ENCOUNTER — Encounter (HOSPITAL_COMMUNITY): Payer: Self-pay | Admitting: Emergency Medicine

## 2017-01-03 DIAGNOSIS — S161XXA Strain of muscle, fascia and tendon at neck level, initial encounter: Secondary | ICD-10-CM

## 2017-01-03 DIAGNOSIS — M542 Cervicalgia: Secondary | ICD-10-CM

## 2017-01-03 MED ORDER — ACETAMINOPHEN 325 MG PO TABS
650.0000 mg | ORAL_TABLET | Freq: Once | ORAL | Status: AC
  Administered 2017-01-03: 650 mg via ORAL

## 2017-01-03 MED ORDER — ACETAMINOPHEN 325 MG PO TABS
ORAL_TABLET | ORAL | Status: AC
  Filled 2017-01-03: qty 2

## 2017-01-03 MED ORDER — IBUPROFEN 800 MG PO TABS
400.0000 mg | ORAL_TABLET | Freq: Once | ORAL | Status: AC
  Administered 2017-01-03: 400 mg via ORAL

## 2017-01-03 MED ORDER — IBUPROFEN 800 MG PO TABS
ORAL_TABLET | ORAL | Status: AC
  Filled 2017-01-03: qty 1

## 2017-01-03 MED ORDER — NAPROXEN 250 MG PO TABS
250.0000 mg | ORAL_TABLET | Freq: Two times a day (BID) | ORAL | 0 refills | Status: DC
Start: 2017-01-03 — End: 2017-03-22

## 2017-01-03 MED ORDER — DICLOFENAC SODIUM 1 % TD GEL: 1.0000 "application " | Freq: Four times a day (QID) | TRANSDERMAL | 0 refills | Status: DC
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
## 2017-01-03 NOTE — Discharge Instructions (Signed)
You Have a strain in the muscles around the left side of your neck. Heat and stretches will help this. Apply the diclofenac gel to the area of soreness 4 times a day and apply heat such as thermic care heat wraps that last about 8 hours. Also take the Naprosyn twice a day for pain. Perform the stretches as demonstrated. Do this slowly and gradually. He may have pain for several more days but should gradually get better.
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
## 2017-01-30 DIAGNOSIS — M1712 Unilateral primary osteoarthritis, left knee: Secondary | ICD-10-CM | POA: Diagnosis not present

## 2017-01-30 DIAGNOSIS — M545 Low back pain: Secondary | ICD-10-CM | POA: Diagnosis not present

## 2017-01-30 DIAGNOSIS — M25562 Pain in left knee: Secondary | ICD-10-CM | POA: Diagnosis not present

## 2017-02-01 ENCOUNTER — Encounter: Payer: Self-pay | Admitting: Internal Medicine

## 2017-02-02 ENCOUNTER — Encounter: Payer: Self-pay | Admitting: Internal Medicine

## 2017-02-05 ENCOUNTER — Emergency Department (HOSPITAL_COMMUNITY)
Admission: EM | Admit: 2017-02-05 | Discharge: 2017-02-05 | Disposition: A | Discharge status: Home / Self Care | Payer: Medicare HMO | Attending: Emergency Medicine | Admitting: Emergency Medicine

## 2017-02-05 ENCOUNTER — Encounter (HOSPITAL_COMMUNITY): Payer: Self-pay | Admitting: Emergency Medicine

## 2017-02-05 ENCOUNTER — Encounter: Payer: Self-pay | Admitting: Internal Medicine

## 2017-02-05 DIAGNOSIS — Z7984 Long term (current) use of oral hypoglycemic drugs: Secondary | ICD-10-CM | POA: Diagnosis not present

## 2017-02-05 DIAGNOSIS — I129 Hypertensive chronic kidney disease with stage 1 through stage 4 chronic kidney disease, or unspecified chronic kidney disease: Secondary | ICD-10-CM | POA: Diagnosis not present

## 2017-02-05 DIAGNOSIS — Z96651 Presence of right artificial knee joint: Secondary | ICD-10-CM | POA: Diagnosis not present

## 2017-02-05 DIAGNOSIS — Z87891 Personal history of nicotine dependence: Secondary | ICD-10-CM | POA: Diagnosis not present

## 2017-02-05 DIAGNOSIS — N183 Chronic kidney disease, stage 3 (moderate): Secondary | ICD-10-CM | POA: Insufficient documentation

## 2017-02-05 DIAGNOSIS — R112 Nausea with vomiting, unspecified: Secondary | ICD-10-CM | POA: Insufficient documentation

## 2017-02-05 DIAGNOSIS — Z91040 Latex allergy status: Secondary | ICD-10-CM | POA: Diagnosis not present

## 2017-02-05 DIAGNOSIS — R1084 Generalized abdominal pain: Secondary | ICD-10-CM | POA: Diagnosis present

## 2017-02-05 DIAGNOSIS — Z7982 Long term (current) use of aspirin: Secondary | ICD-10-CM | POA: Insufficient documentation

## 2017-02-05 DIAGNOSIS — E1122 Type 2 diabetes mellitus with diabetic chronic kidney disease: Secondary | ICD-10-CM | POA: Diagnosis not present

## 2017-02-05 DIAGNOSIS — R197 Diarrhea, unspecified: Secondary | ICD-10-CM | POA: Insufficient documentation

## 2017-02-05 LAB — URINALYSIS, ROUTINE W REFLEX MICROSCOPIC
Bilirubin Urine: NEGATIVE
Glucose, UA: >=500 mg/dL — AB
Hgb urine dipstick: NEGATIVE
Ketones, ur: 20 mg/dL — AB
Leukocytes, UA: NEGATIVE
Nitrite: NEGATIVE
Protein, ur: NEGATIVE mg/dL
Specific Gravity, Urine: 1.012 (ref 1.005–1.030)
pH: 7 (ref 5.0–8.0)

## 2017-02-05 LAB — CBC
HCT: 39.7 % (ref 36.0–46.0)
Hemoglobin: 13.7 g/dL (ref 12.0–15.0)
MCH: 30.6 pg (ref 26.0–34.0)
MCHC: 34.5 g/dL (ref 30.0–36.0)
MCV: 88.6 fL (ref 78.0–100.0)
Platelets: 275 10*3/uL (ref 150–400)
RBC: 4.48 MIL/uL (ref 3.87–5.11)
RDW: 12.8 % (ref 11.5–15.5)
WBC: 14.1 10*3/uL — ABNORMAL HIGH (ref 4.0–10.5)

## 2017-02-05 LAB — COMPREHENSIVE METABOLIC PANEL
ALT: 14 U/L (ref 14–54)
AST: 16 U/L (ref 15–41)
Albumin: 3.4 g/dL — ABNORMAL LOW (ref 3.5–5.0)
Alkaline Phosphatase: 109 U/L (ref 38–126)
Anion gap: 11 (ref 5–15)
BUN: 17 mg/dL (ref 6–20)
CO2: 23 mmol/L (ref 22–32)
Calcium: 8.9 mg/dL (ref 8.9–10.3)
Chloride: 101 mmol/L (ref 101–111)
Creatinine, Ser: 1.08 mg/dL — ABNORMAL HIGH (ref 0.44–1.00)
GFR calc Af Amer: >60 mL/min (ref >60)
GFR calc non Af Amer: 54 mL/min — ABNORMAL LOW (ref >60)
Glucose, Bld: 239 mg/dL — ABNORMAL HIGH (ref 65–99)
Potassium: 3.6 mmol/L (ref 3.5–5.1)
Sodium: 135 mmol/L (ref 135–145)
Total Bilirubin: 0.7 mg/dL (ref 0.3–1.2)
Total Protein: 7.5 g/dL (ref 6.5–8.1)

## 2017-02-05 LAB — LIPASE, BLOOD: Lipase: 33 U/L (ref 11–51)

## 2017-02-05 MED ORDER — ONDANSETRON 4 MG PO TBDP
4.0000 mg | ORAL_TABLET | Freq: Three times a day (TID) | ORAL | 0 refills | Status: DC | PRN
Start: 2017-02-05 — End: 2017-03-22

## 2017-02-05 MED ORDER — ONDANSETRON HCL 4 MG/2ML IJ SOLN
4.0000 mg | Freq: Once | INTRAMUSCULAR | Status: AC
  Administered 2017-02-05: 4 mg via INTRAVENOUS
  Filled 2017-02-05: qty 2

## 2017-02-05 MED ORDER — SODIUM CHLORIDE 0.9 % IV BOLUS (SEPSIS)
1000.0000 mL | Freq: Once | INTRAVENOUS | Status: AC
Start: 2017-02-05 — End: 2017-02-05
  Administered 2017-02-05: 1000 mL via INTRAVENOUS

## 2017-02-05 MED ORDER — KETOROLAC TROMETHAMINE 30 MG/ML IJ SOLN
30.0000 mg | Freq: Once | INTRAMUSCULAR | Status: AC
  Administered 2017-02-05: 30 mg via INTRAVENOUS
  Filled 2017-02-05: qty 1

## 2017-02-05 NOTE — ED Triage Notes (Signed)
Pt c/o generalized abdominal pain with N/V onset 2-3 hours PTA. Pt reports that she ate store brought rotisserie chicken, homemade macaroni and cheese and corn with onions. Pt brother had same meal but is not experiencing any symptoms.

## 2017-02-05 NOTE — ED Provider Notes (Signed)
[place] DEPT Provider Note   CSN: [id number] Arrival date & time: 02/05/17  0541     History   Chief Complaint Chief Complaint  Patient presents with  . Abdominal Pain  . Emesis    HPI Kayla Bean is a 61 y.o. female.  The history is provided by the patient and medical records.    61 year old female with history of arthritis, asthma, diabetes, hypertension, hypothyroidism, presenting to the ED for abdominal pain. Patient reports this began around 3:30 AM this morning. States pain is generalized, cramping in nature. States it feels like "hunger pains". She reports 3 episodes of emesis since onset as well as 2 episodes of loose, watery diarrhea. She denies any fever or chills. States she did eat some urges rechecking, macaroni, and core and onions last evening for dinner. Her brother ate the same food but is not having any issues.  She denies any recent GI issues. Prior abdominal surgeries include hysterectomy.  She has not tried any medications for her symptoms.  Patient Active Problem List   Diagnosis Date Noted  . Elevated WBC count 04/05/2016  . Abnormal CT scan, neck 04/05/2016  . Neck stiffness 04/04/2016  . Abdominal pain, chronic, right lower quadrant 05/23/2013  . Lymphadenopathy 05/23/2013  . CKD stage 3 due to type 2 diabetes mellitus ([place]) 03/19/2013  . Cholelithiasis 12/24/2012  . LIPOMA 07/26/2010  . SKIN TAG 11/19/2009  . SEBACEOUS CYST 11/19/2009  . CONSTIPATION, CHRONIC 01/18/2009  . EUSTACHIAN TUBE DYSFUNCTION, RIGHT 10/16/2008  . LOM 09/01/2008  . Essential hypertension 08/18/2008  . OSTEOARTHRITIS, KNEE, LEFT 04/23/2008  . PLANTAR FASCIITIS, RIGHT 04/23/2008  . HAIR LOSS 12/16/2007  . OTHER ABNORMAL BLOOD CHEMISTRY 12/16/2007  . Hypothyroidism 10/15/2007  . Diabetes type 2, uncontrolled ([place]) 09/04/2007  . ASTHMA 09/04/2007  . ARTHROSCOPY, RIGHT KNEE, HX OF 04/17/2006    Past Surgical History:  Procedure Laterality Date  . ABDOMINAL  HYSTERECTOMY    . KNEE SURGERY     left  . THYROIDECTOMY    . TOTAL KNEE ARTHROPLASTY     right knee    OB History    No data available       Home Medications    Prior to Admission medications   Medication Sig Start Date End Date Taking? Authorizing Provider  Albuterol Sulfate (PROAIR RESPICLICK) 935 (90 Base) MCG/ACT AEPB Inhale 2 puffs into the lungs every 6 (six) hours as needed. Patient taking differently: Inhale 2 puffs into the lungs every 6 (six) hours as needed (for breathing).  07/19/16  Yes Maren Reamer, MD  aspirin 81 MG tablet Take 1 tablet (81 mg total) by mouth daily. 07/19/16  Yes Langeland, Dawn T, MD  brimonidine-timolol (COMBIGAN) 0.2-0.5 % ophthalmic solution Place 1 drop into both eyes every 12 (twelve) hours.   Yes [provider]  glipiZIDE (GLUCOTROL) 10 MG tablet Take 1 tablet (10 mg total) by mouth 2 (two) times daily before a meal. 07/19/16  Yes Langeland, Dawn T, MD  hydrochlorothiazide (MICROZIDE) 12.5 MG capsule Take 1 capsule (12.5 mg total) by mouth daily. Must have office visit for refills 07/19/16  Yes Langeland, Dawn T, MD  levothyroxine (SYNTHROID, LEVOTHROID) 100 MCG tablet Take 1 tablet (100 mcg total) by mouth daily. 07/22/16  Yes Langeland, Dawn T, MD  lisinopril (PRINIVIL,ZESTRIL) 20 MG tablet Take 1 tablet (20 mg total) by mouth daily. 07/19/16  Yes Langeland, Dawn T, MD  lubiprostone (AMITIZA) 24 MCG capsule Take 1 capsule (24 mcg total) by  mouth daily with breakfast. 07/19/16  Yes Langeland, Leda Quail, MD  ACCU-CHEK SOFTCLIX LANCETS lancets Use as instructed for 3 times daily testing of blood glucose 08/09/16   Langeland, Dawn T, MD  Blood Glucose Monitoring Suppl (ACCU-CHEK AVIVA PLUS) w/Device KIT Use as directed 3 times daily for blood glucose monitoring 08/09/16   Lottie Mussel T, MD  diclofenac sodium (VOLTAREN) 1 % GEL Apply 1 application topically 4 (four) times daily. Patient not taking: Reported on 02/05/2017 01/03/17   Janne Napoleon, NP  glucose blood (ACCU-CHEK AVIVA PLUS) test strip Use as instructed for 3 times daily testing of blood glucose 08/09/16   Lottie Mussel T, MD  Lancet Devices [facility]) lancets Use as instructed for 3 times daily testing of blood glucose 08/09/16   Langeland, Dawn T, MD  loratadine (CLARITIN) 10 MG tablet Take 1 tablet (10 mg total) by mouth daily. Patient not taking: Reported on 02/05/2017 08/18/15   Lance Bosch, NP  naproxen (NAPROSYN) 250 MG tablet Take 1 tablet (250 mg total) by mouth 2 (two) times daily with a meal. Patient not taking: Reported on 02/05/2017 01/03/17   Janne Napoleon, NP  pravastatin (PRAVACHOL) 20 MG tablet Take 1 tablet (20 mg total) by mouth daily. Patient not taking: Reported on 02/05/2017 07/19/16   Maren Reamer, MD    Family History No family history on file.  Social History Social History  Substance Use Topics  . Smoking status: Former Smoker    Quit date: 09/18/2001  . Smokeless tobacco: Never Used  . Alcohol use Yes     Allergies   Latex; Penicillins; Warfarin sodium; and Iodinated diagnostic agents   Review of Systems Review of Systems  Gastrointestinal: Positive for abdominal pain, diarrhea, nausea and vomiting.  All other systems reviewed and are negative.    Physical Exam Updated Vital Signs BP (!) 155/92   Pulse 63   Temp 97.8 F (36.6 C) (Oral)   Resp (!) 22   Ht '5\' 2"'$  (1.575 m)   Wt 250 lb (113.4 kg)   SpO2 100%   BMI 45.73 kg/m   Physical Exam  Constitutional: She is oriented to person, place, and time. She appears well-developed and well-nourished.  Curled up into fetal position on left side, moaning  HENT:  Head: Normocephalic and atraumatic.  Mouth/Throat: Oropharynx is clear and moist.  Eyes: Conjunctivae and EOM are normal. Pupils are equal, round, and reactive to light.  Neck: Normal range of motion.  Cardiovascular: Normal rate, regular rhythm and normal heart sounds.   Pulmonary/Chest: Effort  normal and breath sounds normal.  Abdominal: Soft. Bowel sounds are normal. There is no tenderness. There is no rigidity and no guarding.  Abdomen soft, overall non-tender, normal bowel sounds, no significant distention  Musculoskeletal: Normal range of motion.  Neurological: She is alert and oriented to person, place, and time.  Skin: Skin is warm and dry.  Psychiatric: She has a normal mood and affect.  Nursing note and vitals reviewed.    ED Treatments / Results  Labs (all labs ordered are listed, but only abnormal results are displayed) Labs Reviewed  COMPREHENSIVE METABOLIC PANEL - Abnormal; Notable for the following:       Result Value   Glucose, Bld 239 (*)    Creatinine, Ser 1.08 (*)    Albumin 3.4 (*)    GFR calc non Af Amer 54 (*)    All other components within normal limits  CBC - Abnormal; Notable for the  following:    WBC 14.1 (*)    All other components within normal limits  LIPASE, BLOOD  URINALYSIS, ROUTINE W REFLEX MICROSCOPIC    EKG  EKG Interpretation None       Radiology No results found.  Procedures Procedures (including critical care time)  Medications Ordered in ED Medications  sodium chloride 0.9 % bolus 1,000 mL (0 mLs Intravenous Stopped 02/05/17 0829)  ondansetron (ZOFRAN) injection 4 mg (4 mg Intravenous Given 02/05/17 0727)  ketorolac (TORADOL) 30 MG/ML injection 30 mg (30 mg Intravenous Given 02/05/17 0727)     Initial Impression / Assessment and Plan / ED Course  I have reviewed the triage vital signs and the nursing notes.  Pertinent labs & imaging results that were available during my care of the patient were reviewed by me and considered in my medical decision making (see chart for details).  61 year old female here with nausea, vomiting, and diarrhea. Onset around 3:30 AM this morning. She is afebrile and nontoxic. Abdomen is soft and benign. No distention. Bowel sounds are normal. Screening lab work is overall reassuring. She  does have a leukocytosis of 14K, may be reactive from her vomiting. Patient was treated here with IV fluids, Toradol, and Zofran with resolution of symptoms. She has been resting comfortably. Tolerating oral fluids well. Feels ready to go home. May be food related versus viral given sudden onset. Will have her follow-up closely with her primary care doctor.  Rx zofran.  Discussed plan with patient, she acknowledged understanding and agreed with plan of care.  Return precautions given for new or worsening symptoms.  Final Clinical Impressions(s) / ED Diagnoses   Final diagnoses:  Nausea vomiting and diarrhea    New Prescriptions Discharge Medication List as of 02/05/2017 11:54 AM    START taking these medications   Details  ondansetron (ZOFRAN ODT) 4 MG disintegrating tablet Take 1 tablet (4 mg total) by mouth every 8 (eight) hours as needed for nausea., Starting Mon 02/05/2017, Print         Larene Pickett, PA-C 02/05/17 [place], April, MD 02/06/17 1751

## 2017-02-05 NOTE — ED Notes (Signed)
Pt. Wheeled to restroom in wheel chair. Pt. States she cannot walk. Pt. Ambulatory previously to room. Pt. Educated about slowing her respiratory rate and helped to calm down by RN. Pt. Wheeled back to room and helped back into bed by RN. EDP made aware.

## 2017-02-05 NOTE — Discharge Instructions (Signed)
Lab work today looked good.  Recommend that you stay hydrated. Follow-up with your primary care doctor if any ongoing issues. Return here for new concerns.

## 2017-02-05 NOTE — ED Notes (Signed)
Pt. Asleep and desat to 80's on room air. Pt. Placed on 2L via nasal cannula. EDP made aware.

## 2017-02-19 DIAGNOSIS — I10 Essential (primary) hypertension: Secondary | ICD-10-CM | POA: Diagnosis not present

## 2017-02-19 DIAGNOSIS — Z86010 Personal history of colonic polyps: Secondary | ICD-10-CM | POA: Diagnosis not present

## 2017-02-19 DIAGNOSIS — R140 Abdominal distension (gaseous): Secondary | ICD-10-CM | POA: Diagnosis not present

## 2017-02-19 DIAGNOSIS — K5900 Constipation, unspecified: Secondary | ICD-10-CM | POA: Diagnosis not present

## 2017-02-28 DIAGNOSIS — H35373 Puckering of macula, bilateral: Secondary | ICD-10-CM | POA: Diagnosis not present

## 2017-02-28 DIAGNOSIS — H401122 Primary open-angle glaucoma, left eye, moderate stage: Secondary | ICD-10-CM | POA: Diagnosis not present

## 2017-02-28 DIAGNOSIS — H18413 Arcus senilis, bilateral: Secondary | ICD-10-CM | POA: Diagnosis not present

## 2017-02-28 DIAGNOSIS — Z961 Presence of intraocular lens: Secondary | ICD-10-CM | POA: Diagnosis not present

## 2017-02-28 DIAGNOSIS — H47233 Glaucomatous optic atrophy, bilateral: Secondary | ICD-10-CM | POA: Diagnosis not present

## 2017-02-28 DIAGNOSIS — H11153 Pinguecula, bilateral: Secondary | ICD-10-CM | POA: Diagnosis not present

## 2017-02-28 DIAGNOSIS — Z9849 Cataract extraction status, unspecified eye: Secondary | ICD-10-CM | POA: Diagnosis not present

## 2017-02-28 DIAGNOSIS — H401111 Primary open-angle glaucoma, right eye, mild stage: Secondary | ICD-10-CM | POA: Diagnosis not present

## 2017-02-28 DIAGNOSIS — H11423 Conjunctival edema, bilateral: Secondary | ICD-10-CM | POA: Diagnosis not present

## 2017-03-06 DIAGNOSIS — K635 Polyp of colon: Secondary | ICD-10-CM | POA: Diagnosis not present

## 2017-03-06 DIAGNOSIS — Z86010 Personal history of colonic polyps: Secondary | ICD-10-CM | POA: Diagnosis not present

## 2017-03-06 DIAGNOSIS — K5730 Diverticulosis of large intestine without perforation or abscess without bleeding: Secondary | ICD-10-CM | POA: Diagnosis not present

## 2017-03-06 DIAGNOSIS — D125 Benign neoplasm of sigmoid colon: Secondary | ICD-10-CM | POA: Diagnosis not present

## 2017-03-06 DIAGNOSIS — D120 Benign neoplasm of cecum: Secondary | ICD-10-CM | POA: Diagnosis not present

## 2017-03-06 DIAGNOSIS — D179 Benign lipomatous neoplasm, unspecified: Secondary | ICD-10-CM | POA: Diagnosis not present

## 2017-03-06 DIAGNOSIS — D123 Benign neoplasm of transverse colon: Secondary | ICD-10-CM | POA: Diagnosis not present

## 2017-03-22 ENCOUNTER — Ambulatory Visit: Payer: Medicare HMO | Attending: Internal Medicine | Admitting: Internal Medicine

## 2017-03-22 ENCOUNTER — Encounter: Payer: Self-pay | Admitting: Internal Medicine

## 2017-03-22 VITALS — BP 125/80 | HR 71 | Temp 98.2°F | Wt 246.4 lb

## 2017-03-22 DIAGNOSIS — J45909 Unspecified asthma, uncomplicated: Secondary | ICD-10-CM | POA: Insufficient documentation

## 2017-03-22 DIAGNOSIS — Z7982 Long term (current) use of aspirin: Secondary | ICD-10-CM | POA: Diagnosis not present

## 2017-03-22 DIAGNOSIS — E119 Type 2 diabetes mellitus without complications: Secondary | ICD-10-CM | POA: Diagnosis present

## 2017-03-22 DIAGNOSIS — J302 Other seasonal allergic rhinitis: Secondary | ICD-10-CM

## 2017-03-22 DIAGNOSIS — Z86010 Personal history of colonic polyps: Secondary | ICD-10-CM | POA: Insufficient documentation

## 2017-03-22 DIAGNOSIS — E1165 Type 2 diabetes mellitus with hyperglycemia: Secondary | ICD-10-CM

## 2017-03-22 DIAGNOSIS — N183 Chronic kidney disease, stage 3 unspecified: Secondary | ICD-10-CM

## 2017-03-22 DIAGNOSIS — R1115 Cyclical vomiting syndrome unrelated to migraine: Secondary | ICD-10-CM | POA: Insufficient documentation

## 2017-03-22 DIAGNOSIS — G43A0 Cyclical vomiting, not intractable: Secondary | ICD-10-CM

## 2017-03-22 DIAGNOSIS — Z6841 Body Mass Index (BMI) 40.0 and over, adult: Secondary | ICD-10-CM | POA: Insufficient documentation

## 2017-03-22 DIAGNOSIS — E785 Hyperlipidemia, unspecified: Secondary | ICD-10-CM | POA: Insufficient documentation

## 2017-03-22 DIAGNOSIS — Z87891 Personal history of nicotine dependence: Secondary | ICD-10-CM | POA: Insufficient documentation

## 2017-03-22 DIAGNOSIS — Z91041 Radiographic dye allergy status: Secondary | ICD-10-CM | POA: Insufficient documentation

## 2017-03-22 DIAGNOSIS — I10 Essential (primary) hypertension: Secondary | ICD-10-CM

## 2017-03-22 DIAGNOSIS — Z91040 Latex allergy status: Secondary | ICD-10-CM | POA: Insufficient documentation

## 2017-03-22 DIAGNOSIS — M1712 Unilateral primary osteoarthritis, left knee: Secondary | ICD-10-CM | POA: Diagnosis not present

## 2017-03-22 DIAGNOSIS — Z7984 Long term (current) use of oral hypoglycemic drugs: Secondary | ICD-10-CM | POA: Diagnosis not present

## 2017-03-22 DIAGNOSIS — I129 Hypertensive chronic kidney disease with stage 1 through stage 4 chronic kidney disease, or unspecified chronic kidney disease: Secondary | ICD-10-CM | POA: Insufficient documentation

## 2017-03-22 DIAGNOSIS — E669 Obesity, unspecified: Secondary | ICD-10-CM | POA: Insufficient documentation

## 2017-03-22 DIAGNOSIS — E1122 Type 2 diabetes mellitus with diabetic chronic kidney disease: Secondary | ICD-10-CM | POA: Diagnosis not present

## 2017-03-22 DIAGNOSIS — R634 Abnormal weight loss: Secondary | ICD-10-CM | POA: Diagnosis not present

## 2017-03-22 DIAGNOSIS — Z880 Allergy status to penicillin: Secondary | ICD-10-CM | POA: Insufficient documentation

## 2017-03-22 DIAGNOSIS — IMO0002 Reserved for concepts with insufficient information to code with codable children: Secondary | ICD-10-CM

## 2017-03-22 DIAGNOSIS — Z77120 Contact with and (suspected) exposure to mold (toxic): Secondary | ICD-10-CM | POA: Diagnosis not present

## 2017-03-22 DIAGNOSIS — Z79899 Other long term (current) drug therapy: Secondary | ICD-10-CM | POA: Diagnosis not present

## 2017-03-22 DIAGNOSIS — Z888 Allergy status to other drugs, medicaments and biological substances status: Secondary | ICD-10-CM | POA: Diagnosis not present

## 2017-03-22 DIAGNOSIS — E890 Postprocedural hypothyroidism: Secondary | ICD-10-CM | POA: Insufficient documentation

## 2017-03-22 LAB — POCT GLYCOSYLATED HEMOGLOBIN (HGB A1C): Hemoglobin A1C: 8.2

## 2017-03-22 LAB — GLUCOSE, POCT (MANUAL RESULT ENTRY): POC Glucose: 167 mg/dl — AB (ref 70–99)

## 2017-03-22 MED ORDER — LEVOTHYROXINE SODIUM 100 MCG PO TABS: 100.0000 ug | ORAL_TABLET | Freq: Every day | ORAL | 3 refills | Status: DC

## 2017-03-22 MED ORDER — GLIPIZIDE 10 MG PO TABS: 10.0000 mg | ORAL_TABLET | Freq: Two times a day (BID) | ORAL | 5 refills | Status: DC

## 2017-03-22 MED ORDER — LISINOPRIL 20 MG PO TABS: 20.0000 mg | ORAL_TABLET | Freq: Every day | ORAL | 3 refills | Status: DC

## 2017-03-22 MED ORDER — HYDROCHLOROTHIAZIDE 12.5 MG PO CAPS: 12.5000 mg | ORAL_CAPSULE | Freq: Every day | ORAL | 3 refills | Status: DC

## 2017-03-22 MED ORDER — LORATADINE 10 MG PO TABS: 10.0000 mg | ORAL_TABLET | Freq: Every day | ORAL | 11 refills | Status: DC

## 2017-03-22 MED ORDER — PRAVASTATIN SODIUM 20 MG PO TABS: 20.0000 mg | ORAL_TABLET | Freq: Every day | ORAL | 3 refills | Status: DC

## 2017-03-22 MED ORDER — SITAGLIPTIN PHOSPHATE 50 MG PO TABS: 50.0000 mg | ORAL_TABLET | Freq: Every day | ORAL | 5 refills | Status: DC

## 2017-03-22 MED ORDER — ALBUTEROL SULFATE 108 (90 BASE) MCG/ACT IN AEPB: 2.0000 | INHALATION_SPRAY | Freq: Four times a day (QID) | RESPIRATORY_TRACT | 12 refills | Status: DC | PRN

## 2017-03-22 MED ORDER — ONDANSETRON 4 MG PO TBDP: 4.0000 mg | ORAL_TABLET | Freq: Two times a day (BID) | ORAL | 0 refills | Status: DC | PRN

## 2017-03-22 NOTE — Patient Instructions (Addendum)
Please sign release for me to get colonoscopy report and pathology results from Dr. Benson Norway.   You should speak with [employer] about being reassigned to another apartment/house.   Use the Zofran as needed for nausea.   Start Januvia to help get diabetes under control.

## 2017-03-22 NOTE — Progress Notes (Signed)
Patient ID: ALEESHA RINGSTAD, female    DOB: May 17, 1956  MRN: [id number]  CC: Diabetes   Subjective: Kayla Bean is a 61 y.o. female who presents to est with me as PCP and chronic ds management Her concerns today include:  Patient with history of diabetes, hypertension, hypothyroidism, asthma, CKD stage III, and obesity. Last saw Dr. Clide Dales November 2017  1. Had Colonoscopy 2 wks ago by Dr. Ronalee Red -3 polyps removed -had MMG  2. DM -has meter but got letter from company stating they had a recall on stripes so not checking BS -joined [employer] but she has not gone as yet -snack on fruits.  Tries to eat healthy  3.  Has a moldy/mildew smell in house x 11 yrs -spoke with landlord about it -wakes up every morning sick on her stomach with dry heaves and sometimes vomiting since April.  She attributes this to the smell in the house. -Denies any problems swallowing. No nausea or vomiting when she eats. Eating 3 meals a day but has lost weight unintentionally. She feels the weight loss is due to the vomiting every morning -found broken pipe under her house last wk. she feels the mildew smell is probably from mold growing under the house -landlord has fixed the pipe -can not afford to move. [place] Patient Active Problem List   Diagnosis Date Noted  . Elevated WBC count 04/05/2016  . Abnormal CT scan, neck 04/05/2016  . Neck stiffness 04/04/2016  . Abdominal pain, chronic, right lower quadrant 05/23/2013  . Lymphadenopathy 05/23/2013  . CKD stage 3 due to type 2 diabetes mellitus ([place]) 03/19/2013  . Cholelithiasis 12/24/2012  . LIPOMA 07/26/2010  . SKIN TAG 11/19/2009  . SEBACEOUS CYST 11/19/2009  . CONSTIPATION, CHRONIC 01/18/2009  . EUSTACHIAN TUBE DYSFUNCTION, RIGHT 10/16/2008  . LOM 09/01/2008  . Essential hypertension 08/18/2008  . OSTEOARTHRITIS, KNEE, LEFT 04/23/2008  . PLANTAR FASCIITIS, RIGHT 04/23/2008  . HAIR LOSS 12/16/2007  . OTHER  ABNORMAL BLOOD CHEMISTRY 12/16/2007  . Hypothyroidism 10/15/2007  . Diabetes type 2, uncontrolled ([place]) 09/04/2007  . ASTHMA 09/04/2007  . ARTHROSCOPY, RIGHT KNEE, HX OF 04/17/2006     Current Outpatient Prescriptions on File Prior to Visit  Medication Sig Dispense Refill  . ACCU-CHEK SOFTCLIX LANCETS lancets Use as instructed for 3 times daily testing of blood glucose 100 each 12  . aspirin 81 MG tablet Take 1 tablet (81 mg total) by mouth daily. 90 tablet 3  . Blood Glucose Monitoring Suppl (ACCU-CHEK AVIVA PLUS) w/Device KIT Use as directed 3 times daily for blood glucose monitoring 1 kit 0  . brimonidine-timolol (COMBIGAN) 0.2-0.5 % ophthalmic solution Place 1 drop into both eyes every 12 (twelve) hours.    Marland Kitchen glucose blood (ACCU-CHEK AVIVA PLUS) test strip Use as instructed for 3 times daily testing of blood glucose 100 each 12  . Lancet Devices (ACCU-CHEK SOFTCLIX) lancets Use as instructed for 3 times daily testing of blood glucose 1 each 0  . lubiprostone (AMITIZA) 24 MCG capsule Take 1 capsule (24 mcg total) by mouth daily with breakfast. 90 capsule 3  . diclofenac sodium (VOLTAREN) 1 % GEL Apply 1 application topically 4 (four) times daily. (Patient not taking: Reported on 02/05/2017) 100 g 0   No current facility-administered medications on file prior to visit.     Allergies  Allergen Reactions  . Latex Hives  . Metformin And Related     Hair loss  . Penicillins Hives  .  Warfarin Sodium Hives    REACTION: Got rash when took one pill of coumadin  related to knee replacement surgery in 2007.  . Iodinated Diagnostic Agents Other (See Comments)    PT STATES SHE HAD AN ALLERGIC REACTION OF BLISTERS ON HANDS AND FEET 2 DAYS AFTER CT SCAN W/ IV CONTRAST INJECTION, DR. MANSELL SUGGESTS 13 HR PREP//A.C.    Social History   Social History  . Marital status: Single    Spouse name: N/A  . Number of children: N/A  . Years of education: N/A   Occupational History  . Not on file.     Social History Main Topics  . Smoking status: Former Smoker    Quit date: 09/18/2001  . Smokeless tobacco: Never Used  . Alcohol use Yes  . Drug use: No  . Sexual activity: Not on file   Other Topics Concern  . Not on file   Social History Narrative  . No narrative on file    No family history on file.  Past Surgical History:  Procedure Laterality Date  . ABDOMINAL HYSTERECTOMY    . KNEE SURGERY     left  . THYROIDECTOMY    . TOTAL KNEE ARTHROPLASTY     right knee    ROS: Review of Systems As stated above  PHYSICAL EXAM: BP 125/80   Pulse 71   Temp 98.2 F (36.8 C) (Oral)   Wt 246 lb 6.4 oz (111.8 kg)   SpO2 100%   BMI 45.07 kg/m   Wt Readings from Last 3 Encounters:  03/22/17 246 lb 6.4 oz (111.8 kg)  02/05/17 250 lb (113.4 kg)  07/19/16 265 lb 3.2 oz (120.3 kg)   Physical Exam  General appearance - alert, well appearing, older African-American female and in no distress Mental status - alert, oriented to person, place, and time. A little tearful when talking about her house Mouth - mucous membranes moist, pharynx normal without lesions Neck - supple, no significant adenopathy Chest - clear to auscultation, no wheezes, rales or rhonchi, symmetric air entry Heart - normal rate, regular rhythm, normal S1, S2, no murmurs, rubs, clicks or gallops Extremities - peripheral pulses normal, no pedal edema, no clubbing or cyanosis   Results for orders placed or performed in visit on 03/22/17  POCT glucose (manual entry)  Result Value Ref Range   POC Glucose 167 (A) 70 - 99 mg/dl  POCT glycosylated hemoglobin (Hb A1C)  Result Value Ref Range   Hemoglobin A1C 8.2    A1C 8.2  Lab Results  Component Value Date   WBC 14.1 (H) 02/05/2017   HGB 13.7 02/05/2017   HCT 39.7 02/05/2017   MCV 88.6 02/05/2017   PLT 275 02/05/2017     Chemistry      Component Value Date/Time   NA 135 02/05/2017 0549   K 3.6 02/05/2017 0549   CL 101 02/05/2017 0549   CO2 23  02/05/2017 0549   BUN 17 02/05/2017 0549   CREATININE 1.08 (H) 02/05/2017 0549   CREATININE 1.29 (H) 07/19/2016 1105      Component Value Date/Time   CALCIUM 8.9 02/05/2017 0549   ALKPHOS 109 02/05/2017 0549   AST 16 02/05/2017 0549   ALT 14 02/05/2017 0549   BILITOT 0.7 02/05/2017 0549     Depression screen PHQ 2/9 07/19/2016  Decreased Interest 0  Down, Depressed, Hopeless 0  PHQ - 2 Score 0  Altered sleeping -  Tired, decreased energy -  Change in appetite -  Feeling bad or failure about yourself  -  Trouble concentrating -  Moving slowly or fidgety/restless -  Suicidal thoughts -  PHQ-9 Score -  Difficult doing work/chores -   GAD 7 : Generalized Anxiety Score 07/19/2016 04/04/2016 04/04/2016 03/14/2016  Nervous, Anxious, on Edge 0 0 0 2  Control/stop worrying 0 0 0 2  Worry too much - different things 0 0 0 0  Trouble relaxing 0 _0 Restless 0 3 3 0  Easily annoyed or irritable 0 0 0 0  Afraid - awful might happen 0 0 0 3  Total GAD 7 Score 0 _1 Anxiety Difficulty - - - Somewhat difficult     ASSESSMENT AND PLAN: 1. Uncontrolled type 2 diabetes mellitus with stage 3 chronic kidney disease, without long-term current use of insulin (HCC) -Not at goal. Continue glipizide. Add Januvia. Patient is intolerant of metformin so this medication could not be added. - POCT glucose (manual entry) - POCT glycosylated hemoglobin (Hb A1C) - sitaGLIPtin (JANUVIA) 50 MG tablet; Take 1 tablet (50 mg total) by mouth daily.  Dispense: 30 tablet; Refill: 5 - glipiZIDE (GLUCOTROL) 10 MG tablet; Take 1 tablet (10 mg total) by mouth 2 (two) times daily before a meal.  Dispense: 60 tablet; Refill: 5 - Lipid panel  2. Essential hypertension -At goal - hydrochlorothiazide (MICROZIDE) 12.5 MG capsule; Take 1 capsule (12.5 mg total) by mouth daily. Must have office visit for refills  Dispense: 90 capsule; Refill: 3 - lisinopril (PRINIVIL,ZESTRIL) 20 MG tablet; Take 1 tablet (20 mg total)  by mouth daily.  Dispense: 90 tablet; Refill: 3  3. Postoperative hypothyroidism -Check thyroid level today given unintentional weight loss - levothyroxine (SYNTHROID, LEVOTHROID) 100 MCG tablet; Take 1 tablet (100 mcg total) by mouth daily.  Dispense: 90 tablet; Refill: 3 - TSH+T4F+T3Free  4. Weight loss, unintentional See #3 above -Zofran as needed for nausea.  5. Other seasonal allergic rhinitis - loratadine (CLARITIN) 10 MG tablet; Take 1 tablet (10 mg total) by mouth daily.  Dispense: 30 tablet; Refill: 11  6. Mold exposure -Encourage patient to speak with her landlord for the house and calls to be checked for moles. Other option would be to try to acquire a different section 8 housing for rental.  7. Cyclical vomiting with nausea, intractability of vomiting not specified - ondansetron (ZOFRAN ODT) 4 MG disintegrating tablet; Take 1 tablet (4 mg total) by mouth 2 (two) times daily as needed for nausea.  Dispense: 30 tablet; Refill: 0  8. Hyperlipidemia, unspecified hyperlipidemia type - pravastatin (PRAVACHOL) 20 MG tablet; Take 1 tablet (20 mg total) by mouth daily.  Dispense: 90 tablet; Refill: 3  Patient was given the opportunity to ask questions.  Patient verbalized understanding of the plan and was able to repeat key elements of the plan.   Orders Placed This Encounter  Procedures  . TSH+T4F+T3Free  . Lipid panel  . POCT glucose (manual entry)  . POCT glycosylated hemoglobin (Hb A1C)     Requested Prescriptions   Signed Prescriptions Disp Refills  . sitaGLIPtin (JANUVIA) 50 MG tablet 30 tablet 5    Sig: Take 1 tablet (50 mg total) by mouth daily.  . ondansetron (ZOFRAN ODT) 4 MG disintegrating tablet 30 tablet 0    Sig: Take 1 tablet (4 mg total) by mouth 2 (two) times daily as needed for nausea.  . Albuterol Sulfate (PROAIR RESPICLICK) 127 (90 Base) MCG/ACT AEPB 1 each 12    Sig:  Inhale 2 puffs into the lungs every 6 (six) hours as needed (for breathing).  Marland Kitchen  glipiZIDE (GLUCOTROL) 10 MG tablet 60 tablet 5    Sig: Take 1 tablet (10 mg total) by mouth 2 (two) times daily before a meal.  . hydrochlorothiazide (MICROZIDE) 12.5 MG capsule 90 capsule 3    Sig: Take 1 capsule (12.5 mg total) by mouth daily. Must have office visit for refills  . levothyroxine (SYNTHROID, LEVOTHROID) 100 MCG tablet 90 tablet 3    Sig: Take 1 tablet (100 mcg total) by mouth daily.  Marland Kitchen lisinopril (PRINIVIL,ZESTRIL) 20 MG tablet 90 tablet 3    Sig: Take 1 tablet (20 mg total) by mouth daily.  Marland Kitchen loratadine (CLARITIN) 10 MG tablet 30 tablet 11    Sig: Take 1 tablet (10 mg total) by mouth daily.  . pravastatin (PRAVACHOL) 20 MG tablet 90 tablet 3    Sig: Take 1 tablet (20 mg total) by mouth daily.    Return in about 1 month (around 04/22/2017).  Karle Plumber, MD, FACP

## 2017-03-23 ENCOUNTER — Telehealth: Payer: Self-pay | Admitting: Internal Medicine

## 2017-03-23 DIAGNOSIS — E890 Postprocedural hypothyroidism: Secondary | ICD-10-CM

## 2017-03-23 LAB — TSH+T4F+T3FREE
Free T4: 1.54 ng/dL (ref 0.82–1.77)
T3, Free: 2.7 pg/mL (ref 2.0–4.4)
TSH: 0.493 u[IU]/mL (ref 0.450–4.500)

## 2017-03-23 LAB — LIPID PANEL
Chol/HDL Ratio: 3.5 ratio (ref 0.0–4.4)
Cholesterol, Total: 116 mg/dL (ref 100–199)
HDL: 33 mg/dL — ABNORMAL LOW (ref >39)
LDL Calculated: 57 mg/dL (ref 0–99)
Triglycerides: 128 mg/dL (ref 0–149)
VLDL Cholesterol Cal: 26 mg/dL (ref 5–40)

## 2017-03-23 MED ORDER — LEVOTHYROXINE SODIUM 88 MCG PO TABS: 88.0000 ug | ORAL_TABLET | Freq: Every day | ORAL | 3 refills | Status: DC

## 2017-03-23 NOTE — Telephone Encounter (Signed)
Phone call placed to patient today to go over lab results. Her TSH was in the low normal range. Given her unintentional weight loss, I recommend decreasing the dose of the levothyroxine from 100 to 88 mcg. New rxn sent to her pharmacy. - patient reports that the prescriptions that I gave yesterday came to $75. The 2 new medicines that were prescribed for Januvia and Zofran. I also sent refills on some of her other medications. I told the patient that Januvia may have a higher co-pay but if she doesn't need refills on her other medicines at this time she can tell the pharmacy to hold off on refilling the ones she does not need right now. She will pick up rxn for the Levothyroxine 88 mcg.

## 2017-03-26 NOTE — Telephone Encounter (Signed)
Canceled all refills for 144mcg for synthroid

## 2017-03-27 IMAGING — CT CT NECK W/O CM
4 of 5 series · 16 of 33 positions shown, 18 images · IV contrast (Omni 300)
Comparison: Cervical radiographs dated 01/27/2016.

CLINICAL DATA: 59 y/o F; neck stiffness and pain from last night.
History of thyroidectomy in 9837. History of smoking.

EXAM:
CT NECK WITHOUT CONTRAST
TECHNIQUE: Multidetector CT imaging of the neck was performed following the
standard protocol without intravenous contrast.

[Series 2: neck 2.0 st · axial · 0.53mm/px · z∈[+218,+340]mm · 4 of 103 slices shown, 5 images (1 of 3)]
[im 21/103  soft-tissue]
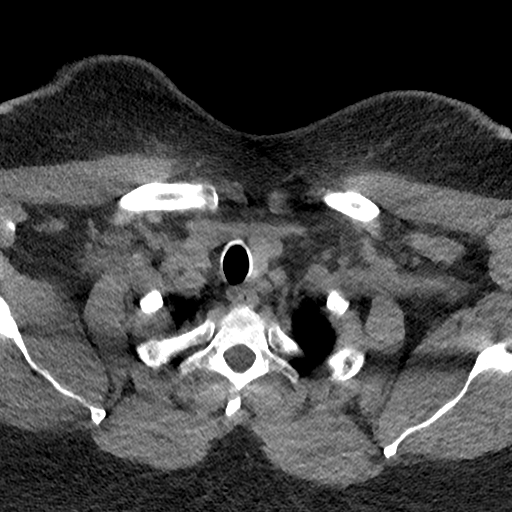
[im 21/103  bone]
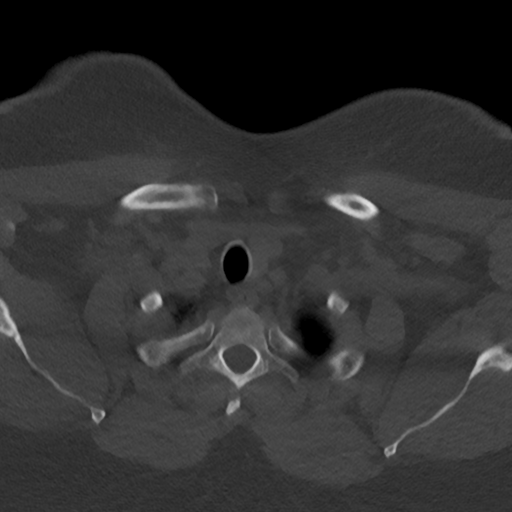
[im 41/103  bone]
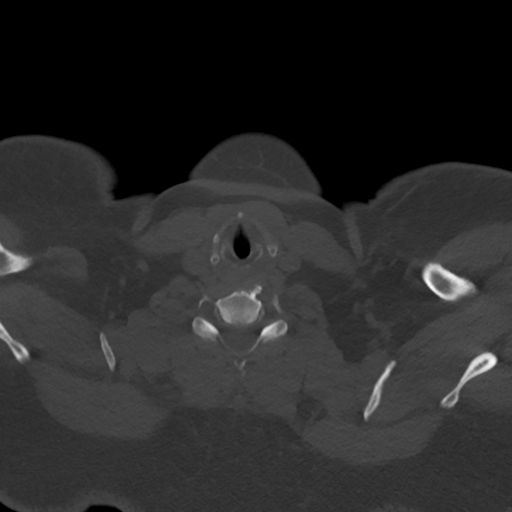
[im 62/103  bone]
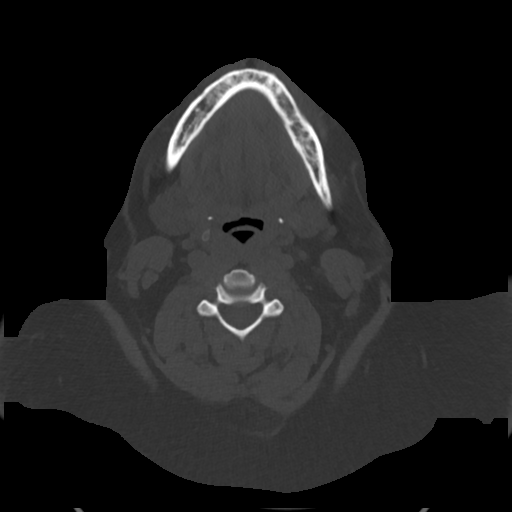
[im 82/103  bone]
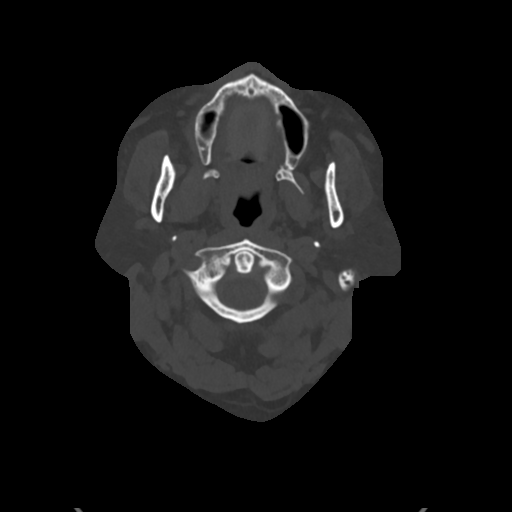

[Series 4: neck 2.0 st · sagittal · 0.43mm/px · 5 of 101 slices shown, 6 images (2 of 3)]
[im 34/101  bone]
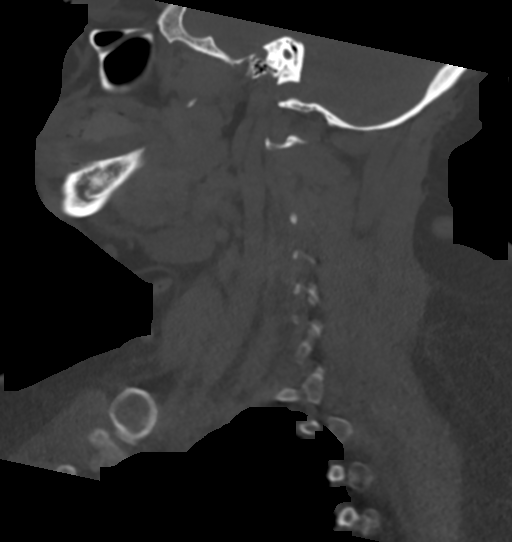
[im 42/101  bone]
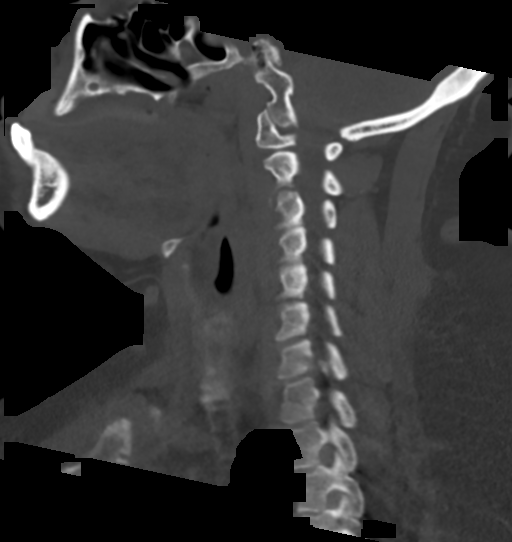
[im 51/101  soft-tissue]
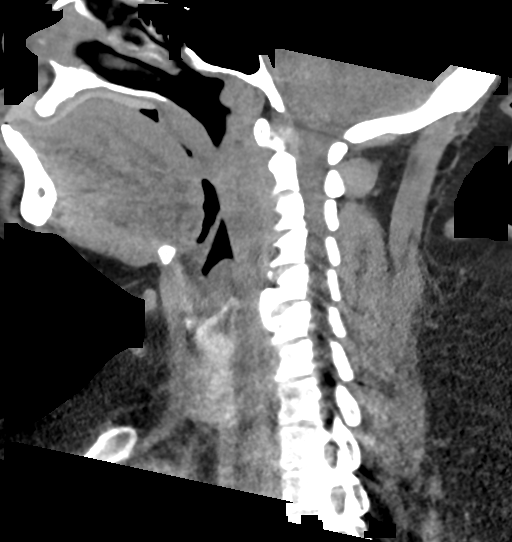
[im 51/101  bone]
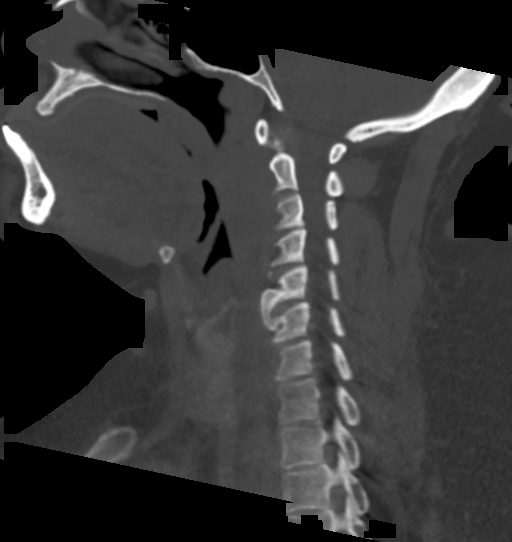
[im 59/101  bone]
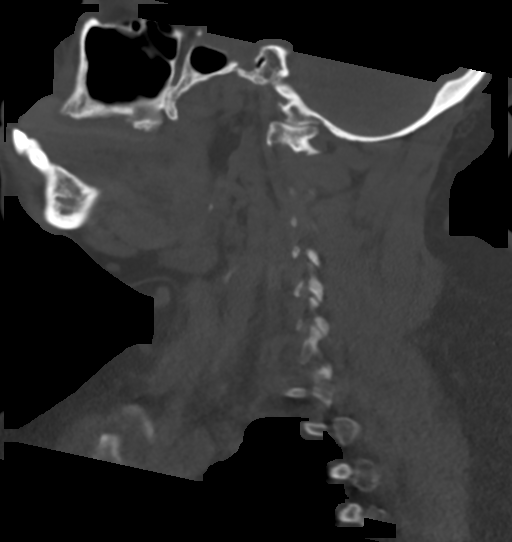
[im 67/101  bone]
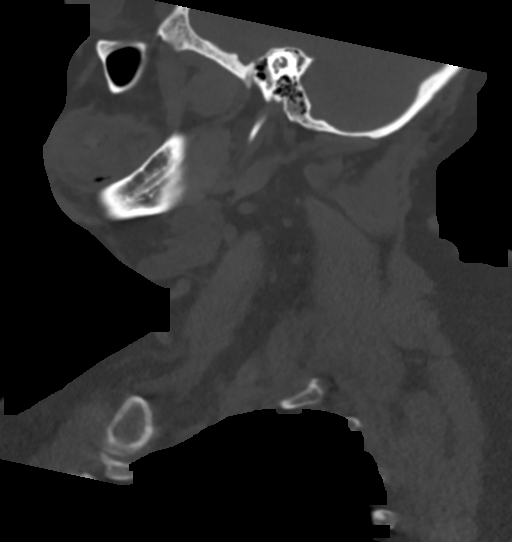

[Series 5: neck 2.0 st · coronal · 0.42mm/px · 3 of 118 slices shown (3 of 3)]
[im 24/118  bone]
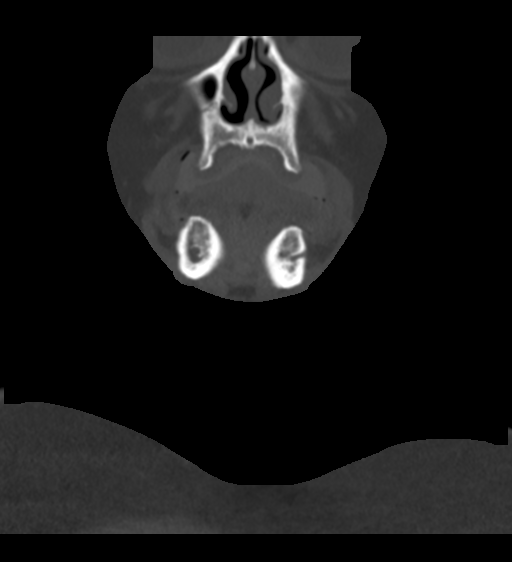
[im 47/118  bone]
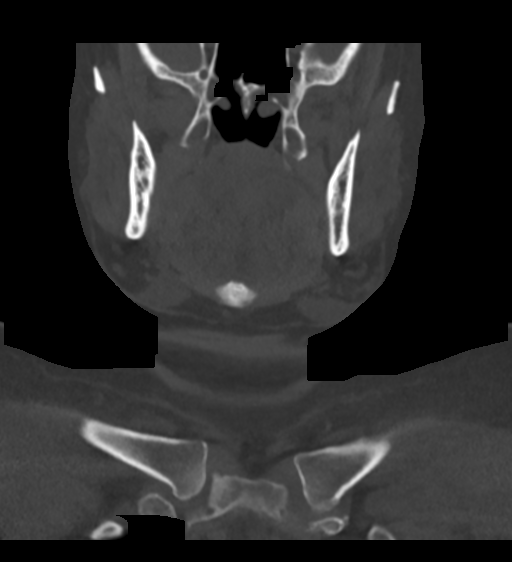
[im 71/118  bone]
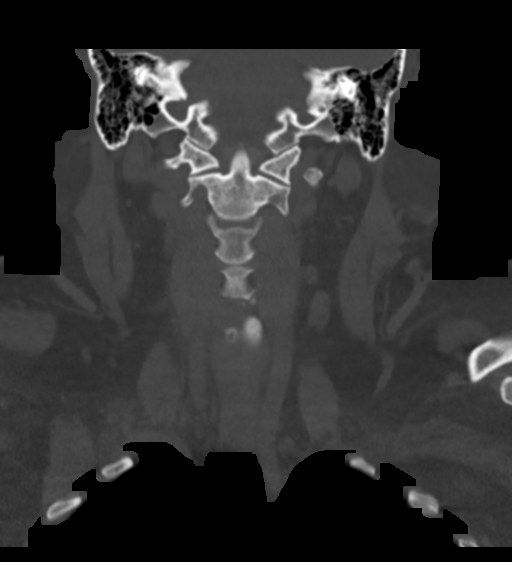

[Series 6: neck 2.0 st orthogonal · axial · 0.39mm/px · z∈[+210,+339]mm · 4 of 109 slices shown]
[im 22/109  bone]
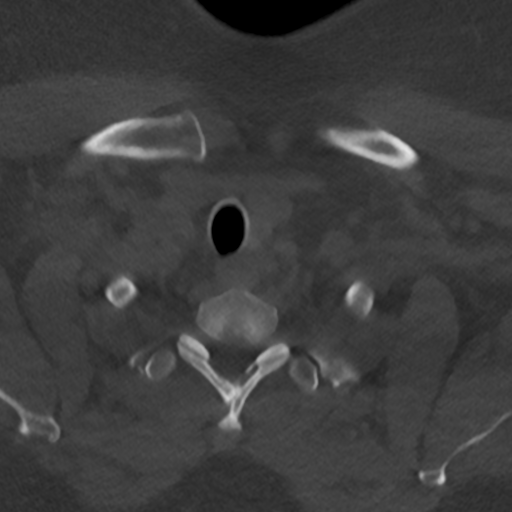
[im 44/109  bone]
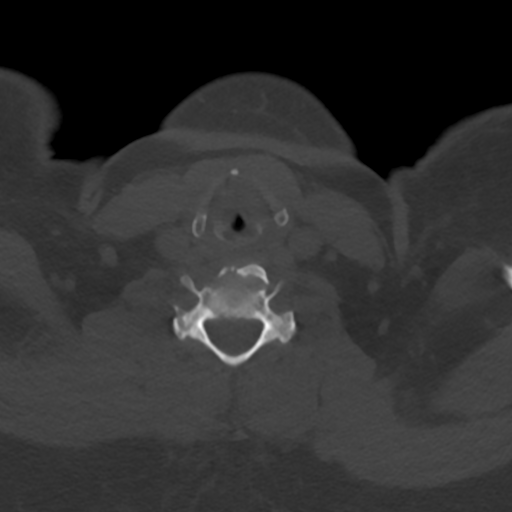
[im 65/109  bone]
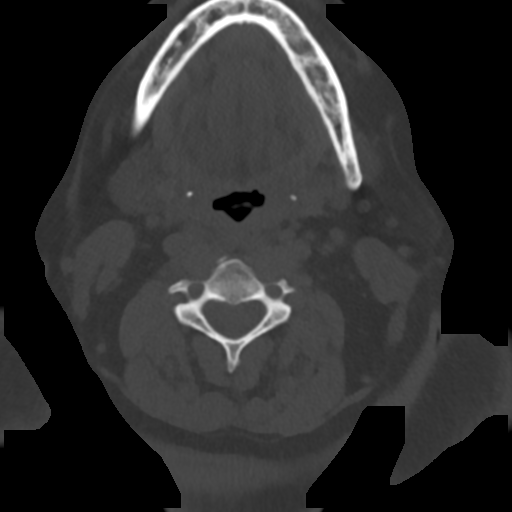
[im 87/109  bone]
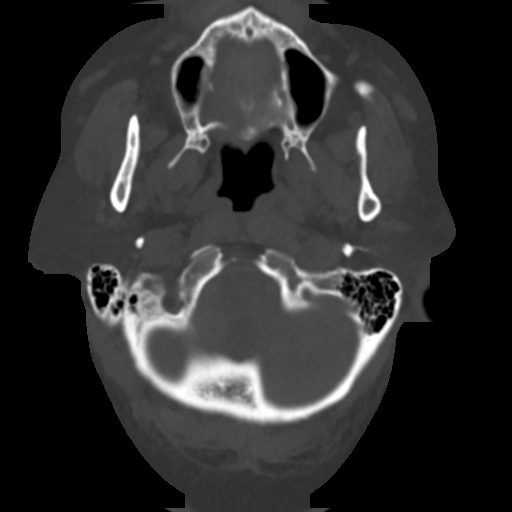

[16 of 33 positions shown; findings below may reference images not displayed]

FINDINGS: Pharynx and larynx: Patent airway. No exophytic mucosal masses
identified. Nonspecific effacement of the left piriform sinus.

Salivary glands: Unremarkable.

Thyroid: Residual thyroid tissue within the thyroid bed bilaterally.

Lymph nodes: No lymphadenopathy.

Vascular: Unremarkable.

Limited intracranial: Unremarkable.

Visualized orbits: The bilateral intra-ocular lens replacement,
otherwise unremarkable.

Mastoids and visualized paranasal sinuses: Polypoid mucosal
thickening within the left anterior nasal passages. Visualized
paranasal sinuses are otherwise unremarkable. Visualized mastoid air
cells are clear.

Skeleton: Positional straightening of cervical lordosis. No
listhesis. No acute bony or articular abnormality is identified.
Clear in

Upper chest: Clear lung apices.
IMPRESSION: 1. No discrete mass or collection of the neck is identified. No
finding relatable to neck stiffness and pain is identified.
2. Nonspecific effacement of the left piriform sinus. As the patient
cannot receive intravenous contrast, direct visualization is
recommended to exclude a mucosal lesion.

By: Angi Billiot M.D.

## 2017-04-24 ENCOUNTER — Ambulatory Visit: Payer: Medicare HMO | Admitting: Internal Medicine

## 2017-05-07 DIAGNOSIS — M1712 Unilateral primary osteoarthritis, left knee: Secondary | ICD-10-CM | POA: Diagnosis not present

## 2017-05-09 ENCOUNTER — Telehealth: Payer: Self-pay | Admitting: Internal Medicine

## 2017-05-09 NOTE — Telephone Encounter (Signed)
Pt called since she having a bad chest congestive and coughing, she is wondering if you can order something for her, please follow up

## 2017-05-10 ENCOUNTER — Ambulatory Visit: Payer: Medicare HMO | Attending: Internal Medicine | Admitting: Internal Medicine

## 2017-05-10 ENCOUNTER — Encounter: Payer: Self-pay | Admitting: Internal Medicine

## 2017-05-10 VITALS — BP 141/82 | HR 50 | Temp 98.6°F | Resp 16 | Wt 258.6 lb

## 2017-05-10 DIAGNOSIS — N183 Chronic kidney disease, stage 3 unspecified: Secondary | ICD-10-CM

## 2017-05-10 DIAGNOSIS — Z7982 Long term (current) use of aspirin: Secondary | ICD-10-CM | POA: Diagnosis not present

## 2017-05-10 DIAGNOSIS — J4521 Mild intermittent asthma with (acute) exacerbation: Secondary | ICD-10-CM | POA: Insufficient documentation

## 2017-05-10 DIAGNOSIS — E785 Hyperlipidemia, unspecified: Secondary | ICD-10-CM | POA: Diagnosis not present

## 2017-05-10 DIAGNOSIS — Z87891 Personal history of nicotine dependence: Secondary | ICD-10-CM | POA: Diagnosis not present

## 2017-05-10 DIAGNOSIS — J069 Acute upper respiratory infection, unspecified: Secondary | ICD-10-CM | POA: Diagnosis not present

## 2017-05-10 DIAGNOSIS — I10 Essential (primary) hypertension: Secondary | ICD-10-CM

## 2017-05-10 DIAGNOSIS — E669 Obesity, unspecified: Secondary | ICD-10-CM | POA: Diagnosis not present

## 2017-05-10 DIAGNOSIS — Z23 Encounter for immunization: Secondary | ICD-10-CM | POA: Diagnosis not present

## 2017-05-10 DIAGNOSIS — I129 Hypertensive chronic kidney disease with stage 1 through stage 4 chronic kidney disease, or unspecified chronic kidney disease: Secondary | ICD-10-CM | POA: Insufficient documentation

## 2017-05-10 DIAGNOSIS — E1122 Type 2 diabetes mellitus with diabetic chronic kidney disease: Secondary | ICD-10-CM | POA: Diagnosis not present

## 2017-05-10 DIAGNOSIS — E1165 Type 2 diabetes mellitus with hyperglycemia: Secondary | ICD-10-CM

## 2017-05-10 DIAGNOSIS — G43A0 Cyclical vomiting, not intractable: Secondary | ICD-10-CM | POA: Insufficient documentation

## 2017-05-10 DIAGNOSIS — R05 Cough: Secondary | ICD-10-CM | POA: Diagnosis not present

## 2017-05-10 DIAGNOSIS — B349 Viral infection, unspecified: Secondary | ICD-10-CM | POA: Diagnosis not present

## 2017-05-10 DIAGNOSIS — Z7984 Long term (current) use of oral hypoglycemic drugs: Secondary | ICD-10-CM | POA: Insufficient documentation

## 2017-05-10 DIAGNOSIS — E890 Postprocedural hypothyroidism: Secondary | ICD-10-CM | POA: Diagnosis not present

## 2017-05-10 DIAGNOSIS — IMO0002 Reserved for concepts with insufficient information to code with codable children: Secondary | ICD-10-CM

## 2017-05-10 LAB — GLUCOSE, POCT (MANUAL RESULT ENTRY): POC Glucose: 136 mg/dl — AB (ref 70–99)

## 2017-05-10 MED ORDER — ALBUTEROL SULFATE (2.5 MG/3ML) 0.083% IN NEBU: 2.5000 mg | INHALATION_SOLUTION | Freq: Once | RESPIRATORY_TRACT | Status: DC

## 2017-05-10 MED ORDER — PREDNISONE 20 MG PO TABS: ORAL_TABLET | ORAL | 0 refills | Status: DC

## 2017-05-10 MED ORDER — TRUE METRIX METER W/DEVICE KIT: PACK | 0 refills | Status: DC

## 2017-05-10 MED ORDER — BENZONATATE 100 MG PO CAPS: 100.0000 mg | ORAL_CAPSULE | Freq: Two times a day (BID) | ORAL | 0 refills | Status: DC | PRN

## 2017-05-10 MED ORDER — TRUEPLUS LANCETS 28G MISC: 1 refills | Status: DC

## 2017-05-10 MED ORDER — GLUCOSE BLOOD VI STRP: ORAL_STRIP | 12 refills | Status: DC

## 2017-05-10 MED FILL — TRUE METRIX TEST STRIP: 30 days supply | Qty: 100 | Fill #0

## 2017-05-10 MED FILL — !TRUE METRIX BLOOD GLUCOSE: 1 days supply | Qty: 1 | Fill #0

## 2017-05-10 NOTE — Progress Notes (Signed)
Patient ID: Kayla Bean, female    DOB: 1956/08/26  MRN: [id number]  CC: Cough and URI   Subjective: Kayla Bean is a 61 y.o. female who presents for  UC visit. Last seen 03/22/2017 Her concerns today include:  Patient with history of diabetes, hypertension, HL, hypothyroidism, asthma, CKD stage III, and obesity.   1. C/o congestion to extend that she can not lay down -started 4 days ago "as a nagging cough and it just got worse." -endorses sneezing, nasal congestion and rhinorrhea, cough productive of white/yellow phlegm and feeling SOB. No fever -increase use of Albuterol inhaler - about 2 x a day since this started. Normally uses about 2 x a yr -has Claritin and Flonase at home but has not been taking.   2. Seen 6 wks ago with cyclic vomiting and unintentional wgh loss. TSH was low normal range.  I recommended dec Levothyroxine from 100 to 88 mcg.  -no futher N/V.  She has not had to use the Zofran -no further wgh loss  3. DM: -she was able to get Januvia.  Taking Glucotrol also -not able to afford the stripes for her machine  4.  HTN: compliant with meds  Patient Active Problem List   Diagnosis Date Noted  . Weight loss, unintentional 03/22/2017  . Cyclical vomiting with nausea 03/22/2017  . Hyperlipidemia 03/22/2017  . Abdominal pain, chronic, right lower quadrant 05/23/2013  . Lymphadenopathy 05/23/2013  . CKD stage 3 due to type 2 diabetes mellitus ([place]) 03/19/2013  . Cholelithiasis 12/24/2012  . LIPOMA 07/26/2010  . SKIN TAG 11/19/2009  . SEBACEOUS CYST 11/19/2009  . CONSTIPATION, CHRONIC 01/18/2009  . EUSTACHIAN TUBE DYSFUNCTION, RIGHT 10/16/2008  . LOM 09/01/2008  . Essential hypertension 08/18/2008  . OSTEOARTHRITIS, KNEE, LEFT 04/23/2008  . PLANTAR FASCIITIS, RIGHT 04/23/2008  . Hypothyroidism 10/15/2007  . Diabetes type 2, uncontrolled ([place]) 09/04/2007  . ASTHMA 09/04/2007     Current Outpatient Prescriptions on File Prior to Visit    Medication Sig Dispense Refill  . Albuterol Sulfate (PROAIR RESPICLICK) 637 (90 Base) MCG/ACT AEPB Inhale 2 puffs into the lungs every 6 (six) hours as needed (for breathing). 1 each 12  . aspirin 81 MG tablet Take 1 tablet (81 mg total) by mouth daily. 90 tablet 3  . brimonidine-timolol (COMBIGAN) 0.2-0.5 % ophthalmic solution Place 1 drop into both eyes every 12 (twelve) hours.    . diclofenac sodium (VOLTAREN) 1 % GEL Apply 1 application topically 4 (four) times daily. (Patient not taking: Reported on 02/05/2017) 100 g 0  . glipiZIDE (GLUCOTROL) 10 MG tablet Take 1 tablet (10 mg total) by mouth 2 (two) times daily before a meal. 60 tablet 5  . hydrochlorothiazide (MICROZIDE) 12.5 MG capsule Take 1 capsule (12.5 mg total) by mouth daily. Must have office visit for refills 90 capsule 3  . Lancet Devices (ACCU-CHEK SOFTCLIX) lancets Use as instructed for 3 times daily testing of blood glucose 1 each 0  . levothyroxine (SYNTHROID, LEVOTHROID) 88 MCG tablet Take 1 tablet (88 mcg total) by mouth daily. 30 tablet 3  . lisinopril (PRINIVIL,ZESTRIL) 20 MG tablet Take 1 tablet (20 mg total) by mouth daily. 90 tablet 3  . loratadine (CLARITIN) 10 MG tablet Take 1 tablet (10 mg total) by mouth daily. 30 tablet 11  . lubiprostone (AMITIZA) 24 MCG capsule Take 1 capsule (24 mcg total) by mouth daily with breakfast. 90 capsule 3  . ondansetron (ZOFRAN ODT) 4 MG disintegrating tablet Take 1 tablet (  4 mg total) by mouth 2 (two) times daily as needed for nausea. 30 tablet 0  . pravastatin (PRAVACHOL) 20 MG tablet Take 1 tablet (20 mg total) by mouth daily. 90 tablet 3  . sitaGLIPtin (JANUVIA) 50 MG tablet Take 1 tablet (50 mg total) by mouth daily. 30 tablet 5   No current facility-administered medications on file prior to visit.     Allergies  Allergen Reactions  . Latex Hives  . Metformin And Related     Hair loss  . Penicillins Hives  . Warfarin Sodium Hives    REACTION: Got rash when took one pill of  coumadin  related to knee replacement surgery in 2007.  . Iodinated Diagnostic Agents Other (See Comments)    PT STATES SHE HAD AN ALLERGIC REACTION OF BLISTERS ON HANDS AND FEET 2 DAYS AFTER CT SCAN W/ IV CONTRAST INJECTION, DR. MANSELL SUGGESTS 13 HR PREP//A.C.    Social History   Social History  . Marital status: Single    Spouse name: N/A  . Number of children: N/A  . Years of education: N/A   Occupational History  . Not on file.   Social History Main Topics  . Smoking status: Former Smoker    Quit date: 09/18/2001  . Smokeless tobacco: Never Used  . Alcohol use Yes  . Drug use: No  . Sexual activity: Not on file   Other Topics Concern  . Not on file   Social History Narrative  . No narrative on file    No family history on file.  Past Surgical History:  Procedure Laterality Date  . ABDOMINAL HYSTERECTOMY    . KNEE SURGERY     left  . THYROIDECTOMY    . TOTAL KNEE ARTHROPLASTY     right knee    ROS: Review of Systems As stated above  PHYSICAL EXAM: BP (!) 141/82   Pulse (!) 50   Temp 98.6 F (37 C) (Oral)   Resp 16   Wt 258 lb 9.6 oz (117.3 kg)   SpO2 96%   BMI 47.30 kg/m   Wt Readings from Last 3 Encounters:  05/10/17 258 lb 9.6 oz (117.3 kg)  03/22/17 246 lb 6.4 oz (111.8 kg)  02/05/17 250 lb (113.4 kg)   Physical Exam General appearance - alert, well appearing, and in no distress.  Mild audible wheezes heard Mental status - alert, oriented to person, place, and time, normal mood, behavior, speech, dress, motor activity, and thought processes Nose - mucosal congestion and clear rhinorrhea Mouth - mucous membranes moist, pharynx normal without lesions Neck - supple, no significant adenopathy Chest - mild diffuse expiratory wheezing noted. Good Air entry. No crackles or rhonchi's Heart - normal rate, regular rhythm, normal S1, S2, no murmurs, rubs, clicks or gallops  Results for orders placed or performed in visit on 05/10/17  POCT glucose  (manual entry)  Result Value Ref Range   POC Glucose 136 (A) 70 - 99 mg/dl    ASSESSMENT AND PLAN: 1. Mild intermittent asthma with exacerbation 2. Viral upper respiratory tract infection -Patient given to albuterol nebulizer treatments here in the office. Still had mild wheezing but less than prior to treatment. -Given prednisone taper. -Advised to use albuterol metered-dose inhaler every 3-4 hours as needed. -Advised to use Claritin and Flonase to help decrease nasal congestion -Follow up if no improvement or if symptoms worsen - albuterol (PROVENTIL) (2.5 MG/3ML) 0.083% nebulizer solution 2.5 mg; Take 3 mLs (2.5 mg total) by nebulization  once. - benzonatate (TESSALON) 100 MG capsule; Take 1 capsule (100 mg total) by mouth 2 (two) times daily as needed for cough.  Dispense: 20 capsule; Refill: 0 - predniSONE (DELTASONE) 20 MG tablet; 1.5 tab PO daily x 2 days then 1 tab x 2 days then 1/2 tab x 2 days  Dispense: 6 tablet; Refill: 0 - albuterol (PROVENTIL) (2.5 MG/3ML) 0.083% nebulizer solution 2.5 mg; Take 3 mLs (2.5 mg total) by nebulization once.  3. Postoperative hypothyroidism -recheck TSH on next visit.  4. Uncontrolled type 2 diabetes mellitus with stage 3 chronic kidney disease, without long-term current use of insulin (HCC) Prescription given for meter and strips to get at our pharmacy which may be cheaper for her. Continue current medications - POCT glucose (manual entry)  5. Essential hypertension, benign At goal  6. Need for vaccination against Streptococcus pneumoniae using pneumococcal conjugate vaccine 13 - Pneumococcal conjugate vaccine 13-valent   Patient was given the opportunity to ask questions.  Patient verbalized understanding of the plan and was able to repeat key elements of the plan.   Orders Placed This Encounter  Procedures  . Pneumococcal conjugate vaccine 13-valent  . POCT glucose (manual entry)     Requested Prescriptions   Signed  Prescriptions Disp Refills  . benzonatate (TESSALON) 100 MG capsule 20 capsule 0    Sig: Take 1 capsule (100 mg total) by mouth 2 (two) times daily as needed for cough.  . predniSONE (DELTASONE) 20 MG tablet 6 tablet 0    Sig: 1.5 tab PO daily x 2 days then 1 tab x 2 days then 1/2 tab x 2 days  . Blood Glucose Monitoring Suppl (TRUE METRIX METER) w/Device KIT 1 kit 0    Sig: Use as directed  . glucose blood (TRUE METRIX BLOOD GLUCOSE TEST) test strip 100 each 12    Sig: Use as instructed  . TRUEPLUS LANCETS 28G MISC 100 each 1    Sig: Use as directed    Return in about 3 months (around 08/10/2017).  Karle Plumber, MD, FACP

## 2017-05-10 NOTE — Telephone Encounter (Signed)
Pt was seen in the office today.  

## 2017-05-10 NOTE — Patient Instructions (Addendum)
Use your Albuterol inhaler every 3-4 hours as needed. Take the Prednisone as prescribe. Expect some elevation in blood sugar while taking.  Take the Claritin and use the Flonase nasal spray that you have at home. You can use Vicks Vapor Rub or Coricidin HBP to help decrease congestion.  Pneumococcal Conjugate Vaccine (PCV13) What You Need to Know 1. Why get vaccinated? Vaccination can protect both children and adults from pneumococcal disease. Pneumococcal disease is caused by bacteria that can spread from person to person through close contact. It can cause ear infections, and it can also lead to more serious infections of the:  Lungs (pneumonia),  Blood (bacteremia), and  Covering of the brain and spinal cord (meningitis).  Pneumococcal pneumonia is most common among adults. Pneumococcal meningitis can cause deafness and brain damage, and it kills about 1 child in 10 who get it. Anyone can get pneumococcal disease, but children under 31 years of age and adults 33 years and older, people with certain medical conditions, and cigarette smokers are at the highest risk. Before there was a vaccine, the [place] saw:  more than 700 cases of meningitis,  about 13,000 blood infections,  about 5 million ear infections, and  about 200 deaths  in children under 5 each year from pneumococcal disease. Since vaccine became available, severe pneumococcal disease in these children has fallen by 88%. About 18,000 older adults die of pneumococcal disease each year in the [place]. Treatment of pneumococcal infections with penicillin and other drugs is not as effective as it used to be, because some strains of the disease have become resistant to these drugs. This makes prevention of the disease, through vaccination, even more important. 2. PCV13 vaccine Pneumococcal conjugate vaccine (called PCV13) protects against 13 types of pneumococcal bacteria. PCV13 is routinely given to children at 2,  4, 6, and 61-50 months of age. It is also recommended for children and adults 83 to 60 years of age with certain health conditions, and for all adults 70 years of age and older. Your doctor can give you details. 3. Some people should not get this vaccine Anyone who has ever had a life-threatening allergic reaction to a dose of this vaccine, to an earlier pneumococcal vaccine called PCV7, or to any vaccine containing diphtheria toxoid (for example, DTaP), should not get PCV13. Anyone with a severe allergy to any component of PCV13 should not get the vaccine. Tell your doctor if the person being vaccinated has any severe allergies. If the person scheduled for vaccination is not feeling well, your healthcare provider might decide to reschedule the shot on another day. 4. Risks of a vaccine reaction With any medicine, including vaccines, there is a chance of reactions. These are usually mild and go away on their own, but serious reactions are also possible. Problems reported following PCV13 varied by age and dose in the series. The most common problems reported among children were:  About half became drowsy after the shot, had a temporary loss of appetite, or had redness or tenderness where the shot was given.  About 1 out of 3 had swelling where the shot was given.  About 1 out of 3 had a mild fever, and about 1 in 20 had a fever over 102.62F.  Up to about 8 out of 10 became fussy or irritable.  Adults have reported pain, redness, and swelling where the shot was given; also mild fever, fatigue, headache, chills, or muscle pain. Young children who get PCV13 along with  inactivated flu vaccine at the same time may be at increased risk for seizures caused by fever. Ask your doctor for more information. Problems that could happen after any vaccine:  People sometimes faint after a medical procedure, including vaccination. Sitting or lying down for about 15 minutes can help prevent fainting, and injuries  caused by a fall. Tell your doctor if you feel dizzy, or have vision changes or ringing in the ears.  Some older children and adults get severe pain in the shoulder and have difficulty moving the arm where a shot was given. This happens very rarely.  Any medication can cause a severe allergic reaction. Such reactions from a vaccine are very rare, estimated at about 1 in a million doses, and would happen within a few minutes to a few hours after the vaccination. As with any medicine, there is a very small chance of a vaccine causing a serious injury or death. The safety of vaccines is always being monitored. For more information, visit: http://www.aguilar.org/ 5. What if there is a serious reaction? What should I look for? Look for anything that concerns you, such as signs of a severe allergic reaction, very high fever, or unusual behavior. Signs of a severe allergic reaction can include hives, swelling of the face and throat, difficulty breathing, a fast heartbeat, dizziness, and weakness-usually within a few minutes to a few hours after the vaccination. What should I do?  If you think it is a severe allergic reaction or other emergency that can't wait, call 9-1-1 or get the person to the nearest hospital. Otherwise, call your doctor.  Reactions should be reported to the Vaccine Adverse Event Reporting System (VAERS). Your doctor should file this report, or you can do it yourself through the VAERS web site at www.vaers.SamedayNews.es, or by calling 314 288 9111. ? VAERS does not give medical advice. 6. The National Vaccine Injury Compensation Program The [employer] Vaccine Injury Compensation Program (VICP) is a federal program that was created to compensate people who may have been injured by certain vaccines. Persons who believe they may have been injured by a vaccine can learn about the program and about filing a claim by calling 2242776732 or visiting the [place] website at  GoldCloset.com.ee. There is a time limit to file a claim for compensation. 7. How can I learn more?  Ask your healthcare provider. He or she can give you the vaccine package insert or suggest other sources of information.  Call your local or state health department.  Contact the Centers for Disease Control and Prevention (CDC): ? Call 217 736 3524 (1-800-CDC-INFO) or ? Visit CDC's website at http://hunter.com/ Vaccine Information Statement, PCV13 Vaccine (07/23/2014) This information is not intended to replace advice given to you by your health care provider. Make sure you discuss any questions you have with your health care provider. Document Released: 07/02/2006 Document Revised: 05/25/2016 Document Reviewed: 05/25/2016 Elsevier Interactive Patient Education  2017 [employer].

## 2017-05-15 ENCOUNTER — Other Ambulatory Visit: Payer: Self-pay | Admitting: Pharmacist

## 2017-05-15 DIAGNOSIS — IMO0002 Reserved for concepts with insufficient information to code with codable children: Secondary | ICD-10-CM

## 2017-05-15 DIAGNOSIS — N183 Chronic kidney disease, stage 3 unspecified: Secondary | ICD-10-CM

## 2017-05-15 DIAGNOSIS — E890 Postprocedural hypothyroidism: Secondary | ICD-10-CM

## 2017-05-15 DIAGNOSIS — E1165 Type 2 diabetes mellitus with hyperglycemia: Principal | ICD-10-CM

## 2017-05-15 DIAGNOSIS — E1122 Type 2 diabetes mellitus with diabetic chronic kidney disease: Secondary | ICD-10-CM

## 2017-05-15 MED ORDER — GLIPIZIDE 10 MG PO TABS: 10.0000 mg | ORAL_TABLET | Freq: Two times a day (BID) | ORAL | 0 refills | Status: DC

## 2017-05-15 MED ORDER — SITAGLIPTIN PHOSPHATE 50 MG PO TABS: 50.0000 mg | ORAL_TABLET | Freq: Every day | ORAL | 0 refills | Status: DC

## 2017-05-15 MED ORDER — LEVOTHYROXINE SODIUM 88 MCG PO TABS: 88.0000 ug | ORAL_TABLET | Freq: Every day | ORAL | 0 refills | Status: DC

## 2017-05-17 ENCOUNTER — Ambulatory Visit: Payer: Medicare HMO | Admitting: Internal Medicine

## 2017-05-22 ENCOUNTER — Other Ambulatory Visit: Payer: Self-pay | Admitting: Pharmacist

## 2017-05-22 MED ORDER — ASPIRIN 81 MG PO TABS: 81.0000 mg | ORAL_TABLET | Freq: Every day | ORAL | 0 refills | Status: DC

## 2017-06-05 DIAGNOSIS — Z9842 Cataract extraction status, left eye: Secondary | ICD-10-CM | POA: Diagnosis not present

## 2017-06-05 DIAGNOSIS — Z961 Presence of intraocular lens: Secondary | ICD-10-CM | POA: Diagnosis not present

## 2017-06-05 DIAGNOSIS — H401111 Primary open-angle glaucoma, right eye, mild stage: Secondary | ICD-10-CM | POA: Diagnosis not present

## 2017-06-05 DIAGNOSIS — H18413 Arcus senilis, bilateral: Secondary | ICD-10-CM | POA: Diagnosis not present

## 2017-06-05 DIAGNOSIS — H11423 Conjunctival edema, bilateral: Secondary | ICD-10-CM | POA: Diagnosis not present

## 2017-06-05 DIAGNOSIS — H11153 Pinguecula, bilateral: Secondary | ICD-10-CM | POA: Diagnosis not present

## 2017-06-05 DIAGNOSIS — H401122 Primary open-angle glaucoma, left eye, moderate stage: Secondary | ICD-10-CM | POA: Diagnosis not present

## 2017-06-05 DIAGNOSIS — H04123 Dry eye syndrome of bilateral lacrimal glands: Secondary | ICD-10-CM | POA: Diagnosis not present

## 2017-06-05 DIAGNOSIS — H26491 Other secondary cataract, right eye: Secondary | ICD-10-CM | POA: Diagnosis not present

## 2017-06-13 DIAGNOSIS — M65321 Trigger finger, right index finger: Secondary | ICD-10-CM | POA: Diagnosis not present

## 2017-06-13 DIAGNOSIS — M79641 Pain in right hand: Secondary | ICD-10-CM | POA: Diagnosis not present

## 2017-06-14 ENCOUNTER — Telehealth: Payer: Self-pay | Admitting: Internal Medicine

## 2017-06-14 DIAGNOSIS — I10 Essential (primary) hypertension: Secondary | ICD-10-CM

## 2017-06-14 MED ORDER — LISINOPRIL 20 MG PO TABS
20.0000 mg | ORAL_TABLET | Freq: Every day | ORAL | 0 refills | Status: DC
Start: 2017-06-14 — End: 2017-06-15

## 2017-06-14 MED ORDER — LUBIPROSTONE 24 MCG PO CAPS: 24.0000 ug | ORAL_CAPSULE | Freq: Every day | ORAL | 0 refills | Status: DC

## 2017-06-14 MED ORDER — HYDROCHLOROTHIAZIDE 12.5 MG PO CAPS: 12.5000 mg | ORAL_CAPSULE | Freq: Every day | ORAL | 0 refills | Status: DC

## 2017-06-14 NOTE — Telephone Encounter (Signed)
Patient called requesting medication refill on hydrochlorothiazide (MICROZIDE) 12.5 MG capsule, lisinopril (PRINIVIL,ZESTRIL) 20 MG tablet,lubiprostone (AMITIZA) 24 MCG capsule Please f/up

## 2017-06-14 NOTE — Telephone Encounter (Signed)
Requested medications refilled 

## 2017-06-15 ENCOUNTER — Telehealth: Payer: Self-pay | Admitting: Internal Medicine

## 2017-06-15 DIAGNOSIS — I10 Essential (primary) hypertension: Secondary | ICD-10-CM

## 2017-06-15 MED ORDER — LUBIPROSTONE 24 MCG PO CAPS: 24.0000 ug | ORAL_CAPSULE | Freq: Every day | ORAL | 0 refills | Status: DC

## 2017-06-15 MED ORDER — LISINOPRIL 20 MG PO TABS: 20.0000 mg | ORAL_TABLET | Freq: Every day | ORAL | 0 refills | Status: DC

## 2017-06-15 MED ORDER — HYDROCHLOROTHIAZIDE 12.5 MG PO CAPS: 12.5000 mg | ORAL_CAPSULE | Freq: Every day | ORAL | 0 refills | Status: DC

## 2017-06-15 NOTE — Telephone Encounter (Signed)
Medications resent

## 2017-06-15 NOTE — Telephone Encounter (Signed)
Pt. Called stating that [place] had not received her medications. Please f/u with pt.

## 2017-07-16 ENCOUNTER — Telehealth: Payer: Self-pay | Admitting: Internal Medicine

## 2017-07-16 DIAGNOSIS — E890 Postprocedural hypothyroidism: Secondary | ICD-10-CM

## 2017-07-16 MED ORDER — LEVOTHYROXINE SODIUM 88 MCG PO TABS
88.0000 ug | ORAL_TABLET | Freq: Every day | ORAL | 0 refills | Status: DC
Start: 2017-07-16 — End: 2017-07-30

## 2017-07-16 NOTE — Telephone Encounter (Signed)
Refilled

## 2017-07-16 NOTE — Telephone Encounter (Signed)
Pt. Called requesting a refill on levothyroxine (SYNTHROID, LEVOTHROID) 88 MCG tablet  Pt. Would like Rx sent to [facility] on [address].  Please f/u

## 2017-07-30 ENCOUNTER — Ambulatory Visit: Payer: Medicare HMO | Attending: Internal Medicine | Admitting: Internal Medicine

## 2017-07-30 ENCOUNTER — Encounter: Payer: Self-pay | Admitting: Internal Medicine

## 2017-07-30 VITALS — BP 127/76 | HR 61 | Temp 98.2°F | Resp 16 | Wt 262.6 lb

## 2017-07-30 DIAGNOSIS — R053 Chronic cough: Secondary | ICD-10-CM

## 2017-07-30 DIAGNOSIS — Z79899 Other long term (current) drug therapy: Secondary | ICD-10-CM | POA: Diagnosis not present

## 2017-07-30 DIAGNOSIS — Z9889 Other specified postprocedural states: Secondary | ICD-10-CM | POA: Insufficient documentation

## 2017-07-30 DIAGNOSIS — Z880 Allergy status to penicillin: Secondary | ICD-10-CM | POA: Diagnosis not present

## 2017-07-30 DIAGNOSIS — I129 Hypertensive chronic kidney disease with stage 1 through stage 4 chronic kidney disease, or unspecified chronic kidney disease: Secondary | ICD-10-CM | POA: Insufficient documentation

## 2017-07-30 DIAGNOSIS — E1122 Type 2 diabetes mellitus with diabetic chronic kidney disease: Secondary | ICD-10-CM

## 2017-07-30 DIAGNOSIS — J4541 Moderate persistent asthma with (acute) exacerbation: Secondary | ICD-10-CM | POA: Insufficient documentation

## 2017-07-30 DIAGNOSIS — E890 Postprocedural hypothyroidism: Secondary | ICD-10-CM | POA: Diagnosis not present

## 2017-07-30 DIAGNOSIS — Z87891 Personal history of nicotine dependence: Secondary | ICD-10-CM | POA: Diagnosis not present

## 2017-07-30 DIAGNOSIS — N183 Chronic kidney disease, stage 3 unspecified: Secondary | ICD-10-CM

## 2017-07-30 DIAGNOSIS — Z90710 Acquired absence of both cervix and uterus: Secondary | ICD-10-CM | POA: Diagnosis not present

## 2017-07-30 DIAGNOSIS — IMO0002 Reserved for concepts with insufficient information to code with codable children: Secondary | ICD-10-CM

## 2017-07-30 DIAGNOSIS — Z96651 Presence of right artificial knee joint: Secondary | ICD-10-CM | POA: Insufficient documentation

## 2017-07-30 DIAGNOSIS — E1142 Type 2 diabetes mellitus with diabetic polyneuropathy: Secondary | ICD-10-CM

## 2017-07-30 DIAGNOSIS — J4540 Moderate persistent asthma, uncomplicated: Secondary | ICD-10-CM

## 2017-07-30 DIAGNOSIS — I10 Essential (primary) hypertension: Secondary | ICD-10-CM | POA: Diagnosis not present

## 2017-07-30 DIAGNOSIS — R05 Cough: Secondary | ICD-10-CM | POA: Diagnosis not present

## 2017-07-30 DIAGNOSIS — E1165 Type 2 diabetes mellitus with hyperglycemia: Secondary | ICD-10-CM

## 2017-07-30 LAB — POCT GLYCOSYLATED HEMOGLOBIN (HGB A1C): Hemoglobin A1C: 8

## 2017-07-30 LAB — GLUCOSE, POCT (MANUAL RESULT ENTRY): POC Glucose: 203 mg/dl — AB (ref 70–99)

## 2017-07-30 MED ORDER — HYDROCHLOROTHIAZIDE 12.5 MG PO CAPS: 12.5000 mg | ORAL_CAPSULE | Freq: Every day | ORAL | 0 refills | Status: DC

## 2017-07-30 MED ORDER — LISINOPRIL 20 MG PO TABS: 20.0000 mg | ORAL_TABLET | Freq: Every day | ORAL | 0 refills | Status: DC

## 2017-07-30 MED ORDER — LEVOTHYROXINE SODIUM 88 MCG PO TABS: 88.0000 ug | ORAL_TABLET | Freq: Every day | ORAL | 0 refills | Status: DC

## 2017-07-30 MED ORDER — GLIPIZIDE 10 MG PO TABS: 10.0000 mg | ORAL_TABLET | Freq: Two times a day (BID) | ORAL | 0 refills | Status: DC

## 2017-07-30 MED ORDER — GABAPENTIN 100 MG PO CAPS: 200.0000 mg | ORAL_CAPSULE | Freq: Every day | ORAL | 6 refills | Status: DC

## 2017-07-30 MED ORDER — GLUCOSE BLOOD VI STRP: ORAL_STRIP | 12 refills | Status: DC

## 2017-07-30 MED ORDER — LOSARTAN POTASSIUM 50 MG PO TABS: 50.0000 mg | ORAL_TABLET | Freq: Every day | ORAL | 3 refills | Status: DC

## 2017-07-30 MED ORDER — BUDESONIDE-FORMOTEROL FUMARATE 80-4.5 MCG/ACT IN AERO: 2.0000 | INHALATION_SPRAY | Freq: Two times a day (BID) | RESPIRATORY_TRACT | 6 refills | Status: DC

## 2017-07-30 MED ORDER — SITAGLIPTIN PHOSPHATE 100 MG PO TABS: 100.0000 mg | ORAL_TABLET | Freq: Every day | ORAL | 11 refills | Status: DC

## 2017-07-30 MED ORDER — ASPIRIN 81 MG PO TABS: 81.0000 mg | ORAL_TABLET | Freq: Every day | ORAL | 0 refills | Status: AC

## 2017-07-30 NOTE — Progress Notes (Signed)
Patient ID: Kayla Bean, female    DOB: [birth year]  MRN: [id number]  CC: Cough; Medication Refill; and Tingling (left foot)   Subjective: Kayla Bean is a 61 y.o. female who presents for chronic ds management.  Last seen 04/2017 Her concerns today include:  Patient with history of diabetes, hypertension, HL, hypothyroidism, asthma, CKD stage III, and obesity.  1. C/o tingling and numbness under the toes and lateral sole x 1 mth. Also tingling in RT big toe and tingling/numbness in both hands about same time.  -no injury to foot -tingling in hands when writing or using hand for other activities like cooking, peeling or holding objects.   2. DM: checking BS 2 x a day; before BF and bedtime -gives rang  125-140. High today because she took cough syrup -eating more bread, potatoes and fries from [employer] lately -compliant with Glucotrol and Januvia  3.  On last visit patient complained of cough and congestion that had started 4 days before her visit. -She was noted to have mild diffuse expiratory wheezes with good air entry. She was assessed to have mild intermittent asthma with exacerbation and viral upper respiratory tract infection. -Was given albuterol treatment in the office, prednisone taper, albuterol and encouraged to use Claritin and Flonase to help decrease nasal congestion -cough went away but came back x 2 wks. Using diabetic tussin -dry cough with tickle at back of throat.  No wheezing but using Albuterol MDI twice a day for past 2 wks  4. Hypothyroid: compliant with Levothyroxine. No hot/cold intolerance  5. HTN: compliant with med - HCTZ and Cozaar  Had flu shot 05/2017 at [facility] Patient Active Problem List   Diagnosis Date Noted  . Hyperlipidemia 03/22/2017  . Lymphadenopathy 05/23/2013  . CKD stage 3 due to type 2 diabetes mellitus ([place]) 03/19/2013  . Cholelithiasis 12/24/2012  . LIPOMA 07/26/2010  . SEBACEOUS CYST 11/19/2009  . CONSTIPATION, CHRONIC  01/18/2009  . EUSTACHIAN TUBE DYSFUNCTION, RIGHT 10/16/2008  . LOM 09/01/2008  . Essential hypertension 08/18/2008  . Osteoarthritis of left knee 04/23/2008  . PLANTAR FASCIITIS, RIGHT 04/23/2008  . Hypothyroidism 10/15/2007  . Diabetes type 2, uncontrolled ([place]) 09/04/2007  . ASTHMA 09/04/2007     Current Outpatient Medications on File Prior to Visit  Medication Sig Dispense Refill  . Albuterol Sulfate (PROAIR RESPICLICK) 419 (90 Base) MCG/ACT AEPB Inhale 2 puffs into the lungs every 6 (six) hours as needed (for breathing). 1 each 12  . benzonatate (TESSALON) 100 MG capsule Take 1 capsule (100 mg total) by mouth 2 (two) times daily as needed for cough. 20 capsule 0  . Blood Glucose Monitoring Suppl (TRUE METRIX METER) w/Device KIT Use as directed 1 kit 0  . brimonidine-timolol (COMBIGAN) 0.2-0.5 % ophthalmic solution Place 1 drop into both eyes every 12 (twelve) hours.    . diclofenac sodium (VOLTAREN) 1 % GEL Apply 1 application topically 4 (four) times daily. (Patient not taking: Reported on 02/05/2017) 100 g 0  . Lancet Devices (ACCU-CHEK SOFTCLIX) lancets Use as instructed for 3 times daily testing of blood glucose 1 each 0  . loratadine (CLARITIN) 10 MG tablet Take 1 tablet (10 mg total) by mouth daily. 30 tablet 11  . lubiprostone (AMITIZA) 24 MCG capsule Take 1 capsule (24 mcg total) by mouth daily with breakfast. 90 capsule 0  . ondansetron (ZOFRAN ODT) 4 MG disintegrating tablet Take 1 tablet (4 mg total) by mouth 2 (two) times daily as needed for nausea. 30 tablet  0  . pravastatin (PRAVACHOL) 20 MG tablet Take 1 tablet (20 mg total) by mouth daily. 90 tablet 3  . predniSONE (DELTASONE) 20 MG tablet 1.5 tab PO daily x 2 days then 1 tab x 2 days then 1/2 tab x 2 days 6 tablet 0  . TRUEPLUS LANCETS 28G MISC Use as directed 100 each 1   No current facility-administered medications on file prior to visit.     Allergies  Allergen Reactions  . Latex Hives  . Metformin And Related       Hair loss  . Penicillins Hives  . Warfarin Sodium Hives    REACTION: Got rash when took one pill of coumadin  related to knee replacement surgery in 2007.  . Iodinated Diagnostic Agents Other (See Comments)    PT STATES SHE HAD AN ALLERGIC REACTION OF BLISTERS ON HANDS AND FEET 2 DAYS AFTER CT SCAN W/ IV CONTRAST INJECTION, DR. MANSELL SUGGESTS 13 HR PREP//A.C.    Social History   Socioeconomic History  . Marital status: Single    Spouse name: Not on file  . Number of children: Not on file  . Years of education: Not on file  . Highest education level: Not on file  Social Needs  . Financial resource strain: Not on file  . Food insecurity - worry: Not on file  . Food insecurity - inability: Not on file  . Transportation needs - medical: Not on file  . Transportation needs - non-medical: Not on file  Occupational History  . Not on file  Tobacco Use  . Smoking status: Former Smoker    Last attempt to quit: 09/18/2001    Years since quitting: 15.8  . Smokeless tobacco: Never Used  Substance and Sexual Activity  . Alcohol use: Yes  . Drug use: No  . Sexual activity: Not on file  Other Topics Concern  . Not on file  Social History Narrative  . Not on file    No family history on file.  Past Surgical History:  Procedure Laterality Date  . ABDOMINAL HYSTERECTOMY    . KNEE SURGERY     left  . THYROIDECTOMY    . TOTAL KNEE ARTHROPLASTY     right knee    ROS: Review of Systems Negative except as stated above PHYSICAL EXAM: BP 127/76   Pulse 61   Temp 98.2 F (36.8 C) (Oral)   Resp 16   Wt 262 lb 9.6 oz (119.1 kg)   SpO2 98%   BMI 48.03 kg/m   Wt Readings from Last 3 Encounters:  07/30/17 262 lb 9.6 oz (119.1 kg)  05/10/17 258 lb 9.6 oz (117.3 kg)  03/22/17 246 lb 6.4 oz (111.8 kg)   Physical Exam  General appearance - alert, well appearing, and in no distress Mental status - alert, oriented to person, place, and time, normal mood, behavior, speech, dress,  motor activity, and thought processes Mouth - mucous membranes moist, pharynx normal without lesions Neck - supple, no significant adenopathy Chest - mild scattered wheezing Heart - normal rate, regular rhythm, normal S1, S2, no murmurs, rubs, clicks or gallops Extremities - peripheral pulses normal, no pedal edema, no clubbing or cyanosis Diabetic Foot Exam - Simple   Simple Foot Form Visual Inspection No deformities, no ulcerations, no other skin breakdown bilaterally:  Yes Sensation Testing See comments:  Yes Pulse Check Posterior Tibialis and Dorsalis pulse intact bilaterally:  Yes Comments Leap: abnormal on soles of feet. No callous.  Results for orders placed or performed in visit on 07/30/17  POCT glucose (manual entry)  Result Value Ref Range   POC Glucose 203 (A) 70 - 99 mg/dl  POCT glycosylated hemoglobin (Hb A1C)  Result Value Ref Range   Hemoglobin A1C 8.0      Chemistry      Component Value Date/Time   NA 135 02/05/2017 0549   K 3.6 02/05/2017 0549   CL 101 02/05/2017 0549   CO2 23 02/05/2017 0549   BUN 17 02/05/2017 0549   CREATININE 1.08 (H) 02/05/2017 0549   CREATININE 1.29 (H) 07/19/2016 1105      Component Value Date/Time   CALCIUM 8.9 02/05/2017 0549   ALKPHOS 109 02/05/2017 0549   AST 16 02/05/2017 0549   ALT 14 02/05/2017 0549   BILITOT 0.7 02/05/2017 0549     Lab Results  Component Value Date   WBC 14.1 (H) 02/05/2017   HGB 13.7 02/05/2017   HCT 39.7 02/05/2017   MCV 88.6 02/05/2017   PLT 275 02/05/2017    ASSESSMENT AND PLAN: 1. Uncontrolled type 2 diabetes mellitus with stage 3 chronic kidney disease, without long-term current use of insulin ([place]) -Discussed importance of healthy eating habits and regular exercise. Increase Januvia to 100 mg daily - glucose blood (TRUE METRIX BLOOD GLUCOSE TEST) test strip; Use as instructed  Dispense: 100 each; Refill: 12 - sitaGLIPtin (JANUVIA) 100 MG tablet; Take 1 tablet (100 mg total) daily  by mouth.  Dispense: 30 tablet; Refill: 11 - glipiZIDE (GLUCOTROL) 10 MG tablet; Take 1 tablet (10 mg total) 2 (two) times daily before a meal by mouth.  Dispense: 90 tablet; Refill: 0 - aspirin 81 MG tablet; Take 1 tablet (81 mg total) daily by mouth.  Dispense: 90 tablet; Refill: 0  2. Essential hypertension -Change lisinopril to Cozaar due to cough.  Continue HCTZ - hydrochlorothiazide (MICROZIDE) 12.5 MG capsule; Take 1 capsule (12.5 mg total) daily by mouth. Must have office visit for refills  Dispense: 90 capsule; Refill: 0 - losartan (COZAAR) 50 MG tablet; Take 1 tablet (50 mg total) daily by mouth.  Dispense: 90 tablet; Refill: 3 - Basic Metabolic Panel  3. Postoperative hypothyroidism - levothyroxine (SYNTHROID, LEVOTHROID) 88 MCG tablet; Take 1 tablet (88 mcg total) daily by mouth.  Dispense: 90 tablet; Refill: 0 - TSH  4. CKD stage 3 due to type 2 diabetes mellitus ([facility])  - POCT glucose (manual entry) - POCT glycosylated hemoglobin (Hb A1C)  5. Moderate persistent asthma without complication -add maintenance inhaler.  Patient advised to wash mouth with warm water after each use of Symbicort - budesonide-formoterol (SYMBICORT) 80-4.5 MCG/ACT inhaler; Inhale 2 puffs 2 (two) times daily into the lungs.  Dispense: 1 Inhaler; Refill: 6  6. Diabetic polyneuropathy associated with type 2 diabetes mellitus ([place]) Symptoms seem consistent with neuropathy and possible carpal tunnel syndrome. Discussed importance of good diabetes control. -DM foot care discussed Patient willing to be tried with low-dose gabapentin. - gabapentin (NEURONTIN) 100 MG capsule; Take 2 capsules (200 mg total) at bedtime by mouth.  Dispense: 60 capsule; Refill: 6 - Vitamin B12  7. Persistent cough -Seem to be a combination of asthma and perhaps due to ACE inhibitor.  Stop lisinopril.  Change to Cozaar.  Add Symbicort for maintenance inhaler.atient was given the opportunity to ask questions.  Patient verbalized  understanding of the plan and was able to repeat key elements of the plan.   Orders Placed This Encounter  Procedures  . TSH  .  Vitamin B12  . Basic Metabolic Panel  . POCT glucose (manual entry)  . POCT glycosylated hemoglobin (Hb A1C)     Requested Prescriptions   Signed Prescriptions Disp Refills  . glucose blood (TRUE METRIX BLOOD GLUCOSE TEST) test strip 100 each 12    Sig: Use as instructed  . sitaGLIPtin (JANUVIA) 100 MG tablet 30 tablet 11    Sig: Take 1 tablet (100 mg total) daily by mouth.  Marland Kitchen glipiZIDE (GLUCOTROL) 10 MG tablet 90 tablet 0    Sig: Take 1 tablet (10 mg total) 2 (two) times daily before a meal by mouth.  . levothyroxine (SYNTHROID, LEVOTHROID) 88 MCG tablet 90 tablet 0    Sig: Take 1 tablet (88 mcg total) daily by mouth.  . hydrochlorothiazide (MICROZIDE) 12.5 MG capsule 90 capsule 0    Sig: Take 1 capsule (12.5 mg total) daily by mouth. Must have office visit for refills  . aspirin 81 MG tablet 90 tablet 0    Sig: Take 1 tablet (81 mg total) daily by mouth.  . gabapentin (NEURONTIN) 100 MG capsule 60 capsule 6    Sig: Take 2 capsules (200 mg total) at bedtime by mouth.  . losartan (COZAAR) 50 MG tablet 90 tablet 3    Sig: Take 1 tablet (50 mg total) daily by mouth.  . budesonide-formoterol (SYMBICORT) 80-4.5 MCG/ACT inhaler 1 Inhaler 6    Sig: Inhale 2 puffs 2 (two) times daily into the lungs.    Return in about 3 months (around 10/30/2017).  Karle Plumber, MD, FACP

## 2017-07-30 NOTE — Patient Instructions (Signed)
Stop lisinopril as this may be contributing to your chronic cough.  We will use losartan instead.   Start Symbicort metered-dose inhaler.  This will be a maintenance inhaler. Use itt twice a day.  Please wash her mouth with warm water each use.  Your diabetes is not as controlled as we would like it to be.  Please increase Januvia to 100 mg daily.  Please work on cutting back on the white carbohydrates in your diet.

## 2017-07-31 ENCOUNTER — Other Ambulatory Visit: Payer: Self-pay | Admitting: Pharmacist

## 2017-07-31 DIAGNOSIS — IMO0002 Reserved for concepts with insufficient information to code with codable children: Secondary | ICD-10-CM

## 2017-07-31 DIAGNOSIS — E1165 Type 2 diabetes mellitus with hyperglycemia: Principal | ICD-10-CM

## 2017-07-31 DIAGNOSIS — N183 Chronic kidney disease, stage 3 unspecified: Secondary | ICD-10-CM

## 2017-07-31 DIAGNOSIS — E1122 Type 2 diabetes mellitus with diabetic chronic kidney disease: Secondary | ICD-10-CM

## 2017-07-31 LAB — TSH: TSH: 2.52 u[IU]/mL (ref 0.450–4.500)

## 2017-07-31 MED ORDER — GLUCOSE BLOOD VI STRP: ORAL_STRIP | 12 refills | Status: DC

## 2017-07-31 MED ORDER — SITAGLIPTIN PHOSPHATE 100 MG PO TABS: 100.0000 mg | ORAL_TABLET | Freq: Every day | ORAL | 11 refills | Status: DC

## 2017-08-01 ENCOUNTER — Telehealth: Payer: Self-pay

## 2017-08-01 LAB — BASIC METABOLIC PANEL
BUN/Creatinine Ratio: 13 (ref 12–28)
BUN: 14 mg/dL (ref 8–27)
CO2: 22 mmol/L (ref 20–29)
Calcium: 9.4 mg/dL (ref 8.7–10.3)
Chloride: 99 mmol/L (ref 96–106)
Creatinine, Ser: 1.06 mg/dL — ABNORMAL HIGH (ref 0.57–1.00)
GFR calc Af Amer: 65 mL/min/{1.73_m2} (ref >59)
GFR calc non Af Amer: 56 mL/min/{1.73_m2} — ABNORMAL LOW (ref >59)
Glucose: 169 mg/dL — ABNORMAL HIGH (ref 65–99)
Potassium: 4.4 mmol/L (ref 3.5–5.2)
Sodium: 140 mmol/L (ref 134–144)

## 2017-08-01 LAB — SPECIMEN STATUS REPORT

## 2017-08-01 NOTE — Telephone Encounter (Signed)
Contacted pt to go over lab results pt is aware of results. Pt states she will come by tomorrow for blood work

## 2017-08-02 ENCOUNTER — Ambulatory Visit: Payer: Medicare HMO | Attending: Internal Medicine

## 2017-08-02 DIAGNOSIS — E1142 Type 2 diabetes mellitus with diabetic polyneuropathy: Secondary | ICD-10-CM | POA: Diagnosis not present

## 2017-08-02 DIAGNOSIS — I10 Essential (primary) hypertension: Secondary | ICD-10-CM | POA: Diagnosis not present

## 2017-08-02 NOTE — Progress Notes (Signed)
Patient here for lab visit only 

## 2017-08-03 LAB — VITAMIN B12: Vitamin B-12: 439 pg/mL (ref 232–1245)

## 2017-08-06 DIAGNOSIS — M545 Low back pain: Secondary | ICD-10-CM | POA: Diagnosis not present

## 2017-08-11 DIAGNOSIS — M1712 Unilateral primary osteoarthritis, left knee: Secondary | ICD-10-CM | POA: Diagnosis not present

## 2017-08-21 ENCOUNTER — Telehealth: Payer: Self-pay | Admitting: *Deleted

## 2017-08-21 NOTE — Telephone Encounter (Signed)
-----   Message from Ladell Pier, MD sent at 08/03/2017  5:32 PM EST ----- B12 level is good.

## 2017-08-21 NOTE — Telephone Encounter (Signed)
MA unable to reach the patient due to ringing continuously with no voicemail option. !!!Please inform patient of B12 being normal!!!

## 2017-09-09 ENCOUNTER — Emergency Department (HOSPITAL_COMMUNITY): Payer: Medicare HMO

## 2017-09-09 ENCOUNTER — Emergency Department (HOSPITAL_COMMUNITY)
Admission: EM | Admit: 2017-09-09 | Discharge: 2017-09-10 | Disposition: A | Discharge status: Home / Self Care | Payer: Medicare HMO | Attending: Emergency Medicine | Admitting: Emergency Medicine

## 2017-09-09 ENCOUNTER — Other Ambulatory Visit: Payer: Self-pay

## 2017-09-09 DIAGNOSIS — M7918 Myalgia, other site: Secondary | ICD-10-CM | POA: Diagnosis not present

## 2017-09-09 DIAGNOSIS — R109 Unspecified abdominal pain: Secondary | ICD-10-CM | POA: Diagnosis not present

## 2017-09-09 DIAGNOSIS — S299XXA Unspecified injury of thorax, initial encounter: Secondary | ICD-10-CM | POA: Diagnosis not present

## 2017-09-09 DIAGNOSIS — N183 Chronic kidney disease, stage 3 (moderate): Secondary | ICD-10-CM | POA: Insufficient documentation

## 2017-09-09 DIAGNOSIS — K8020 Calculus of gallbladder without cholecystitis without obstruction: Secondary | ICD-10-CM | POA: Insufficient documentation

## 2017-09-09 DIAGNOSIS — E1122 Type 2 diabetes mellitus with diabetic chronic kidney disease: Secondary | ICD-10-CM | POA: Diagnosis not present

## 2017-09-09 DIAGNOSIS — J45909 Unspecified asthma, uncomplicated: Secondary | ICD-10-CM | POA: Diagnosis not present

## 2017-09-09 DIAGNOSIS — S6991XA Unspecified injury of right wrist, hand and finger(s), initial encounter: Secondary | ICD-10-CM | POA: Diagnosis not present

## 2017-09-09 DIAGNOSIS — Y92410 Unspecified street and highway as the place of occurrence of the external cause: Secondary | ICD-10-CM | POA: Insufficient documentation

## 2017-09-09 DIAGNOSIS — Y9389 Activity, other specified: Secondary | ICD-10-CM | POA: Insufficient documentation

## 2017-09-09 DIAGNOSIS — Y998 Other external cause status: Secondary | ICD-10-CM | POA: Insufficient documentation

## 2017-09-09 DIAGNOSIS — I129 Hypertensive chronic kidney disease with stage 1 through stage 4 chronic kidney disease, or unspecified chronic kidney disease: Secondary | ICD-10-CM | POA: Insufficient documentation

## 2017-09-09 DIAGNOSIS — Z91040 Latex allergy status: Secondary | ICD-10-CM | POA: Diagnosis not present

## 2017-09-09 DIAGNOSIS — Z79899 Other long term (current) drug therapy: Secondary | ICD-10-CM | POA: Diagnosis not present

## 2017-09-09 DIAGNOSIS — S3991XA Unspecified injury of abdomen, initial encounter: Secondary | ICD-10-CM | POA: Diagnosis not present

## 2017-09-09 DIAGNOSIS — E039 Hypothyroidism, unspecified: Secondary | ICD-10-CM | POA: Insufficient documentation

## 2017-09-09 DIAGNOSIS — Z96651 Presence of right artificial knee joint: Secondary | ICD-10-CM | POA: Insufficient documentation

## 2017-09-09 DIAGNOSIS — M25561 Pain in right knee: Secondary | ICD-10-CM | POA: Diagnosis not present

## 2017-09-09 DIAGNOSIS — R079 Chest pain, unspecified: Secondary | ICD-10-CM | POA: Insufficient documentation

## 2017-09-09 DIAGNOSIS — S0990XA Unspecified injury of head, initial encounter: Secondary | ICD-10-CM | POA: Diagnosis not present

## 2017-09-09 DIAGNOSIS — S279XXA Injury of unspecified intrathoracic organ, initial encounter: Secondary | ICD-10-CM | POA: Diagnosis not present

## 2017-09-09 DIAGNOSIS — Z7982 Long term (current) use of aspirin: Secondary | ICD-10-CM | POA: Insufficient documentation

## 2017-09-09 DIAGNOSIS — R0781 Pleurodynia: Secondary | ICD-10-CM | POA: Diagnosis not present

## 2017-09-09 DIAGNOSIS — Z87891 Personal history of nicotine dependence: Secondary | ICD-10-CM | POA: Insufficient documentation

## 2017-09-09 DIAGNOSIS — R55 Syncope and collapse: Secondary | ICD-10-CM | POA: Diagnosis present

## 2017-09-09 DIAGNOSIS — S199XXA Unspecified injury of neck, initial encounter: Secondary | ICD-10-CM | POA: Diagnosis not present

## 2017-09-09 MED ORDER — OXYCODONE-ACETAMINOPHEN 5-325 MG PO TABS
1.0000 | ORAL_TABLET | Freq: Once | ORAL | Status: AC
  Administered 2017-09-09: 1 via ORAL
  Filled 2017-09-09: qty 1
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
## 2017-09-09 NOTE — ED Notes (Signed)
Delay in lab draw,   Pt not in room at this time,

## 2017-09-09 NOTE — ED Notes (Signed)
Delay in lab draw,  Pt not in room 

## 2017-09-10 DIAGNOSIS — M25561 Pain in right knee: Secondary | ICD-10-CM | POA: Diagnosis not present

## 2017-09-10 DIAGNOSIS — S6991XA Unspecified injury of right wrist, hand and finger(s), initial encounter: Secondary | ICD-10-CM | POA: Diagnosis not present

## 2017-09-10 DIAGNOSIS — M7918 Myalgia, other site: Secondary | ICD-10-CM | POA: Diagnosis not present

## 2017-09-10 DIAGNOSIS — S0990XA Unspecified injury of head, initial encounter: Secondary | ICD-10-CM | POA: Diagnosis not present

## 2017-09-10 DIAGNOSIS — S199XXA Unspecified injury of neck, initial encounter: Secondary | ICD-10-CM | POA: Diagnosis not present

## 2017-09-10 MED ORDER — MORPHINE SULFATE (PF) 4 MG/ML IV SOLN
4.0000 mg | Freq: Once | INTRAVENOUS | Status: AC
  Administered 2017-09-10: 4 mg via INTRAVENOUS
  Filled 2017-09-10: qty 1

## 2017-09-10 MED ORDER — HYDROCODONE-ACETAMINOPHEN 5-325 MG PO TABS: 1.0000 | ORAL_TABLET | ORAL | 0 refills | Status: DC | PRN

## 2017-09-10 MED ORDER — METHOCARBAMOL 500 MG PO TABS: 500.0000 mg | ORAL_TABLET | Freq: Two times a day (BID) | ORAL | 0 refills | Status: DC
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
## 2017-09-13 ENCOUNTER — Encounter: Payer: Self-pay | Admitting: Internal Medicine

## 2017-09-13 ENCOUNTER — Ambulatory Visit: Payer: Medicare HMO | Attending: Internal Medicine | Admitting: Internal Medicine

## 2017-09-13 VITALS — BP 132/87 | HR 91 | Temp 98.5°F | Resp 16

## 2017-09-13 DIAGNOSIS — I129 Hypertensive chronic kidney disease with stage 1 through stage 4 chronic kidney disease, or unspecified chronic kidney disease: Secondary | ICD-10-CM | POA: Diagnosis not present

## 2017-09-13 DIAGNOSIS — Z79899 Other long term (current) drug therapy: Secondary | ICD-10-CM | POA: Insufficient documentation

## 2017-09-13 DIAGNOSIS — Z90710 Acquired absence of both cervix and uterus: Secondary | ICD-10-CM | POA: Insufficient documentation

## 2017-09-13 DIAGNOSIS — Z96651 Presence of right artificial knee joint: Secondary | ICD-10-CM | POA: Insufficient documentation

## 2017-09-13 DIAGNOSIS — Z91041 Radiographic dye allergy status: Secondary | ICD-10-CM | POA: Insufficient documentation

## 2017-09-13 DIAGNOSIS — M79641 Pain in right hand: Secondary | ICD-10-CM | POA: Diagnosis not present

## 2017-09-13 DIAGNOSIS — Z7984 Long term (current) use of oral hypoglycemic drugs: Secondary | ICD-10-CM | POA: Diagnosis not present

## 2017-09-13 DIAGNOSIS — I10 Essential (primary) hypertension: Secondary | ICD-10-CM | POA: Diagnosis present

## 2017-09-13 DIAGNOSIS — E785 Hyperlipidemia, unspecified: Secondary | ICD-10-CM | POA: Diagnosis not present

## 2017-09-13 DIAGNOSIS — E039 Hypothyroidism, unspecified: Secondary | ICD-10-CM | POA: Diagnosis not present

## 2017-09-13 DIAGNOSIS — E1122 Type 2 diabetes mellitus with diabetic chronic kidney disease: Secondary | ICD-10-CM | POA: Diagnosis not present

## 2017-09-13 DIAGNOSIS — J45909 Unspecified asthma, uncomplicated: Secondary | ICD-10-CM | POA: Insufficient documentation

## 2017-09-13 DIAGNOSIS — Z888 Allergy status to other drugs, medicaments and biological substances status: Secondary | ICD-10-CM | POA: Diagnosis not present

## 2017-09-13 DIAGNOSIS — S134XXA Sprain of ligaments of cervical spine, initial encounter: Secondary | ICD-10-CM

## 2017-09-13 DIAGNOSIS — E669 Obesity, unspecified: Secondary | ICD-10-CM | POA: Diagnosis not present

## 2017-09-13 DIAGNOSIS — M25562 Pain in left knee: Secondary | ICD-10-CM | POA: Insufficient documentation

## 2017-09-13 DIAGNOSIS — Z880 Allergy status to penicillin: Secondary | ICD-10-CM | POA: Insufficient documentation

## 2017-09-13 DIAGNOSIS — Z7951 Long term (current) use of inhaled steroids: Secondary | ICD-10-CM | POA: Insufficient documentation

## 2017-09-13 DIAGNOSIS — N183 Chronic kidney disease, stage 3 unspecified: Secondary | ICD-10-CM

## 2017-09-13 DIAGNOSIS — E1142 Type 2 diabetes mellitus with diabetic polyneuropathy: Secondary | ICD-10-CM | POA: Insufficient documentation

## 2017-09-13 DIAGNOSIS — Z79890 Hormone replacement therapy (postmenopausal): Secondary | ICD-10-CM | POA: Diagnosis not present

## 2017-09-13 DIAGNOSIS — G44309 Post-traumatic headache, unspecified, not intractable: Secondary | ICD-10-CM | POA: Insufficient documentation

## 2017-09-13 DIAGNOSIS — S20211A Contusion of right front wall of thorax, initial encounter: Secondary | ICD-10-CM | POA: Diagnosis not present

## 2017-09-13 DIAGNOSIS — Z91040 Latex allergy status: Secondary | ICD-10-CM | POA: Insufficient documentation

## 2017-09-13 DIAGNOSIS — Z7982 Long term (current) use of aspirin: Secondary | ICD-10-CM | POA: Diagnosis not present

## 2017-09-13 DIAGNOSIS — Z87891 Personal history of nicotine dependence: Secondary | ICD-10-CM | POA: Diagnosis not present

## 2017-09-13 DIAGNOSIS — M25561 Pain in right knee: Secondary | ICD-10-CM | POA: Insufficient documentation

## 2017-09-13 LAB — GLUCOSE, POCT (MANUAL RESULT ENTRY): POC Glucose: 194 mg/dl — AB (ref 70–99)

## 2017-09-13 MED ORDER — TRAMADOL HCL 50 MG PO TABS: 50.0000 mg | ORAL_TABLET | Freq: Three times a day (TID) | ORAL | 0 refills | Status: DC | PRN
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
## 2017-09-13 NOTE — Patient Instructions (Signed)
Use a heating pad for your back and chest. When she was done taking the Norco you can use the tramadol as needed for pain. I have referred you to orthopedics for your knees.

## 2017-09-14 ENCOUNTER — Ambulatory Visit (HOSPITAL_COMMUNITY)
Admission: RE | Admit: 2017-09-14 | Discharge: 2017-09-14 | Disposition: A | Discharge status: Home / Self Care | Payer: No Typology Code available for payment source | Source: Ambulatory Visit | Attending: Internal Medicine | Admitting: Internal Medicine

## 2017-09-14 DIAGNOSIS — M7989 Other specified soft tissue disorders: Secondary | ICD-10-CM | POA: Diagnosis not present

## 2017-09-14 DIAGNOSIS — H401122 Primary open-angle glaucoma, left eye, moderate stage: Secondary | ICD-10-CM | POA: Diagnosis not present

## 2017-09-14 DIAGNOSIS — M25561 Pain in right knee: Secondary | ICD-10-CM | POA: Diagnosis not present

## 2017-09-14 DIAGNOSIS — M1712 Unilateral primary osteoarthritis, left knee: Secondary | ICD-10-CM | POA: Insufficient documentation

## 2017-09-14 DIAGNOSIS — S0500XA Injury of conjunctiva and corneal abrasion without foreign body, unspecified eye, initial encounter: Secondary | ICD-10-CM | POA: Diagnosis not present

## 2017-09-14 DIAGNOSIS — H401111 Primary open-angle glaucoma, right eye, mild stage: Secondary | ICD-10-CM | POA: Diagnosis not present

## 2017-09-14 DIAGNOSIS — H04123 Dry eye syndrome of bilateral lacrimal glands: Secondary | ICD-10-CM | POA: Diagnosis not present

## 2017-09-14 DIAGNOSIS — M25562 Pain in left knee: Secondary | ICD-10-CM | POA: Insufficient documentation

## 2017-09-14 DIAGNOSIS — H11423 Conjunctival edema, bilateral: Secondary | ICD-10-CM | POA: Diagnosis not present

## 2017-09-14 DIAGNOSIS — S8992XA Unspecified injury of left lower leg, initial encounter: Secondary | ICD-10-CM | POA: Diagnosis not present

## 2017-09-14 DIAGNOSIS — Z961 Presence of intraocular lens: Secondary | ICD-10-CM | POA: Diagnosis not present

## 2017-09-14 DIAGNOSIS — H11153 Pinguecula, bilateral: Secondary | ICD-10-CM | POA: Diagnosis not present

## 2017-09-17 ENCOUNTER — Telehealth: Payer: Self-pay

## 2017-09-17 NOTE — Telephone Encounter (Signed)
Contacted pt to go over xray results pt is aware of results pt states she is starting to have pain in her abdomen. Please f/u

## 2017-09-25 ENCOUNTER — Telehealth (INDEPENDENT_AMBULATORY_CARE_PROVIDER_SITE_OTHER): Payer: Self-pay | Admitting: Internal Medicine

## 2017-09-25 DIAGNOSIS — S134XXA Sprain of ligaments of cervical spine, initial encounter: Secondary | ICD-10-CM

## 2017-09-25 NOTE — Telephone Encounter (Signed)
Pt call to request a referral for physical therapy, since according to her it was told that she need it,please follow up

## 2017-09-25 NOTE — Telephone Encounter (Signed)
She want to a Sport therapy, please follow yp with the referral

## 2017-09-25 NOTE — Telephone Encounter (Signed)
Attempted to reach pt this evening to discuss her request. I need clarification. I got a voicemail box.  I did not leave message.  Is she requesting referral to P.T for her neck or is she requesting to see ortho - Sports Medicine.  Will have CMA reach out to her.

## 2017-09-28 NOTE — Telephone Encounter (Signed)
Kayla Bean pt hasn't heard from ortho yet would be able to if they would be able to see her for her neck as well

## 2017-09-28 NOTE — Telephone Encounter (Signed)
Contacted pt and pt states she would like to go to [facility] the doctor is Ulla Gallo, MD. She would like to see him for her knees, legs and neck. Pt states she is also having issues with her chest and breast on the right side and she is wanting to know who would she see for this?

## 2017-10-03 DIAGNOSIS — M4722 Other spondylosis with radiculopathy, cervical region: Secondary | ICD-10-CM | POA: Diagnosis not present

## 2017-10-03 DIAGNOSIS — M25562 Pain in left knee: Secondary | ICD-10-CM | POA: Diagnosis not present

## 2017-10-03 DIAGNOSIS — M25561 Pain in right knee: Secondary | ICD-10-CM | POA: Diagnosis not present

## 2017-10-09 DIAGNOSIS — M1712 Unilateral primary osteoarthritis, left knee: Secondary | ICD-10-CM | POA: Diagnosis not present

## 2017-10-09 DIAGNOSIS — M25562 Pain in left knee: Secondary | ICD-10-CM | POA: Diagnosis not present

## 2017-10-09 DIAGNOSIS — M546 Pain in thoracic spine: Secondary | ICD-10-CM | POA: Diagnosis not present

## 2017-10-09 DIAGNOSIS — M25561 Pain in right knee: Secondary | ICD-10-CM | POA: Diagnosis not present

## 2017-10-15 ENCOUNTER — Telehealth: Payer: Self-pay | Admitting: Internal Medicine

## 2017-10-15 DIAGNOSIS — IMO0002 Reserved for concepts with insufficient information to code with codable children: Secondary | ICD-10-CM

## 2017-10-15 DIAGNOSIS — E1122 Type 2 diabetes mellitus with diabetic chronic kidney disease: Secondary | ICD-10-CM

## 2017-10-15 DIAGNOSIS — E1165 Type 2 diabetes mellitus with hyperglycemia: Secondary | ICD-10-CM

## 2017-10-15 DIAGNOSIS — N183 Chronic kidney disease, stage 3 unspecified: Secondary | ICD-10-CM

## 2017-10-15 DIAGNOSIS — I10 Essential (primary) hypertension: Secondary | ICD-10-CM

## 2017-10-15 DIAGNOSIS — E890 Postprocedural hypothyroidism: Secondary | ICD-10-CM

## 2017-10-15 MED ORDER — SITAGLIPTIN PHOSPHATE 100 MG PO TABS: 100.0000 mg | ORAL_TABLET | Freq: Every day | ORAL | 0 refills | Status: DC

## 2017-10-15 MED ORDER — LUBIPROSTONE 24 MCG PO CAPS: 24.0000 ug | ORAL_CAPSULE | Freq: Every day | ORAL | 0 refills | Status: DC

## 2017-10-15 MED ORDER — LEVOTHYROXINE SODIUM 88 MCG PO TABS: 88.0000 ug | ORAL_TABLET | Freq: Every day | ORAL | 0 refills | Status: DC

## 2017-10-15 MED ORDER — HYDROCHLOROTHIAZIDE 12.5 MG PO CAPS: 12.5000 mg | ORAL_CAPSULE | Freq: Every day | ORAL | 0 refills | Status: DC

## 2017-10-15 MED ORDER — LOSARTAN POTASSIUM 50 MG PO TABS: 50.0000 mg | ORAL_TABLET | Freq: Every day | ORAL | 0 refills | Status: DC

## 2017-10-15 NOTE — Telephone Encounter (Signed)
Pt. Called requesting a refill on the following medications:   hydrochlorothiazide (MICROZIDE) 12.5 MG capsule levothyroxine (SYNTHROID, LEVOTHROID) 88 MCG tablet sitaGLIPtin (JANUVIA) 100 MG tablet lubiprostone (AMITIZA) 24 MCG capsule  Lisinopril  Pt. Uses [facility] pharmacy on [address].  Please f/u

## 2017-10-15 NOTE — Telephone Encounter (Signed)
Refilled. Patient on losartan and not lisinopril so refilled losartan.

## 2017-10-16 DIAGNOSIS — M25561 Pain in right knee: Secondary | ICD-10-CM | POA: Diagnosis not present

## 2017-10-16 DIAGNOSIS — M25562 Pain in left knee: Secondary | ICD-10-CM | POA: Diagnosis not present

## 2017-10-16 DIAGNOSIS — M546 Pain in thoracic spine: Secondary | ICD-10-CM | POA: Diagnosis not present

## 2017-10-16 DIAGNOSIS — M1712 Unilateral primary osteoarthritis, left knee: Secondary | ICD-10-CM | POA: Diagnosis not present

## 2017-10-16 NOTE — Telephone Encounter (Signed)
09/25/17  Sent referral to  [facility] ph# [phone] address [place] .They will contact the patient to schedule an appointment

## 2017-10-17 ENCOUNTER — Telehealth: Payer: Self-pay | Admitting: Internal Medicine

## 2017-10-17 DIAGNOSIS — M1712 Unilateral primary osteoarthritis, left knee: Secondary | ICD-10-CM | POA: Diagnosis not present

## 2017-10-17 NOTE — Telephone Encounter (Signed)
Patient called and requested for a refill on medication Lisinopril 20mg . Please fu

## 2017-10-17 NOTE — Telephone Encounter (Signed)
Patient isn't on lisinopril. It was changed to losartan due to cough.   Called patient and she didn't know it was changed. She will go to the pharmacy and pick up the losartan.

## 2017-10-18 ENCOUNTER — Telehealth: Payer: Self-pay | Admitting: Pharmacist

## 2017-10-18 NOTE — Telephone Encounter (Signed)
Fax received from pharmacy for [place] for [place]. Amitiza is no longer covered by [facility] as of 09/18/17. The preferred is Linzess or Movantik. Will forward to PCP

## 2017-10-19 MED ORDER — LINACLOTIDE 72 MCG PO CAPS: 72.0000 ug | ORAL_CAPSULE | Freq: Every day | ORAL | 2 refills | Status: DC

## 2017-10-19 NOTE — Addendum Note (Signed)
Addended by: Charlott Rakes on: 10/19/2017 04:38 PM   Modules accepted: Orders

## 2017-10-19 NOTE — Telephone Encounter (Signed)
Switched to [employer]

## 2017-10-23 DIAGNOSIS — M25561 Pain in right knee: Secondary | ICD-10-CM | POA: Diagnosis not present

## 2017-10-23 DIAGNOSIS — M546 Pain in thoracic spine: Secondary | ICD-10-CM | POA: Diagnosis not present

## 2017-10-23 DIAGNOSIS — M1712 Unilateral primary osteoarthritis, left knee: Secondary | ICD-10-CM | POA: Diagnosis not present

## 2017-10-23 DIAGNOSIS — M25562 Pain in left knee: Secondary | ICD-10-CM | POA: Diagnosis not present

## 2017-10-26 ENCOUNTER — Other Ambulatory Visit: Payer: Self-pay | Admitting: Internal Medicine

## 2017-10-26 DIAGNOSIS — Z1231 Encounter for screening mammogram for malignant neoplasm of breast: Secondary | ICD-10-CM

## 2017-10-30 DIAGNOSIS — M25561 Pain in right knee: Secondary | ICD-10-CM | POA: Diagnosis not present

## 2017-10-30 DIAGNOSIS — M1712 Unilateral primary osteoarthritis, left knee: Secondary | ICD-10-CM | POA: Diagnosis not present

## 2017-10-30 DIAGNOSIS — M25562 Pain in left knee: Secondary | ICD-10-CM | POA: Diagnosis not present

## 2017-10-30 DIAGNOSIS — M546 Pain in thoracic spine: Secondary | ICD-10-CM | POA: Diagnosis not present

## 2017-11-13 DIAGNOSIS — M25562 Pain in left knee: Secondary | ICD-10-CM | POA: Diagnosis not present

## 2017-11-13 DIAGNOSIS — M1712 Unilateral primary osteoarthritis, left knee: Secondary | ICD-10-CM | POA: Diagnosis not present

## 2017-11-13 DIAGNOSIS — M25561 Pain in right knee: Secondary | ICD-10-CM | POA: Diagnosis not present

## 2017-11-13 DIAGNOSIS — M546 Pain in thoracic spine: Secondary | ICD-10-CM | POA: Diagnosis not present

## 2017-11-20 ENCOUNTER — Telehealth: Payer: Self-pay | Admitting: Internal Medicine

## 2017-11-20 DIAGNOSIS — M546 Pain in thoracic spine: Secondary | ICD-10-CM | POA: Diagnosis not present

## 2017-11-20 DIAGNOSIS — M25561 Pain in right knee: Secondary | ICD-10-CM | POA: Diagnosis not present

## 2017-11-20 DIAGNOSIS — M25562 Pain in left knee: Secondary | ICD-10-CM | POA: Diagnosis not present

## 2017-11-20 DIAGNOSIS — M1712 Unilateral primary osteoarthritis, left knee: Secondary | ICD-10-CM | POA: Diagnosis not present

## 2017-11-20 NOTE — Telephone Encounter (Signed)
Patient called requesting for a call back regarding her losartan. Patient stated she seen a commercial that losartan has cancer causing ingredients. Please fu at you earliest convenience.

## 2017-11-20 NOTE — Telephone Encounter (Signed)
There is a recall on losartan but it is only specific lot numbers that have a possible carcinogen in them. Patient would have to contact pharmacy to determine if she is affected by the recall.

## 2017-11-20 NOTE — Telephone Encounter (Signed)
Will forward to pcp

## 2017-11-27 DIAGNOSIS — M1712 Unilateral primary osteoarthritis, left knee: Secondary | ICD-10-CM | POA: Diagnosis not present

## 2017-11-27 DIAGNOSIS — M25561 Pain in right knee: Secondary | ICD-10-CM | POA: Diagnosis not present

## 2017-11-27 DIAGNOSIS — M25562 Pain in left knee: Secondary | ICD-10-CM | POA: Diagnosis not present

## 2017-11-27 DIAGNOSIS — M546 Pain in thoracic spine: Secondary | ICD-10-CM | POA: Diagnosis not present

## 2017-11-28 DIAGNOSIS — M4722 Other spondylosis with radiculopathy, cervical region: Secondary | ICD-10-CM | POA: Diagnosis not present

## 2017-11-28 DIAGNOSIS — M1712 Unilateral primary osteoarthritis, left knee: Secondary | ICD-10-CM | POA: Diagnosis not present

## 2017-11-29 NOTE — Telephone Encounter (Signed)
Dr. Wynetta Emery would you be able to place the order for Diagnostic MM

## 2017-11-29 NOTE — Telephone Encounter (Signed)
Patient called about a diagnostic mammogram to be sent please call patient back at [phone]

## 2017-12-03 ENCOUNTER — Telehealth: Payer: Self-pay | Admitting: Internal Medicine

## 2017-12-03 DIAGNOSIS — I10 Essential (primary) hypertension: Secondary | ICD-10-CM

## 2017-12-03 DIAGNOSIS — N644 Mastodynia: Secondary | ICD-10-CM

## 2017-12-03 MED ORDER — VALSARTAN 40 MG PO TABS: 40.0000 mg | ORAL_TABLET | Freq: Every day | ORAL | 6 refills | Status: DC

## 2017-12-03 NOTE — Telephone Encounter (Signed)
Patient called and stated [facility] would no longer dispense amitiza and now they are requesting her to use Linzess.  Losartan, recall letter: is there anything else she could take due to the recall.
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
## 2017-12-03 NOTE — Telephone Encounter (Signed)
Resubmitted referral for Dx MMG RT side.

## 2017-12-04 ENCOUNTER — Other Ambulatory Visit: Payer: Self-pay | Admitting: Internal Medicine

## 2017-12-04 DIAGNOSIS — N644 Mastodynia: Secondary | ICD-10-CM

## 2017-12-04 NOTE — Telephone Encounter (Signed)
Contacted pt and left a detailed vm informing pt that Dr. Wynetta Emery changed losartan to valsartan and if she has any questions or concerns to give me a call

## 2017-12-10 ENCOUNTER — Ambulatory Visit: Payer: Medicare HMO

## 2017-12-10 ENCOUNTER — Ambulatory Visit
Admission: RE | Admit: 2017-12-10 | Discharge: 2017-12-10 | Disposition: A | Discharge status: Home / Self Care | Payer: PRIVATE HEALTH INSURANCE | Source: Ambulatory Visit | Attending: Internal Medicine | Admitting: Internal Medicine

## 2017-12-10 DIAGNOSIS — R928 Other abnormal and inconclusive findings on diagnostic imaging of breast: Secondary | ICD-10-CM | POA: Diagnosis not present

## 2017-12-10 DIAGNOSIS — N644 Mastodynia: Secondary | ICD-10-CM

## 2017-12-11 ENCOUNTER — Telehealth: Payer: Self-pay

## 2017-12-11 NOTE — Telephone Encounter (Signed)
Contacted pt to go over mm results pt is aware and doesn't have any questions or concerns  

## 2017-12-13 DIAGNOSIS — H401122 Primary open-angle glaucoma, left eye, moderate stage: Secondary | ICD-10-CM | POA: Diagnosis not present

## 2017-12-13 DIAGNOSIS — Z961 Presence of intraocular lens: Secondary | ICD-10-CM | POA: Diagnosis not present

## 2017-12-13 DIAGNOSIS — H47233 Glaucomatous optic atrophy, bilateral: Secondary | ICD-10-CM | POA: Diagnosis not present

## 2017-12-13 DIAGNOSIS — H18413 Arcus senilis, bilateral: Secondary | ICD-10-CM | POA: Diagnosis not present

## 2017-12-13 DIAGNOSIS — H11423 Conjunctival edema, bilateral: Secondary | ICD-10-CM | POA: Diagnosis not present

## 2017-12-13 DIAGNOSIS — H401111 Primary open-angle glaucoma, right eye, mild stage: Secondary | ICD-10-CM | POA: Diagnosis not present

## 2017-12-13 DIAGNOSIS — Z9849 Cataract extraction status, unspecified eye: Secondary | ICD-10-CM | POA: Diagnosis not present

## 2017-12-13 DIAGNOSIS — H11153 Pinguecula, bilateral: Secondary | ICD-10-CM | POA: Diagnosis not present

## 2017-12-13 DIAGNOSIS — H04123 Dry eye syndrome of bilateral lacrimal glands: Secondary | ICD-10-CM | POA: Diagnosis not present

## 2017-12-17 ENCOUNTER — Encounter: Payer: Self-pay | Admitting: Internal Medicine

## 2017-12-17 ENCOUNTER — Ambulatory Visit: Payer: Medicare HMO | Attending: Internal Medicine | Admitting: Internal Medicine

## 2017-12-17 VITALS — BP 120/79 | HR 69 | Temp 98.5°F | Resp 16 | Wt 249.6 lb

## 2017-12-17 DIAGNOSIS — Z6841 Body Mass Index (BMI) 40.0 and over, adult: Secondary | ICD-10-CM | POA: Diagnosis not present

## 2017-12-17 DIAGNOSIS — N183 Chronic kidney disease, stage 3 unspecified: Secondary | ICD-10-CM

## 2017-12-17 DIAGNOSIS — Z87891 Personal history of nicotine dependence: Secondary | ICD-10-CM | POA: Diagnosis not present

## 2017-12-17 DIAGNOSIS — Z888 Allergy status to other drugs, medicaments and biological substances status: Secondary | ICD-10-CM | POA: Diagnosis not present

## 2017-12-17 DIAGNOSIS — Z79899 Other long term (current) drug therapy: Secondary | ICD-10-CM | POA: Insufficient documentation

## 2017-12-17 DIAGNOSIS — E039 Hypothyroidism, unspecified: Secondary | ICD-10-CM | POA: Insufficient documentation

## 2017-12-17 DIAGNOSIS — I10 Essential (primary) hypertension: Secondary | ICD-10-CM | POA: Diagnosis not present

## 2017-12-17 DIAGNOSIS — I129 Hypertensive chronic kidney disease with stage 1 through stage 4 chronic kidney disease, or unspecified chronic kidney disease: Secondary | ICD-10-CM | POA: Diagnosis not present

## 2017-12-17 DIAGNOSIS — M25569 Pain in unspecified knee: Secondary | ICD-10-CM | POA: Diagnosis not present

## 2017-12-17 DIAGNOSIS — E1122 Type 2 diabetes mellitus with diabetic chronic kidney disease: Secondary | ICD-10-CM

## 2017-12-17 DIAGNOSIS — Z96651 Presence of right artificial knee joint: Secondary | ICD-10-CM | POA: Insufficient documentation

## 2017-12-17 DIAGNOSIS — Z880 Allergy status to penicillin: Secondary | ICD-10-CM | POA: Diagnosis not present

## 2017-12-17 DIAGNOSIS — Z7951 Long term (current) use of inhaled steroids: Secondary | ICD-10-CM | POA: Diagnosis not present

## 2017-12-17 DIAGNOSIS — E1165 Type 2 diabetes mellitus with hyperglycemia: Secondary | ICD-10-CM | POA: Diagnosis not present

## 2017-12-17 DIAGNOSIS — IMO0002 Reserved for concepts with insufficient information to code with codable children: Secondary | ICD-10-CM

## 2017-12-17 DIAGNOSIS — E1142 Type 2 diabetes mellitus with diabetic polyneuropathy: Secondary | ICD-10-CM | POA: Insufficient documentation

## 2017-12-17 DIAGNOSIS — Z7982 Long term (current) use of aspirin: Secondary | ICD-10-CM | POA: Diagnosis not present

## 2017-12-17 DIAGNOSIS — Z79890 Hormone replacement therapy (postmenopausal): Secondary | ICD-10-CM | POA: Diagnosis not present

## 2017-12-17 DIAGNOSIS — E785 Hyperlipidemia, unspecified: Secondary | ICD-10-CM | POA: Insufficient documentation

## 2017-12-17 DIAGNOSIS — J45909 Unspecified asthma, uncomplicated: Secondary | ICD-10-CM | POA: Diagnosis not present

## 2017-12-17 LAB — GLUCOSE, POCT (MANUAL RESULT ENTRY): POC Glucose: 353 mg/dl — AB (ref 70–99)

## 2017-12-17 LAB — POCT GLYCOSYLATED HEMOGLOBIN (HGB A1C): Hemoglobin A1C: 9.1

## 2017-12-17 MED ORDER — GLIPIZIDE 10 MG PO TABS: 10.0000 mg | ORAL_TABLET | Freq: Two times a day (BID) | ORAL | 3 refills | Status: DC

## 2017-12-17 MED ORDER — DAPAGLIFLOZIN PROPANEDIOL 5 MG PO TABS: 5.0000 mg | ORAL_TABLET | Freq: Every day | ORAL | 6 refills | Status: DC

## 2017-12-17 NOTE — Patient Instructions (Signed)
Diabetes Mellitus and Nutrition When you have diabetes (diabetes mellitus), it is very important to have healthy eating habits because your blood sugar (glucose) levels are greatly affected by what you eat and drink. Eating healthy foods in the appropriate amounts, at about the same times every day, can help you:  Control your blood glucose.  Lower your risk of heart disease.  Improve your blood pressure.  Reach or maintain a healthy weight.  Every person with diabetes is different, and each person has different needs for a meal plan. Your health care provider may recommend that you work with a diet and nutrition specialist (dietitian) to make a meal plan that is best for you. Your meal plan may vary depending on factors such as:  The calories you need.  The medicines you take.  Your weight.  Your blood glucose, blood pressure, and cholesterol levels.  Your activity level.  Other health conditions you have, such as heart or kidney disease.  How do carbohydrates affect me? Carbohydrates affect your blood glucose level more than any other type of food. Eating carbohydrates naturally increases the amount of glucose in your blood. Carbohydrate counting is a method for keeping track of how many carbohydrates you eat. Counting carbohydrates is important to keep your blood glucose at a healthy level, especially if you use insulin or take certain oral diabetes medicines. It is important to know how many carbohydrates you can safely have in each meal. This is different for every person. Your dietitian can help you calculate how many carbohydrates you should have at each meal and for snack. Foods that contain carbohydrates include:  Bread, cereal, rice, pasta, and crackers.  Potatoes and corn.  Peas, beans, and lentils.  Milk and yogurt.  Fruit and juice.  Desserts, such as cakes, cookies, ice cream, and candy.  How does alcohol affect me? Alcohol can cause a sudden decrease in blood  glucose (hypoglycemia), especially if you use insulin or take certain oral diabetes medicines. Hypoglycemia can be a life-threatening condition. Symptoms of hypoglycemia (sleepiness, dizziness, and confusion) are similar to symptoms of having too much alcohol. If your health care provider says that alcohol is safe for you, follow these guidelines:  Limit alcohol intake to no more than 1 drink per day for nonpregnant women and 2 drinks per day for men. One drink equals 12 oz of beer, 5 oz of wine, or 1 oz of hard liquor.  Do not drink on an empty stomach.  Keep yourself hydrated with water, diet soda, or unsweetened iced tea.  Keep in mind that regular soda, juice, and other mixers may contain a lot of sugar and must be counted as carbohydrates.  What are tips for following this plan? Reading food labels  Start by checking the serving size on the label. The amount of calories, carbohydrates, fats, and other nutrients listed on the label are based on one serving of the food. Many foods contain more than one serving per package.  Check the total grams (g) of carbohydrates in one serving. You can calculate the number of servings of carbohydrates in one serving by dividing the total carbohydrates by 15. For example, if a food has 30 g of total carbohydrates, it would be equal to 2 servings of carbohydrates.  Check the number of grams (g) of saturated and trans fats in one serving. Choose foods that have low or no amount of these fats.  Check the number of milligrams (mg) of sodium in one serving. Most people   should limit total sodium intake to less than 2,300 mg per day.  Always check the nutrition information of foods labeled as "low-fat" or "nonfat". These foods may be higher in added sugar or refined carbohydrates and should be avoided.  Talk to your dietitian to identify your daily goals for nutrients listed on the label. Shopping  Avoid buying canned, premade, or processed foods. These  foods tend to be high in fat, sodium, and added sugar.  Shop around the outside edge of the grocery store. This includes fresh fruits and vegetables, bulk grains, fresh meats, and fresh dairy. Cooking  Use low-heat cooking methods, such as baking, instead of high-heat cooking methods like deep frying.  Cook using healthy oils, such as olive, canola, or sunflower oil.  Avoid cooking with butter, cream, or high-fat meats. Meal planning  Eat meals and snacks regularly, preferably at the same times every day. Avoid going long periods of time without eating.  Eat foods high in fiber, such as fresh fruits, vegetables, beans, and whole grains. Talk to your dietitian about how many servings of carbohydrates you can eat at each meal.  Eat 4-6 ounces of lean protein each day, such as lean meat, chicken, fish, eggs, or tofu. 1 ounce is equal to 1 ounce of meat, chicken, or fish, 1 egg, or 1/4 cup of tofu.  Eat some foods each day that contain healthy fats, such as avocado, nuts, seeds, and fish. Lifestyle   Check your blood glucose regularly.  Exercise at least 30 minutes 5 or more days each week, or as told by your health care provider.  Take medicines as told by your health care provider.  Do not use any products that contain nicotine or tobacco, such as cigarettes and e-cigarettes. If you need help quitting, ask your health care provider.  Work with a counselor or diabetes educator to identify strategies to manage stress and any emotional and social challenges. What are some questions to ask my health care provider?  Do I need to meet with a diabetes educator?  Do I need to meet with a dietitian?  What number can I call if I have questions?  When are the best times to check my blood glucose? Where to find more information:  American Diabetes Association: diabetes.org/food-and-fitness/food  Academy of Nutrition and Dietetics:  www.eatright.org/resources/health/diseases-and-conditions/diabetes  National Institute of Diabetes and Digestive and Kidney Diseases (NIH): www.niddk.nih.gov/health-information/diabetes/overview/diet-eating-physical-activity Summary  A healthy meal plan will help you control your blood glucose and maintain a healthy lifestyle.  Working with a diet and nutrition specialist (dietitian) can help you make a meal plan that is best for you.  Keep in mind that carbohydrates and alcohol have immediate effects on your blood glucose levels. It is important to count carbohydrates and to use alcohol carefully. This information is not intended to replace advice given to you by your health care provider. Make sure you discuss any questions you have with your health care provider. Document Released: 06/01/2005 Document Revised: 10/09/2016 Document Reviewed: 10/09/2016 Elsevier Interactive Patient Education  2018 Elsevier Inc.  
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
## 2018-01-02 ENCOUNTER — Telehealth: Payer: Self-pay

## 2018-01-02 NOTE — Telephone Encounter (Signed)
Pt contacted the office and stated since last week she has been having severe vaginal irritation, vaginal itching, burning when urinating and her vagina is swollen. Pt states she has noticed since she has been on the valsartan these symptoms has started. Pt states she has been on this medicine since 12/04/2017. Pt states she stopped the medicine yesterday and since then her symptoms has started to ease  Please f/u

## 2018-01-02 NOTE — Telephone Encounter (Signed)
Contacted Kayla Bean and inform her Dr. Wynetta Emery response. Kayla Bean is scheduled for 01/03/2018 to see Levada Dy tomorrow at 310pm. Kayla Bean states she will stick with the valsartan to see if her symptoms resides after being treated

## 2018-01-03 ENCOUNTER — Ambulatory Visit: Payer: Medicare HMO | Attending: Internal Medicine | Admitting: Physician Assistant

## 2018-01-03 ENCOUNTER — Other Ambulatory Visit (HOSPITAL_COMMUNITY)
Admission: RE | Admit: 2018-01-03 | Discharge: 2018-01-03 | Disposition: A | Discharge status: Home / Self Care | Payer: Medicare HMO | Source: Ambulatory Visit | Attending: Internal Medicine | Admitting: Internal Medicine

## 2018-01-03 VITALS — BP 133/84 | HR 59 | Temp 99.0°F | Resp 16 | Ht 62.0 in | Wt 248.2 lb

## 2018-01-03 DIAGNOSIS — R300 Dysuria: Secondary | ICD-10-CM | POA: Insufficient documentation

## 2018-01-03 DIAGNOSIS — Z91041 Radiographic dye allergy status: Secondary | ICD-10-CM | POA: Insufficient documentation

## 2018-01-03 DIAGNOSIS — I10 Essential (primary) hypertension: Secondary | ICD-10-CM | POA: Diagnosis not present

## 2018-01-03 DIAGNOSIS — M1990 Unspecified osteoarthritis, unspecified site: Secondary | ICD-10-CM | POA: Insufficient documentation

## 2018-01-03 DIAGNOSIS — Z79890 Hormone replacement therapy (postmenopausal): Secondary | ICD-10-CM | POA: Insufficient documentation

## 2018-01-03 DIAGNOSIS — Z888 Allergy status to other drugs, medicaments and biological substances status: Secondary | ICD-10-CM | POA: Insufficient documentation

## 2018-01-03 DIAGNOSIS — Z7982 Long term (current) use of aspirin: Secondary | ICD-10-CM | POA: Diagnosis not present

## 2018-01-03 DIAGNOSIS — Z880 Allergy status to penicillin: Secondary | ICD-10-CM | POA: Diagnosis not present

## 2018-01-03 DIAGNOSIS — Z79899 Other long term (current) drug therapy: Secondary | ICD-10-CM | POA: Diagnosis not present

## 2018-01-03 DIAGNOSIS — E039 Hypothyroidism, unspecified: Secondary | ICD-10-CM | POA: Insufficient documentation

## 2018-01-03 DIAGNOSIS — N183 Chronic kidney disease, stage 3 unspecified: Secondary | ICD-10-CM

## 2018-01-03 DIAGNOSIS — E890 Postprocedural hypothyroidism: Secondary | ICD-10-CM | POA: Diagnosis not present

## 2018-01-03 DIAGNOSIS — E1122 Type 2 diabetes mellitus with diabetic chronic kidney disease: Secondary | ICD-10-CM

## 2018-01-03 DIAGNOSIS — Z7984 Long term (current) use of oral hypoglycemic drugs: Secondary | ICD-10-CM | POA: Diagnosis not present

## 2018-01-03 DIAGNOSIS — Z91040 Latex allergy status: Secondary | ICD-10-CM | POA: Insufficient documentation

## 2018-01-03 DIAGNOSIS — IMO0002 Reserved for concepts with insufficient information to code with codable children: Secondary | ICD-10-CM

## 2018-01-03 DIAGNOSIS — J45909 Unspecified asthma, uncomplicated: Secondary | ICD-10-CM | POA: Diagnosis not present

## 2018-01-03 DIAGNOSIS — E1165 Type 2 diabetes mellitus with hyperglycemia: Secondary | ICD-10-CM | POA: Diagnosis not present

## 2018-01-03 LAB — POCT URINALYSIS DIPSTICK
Bilirubin, UA: NEGATIVE
Glucose, UA: NEGATIVE
Ketones, UA: NEGATIVE
Leukocytes, UA: NEGATIVE
Nitrite, UA: NEGATIVE
Spec Grav, UA: 1.02 (ref 1.010–1.025)
Urobilinogen, UA: 1 E.U./dL
pH, UA: 5.5 (ref 5.0–8.0)

## 2018-01-03 LAB — GLUCOSE, POCT (MANUAL RESULT ENTRY): POC Glucose: 122 mg/dl — AB (ref 70–99)

## 2018-01-03 MED ORDER — LISINOPRIL 20 MG PO TABS
20.0000 mg | ORAL_TABLET | Freq: Every day | ORAL | 3 refills | Status: DC
Start: 2018-01-03 — End: 2019-04-25

## 2018-01-03 MED ORDER — SITAGLIPTIN PHOSPHATE 100 MG PO TABS
100.0000 mg | ORAL_TABLET | Freq: Every day | ORAL | 0 refills | Status: DC
Start: 2018-01-03 — End: 2018-03-08

## 2018-01-03 MED ORDER — LEVOTHYROXINE SODIUM 88 MCG PO TABS: 88.0000 ug | ORAL_TABLET | Freq: Every day | ORAL | 0 refills | Status: DC

## 2018-01-03 MED ORDER — HYDROCHLOROTHIAZIDE 12.5 MG PO CAPS: 12.5000 mg | ORAL_CAPSULE | Freq: Every day | ORAL | 0 refills | Status: DC

## 2018-01-03 NOTE — Patient Instructions (Signed)
Stop Valsratan.  Start Lisinopril 20mg  daily.  Check your blood pressure daily and record and bring to your next office visit.

## 2018-01-03 NOTE — Progress Notes (Signed)
Pt. Stated she is having vaginal itching and creamy vaginal discharges.  Pt. Stated she thinks the medication Valsartan is causing her to have vaginal problem.  Pt. Stated she stopped taking it for two days and she see the improvements.

## 2018-01-03 NOTE — Progress Notes (Signed)
Patient ID: Kayla Bean, female   DOB: 02/06/1956, 62 y.o.   MRN: [id number]   Kayla Bean, is a 62 y.o. female  VPC:[id number]  [id number]  DOB - 1956-01-12  Subjective:  Chief Complaint and HPI: Kayla Bean is a 62 y.o. female here today with concerns about taking Valsartan.  She does not want to be on this as she has been seeing commercials about ARBs causing problems and hearing about recalls.  She has taken and tolerated lisinopril in the past.  She also feels that she has been having some dysuria and vaginal itching and feels this is being caused by the valsartan.  It improved when she stopped the medication for 2 days.   No STI risk factors.  ROS:   Constitutional:  No f/c, No night sweats, No unexplained weight loss. EENT:  No vision changes, No blurry vision, No hearing changes. No mouth, throat, or ear problems.  Respiratory: No cough, No SOB Cardiac: No CP, no palpitations GI:  No abd pain, No N/V/D. GU: some dysuria and mild vaginal itching Musculoskeletal: No joint pain Neuro: No headache, no dizziness, no motor weakness.  Skin: No rash Endocrine:  No polydipsia. No polyuria.  Psych: Denies SI/HI  No problems updated.  ALLERGIES: Allergies  Allergen Reactions  . Latex Hives  . Metformin And Related     Hair loss  . Penicillins Hives  . Warfarin Sodium Hives    REACTION: Got rash when took one pill of coumadin  related to knee replacement surgery in 2007.  . Iodinated Diagnostic Agents Other (See Comments)    PT STATES SHE HAD AN ALLERGIC REACTION OF BLISTERS ON HANDS AND FEET 2 DAYS AFTER CT SCAN W/ IV CONTRAST INJECTION, DR. MANSELL SUGGESTS 13 HR PREP//A.C.    PAST MEDICAL HISTORY: Past Medical History:  Diagnosis Date  . Arthritis   . Asthma   . Diabetes mellitus   . Heart murmur   . Hemorrhoids   . Hypertension   . Thyroid disease    hypothyroidism    MEDICATIONS AT HOME: Prior to Admission medications   Medication Sig Start  Date End Date Taking? Authorizing Provider  Albuterol Sulfate (PROAIR RESPICLICK) 446 (90 Base) MCG/ACT AEPB Inhale 2 puffs into the lungs every 6 (six) hours as needed (for breathing). 03/22/17  Yes Ladell Pier, MD  aspirin 81 MG tablet Take 1 tablet (81 mg total) daily by mouth. 07/30/17  Yes Ladell Pier, MD  Blood Glucose Monitoring Suppl (TRUE METRIX METER) w/Device KIT Use as directed 05/10/17  Yes Ladell Pier, MD  brimonidine-timolol (COMBIGAN) 0.2-0.5 % ophthalmic solution Place 1 drop into both eyes every 12 (twelve) hours.   Yes [provider]  dapagliflozin propanediol (FARXIGA) 5 MG TABS tablet Take 5 mg by mouth daily. 12/17/17  Yes Ladell Pier, MD  glipiZIDE (GLUCOTROL) 10 MG tablet Take 1 tablet (10 mg total) by mouth 2 (two) times daily before a meal. 12/17/17  Yes Ladell Pier, MD  glucose blood (TRUE METRIX BLOOD GLUCOSE TEST) test strip Use as instructed to check blood sugar 3 times daily. E11.9 07/31/17  Yes Ladell Pier, MD  hydrochlorothiazide (MICROZIDE) 12.5 MG capsule Take 1 capsule (12.5 mg total) by mouth daily. Must have office visit for refills 10/15/17  Yes Ladell Pier, MD  Lancet Devices [facility]) lancets Use as instructed for 3 times daily testing of blood glucose 08/09/16  Yes Langeland, Leda Quail, MD  levothyroxine ([place]) 88  MCG tablet Take 1 tablet (88 mcg total) by mouth daily. 10/15/17  Yes Ladell Pier, MD  linaclotide [facility]) 72 MCG capsule Take 1 capsule (72 mcg total) by mouth daily before breakfast. 10/19/17  Yes Newlin, Enobong, MD  pravastatin (PRAVACHOL) 20 MG tablet Take 1 tablet (20 mg total) by mouth daily. 03/22/17  Yes Ladell Pier, MD  sitaGLIPtin (JANUVIA) 100 MG tablet Take 1 tablet (100 mg total) by mouth daily. 10/15/17  Yes Ladell Pier, MD  TRUEPLUS LANCETS 28G MISC Use as directed 05/10/17  Yes Ladell Pier, MD  benzonatate (TESSALON) 100 MG capsule Take  1 capsule (100 mg total) by mouth 2 (two) times daily as needed for cough. Patient not taking: Reported on 01/03/2018 05/10/17   Ladell Pier, MD  budesonide-formoterol [facility]) 80-4.5 MCG/ACT inhaler Inhale 2 puffs 2 (two) times daily into the lungs. Patient not taking: Reported on 09/09/2017 07/30/17   Ladell Pier, MD  cycloSPORINE (RESTASIS) 0.05 % ophthalmic emulsion Place 1 drop into both eyes 2 (two) times daily.    [provider]  gabapentin (NEURONTIN) 100 MG capsule Take 2 capsules (200 mg total) at bedtime by mouth. Patient not taking: Reported on 09/09/2017 07/30/17   Ladell Pier, MD  HYDROcodone-acetaminophen (NORCO/VICODIN) 5-325 MG tablet Take 1-2 tablets by mouth every 4 (four) hours as needed. Patient not taking: Reported on 01/03/2018 09/10/17   Charlann Lange, PA-C  latanoprost (XALATAN) 0.005 % ophthalmic solution Place 1 drop into the left eye at bedtime.    [provider]  lisinopril (PRINIVIL,ZESTRIL) 20 MG tablet Take 1 tablet (20 mg total) by mouth daily. 01/03/18   Argentina Donovan, PA-C  methocarbamol (ROBAXIN) 500 MG tablet Take 1 tablet (500 mg total) by mouth 2 (two) times daily. Patient not taking: Reported on 01/03/2018 09/10/17   Charlann Lange, PA-C     Objective:  EXAM:   Vitals:   01/03/18 1537  BP: 133/84  Pulse: (!) 59  Resp: 16  Temp: 99 F (37.2 C)  TempSrc: Oral  SpO2: 96%  Weight: 248 lb 3.2 oz (112.6 kg)  Height: _0  (1.575 m)    General appearance : A&OX3. NAD. Non-toxic-appearing HEENT: Atraumatic and Normocephalic.  PERRLA. EOM intact.  Neck: supple, no JVD. No cervical lymphadenopathy. No thyromegaly Chest/Lungs:  Breathing-non-labored, Good air entry bilaterally, breath sounds normal without rales, rhonchi, or wheezing  CVS: S1 S2 regular, no murmurs, gallops, rubs  Extremities: Bilateral Lower Ext shows no edema, both legs are warm to touch with = pulse throughout Neurology:  CN II-XII  grossly intact, Non focal.   Psych:  TP linear. J/I WNL. Normal speech. Appropriate eye contact and affect.  Skin:  No Rash  Data Review Lab Results  Component Value Date   HGBA1C 9.1 12/17/2017   HGBA1C 8.0 07/30/2017   HGBA1C 8.2 03/22/2017     Assessment & Plan   1. Dysuria UA dip shows no infection-await ancillary - Urinalysis Dipstick - Urine cytology ancillary only  2. Uncontrolled type 2 diabetes mellitus with hyperglycemia (HCC) Continue current regimen-blood sugar was good today.   - Glucose (CBG)  3. Essential hypertension At goal but will switch medications as she does not want to take Valsartan.  Stop Valsartan.  Check BP daily and record and bring to next visit.   - lisinopril (PRINIVIL,ZESTRIL) 20 MG tablet; Take 1 tablet (20 mg total) by mouth daily.  Dispense: 90 tablet; Refill: 3   Patient have been counseled  extensively about nutrition and exercise  Return in about 3 weeks (around 01/24/2018) for Dr Johnson-recheck BP.  The patient was given clear instructions to go to ER or return to medical center if symptoms don't improve, worsen or new problems develop. The patient verbalized understanding. The patient was told to call to get lab results if they haven't heard anything in the next week.     Freeman Caldron, PA-C [facility] and [place], [place]   01/03/2018, 3:57 PM

## 2018-01-04 LAB — URINE CYTOLOGY ANCILLARY ONLY
Chlamydia: NEGATIVE
Neisseria Gonorrhea: NEGATIVE
Trichomonas: NEGATIVE

## 2018-01-08 ENCOUNTER — Telehealth: Payer: Self-pay | Admitting: Internal Medicine

## 2018-01-08 ENCOUNTER — Telehealth: Payer: Self-pay

## 2018-01-08 LAB — URINE CYTOLOGY ANCILLARY ONLY
Bacterial vaginitis: NEGATIVE
Candida vaginitis: NEGATIVE

## 2018-01-08 NOTE — Telephone Encounter (Signed)
Patient was informed of STD results. Patient then asked why was she still itching. Please fu at your earliest convenience.

## 2018-01-08 NOTE — Telephone Encounter (Signed)
Will route to Provider.

## 2018-01-08 NOTE — Telephone Encounter (Signed)
-----   Message from Argentina Donovan, [place] sent at 01/06/2018  2:44 PM EDT ----- Please call patient.  Her STD testing was negative. Thanks, Freeman Caldron, PA-C

## 2018-01-08 NOTE — Telephone Encounter (Signed)
CMA attempt to call patient regarding lab results.  No answer and left a VM for patient to call back.  If patient call back, please inform:  Please call patient.  Her STD testing was negative. Thanks, Freeman Caldron, PA-C

## 2018-01-09 NOTE — Telephone Encounter (Signed)
-----   Message from Argentina Donovan, [place] sent at 01/09/2018  8:07 AM EDT ----- Please call patient.  I am unsure why she is still itching.  None of her tests showed anything to treat.  There was no yeast or bacterial vaginosis or STD. Follow-up if needed.  Thanks, Freeman Caldron, PA-C

## 2018-01-09 NOTE — Telephone Encounter (Signed)
CMA spoke to patient to inform provider advising. Patient understood.  Patient stated she thinks it may be due to the New medication her PCP gave her.

## 2018-01-22 ENCOUNTER — Other Ambulatory Visit: Payer: Self-pay

## 2018-01-22 ENCOUNTER — Telehealth: Payer: Self-pay | Admitting: Internal Medicine

## 2018-01-22 DIAGNOSIS — E890 Postprocedural hypothyroidism: Secondary | ICD-10-CM

## 2018-01-22 NOTE — Telephone Encounter (Signed)
Contacted pt and pt stated she doesn't need refill

## 2018-01-22 NOTE — Telephone Encounter (Signed)
Patient called and requested for listed medication to be refilled:  levothyroxine (SYNTHROID, LEVOTHROID) 88 MCG tablet [[id number]]   [facility] [address]

## 2018-01-29 ENCOUNTER — Other Ambulatory Visit: Payer: Self-pay

## 2018-01-29 DIAGNOSIS — E1122 Type 2 diabetes mellitus with diabetic chronic kidney disease: Secondary | ICD-10-CM

## 2018-01-29 DIAGNOSIS — IMO0002 Reserved for concepts with insufficient information to code with codable children: Secondary | ICD-10-CM

## 2018-01-29 DIAGNOSIS — N183 Chronic kidney disease, stage 3 unspecified: Secondary | ICD-10-CM

## 2018-01-29 DIAGNOSIS — E1165 Type 2 diabetes mellitus with hyperglycemia: Principal | ICD-10-CM

## 2018-01-29 MED ORDER — DAPAGLIFLOZIN PROPANEDIOL 5 MG PO TABS: 5.0000 mg | ORAL_TABLET | Freq: Every day | ORAL | 1 refills | Status: DC

## 2018-02-07 ENCOUNTER — Ambulatory Visit: Payer: Medicare HMO | Admitting: Internal Medicine

## 2018-03-08 ENCOUNTER — Encounter

## 2018-03-08 ENCOUNTER — Encounter: Payer: Self-pay | Admitting: Internal Medicine

## 2018-03-08 ENCOUNTER — Ambulatory Visit: Payer: Medicare HMO | Attending: Internal Medicine | Admitting: Internal Medicine

## 2018-03-08 VITALS — BP 147/84 | HR 53 | Temp 97.8°F | Ht 62.0 in | Wt 239.4 lb

## 2018-03-08 DIAGNOSIS — E890 Postprocedural hypothyroidism: Secondary | ICD-10-CM | POA: Diagnosis not present

## 2018-03-08 DIAGNOSIS — I129 Hypertensive chronic kidney disease with stage 1 through stage 4 chronic kidney disease, or unspecified chronic kidney disease: Secondary | ICD-10-CM | POA: Insufficient documentation

## 2018-03-08 DIAGNOSIS — E785 Hyperlipidemia, unspecified: Secondary | ICD-10-CM | POA: Diagnosis not present

## 2018-03-08 DIAGNOSIS — I10 Essential (primary) hypertension: Secondary | ICD-10-CM | POA: Diagnosis not present

## 2018-03-08 DIAGNOSIS — E1142 Type 2 diabetes mellitus with diabetic polyneuropathy: Secondary | ICD-10-CM | POA: Diagnosis not present

## 2018-03-08 DIAGNOSIS — N760 Acute vaginitis: Secondary | ICD-10-CM | POA: Insufficient documentation

## 2018-03-08 DIAGNOSIS — Z6841 Body Mass Index (BMI) 40.0 and over, adult: Secondary | ICD-10-CM | POA: Insufficient documentation

## 2018-03-08 DIAGNOSIS — E11649 Type 2 diabetes mellitus with hypoglycemia without coma: Secondary | ICD-10-CM | POA: Diagnosis not present

## 2018-03-08 DIAGNOSIS — Z87891 Personal history of nicotine dependence: Secondary | ICD-10-CM | POA: Diagnosis not present

## 2018-03-08 DIAGNOSIS — E119 Type 2 diabetes mellitus without complications: Secondary | ICD-10-CM

## 2018-03-08 DIAGNOSIS — Z79899 Other long term (current) drug therapy: Secondary | ICD-10-CM | POA: Insufficient documentation

## 2018-03-08 DIAGNOSIS — Z7982 Long term (current) use of aspirin: Secondary | ICD-10-CM | POA: Diagnosis not present

## 2018-03-08 DIAGNOSIS — J45909 Unspecified asthma, uncomplicated: Secondary | ICD-10-CM | POA: Diagnosis not present

## 2018-03-08 DIAGNOSIS — Z7984 Long term (current) use of oral hypoglycemic drugs: Secondary | ICD-10-CM | POA: Diagnosis not present

## 2018-03-08 DIAGNOSIS — N183 Chronic kidney disease, stage 3 (moderate): Secondary | ICD-10-CM | POA: Insufficient documentation

## 2018-03-08 DIAGNOSIS — E1122 Type 2 diabetes mellitus with diabetic chronic kidney disease: Secondary | ICD-10-CM | POA: Diagnosis not present

## 2018-03-08 LAB — GLUCOSE, POCT (MANUAL RESULT ENTRY): POC Glucose: 119 mg/dl — AB (ref 70–99)

## 2018-03-08 MED ORDER — SITAGLIPTIN PHOSPHATE 100 MG PO TABS: 100.0000 mg | ORAL_TABLET | Freq: Every day | ORAL | 0 refills | Status: DC

## 2018-03-08 MED ORDER — HYDROCHLOROTHIAZIDE 12.5 MG PO CAPS: 12.5000 mg | ORAL_CAPSULE | Freq: Every day | ORAL | 0 refills | Status: DC

## 2018-03-08 MED ORDER — LEVOTHYROXINE SODIUM 88 MCG PO TABS: 88.0000 ug | ORAL_TABLET | Freq: Every day | ORAL | 0 refills | Status: DC

## 2018-03-08 MED ORDER — FLUCONAZOLE 150 MG PO TABS: 150.0000 mg | ORAL_TABLET | Freq: Once | ORAL | 0 refills | Status: AC

## 2018-03-08 MED ORDER — LINACLOTIDE 72 MCG PO CAPS: 72.0000 ug | ORAL_CAPSULE | Freq: Every day | ORAL | 2 refills | Status: DC

## 2018-03-08 NOTE — Patient Instructions (Signed)
Stop Valsartan.  Take Diflucan for 1 dose for the vaginal itching.
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
## 2018-03-09 LAB — COMPREHENSIVE METABOLIC PANEL
ALT: 16 IU/L (ref 0–32)
AST: 20 IU/L (ref 0–40)
Albumin/Globulin Ratio: 1.2 (ref 1.2–2.2)
Albumin: 4.5 g/dL (ref 3.6–4.8)
Alkaline Phosphatase: 81 IU/L (ref 39–117)
BUN/Creatinine Ratio: 10 — ABNORMAL LOW (ref 12–28)
BUN: 13 mg/dL (ref 8–27)
Bilirubin Total: 0.5 mg/dL (ref 0.0–1.2)
CO2: 23 mmol/L (ref 20–29)
Calcium: 9.6 mg/dL (ref 8.7–10.3)
Chloride: 104 mmol/L (ref 96–106)
Creatinine, Ser: 1.25 mg/dL — ABNORMAL HIGH (ref 0.57–1.00)
GFR calc Af Amer: 53 mL/min/{1.73_m2} — ABNORMAL LOW (ref >59)
GFR calc non Af Amer: 46 mL/min/{1.73_m2} — ABNORMAL LOW (ref >59)
Globulin, Total: 3.7 g/dL (ref 1.5–4.5)
Glucose: 107 mg/dL — ABNORMAL HIGH (ref 65–99)
Potassium: 4.3 mmol/L (ref 3.5–5.2)
Sodium: 141 mmol/L (ref 134–144)
Total Protein: 8.2 g/dL (ref 6.0–8.5)

## 2018-03-09 LAB — TSH: TSH: 1.11 u[IU]/mL (ref 0.450–4.500)

## 2018-03-09 LAB — CBC
Hematocrit: 44.5 % (ref 34.0–46.6)
Hemoglobin: 15.8 g/dL (ref 11.1–15.9)
MCH: 30.9 pg (ref 26.6–33.0)
MCHC: 35.5 g/dL (ref 31.5–35.7)
MCV: 87 fL (ref 79–97)
Platelets: 280 10*3/uL (ref 150–450)
RBC: 5.11 x10E6/uL (ref 3.77–5.28)
RDW: 14.2 % (ref 12.3–15.4)
WBC: 12 10*3/uL — ABNORMAL HIGH (ref 3.4–10.8)

## 2018-03-09 LAB — LIPID PANEL
Chol/HDL Ratio: 4.2 ratio (ref 0.0–4.4)
Cholesterol, Total: 139 mg/dL (ref 100–199)
HDL: 33 mg/dL — ABNORMAL LOW (ref >39)
LDL Calculated: 77 mg/dL (ref 0–99)
Triglycerides: 146 mg/dL (ref 0–149)
VLDL Cholesterol Cal: 29 mg/dL (ref 5–40)

## 2018-03-12 ENCOUNTER — Ambulatory Visit (HOSPITAL_COMMUNITY)
Admission: EM | Admit: 2018-03-12 | Discharge: 2018-03-12 | Disposition: A | Discharge status: Home / Self Care | Payer: 59 | Attending: Family Medicine | Admitting: Family Medicine

## 2018-03-12 ENCOUNTER — Encounter (HOSPITAL_COMMUNITY): Payer: Self-pay | Admitting: Emergency Medicine

## 2018-03-12 DIAGNOSIS — M542 Cervicalgia: Secondary | ICD-10-CM | POA: Diagnosis not present

## 2018-03-12 DIAGNOSIS — R001 Bradycardia, unspecified: Secondary | ICD-10-CM

## 2018-03-12 MED ORDER — PREDNISONE 20 MG PO TABS: 40.0000 mg | ORAL_TABLET | Freq: Every day | ORAL | 0 refills | Status: AC

## 2018-03-12 NOTE — Discharge Instructions (Signed)
See exercises provided. Light range of motion as able.  Heat application, massage to help with pain.  Complete 5 days of prednisone to help with pain, please monitor your blood sugar as this medication can increase your blood sugar.  Please make appointment for follow up with your primary care provider for recheck of your heart rate and neck pain.  If you develop dizziness, chest pain, weakness, shortness of breath, nausea, sweating please go to the Er.

## 2018-03-12 NOTE — ED Triage Notes (Signed)
PT woke up with a stiff neck on Sunday. PT also had eye drainage Sunday morning.
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
## 2018-03-13 ENCOUNTER — Telehealth: Payer: Self-pay

## 2018-03-13 NOTE — Telephone Encounter (Signed)
Contacted pt to go over lab results pt is aware and doesn't have any questions or concerns 

## 2018-03-18 DIAGNOSIS — M1712 Unilateral primary osteoarthritis, left knee: Secondary | ICD-10-CM | POA: Diagnosis not present

## 2018-03-27 DIAGNOSIS — H401111 Primary open-angle glaucoma, right eye, mild stage: Secondary | ICD-10-CM | POA: Diagnosis not present

## 2018-03-27 DIAGNOSIS — H401122 Primary open-angle glaucoma, left eye, moderate stage: Secondary | ICD-10-CM | POA: Diagnosis not present

## 2018-03-27 DIAGNOSIS — H11153 Pinguecula, bilateral: Secondary | ICD-10-CM | POA: Diagnosis not present

## 2018-03-27 DIAGNOSIS — H04123 Dry eye syndrome of bilateral lacrimal glands: Secondary | ICD-10-CM | POA: Diagnosis not present

## 2018-03-27 DIAGNOSIS — Z961 Presence of intraocular lens: Secondary | ICD-10-CM | POA: Diagnosis not present

## 2018-03-27 DIAGNOSIS — H47233 Glaucomatous optic atrophy, bilateral: Secondary | ICD-10-CM | POA: Diagnosis not present

## 2018-03-27 DIAGNOSIS — H0100A Unspecified blepharitis right eye, upper and lower eyelids: Secondary | ICD-10-CM | POA: Diagnosis not present

## 2018-03-27 DIAGNOSIS — H18413 Arcus senilis, bilateral: Secondary | ICD-10-CM | POA: Diagnosis not present

## 2018-03-27 DIAGNOSIS — Z9849 Cataract extraction status, unspecified eye: Secondary | ICD-10-CM | POA: Diagnosis not present

## 2018-04-17 ENCOUNTER — Other Ambulatory Visit: Payer: Self-pay | Admitting: Internal Medicine

## 2018-04-17 DIAGNOSIS — E785 Hyperlipidemia, unspecified: Secondary | ICD-10-CM

## 2018-05-14 ENCOUNTER — Ambulatory Visit: Payer: Medicare HMO | Attending: Internal Medicine | Admitting: Internal Medicine

## 2018-05-14 ENCOUNTER — Encounter: Payer: Self-pay | Admitting: Internal Medicine

## 2018-05-14 ENCOUNTER — Other Ambulatory Visit: Payer: Self-pay

## 2018-05-14 VITALS — BP 110/58 | HR 43 | Temp 98.1°F | Resp 16 | Wt 246.2 lb

## 2018-05-14 DIAGNOSIS — Z23 Encounter for immunization: Secondary | ICD-10-CM

## 2018-05-14 DIAGNOSIS — Z91040 Latex allergy status: Secondary | ICD-10-CM | POA: Diagnosis not present

## 2018-05-14 DIAGNOSIS — R001 Bradycardia, unspecified: Secondary | ICD-10-CM

## 2018-05-14 DIAGNOSIS — E1142 Type 2 diabetes mellitus with diabetic polyneuropathy: Secondary | ICD-10-CM | POA: Insufficient documentation

## 2018-05-14 DIAGNOSIS — Z96659 Presence of unspecified artificial knee joint: Secondary | ICD-10-CM | POA: Diagnosis not present

## 2018-05-14 DIAGNOSIS — Z7982 Long term (current) use of aspirin: Secondary | ICD-10-CM | POA: Diagnosis not present

## 2018-05-14 DIAGNOSIS — E119 Type 2 diabetes mellitus without complications: Secondary | ICD-10-CM

## 2018-05-14 DIAGNOSIS — Z882 Allergy status to sulfonamides status: Secondary | ICD-10-CM | POA: Insufficient documentation

## 2018-05-14 DIAGNOSIS — Z880 Allergy status to penicillin: Secondary | ICD-10-CM | POA: Insufficient documentation

## 2018-05-14 DIAGNOSIS — E11649 Type 2 diabetes mellitus with hypoglycemia without coma: Secondary | ICD-10-CM

## 2018-05-14 DIAGNOSIS — Z91041 Radiographic dye allergy status: Secondary | ICD-10-CM | POA: Diagnosis not present

## 2018-05-14 DIAGNOSIS — Z79890 Hormone replacement therapy (postmenopausal): Secondary | ICD-10-CM | POA: Diagnosis not present

## 2018-05-14 DIAGNOSIS — N183 Chronic kidney disease, stage 3 unspecified: Secondary | ICD-10-CM

## 2018-05-14 DIAGNOSIS — Z9889 Other specified postprocedural states: Secondary | ICD-10-CM | POA: Diagnosis not present

## 2018-05-14 DIAGNOSIS — Z87891 Personal history of nicotine dependence: Secondary | ICD-10-CM | POA: Insufficient documentation

## 2018-05-14 DIAGNOSIS — M47812 Spondylosis without myelopathy or radiculopathy, cervical region: Secondary | ICD-10-CM | POA: Insufficient documentation

## 2018-05-14 DIAGNOSIS — I129 Hypertensive chronic kidney disease with stage 1 through stage 4 chronic kidney disease, or unspecified chronic kidney disease: Secondary | ICD-10-CM | POA: Insufficient documentation

## 2018-05-14 DIAGNOSIS — E785 Hyperlipidemia, unspecified: Secondary | ICD-10-CM | POA: Diagnosis not present

## 2018-05-14 DIAGNOSIS — Z79899 Other long term (current) drug therapy: Secondary | ICD-10-CM | POA: Diagnosis not present

## 2018-05-14 DIAGNOSIS — E1122 Type 2 diabetes mellitus with diabetic chronic kidney disease: Secondary | ICD-10-CM | POA: Diagnosis not present

## 2018-05-14 DIAGNOSIS — M47892 Other spondylosis, cervical region: Secondary | ICD-10-CM

## 2018-05-14 DIAGNOSIS — E039 Hypothyroidism, unspecified: Secondary | ICD-10-CM | POA: Diagnosis not present

## 2018-05-14 DIAGNOSIS — I10 Essential (primary) hypertension: Secondary | ICD-10-CM

## 2018-05-14 DIAGNOSIS — J4540 Moderate persistent asthma, uncomplicated: Secondary | ICD-10-CM | POA: Insufficient documentation

## 2018-05-14 LAB — GLUCOSE, POCT (MANUAL RESULT ENTRY)
POC Glucose: 69 mg/dl — AB (ref 70–99)
POC Glucose: 115 mg/dl — AB (ref 70–99)
POC Glucose: 63 mg/dl — AB (ref 70–99)
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
## 2018-05-14 NOTE — Patient Instructions (Signed)
Always keep a pack of crackers or glucose tablets (can be purchased at [facility]) with you to eat if blood sugars drop low.
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
## 2018-06-14 DIAGNOSIS — M1712 Unilateral primary osteoarthritis, left knee: Secondary | ICD-10-CM | POA: Diagnosis not present

## 2018-06-25 ENCOUNTER — Encounter: Payer: Self-pay | Admitting: Internal Medicine

## 2018-06-25 ENCOUNTER — Ambulatory Visit: Payer: Medicare HMO | Attending: Internal Medicine | Admitting: Internal Medicine

## 2018-06-25 VITALS — BP 139/80 | HR 55 | Temp 98.2°F | Resp 16 | Wt 254.6 lb

## 2018-06-25 DIAGNOSIS — E1142 Type 2 diabetes mellitus with diabetic polyneuropathy: Secondary | ICD-10-CM

## 2018-06-25 DIAGNOSIS — I10 Essential (primary) hypertension: Secondary | ICD-10-CM | POA: Diagnosis not present

## 2018-06-25 DIAGNOSIS — M1712 Unilateral primary osteoarthritis, left knee: Secondary | ICD-10-CM | POA: Insufficient documentation

## 2018-06-25 DIAGNOSIS — Z87891 Personal history of nicotine dependence: Secondary | ICD-10-CM | POA: Insufficient documentation

## 2018-06-25 DIAGNOSIS — Z7951 Long term (current) use of inhaled steroids: Secondary | ICD-10-CM | POA: Diagnosis not present

## 2018-06-25 DIAGNOSIS — Z79899 Other long term (current) drug therapy: Secondary | ICD-10-CM | POA: Insufficient documentation

## 2018-06-25 DIAGNOSIS — N183 Chronic kidney disease, stage 3 (moderate): Secondary | ICD-10-CM | POA: Insufficient documentation

## 2018-06-25 DIAGNOSIS — Z7982 Long term (current) use of aspirin: Secondary | ICD-10-CM | POA: Insufficient documentation

## 2018-06-25 DIAGNOSIS — E119 Type 2 diabetes mellitus without complications: Secondary | ICD-10-CM | POA: Diagnosis not present

## 2018-06-25 DIAGNOSIS — E890 Postprocedural hypothyroidism: Secondary | ICD-10-CM | POA: Insufficient documentation

## 2018-06-25 DIAGNOSIS — Z90710 Acquired absence of both cervix and uterus: Secondary | ICD-10-CM | POA: Insufficient documentation

## 2018-06-25 DIAGNOSIS — Z6841 Body Mass Index (BMI) 40.0 and over, adult: Secondary | ICD-10-CM | POA: Diagnosis not present

## 2018-06-25 DIAGNOSIS — Z880 Allergy status to penicillin: Secondary | ICD-10-CM | POA: Insufficient documentation

## 2018-06-25 DIAGNOSIS — E1122 Type 2 diabetes mellitus with diabetic chronic kidney disease: Secondary | ICD-10-CM | POA: Insufficient documentation

## 2018-06-25 DIAGNOSIS — E785 Hyperlipidemia, unspecified: Secondary | ICD-10-CM

## 2018-06-25 DIAGNOSIS — Z96651 Presence of right artificial knee joint: Secondary | ICD-10-CM | POA: Insufficient documentation

## 2018-06-25 DIAGNOSIS — I129 Hypertensive chronic kidney disease with stage 1 through stage 4 chronic kidney disease, or unspecified chronic kidney disease: Secondary | ICD-10-CM | POA: Diagnosis not present

## 2018-06-25 LAB — GLUCOSE, POCT (MANUAL RESULT ENTRY): POC Glucose: 172 mg/dl — AB (ref 70–99)

## 2018-06-25 LAB — POCT GLYCOSYLATED HEMOGLOBIN (HGB A1C): HbA1c, POC (controlled diabetic range): 7 % (ref 0.0–7.0)

## 2018-06-25 MED ORDER — LINACLOTIDE 72 MCG PO CAPS: 72.0000 ug | ORAL_CAPSULE | Freq: Every day | ORAL | 6 refills | Status: DC

## 2018-06-25 MED ORDER — GLIPIZIDE 10 MG PO TABS: 10.0000 mg | ORAL_TABLET | Freq: Two times a day (BID) | ORAL | 2 refills | Status: DC

## 2018-06-25 MED ORDER — PRAVASTATIN SODIUM 20 MG PO TABS: 20.0000 mg | ORAL_TABLET | Freq: Every day | ORAL | 2 refills | Status: DC

## 2018-06-25 MED ORDER — LEVOTHYROXINE SODIUM 88 MCG PO TABS: 88.0000 ug | ORAL_TABLET | Freq: Every day | ORAL | 2 refills | Status: DC

## 2018-06-25 MED ORDER — SITAGLIPTIN PHOSPHATE 100 MG PO TABS: 100.0000 mg | ORAL_TABLET | Freq: Every day | ORAL | 2 refills | Status: DC

## 2018-06-25 MED ORDER — HYDROCHLOROTHIAZIDE 12.5 MG PO CAPS: 12.5000 mg | ORAL_CAPSULE | Freq: Every day | ORAL | 2 refills | Status: DC

## 2018-06-25 NOTE — Progress Notes (Signed)
Patient ID: Kayla Bean, female    DOB: 1956-09-17  MRN: [id number]  CC: Diabetes and Hypertension   Subjective: Kayla Bean is a 62 y.o. female who presents for chronic disease management Her concerns today include:  Patient with history of diabetes, hypertension,HL,hypothyroidism, asthma, CKD stage III, and obesity.  Bradycardia: Referred to cardiology on last visit.  She has not received appointment as yet.  DM:  A1C has decreased 2 points since last check.  Doing better with eating habits. No fried meats.  Drinking mainly water.  Has not done much exercise in past mth Compliant with meds.  No low BS episodes since last visit.  She checks BS twice a wk.  Gives range 118-130. Needing refills on medications.  Obesity:  Considering wgh reduction surgery -had joined [employer] last yr for free but never went.  Plans to join again and started going  HTN:  No device to check BP but will be getting a free device through her insurance Compliant with meds and salt restriction Needs RF on HCZT Patient Active Problem List   Diagnosis Date Noted  . Diabetic polyneuropathy associated with type 2 diabetes mellitus ([place]) 07/30/2017  . Moderate persistent asthma without complication 20/35/5974  . Hyperlipidemia 03/22/2017  . CKD stage 3 due to type 2 diabetes mellitus ([place]) 03/19/2013  . Cholelithiasis 12/24/2012  . LIPOMA 07/26/2010  . SEBACEOUS CYST 11/19/2009  . CONSTIPATION, CHRONIC 01/18/2009  . EUSTACHIAN TUBE DYSFUNCTION, RIGHT 10/16/2008  . LOM 09/01/2008  . Essential hypertension 08/18/2008  . Osteoarthritis of left knee 04/23/2008  . PLANTAR FASCIITIS, RIGHT 04/23/2008  . Hypothyroidism 10/15/2007  . Diabetes type 2, uncontrolled ([place]) 09/04/2007     Current Outpatient Medications on File Prior to Visit  Medication Sig Dispense Refill  . Albuterol Sulfate (PROAIR RESPICLICK) 163 (90 Base) MCG/ACT AEPB Inhale 2 puffs into the lungs every 6 (six) hours as  needed (for breathing). 1 each 12  . aspirin 81 MG tablet Take 1 tablet (81 mg total) daily by mouth. 90 tablet 0  . Blood Glucose Monitoring Suppl (TRUE METRIX METER) w/Device KIT Use as directed 1 kit 0  . brimonidine-timolol (COMBIGAN) 0.2-0.5 % ophthalmic solution Place 1 drop into both eyes every 12 (twelve) hours.    . budesonide-formoterol (SYMBICORT) 80-4.5 MCG/ACT inhaler Inhale 2 puffs 2 (two) times daily into the lungs. (Patient not taking: Reported on 09/09/2017) 1 Inhaler 6  . cycloSPORINE (RESTASIS) 0.05 % ophthalmic emulsion Place 1 drop into both eyes 2 (two) times daily.    Marland Kitchen gabapentin (NEURONTIN) 100 MG capsule Take 2 capsules (200 mg total) at bedtime by mouth. (Patient not taking: Reported on 09/09/2017) 60 capsule 6  . glucose blood (TRUE METRIX BLOOD GLUCOSE TEST) test strip Use as instructed to check blood sugar 3 times daily. E11.9 100 each 12  . Lancet Devices (ACCU-CHEK SOFTCLIX) lancets Use as instructed for 3 times daily testing of blood glucose 1 each 0  . latanoprost (XALATAN) 0.005 % ophthalmic solution Place 1 drop into the left eye at bedtime.    Marland Kitchen lisinopril (PRINIVIL,ZESTRIL) 20 MG tablet Take 1 tablet (20 mg total) by mouth daily. 90 tablet 3  . TRUEPLUS LANCETS 28G MISC Use as directed 100 each 1   No current facility-administered medications on file prior to visit.     Allergies  Allergen Reactions  . Latex Hives  . Metformin And Related     Hair loss  . Penicillins Hives  . Warfarin Sodium Hives  REACTION: Got rash when took one pill of coumadin  related to knee replacement surgery in 2007.  . Iodinated Diagnostic Agents Other (See Comments)    PT STATES SHE HAD AN ALLERGIC REACTION OF BLISTERS ON HANDS AND FEET 2 DAYS AFTER CT SCAN W/ IV CONTRAST INJECTION, DR. MANSELL SUGGESTS 13 HR PREP//A.C.    Social History   Socioeconomic History  . Marital status: Single    Spouse name: Not on file  . Number of children: Not on file  . Years of  education: Not on file  . Highest education level: Not on file  Occupational History  . Not on file  Social Needs  . Financial resource strain: Not on file  . Food insecurity:    Worry: Not on file    Inability: Not on file  . Transportation needs:    Medical: Not on file    Non-medical: Not on file  Tobacco Use  . Smoking status: Former Smoker    Last attempt to quit: 09/18/2001    Years since quitting: 16.7  . Smokeless tobacco: Never Used  Substance and Sexual Activity  . Alcohol use: Yes  . Drug use: No  . Sexual activity: Not on file  Lifestyle  . Physical activity:    Days per week: Not on file    Minutes per session: Not on file  . Stress: Not on file  Relationships  . Social connections:    Talks on phone: Not on file    Gets together: Not on file    Attends religious service: Not on file    Active member of club or organization: Not on file    Attends meetings of clubs or organizations: Not on file    Relationship status: Not on file  . Intimate partner violence:    Fear of current or ex partner: Not on file    Emotionally abused: Not on file    Physically abused: Not on file    Forced sexual activity: Not on file  Other Topics Concern  . Not on file  Social History Narrative  . Not on file    No family history on file.  Past Surgical History:  Procedure Laterality Date  . ABDOMINAL HYSTERECTOMY    . KNEE SURGERY     left  . THYROIDECTOMY    . TOTAL KNEE ARTHROPLASTY     right knee    ROS: Review of Systems Negative except as above  PHYSICAL EXAM: BP 139/80   Pulse (!) 55   Temp 98.2 F (36.8 C) (Oral)   Resp 16   Wt 254 lb 9.6 oz (115.5 kg)   SpO2 99%   BMI 46.57 kg/m   Repeat blood pressure 134/74 Physical Exam General appearance - alert, well appearing, and in no distress Mental status - normal mood, behavior, speech, dress, motor activity, and thought processes Neck - supple, no significant adenopathy Chest - clear to  auscultation, no wheezes, rales or rhonchi, symmetric air entry Heart - normal rate, regular rhythm, normal S1, S2, no murmurs, rubs, clicks or gallops Extremities - peripheral pulses normal, no pedal edema, no clubbing or cyanosis Diabetic Foot Exam - Simple   Simple Foot Form Visual Inspection See comments:  Yes Sensation Testing Intact to touch and monofilament testing bilaterally:  Yes Pulse Check Posterior Tibialis and Dorsalis pulse intact bilaterally:  Yes Comments Flat footed     Results for orders placed or performed in visit on 06/25/18  POCT glucose (manual entry)  Result Value Ref Range   POC Glucose 172 (A) 70 - 99 mg/dl  POCT glycosylated hemoglobin (Hb A1C)  Result Value Ref Range   Hemoglobin A1C     HbA1c POC (<> result, manual entry)     HbA1c, POC (prediabetic range)     HbA1c, POC (controlled diabetic range) 7.0 0.0 - 7.0 %   ASSESSMENT AND PLAN: 1. Controlled type 2 diabetes mellitus without complication, without long-term current use of insulin ([place]) Commended her on improvement in her A1c and blood sugar levels.  Continue Januvia and Glucotrol.  Encourage some form of physical activity at least 3 times a week.  She will start low on go slow trying to get in 15 minutes 3 times a week.  Healthy eating habits discussed. - POCT glucose (manual entry) - POCT glycosylated hemoglobin (Hb A1C) - sitaGLIPtin (JANUVIA) 100 MG tablet; Take 1 tablet (100 mg total) by mouth daily.  Dispense: 90 tablet; Refill: 2 - glipiZIDE (GLUCOTROL) 10 MG tablet; Take 1 tablet (10 mg total) by mouth 2 (two) times daily before a meal.  Dispense: 180 tablet; Refill: 2 - Amb ref to Medical Nutrition Therapy-MNT  2. Essential hypertension Blood pressure close to goal.  She will continue current medications of lisinopril and hydrochlorothiazide - hydrochlorothiazide (MICROZIDE) 12.5 MG capsule; Take 1 capsule (12.5 mg total) by mouth daily.  Dispense: 90 capsule; Refill: 2  3.  Postoperative hypothyroidism - levothyroxine (SYNTHROID, LEVOTHROID) 88 MCG tablet; Take 1 tablet (88 mcg total) by mouth daily.  Dispense: 90 tablet; Refill: 2  4. Hyperlipidemia, unspecified hyperlipidemia type - pravastatin (PRAVACHOL) 20 MG tablet; Take 1 tablet (20 mg total) by mouth daily.  Dispense: 90 tablet; Refill: 2  5. Class 3 severe obesity with serious comorbidity and body mass index (BMI) of 45.0 to 49.9 in adult, unspecified obesity type ([place]) See #1 above.  She is contemplating weight reduction surgery.  I advised that we get her in with a nutritionist for some counseling.  Regular exercise also advised as discussed in #1 above. - Amb ref to Medical Nutrition Therapy-MNT   Patient was given the opportunity to ask questions.  Patient verbalized understanding of the plan and was able to repeat key elements of the plan.   Orders Placed This Encounter  Procedures  . Amb ref to Medical Nutrition Therapy-MNT  . POCT glucose (manual entry)  . POCT glycosylated hemoglobin (Hb A1C)     Requested Prescriptions   Signed Prescriptions Disp Refills  . hydrochlorothiazide (MICROZIDE) 12.5 MG capsule 90 capsule 2    Sig: Take 1 capsule (12.5 mg total) by mouth daily.  Marland Kitchen levothyroxine (SYNTHROID, LEVOTHROID) 88 MCG tablet 90 tablet 2    Sig: Take 1 tablet (88 mcg total) by mouth daily.  . pravastatin (PRAVACHOL) 20 MG tablet 90 tablet 2    Sig: Take 1 tablet (20 mg total) by mouth daily.  Marland Kitchen linaclotide (LINZESS) 72 MCG capsule 30 capsule 6    Sig: Take 1 capsule (72 mcg total) by mouth daily before breakfast.  . sitaGLIPtin (JANUVIA) 100 MG tablet 90 tablet 2    Sig: Take 1 tablet (100 mg total) by mouth daily.  Marland Kitchen glipiZIDE (GLUCOTROL) 10 MG tablet 180 tablet 2    Sig: Take 1 tablet (10 mg total) by mouth 2 (two) times daily before a meal.    Return in about 3 months (around 09/25/2018).  Karle Plumber, MD, FACP

## 2018-06-26 DIAGNOSIS — H18413 Arcus senilis, bilateral: Secondary | ICD-10-CM | POA: Diagnosis not present

## 2018-06-26 DIAGNOSIS — H401122 Primary open-angle glaucoma, left eye, moderate stage: Secondary | ICD-10-CM | POA: Diagnosis not present

## 2018-06-26 DIAGNOSIS — Z7984 Long term (current) use of oral hypoglycemic drugs: Secondary | ICD-10-CM | POA: Diagnosis not present

## 2018-06-26 DIAGNOSIS — H02054 Trichiasis without entropian left upper eyelid: Secondary | ICD-10-CM | POA: Diagnosis not present

## 2018-06-26 DIAGNOSIS — H401111 Primary open-angle glaucoma, right eye, mild stage: Secondary | ICD-10-CM | POA: Diagnosis not present

## 2018-06-26 DIAGNOSIS — H47233 Glaucomatous optic atrophy, bilateral: Secondary | ICD-10-CM | POA: Diagnosis not present

## 2018-06-26 DIAGNOSIS — Z961 Presence of intraocular lens: Secondary | ICD-10-CM | POA: Diagnosis not present

## 2018-06-26 DIAGNOSIS — H11153 Pinguecula, bilateral: Secondary | ICD-10-CM | POA: Diagnosis not present

## 2018-06-26 DIAGNOSIS — E119 Type 2 diabetes mellitus without complications: Secondary | ICD-10-CM | POA: Diagnosis not present

## 2018-06-27 DIAGNOSIS — Z0100 Encounter for examination of eyes and vision without abnormal findings: Secondary | ICD-10-CM | POA: Diagnosis not present

## 2018-07-03 ENCOUNTER — Encounter: Payer: Medicare HMO | Attending: Internal Medicine | Admitting: Registered"

## 2018-07-03 ENCOUNTER — Encounter: Payer: Self-pay | Admitting: Registered"

## 2018-07-03 DIAGNOSIS — Z6841 Body Mass Index (BMI) 40.0 and over, adult: Secondary | ICD-10-CM | POA: Insufficient documentation

## 2018-07-03 DIAGNOSIS — E119 Type 2 diabetes mellitus without complications: Secondary | ICD-10-CM

## 2018-07-03 DIAGNOSIS — Z713 Dietary counseling and surveillance: Secondary | ICD-10-CM | POA: Diagnosis not present

## 2018-07-03 NOTE — Patient Instructions (Addendum)
-   Look into attending local [employer] for water exercises.   - Aim to eat balanced meals including protein, carbohydrates, and non-starchy vegetables. See book, page 15.   - Attend Type 2 diabetes support group. See flyer.

## 2018-07-03 NOTE — Progress Notes (Signed)
Diabetes Self-Management Education  Visit Type:  First/Initial  Appt. Start Time: 3:30  Appt. End Time: 4:30  07/08/2018  [name], identified by name and date of birth, is a 62 y.o. female with a diagnosis of Diabetes: Type 2.   ASSESSMENT  Pt expectations: knowledge on the types of food to get rid of diabetes and to lower weight  Pt arrives crying stating she hates having diabetes. Pt states she loses weight temporarily and then it comes back. Pt states recent A1c decreased to 7.0. Pt states she has been using air fryer for chicken wings. Pt states she receives $82/month in EBT benefits. Pt states she does not have enough money to buy organic foods. Pt states she knows carbohydrates are not good for you. Pt states she no longer uses cane sugar; uses splenda as replacement.   Pt states she is focused on weight loss because she has had 2 total knee replacements in right knee and needs left knee replacement.   Pt states she has silver sneakers membership at [employer]  Next time: alcohol, physical activity, etc.   There were no vitals taken for this visit. There is no height or weight on file to calculate BMI.   Diabetes Self-Management Education - 07/03/18 1538      Health Coping   How would you rate your overall health?  Good      Psychosocial Assessment   Patient Belief/Attitude about Diabetes  Other (comment)   Pt states she hates it.    Self-care barriers  None    Self-management support  None;Doctor's office    Patient Concerns  Nutrition/Meal planning    Special Needs  None    Preferred Learning Style  No preference indicated    Learning Readiness  Ready      Complications   Last HgB A1C per patient/outside source  7 %    How often do you check your blood sugar?  3-4 times / week    Fasting Blood glucose range (mg/dL)  --   not checking fasting   Postprandial Blood glucose range (mg/dL)  70-129    Number of hypoglycemic episodes per month  0    Number of hyperglycemic episodes per week  0    Have you had a dilated eye exam in the past 12 months?  Yes    Have you had a dental exam in the past 12 months?  Yes    Are you checking your feet?  No      Dietary Intake   Breakfast  boiled eggs + crackers or meal replacement shake or beans    Snack (morning)  none    Lunch  salad + eggs + cheese + onions    Snack (afternoon)  apples, grapes, bananas, or watermelon    Dinner  chicken wings + beans or fish sticks or salad    Snack (evening)  none    Beverage(s)  water with flavor, coffee, meal replacement shakes, wine (once/month)      Exercise   Exercise Type  Light (walking / raking leaves)    How many days per week to you exercise?  5    How many minutes per day do you exercise?  15    Total minutes per week of exercise  75      Patient Education   Previous Diabetes Education  No    Disease state   Definition of diabetes, type 1 and 2, and the diagnosis of diabetes  Nutrition management   Role of diet in the treatment of diabetes and the relationship between the three main macronutrients and blood glucose level;Reviewed blood glucose goals for pre and post meals and how to evaluate the patients' food intake on their blood glucose level.    Monitoring  Purpose and frequency of SMBG.;Taught/discussed recording of test results and interpretation of SMBG.;Interpreting lab values - A1C, lipid, urine microalbumina.;Identified appropriate SMBG and/or A1C goals.    Acute complications  Taught treatment of hypoglycemia - the 15 rule.;Discussed and identified patients' treatment of hyperglycemia.;Covered sick day management with medication and food.    Psychosocial adjustment  Role of stress on diabetes;Brainstormed with patient on coping mechanisms for social situations, getting support from significant others, dealing with feelings about diabetes      Individualized Goals (developed by patient)   Nutrition  Follow meal plan discussed;General  guidelines for healthy choices and portions discussed    Physical Activity  15 minutes per day;Exercise 3-5 times per week    Medications  take my medication as prescribed    Monitoring   test my blood glucose as discussed;test blood glucose pre and post meals as discussed    Reducing Risk  treat hypoglycemia with 15 grams of carbs if blood glucose less than 70mg /dL;increase portions of healthy fats;increase portions of olive oil in diet      Post-Education Assessment   Patient understands the diabetes disease and treatment process.  Demonstrates understanding / competency    Patient understands incorporating nutritional management into lifestyle.  Demonstrates understanding / competency    Patient undertands incorporating physical activity into lifestyle.  Needs Review    Patient understands using medications safely.  Needs Review    Patient understands monitoring blood glucose, interpreting and using results  Demonstrates understanding / competency    Patient understands prevention, detection, and treatment of acute complications.  Demonstrates understanding / competency    Patient understands prevention, detection, and treatment of chronic complications.  Needs Review    Patient understands how to develop strategies to address psychosocial issues.  Needs Review    Patient understands how to develop strategies to promote health/change behavior.  Needs Review      Outcomes   Program Status  Not Completed       Learning Objective:  Patient will have a greater understanding of diabetes self-management. Patient education plan is to attend individual and/or group sessions per assessed needs and concerns.   Plan:   Patient Instructions  - Look into attending local [employer] for water exercises.   - Aim to eat balanced meals including protein, carbohydrates, and non-starchy vegetables. See book, page 15.   - Attend Type 2 diabetes support group. See flyer.     Expected Outcomes:   Demonstrated interest in learning. Expect positive outcomes  Education material provided: ADA Diabetes: Your Take Control Guide and Support group flyer  If problems or questions, patient to contact team via:  Phone and Email  Future DSME appointment: - 4-6 wks

## 2018-07-12 DIAGNOSIS — M1712 Unilateral primary osteoarthritis, left knee: Secondary | ICD-10-CM | POA: Diagnosis not present

## 2018-07-22 DIAGNOSIS — M1712 Unilateral primary osteoarthritis, left knee: Secondary | ICD-10-CM | POA: Diagnosis not present

## 2018-07-31 DIAGNOSIS — M1712 Unilateral primary osteoarthritis, left knee: Secondary | ICD-10-CM | POA: Diagnosis not present

## 2018-08-05 ENCOUNTER — Ambulatory Visit: Payer: Medicare HMO | Admitting: Registered"

## 2018-08-23 ENCOUNTER — Ambulatory Visit (HOSPITAL_COMMUNITY)
Admission: EM | Admit: 2018-08-23 | Discharge: 2018-08-23 | Disposition: A | Discharge status: Home / Self Care | Payer: Medicare HMO | Attending: Family Medicine | Admitting: Family Medicine

## 2018-08-23 ENCOUNTER — Encounter (HOSPITAL_COMMUNITY): Payer: Self-pay

## 2018-08-23 DIAGNOSIS — M1990 Unspecified osteoarthritis, unspecified site: Secondary | ICD-10-CM | POA: Insufficient documentation

## 2018-08-23 DIAGNOSIS — E039 Hypothyroidism, unspecified: Secondary | ICD-10-CM | POA: Insufficient documentation

## 2018-08-23 DIAGNOSIS — Z79899 Other long term (current) drug therapy: Secondary | ICD-10-CM | POA: Diagnosis not present

## 2018-08-23 DIAGNOSIS — J029 Acute pharyngitis, unspecified: Secondary | ICD-10-CM | POA: Diagnosis not present

## 2018-08-23 DIAGNOSIS — E119 Type 2 diabetes mellitus without complications: Secondary | ICD-10-CM | POA: Diagnosis not present

## 2018-08-23 DIAGNOSIS — Z87891 Personal history of nicotine dependence: Secondary | ICD-10-CM | POA: Diagnosis not present

## 2018-08-23 DIAGNOSIS — Z888 Allergy status to other drugs, medicaments and biological substances status: Secondary | ICD-10-CM | POA: Diagnosis not present

## 2018-08-23 DIAGNOSIS — Z8249 Family history of ischemic heart disease and other diseases of the circulatory system: Secondary | ICD-10-CM | POA: Insufficient documentation

## 2018-08-23 DIAGNOSIS — Z880 Allergy status to penicillin: Secondary | ICD-10-CM | POA: Insufficient documentation

## 2018-08-23 DIAGNOSIS — Z7984 Long term (current) use of oral hypoglycemic drugs: Secondary | ICD-10-CM | POA: Diagnosis not present

## 2018-08-23 DIAGNOSIS — J45909 Unspecified asthma, uncomplicated: Secondary | ICD-10-CM | POA: Insufficient documentation

## 2018-08-23 DIAGNOSIS — R030 Elevated blood-pressure reading, without diagnosis of hypertension: Secondary | ICD-10-CM

## 2018-08-23 DIAGNOSIS — Z7982 Long term (current) use of aspirin: Secondary | ICD-10-CM | POA: Diagnosis not present

## 2018-08-23 DIAGNOSIS — Z79890 Hormone replacement therapy (postmenopausal): Secondary | ICD-10-CM | POA: Diagnosis not present

## 2018-08-23 DIAGNOSIS — I10 Essential (primary) hypertension: Secondary | ICD-10-CM | POA: Diagnosis not present

## 2018-08-23 LAB — POCT RAPID STREP A: Streptococcus, Group A Screen (Direct): NEGATIVE

## 2018-08-23 MED ORDER — FLUTICASONE PROPIONATE 50 MCG/ACT NA SUSP: 2.0000 | Freq: Every day | NASAL | 0 refills | Status: DC

## 2018-08-23 MED ORDER — CETIRIZINE HCL 10 MG PO CHEW: 10.0000 mg | CHEWABLE_TABLET | Freq: Every day | ORAL | 0 refills | Status: DC

## 2018-08-23 NOTE — ED Triage Notes (Signed)
Pt presents with sore throat.

## 2018-08-23 NOTE — ED Provider Notes (Signed)
[place]   [id number] 08/23/18 Arrival Time: 6948  NI:OEVO THROAT  SUBJECTIVE: History from: patient.  Kayla Bean is a 62 y.o. female who presents with abrupt onset of sore throat x 1 day.  Denies positive sick exposure or precipitating event.  Has tried "pain pill" with relief.  Symptoms are made worse with swallowing, but tolerating liquids and own secretions without difficulty.  Complains of mild rhinorrhea at night and left ear pain.  Denies fever, chills, fatigue, sinus pain, nasal congestion, cough, SOB, wheezing, chest pain, nausea, rash, changes in bowel or bladder habits.    ROS: As per HPI.  Past Medical History:  Diagnosis Date  . Arthritis   . Asthma   . Diabetes mellitus   . Heart murmur   . Hemorrhoids   . Hypertension   . Thyroid disease    hypothyroidism   Past Surgical History:  Procedure Laterality Date  . ABDOMINAL HYSTERECTOMY    . KNEE SURGERY     left  . THYROIDECTOMY    . TOTAL KNEE ARTHROPLASTY     right knee   Allergies  Allergen Reactions  . Latex Hives  . Metformin And Related     Hair loss  . Penicillins Hives  . Warfarin Sodium Hives    REACTION: Got rash when took one pill of coumadin  related to knee replacement surgery in 2007.  . Iodinated Diagnostic Agents Other (See Comments)    PT STATES SHE HAD AN ALLERGIC REACTION OF BLISTERS ON HANDS AND FEET 2 DAYS AFTER CT SCAN W/ IV CONTRAST INJECTION, DR. MANSELL SUGGESTS 13 HR PREP//A.C.   No current facility-administered medications on file prior to encounter.    Current Outpatient Medications on File Prior to Encounter  Medication Sig Dispense Refill  . Albuterol Sulfate (PROAIR RESPICLICK) 350 (90 Base) MCG/ACT AEPB Inhale 2 puffs into the lungs every 6 (six) hours as needed (for breathing). 1 each 12  . aspirin 81 MG tablet Take 1 tablet (81 mg total) daily by mouth. 90 tablet 0  . Blood Glucose Monitoring Suppl (TRUE METRIX METER) w/Device KIT Use as directed 1  kit 0  . brimonidine-timolol (COMBIGAN) 0.2-0.5 % ophthalmic solution Place 1 drop into both eyes every 12 (twelve) hours.    . cycloSPORINE (RESTASIS) 0.05 % ophthalmic emulsion Place 1 drop into both eyes 2 (two) times daily.    Marland Kitchen glipiZIDE (GLUCOTROL) 10 MG tablet Take 1 tablet (10 mg total) by mouth 2 (two) times daily before a meal. 180 tablet 2  . glucose blood (TRUE METRIX BLOOD GLUCOSE TEST) test strip Use as instructed to check blood sugar 3 times daily. E11.9 100 each 12  . hydrochlorothiazide (MICROZIDE) 12.5 MG capsule Take 1 capsule (12.5 mg total) by mouth daily. 90 capsule 2  . Lancet Devices (ACCU-CHEK SOFTCLIX) lancets Use as instructed for 3 times daily testing of blood glucose 1 each 0  . latanoprost (XALATAN) 0.005 % ophthalmic solution Place 1 drop into the left eye at bedtime.    Marland Kitchen levothyroxine (SYNTHROID, LEVOTHROID) 88 MCG tablet Take 1 tablet (88 mcg total) by mouth daily. 90 tablet 2  . linaclotide (LINZESS) 72 MCG capsule Take 1 capsule (72 mcg total) by mouth daily before breakfast. 30 capsule 6  . lisinopril (PRINIVIL,ZESTRIL) 20 MG tablet Take 1 tablet (20 mg total) by mouth daily. 90 tablet 3  . pravastatin (PRAVACHOL) 20 MG tablet Take 1 tablet (20 mg total) by mouth daily. 90 tablet 2  . sitaGLIPtin (  JANUVIA) 100 MG tablet Take 1 tablet (100 mg total) by mouth daily. 90 tablet 2  . TRUEPLUS LANCETS 28G MISC Use as directed 100 each 1   Social History   Socioeconomic History  . Marital status: Single    Spouse name: Not on file  . Number of children: Not on file  . Years of education: Not on file  . Highest education level: Not on file  Occupational History  . Not on file  Social Needs  . Financial resource strain: Not on file  . Food insecurity:    Worry: Not on file    Inability: Not on file  . Transportation needs:    Medical: Not on file    Non-medical: Not on file  Tobacco Use  . Smoking status: Former Smoker    Last attempt to quit: 09/18/2001      Years since quitting: 16.9  . Smokeless tobacco: Never Used  Substance and Sexual Activity  . Alcohol use: Yes  . Drug use: No  . Sexual activity: Not on file  Lifestyle  . Physical activity:    Days per week: Not on file    Minutes per session: Not on file  . Stress: Not on file  Relationships  . Social connections:    Talks on phone: Not on file    Gets together: Not on file    Attends religious service: Not on file    Active member of club or organization: Not on file    Attends meetings of clubs or organizations: Not on file    Relationship status: Not on file  . Intimate partner violence:    Fear of current or ex partner: Not on file    Emotionally abused: Not on file    Physically abused: Not on file    Forced sexual activity: Not on file  Other Topics Concern  . Not on file  Social History Narrative  . Not on file   Family History  Problem Relation Age of Onset  . Asthma Other   . Cancer Other   . Hypertension Other   . Diabetes Other     OBJECTIVE:  Vitals:   08/23/18 1440  BP: (!) 151/93  Pulse: 70  Resp: 18  Temp: 98.2 F (36.8 C)  TempSrc: Oral  SpO2: 100%     General appearance: alert; appears mildly fatigued, but nontoxic, speaking in full sentences and managing own secretions HEENT: NCAT; Ears: EACs clear, TMs pearly gray with visible cone of light, without erythema; Eyes: PERRL, EOMI grossly; Nose: no obvious rhinorrhea; Throat: oropharynx clear, tonsils not enlarged or erythematous without white tonsillar exudates, uvula midline Neck: supple without LAD Lungs: CTA bilaterally without adventitious breath sounds; cough absent Heart: regular rate and rhythm.  Radial pulses 2+ symmetrical bilaterally Skin: warm and dry Psychological: alert and cooperative; normal mood and affect  LABS: Results for orders placed or performed during the hospital encounter of 08/23/18 (from the past 24 hour(s))  POCT rapid strep A [facility])     Status:  None   Collection Time: 08/23/18  3:30 PM  Result Value Ref Range   Streptococcus, Group A Screen (Direct) NEGATIVE NEGATIVE    ASSESSMENT & PLAN:  1. Viral pharyngitis   2. Elevated blood pressure reading   3. Sore throat     Meds ordered this encounter  Medications  . cetirizine (ZYRTEC) 10 MG chewable tablet    Sig: Chew 1 tablet (10 mg total) by mouth daily.  Dispense:  20 tablet    Refill:  0    Order Specific Question:   Supervising Provider    Answer:   Raylene Everts [[id number]]  . fluticasone (FLONASE) 50 MCG/ACT nasal spray    Sig: Place 2 sprays into both nostrils daily.    Dispense:  16 g    Refill:  0    Order Specific Question:   Supervising Provider    Answer:   Raylene Everts [[id number]]   Strep test negative, will send out for culture and we will call you with results Get plenty of rest and push fluids Zyrtec and flonase prescribed. Use daily for symptomatic relief Take OTC ibuprofen or tylenol as needed for pain Follow up with PCP if symptoms persists Return or go to ER if patient has any new or worsening symptoms such as fever, chills, nausea, vomiting, worsening sore throat, cough, abdominal pain, chest pain, changes in bowel or bladder habits, etc...  Blood pressure elevated in office.  Please recheck in 24 hours.  If it continues to be greater than 140/90 please follow up with PCP for further evaluation and management.   Reviewed expectations re: course of current medical issues. Questions answered. Outlined signs and symptoms indicating need for more acute intervention. Patient verbalized understanding. After Visit Summary given.        Lestine Box, PA-C 08/23/18 1535

## 2018-08-23 NOTE — Discharge Instructions (Addendum)
Strep test negative, will send out for culture and we will call you with results Get plenty of rest and push fluids Zyrtec and flonase prescribed. Use daily for symptomatic relief Take OTC ibuprofen or tylenol as needed for pain Follow up with PCP if symptoms persists Return or go to ER if patient has any new or worsening symptoms such as fever, chills, nausea, vomiting, worsening sore throat, cough, abdominal pain, chest pain, changes in bowel or bladder habits, etc...  Blood pressure elevated in office.  Please recheck in 24 hours.  If it continues to be greater than 140/90 please follow up with PCP for further evaluation and management.

## 2018-08-26 LAB — CULTURE, GROUP A STREP (THRC)

## 2018-08-28 DIAGNOSIS — M1712 Unilateral primary osteoarthritis, left knee: Secondary | ICD-10-CM | POA: Diagnosis not present

## 2018-09-13 ENCOUNTER — Ambulatory Visit: Payer: Medicare HMO | Admitting: Internal Medicine

## 2018-10-01 ENCOUNTER — Ambulatory Visit: Payer: Medicare HMO | Attending: Internal Medicine | Admitting: Internal Medicine

## 2018-10-01 ENCOUNTER — Encounter: Payer: Self-pay | Admitting: Internal Medicine

## 2018-10-01 VITALS — BP 148/93 | HR 58 | Temp 97.8°F | Resp 16 | Ht 62.0 in | Wt 256.0 lb

## 2018-10-01 DIAGNOSIS — IMO0001 Reserved for inherently not codable concepts without codable children: Secondary | ICD-10-CM

## 2018-10-01 DIAGNOSIS — I10 Essential (primary) hypertension: Secondary | ICD-10-CM | POA: Diagnosis not present

## 2018-10-01 DIAGNOSIS — Z6841 Body Mass Index (BMI) 40.0 and over, adult: Secondary | ICD-10-CM | POA: Diagnosis not present

## 2018-10-01 DIAGNOSIS — E1165 Type 2 diabetes mellitus with hyperglycemia: Secondary | ICD-10-CM

## 2018-10-01 LAB — POCT GLYCOSYLATED HEMOGLOBIN (HGB A1C): HbA1c, POC (controlled diabetic range): 7.9 % — AB (ref 0.0–7.0)

## 2018-10-01 LAB — GLUCOSE, POCT (MANUAL RESULT ENTRY): POC Glucose: 162 mg/dl — AB (ref 70–99)

## 2018-10-01 MED ORDER — AMLODIPINE BESYLATE 5 MG PO TABS: 5.0000 mg | ORAL_TABLET | Freq: Every day | ORAL | 3 refills | Status: DC

## 2018-10-01 NOTE — Patient Instructions (Signed)
Your blood pressure is not at goal.  Try to check your blood pressure at least 2-3 times a week with goal being 130/80 or lower.  We have added a low-dose of a blood pressure medicine called amlodipine 5 mg daily.  Continue the lisinopril and the hydrochlorothiazide.  You should move forward with your plans to join the [facility] and try to go several times a week for exercise.

## 2018-10-01 NOTE — Progress Notes (Signed)
Patient ID: Kayla Bean, female    DOB: December 21, 1955  MRN: [id number]  CC: Hypertension and Diabetes   Subjective: Kayla Bean is a 63 y.o. female who presents for chronic ds management Her concerns today include:  Patient with history of diabetes, hypertension,HL,hypothyroidism, asthma, CKD stage III, and obesity.  DM/Obesity:  Saw nutritionist once since last visit.  Drinks mainly water.  Avoids fried meats.  Loves fruits.  Ate out a lot during the holidays while she was visiting her daughter in [place].  She has not made it to gym as yet.  Spent time in [place] over the holidays with her daughter.  Just got back about 1 week ago.  She has [employer] research officer, government and plans to join [employer] -admits that she has not check BS since christmas  HTN:  Compliant with medications and salt restrictions. Took meds already for today Denies any chest pains or shortness of breath.  No lower extremity edema.  Patient Active Problem List   Diagnosis Date Noted  . Diabetic polyneuropathy associated with type 2 diabetes mellitus ([place]) 07/30/2017  . Moderate persistent asthma without complication 81/09/7508  . Hyperlipidemia 03/22/2017  . CKD stage 3 due to type 2 diabetes mellitus ([place]) 03/19/2013  . Cholelithiasis 12/24/2012  . LIPOMA 07/26/2010  . SEBACEOUS CYST 11/19/2009  . CONSTIPATION, CHRONIC 01/18/2009  . EUSTACHIAN TUBE DYSFUNCTION, RIGHT 10/16/2008  . LOM 09/01/2008  . Essential hypertension 08/18/2008  . Osteoarthritis of left knee 04/23/2008  . PLANTAR FASCIITIS, RIGHT 04/23/2008  . Hypothyroidism 10/15/2007  . Diabetes type 2, uncontrolled ([place]) 09/04/2007     Current Outpatient Medications on File Prior to Visit  Medication Sig Dispense Refill  . Albuterol Sulfate (PROAIR RESPICLICK) 258 (90 Base) MCG/ACT AEPB Inhale 2 puffs into the lungs every 6 (six) hours as needed (for breathing). 1 each 12  . aspirin 81 MG tablet Take 1 tablet (81 mg total) daily by mouth. 90  tablet 0  . Blood Glucose Monitoring Suppl (TRUE METRIX METER) w/Device KIT Use as directed 1 kit 0  . brimonidine-timolol (COMBIGAN) 0.2-0.5 % ophthalmic solution Place 1 drop into both eyes every 12 (twelve) hours.    . cetirizine (ZYRTEC) 10 MG chewable tablet Chew 1 tablet (10 mg total) by mouth daily. 20 tablet 0  . cycloSPORINE (RESTASIS) 0.05 % ophthalmic emulsion Place 1 drop into both eyes 2 (two) times daily.    . fluticasone (FLONASE) 50 MCG/ACT nasal spray Place 2 sprays into both nostrils daily. 16 g 0  . glipiZIDE (GLUCOTROL) 10 MG tablet Take 1 tablet (10 mg total) by mouth 2 (two) times daily before a meal. 180 tablet 2  . glucose blood (TRUE METRIX BLOOD GLUCOSE TEST) test strip Use as instructed to check blood sugar 3 times daily. E11.9 100 each 12  . hydrochlorothiazide (MICROZIDE) 12.5 MG capsule Take 1 capsule (12.5 mg total) by mouth daily. 90 capsule 2  . Lancet Devices (ACCU-CHEK SOFTCLIX) lancets Use as instructed for 3 times daily testing of blood glucose 1 each 0  . latanoprost (XALATAN) 0.005 % ophthalmic solution Place 1 drop into the left eye at bedtime.    Marland Kitchen levothyroxine (SYNTHROID, LEVOTHROID) 88 MCG tablet Take 1 tablet (88 mcg total) by mouth daily. 90 tablet 2  . linaclotide (LINZESS) 72 MCG capsule Take 1 capsule (72 mcg total) by mouth daily before breakfast. 30 capsule 6  . lisinopril (PRINIVIL,ZESTRIL) 20 MG tablet Take 1 tablet (20 mg total) by mouth daily. 90 tablet 3  .  pravastatin (PRAVACHOL) 20 MG tablet Take 1 tablet (20 mg total) by mouth daily. 90 tablet 2  . sitaGLIPtin (JANUVIA) 100 MG tablet Take 1 tablet (100 mg total) by mouth daily. 90 tablet 2  . TRUEPLUS LANCETS 28G MISC Use as directed 100 each 1   No current facility-administered medications on file prior to visit.     Allergies  Allergen Reactions  . Latex Hives  . Metformin And Related     Hair loss  . Penicillins Hives  . Warfarin Sodium Hives    REACTION: Got rash when took one  pill of coumadin  related to knee replacement surgery in 2007.  . Iodinated Diagnostic Agents Other (See Comments)    PT STATES SHE HAD AN ALLERGIC REACTION OF BLISTERS ON HANDS AND FEET 2 DAYS AFTER CT SCAN W/ IV CONTRAST INJECTION, DR. MANSELL SUGGESTS 13 HR PREP//A.C.    Social History   Socioeconomic History  . Marital status: Single    Spouse name: Not on file  . Number of children: Not on file  . Years of education: Not on file  . Highest education level: Not on file  Occupational History  . Not on file  Social Needs  . Financial resource strain: Not on file  . Food insecurity:    Worry: Not on file    Inability: Not on file  . Transportation needs:    Medical: Not on file    Non-medical: Not on file  Tobacco Use  . Smoking status: Former Smoker    Last attempt to quit: 09/18/2001    Years since quitting: 17.0  . Smokeless tobacco: Never Used  Substance and Sexual Activity  . Alcohol use: Yes  . Drug use: No  . Sexual activity: Not on file  Lifestyle  . Physical activity:    Days per week: Not on file    Minutes per session: Not on file  . Stress: Not on file  Relationships  . Social connections:    Talks on phone: Not on file    Gets together: Not on file    Attends religious service: Not on file    Active member of club or organization: Not on file    Attends meetings of clubs or organizations: Not on file    Relationship status: Not on file  . Intimate partner violence:    Fear of current or ex partner: Not on file    Emotionally abused: Not on file    Physically abused: Not on file    Forced sexual activity: Not on file  Other Topics Concern  . Not on file  Social History Narrative  . Not on file    Family History  Problem Relation Age of Onset  . Asthma Other   . Cancer Other   . Hypertension Other   . Diabetes Other     Past Surgical History:  Procedure Laterality Date  . ABDOMINAL HYSTERECTOMY    . KNEE SURGERY     left  . THYROIDECTOMY     . TOTAL KNEE ARTHROPLASTY     right knee    ROS: Review of Systems Negative except as above. PHYSICAL EXAM: BP (!) 148/93   Pulse (!) 58   Temp 97.8 F (36.6 C) (Oral)   Resp 16   Ht '5\' 2"'$  (1.575 m)   Wt 256 lb (116.1 kg)   SpO2 99%   BMI 46.82 kg/m   Wt Readings from Last 3 Encounters:  10/01/18 256 lb (116.1  kg)  06/25/18 254 lb 9.6 oz (115.5 kg)  05/14/18 246 lb 3.2 oz (111.7 kg)  Physical Exam  General appearance - alert, well appearing, and in no distress Mental status - normal mood, behavior, speech, dress, motor activity, and thought processes Mouth - mucous membranes moist, pharynx normal without lesions Neck - supple, no significant adenopathy Chest - clear to auscultation, no wheezes, rales or rhonchi, symmetric air entry Heart - normal rate, regular rhythm, normal S1, S2, no murmurs, rubs, clicks or gallops Extremities - peripheral pulses normal, no pedal edema, no clubbing or cyanosis Diabetic Foot Exam - Simple   Simple Foot Form Visual Inspection No deformities, no ulcerations, no other skin breakdown bilaterally:  Yes Sensation Testing Intact to touch and monofilament testing bilaterally:  Yes Pulse Check Posterior Tibialis and Dorsalis pulse intact bilaterally:  Yes Comments      Results for orders placed or performed in visit on 10/01/18  POCT glucose (manual entry)  Result Value Ref Range   POC Glucose 162 (A) 70 - 99 mg/dl  POCT glycosylated hemoglobin (Hb A1C)  Result Value Ref Range   Hemoglobin A1C     HbA1c POC (<> result, manual entry)     HbA1c, POC (prediabetic range)     HbA1c, POC (controlled diabetic range) 7.9 (A) 0.0 - 7.0 %    ASSESSMENT AND PLAN: 1. Uncontrolled type 2 diabetes mellitus without complication, without long-term current use of insulin ([place]) Discussed the importance of healthy eating habits, regular aerobic exercise (at least 150 minutes a week as tolerated) and medication compliance to achieve or maintain  control of diabetes. Patient has set goal to join the [facility] and start going at least 2 days a week to get some exercise.  She plans to get back on a healthier eating plan now that the holidays are over - POCT glucose (manual entry) - POCT glycosylated hemoglobin (Hb A1C)  2. Class 3 severe obesity with serious comorbidity and body mass index (BMI) of 45.0 to 49.9 in adult, unspecified obesity type ([place]) See #1 above  3. Essential hypertension Not at goal.  Add low-dose amlodipine - amLODipine (NORVASC) 5 MG tablet; Take 1 tablet (5 mg total) by mouth daily.  Dispense: 90 tablet; Refill: 3   Patient was given the opportunity to ask questions.  Patient verbalized understanding of the plan and was able to repeat key elements of the plan.   Orders Placed This Encounter  Procedures  . POCT glucose (manual entry)  . POCT glycosylated hemoglobin (Hb A1C)     Requested Prescriptions    No prescriptions requested or ordered in this encounter    No follow-ups on file.  Karle Plumber, MD, FACP

## 2018-11-13 ENCOUNTER — Other Ambulatory Visit: Payer: Self-pay | Admitting: Internal Medicine

## 2018-11-13 DIAGNOSIS — Z1231 Encounter for screening mammogram for malignant neoplasm of breast: Secondary | ICD-10-CM

## 2018-11-27 DIAGNOSIS — H401122 Primary open-angle glaucoma, left eye, moderate stage: Secondary | ICD-10-CM | POA: Diagnosis not present

## 2018-11-27 DIAGNOSIS — H11153 Pinguecula, bilateral: Secondary | ICD-10-CM | POA: Diagnosis not present

## 2018-11-27 DIAGNOSIS — H11823 Conjunctivochalasis, bilateral: Secondary | ICD-10-CM | POA: Diagnosis not present

## 2018-11-27 DIAGNOSIS — H04123 Dry eye syndrome of bilateral lacrimal glands: Secondary | ICD-10-CM | POA: Diagnosis not present

## 2018-11-27 DIAGNOSIS — H47233 Glaucomatous optic atrophy, bilateral: Secondary | ICD-10-CM | POA: Diagnosis not present

## 2018-11-27 DIAGNOSIS — H18413 Arcus senilis, bilateral: Secondary | ICD-10-CM | POA: Diagnosis not present

## 2018-11-27 DIAGNOSIS — H401111 Primary open-angle glaucoma, right eye, mild stage: Secondary | ICD-10-CM | POA: Diagnosis not present

## 2018-11-27 DIAGNOSIS — E119 Type 2 diabetes mellitus without complications: Secondary | ICD-10-CM | POA: Diagnosis not present

## 2018-11-27 DIAGNOSIS — Z79899 Other long term (current) drug therapy: Secondary | ICD-10-CM | POA: Diagnosis not present

## 2018-12-12 ENCOUNTER — Ambulatory Visit: Payer: Medicare HMO

## 2018-12-30 DIAGNOSIS — M25562 Pain in left knee: Secondary | ICD-10-CM | POA: Diagnosis not present

## 2018-12-30 DIAGNOSIS — M1712 Unilateral primary osteoarthritis, left knee: Secondary | ICD-10-CM | POA: Diagnosis not present

## 2018-12-31 DIAGNOSIS — L600 Ingrowing nail: Secondary | ICD-10-CM | POA: Diagnosis not present

## 2018-12-31 DIAGNOSIS — M79674 Pain in right toe(s): Secondary | ICD-10-CM | POA: Diagnosis not present

## 2019-01-02 ENCOUNTER — Ambulatory Visit: Payer: Medicare HMO | Admitting: Internal Medicine

## 2019-01-14 DIAGNOSIS — M79675 Pain in left toe(s): Secondary | ICD-10-CM | POA: Diagnosis not present

## 2019-01-14 DIAGNOSIS — L600 Ingrowing nail: Secondary | ICD-10-CM | POA: Diagnosis not present

## 2019-01-16 ENCOUNTER — Other Ambulatory Visit: Payer: Self-pay | Admitting: Internal Medicine

## 2019-02-24 ENCOUNTER — Emergency Department (HOSPITAL_COMMUNITY): Payer: Medicare HMO

## 2019-02-24 ENCOUNTER — Encounter (HOSPITAL_COMMUNITY): Admission: EM | Disposition: A | Discharge status: Home / Self Care | Payer: Self-pay | Source: Home / Self Care

## 2019-02-24 ENCOUNTER — Emergency Department (HOSPITAL_COMMUNITY): Payer: Medicare HMO | Admitting: Anesthesiology

## 2019-02-24 ENCOUNTER — Inpatient Hospital Stay (HOSPITAL_COMMUNITY)
Admission: EM | Admit: 2019-02-24 | Discharge: 2019-02-25 | DRG: 418 | Disposition: A | Discharge status: Home / Self Care | Payer: Medicare HMO | Attending: Surgery | Admitting: Surgery

## 2019-02-24 ENCOUNTER — Other Ambulatory Visit: Payer: Self-pay

## 2019-02-24 ENCOUNTER — Encounter (HOSPITAL_COMMUNITY): Payer: Self-pay | Admitting: Emergency Medicine

## 2019-02-24 DIAGNOSIS — Z90710 Acquired absence of both cervix and uterus: Secondary | ICD-10-CM

## 2019-02-24 DIAGNOSIS — Z91041 Radiographic dye allergy status: Secondary | ICD-10-CM

## 2019-02-24 DIAGNOSIS — Z79899 Other long term (current) drug therapy: Secondary | ICD-10-CM

## 2019-02-24 DIAGNOSIS — K915 Postcholecystectomy syndrome: Secondary | ICD-10-CM | POA: Diagnosis not present

## 2019-02-24 DIAGNOSIS — Z7984 Long term (current) use of oral hypoglycemic drugs: Secondary | ICD-10-CM | POA: Diagnosis not present

## 2019-02-24 DIAGNOSIS — K8010 Calculus of gallbladder with chronic cholecystitis without obstruction: Secondary | ICD-10-CM | POA: Diagnosis not present

## 2019-02-24 DIAGNOSIS — Z888 Allergy status to other drugs, medicaments and biological substances status: Secondary | ICD-10-CM | POA: Diagnosis not present

## 2019-02-24 DIAGNOSIS — Z79890 Hormone replacement therapy (postmenopausal): Secondary | ICD-10-CM

## 2019-02-24 DIAGNOSIS — E875 Hyperkalemia: Secondary | ICD-10-CM | POA: Diagnosis present

## 2019-02-24 DIAGNOSIS — M1712 Unilateral primary osteoarthritis, left knee: Secondary | ICD-10-CM | POA: Diagnosis not present

## 2019-02-24 DIAGNOSIS — J4540 Moderate persistent asthma, uncomplicated: Secondary | ICD-10-CM | POA: Diagnosis present

## 2019-02-24 DIAGNOSIS — Z6841 Body Mass Index (BMI) 40.0 and over, adult: Secondary | ICD-10-CM

## 2019-02-24 DIAGNOSIS — Z833 Family history of diabetes mellitus: Secondary | ICD-10-CM | POA: Diagnosis not present

## 2019-02-24 DIAGNOSIS — M1990 Unspecified osteoarthritis, unspecified site: Secondary | ICD-10-CM | POA: Diagnosis present

## 2019-02-24 DIAGNOSIS — E119 Type 2 diabetes mellitus without complications: Secondary | ICD-10-CM | POA: Diagnosis present

## 2019-02-24 DIAGNOSIS — Z87891 Personal history of nicotine dependence: Secondary | ICD-10-CM

## 2019-02-24 DIAGNOSIS — Z96659 Presence of unspecified artificial knee joint: Secondary | ICD-10-CM | POA: Diagnosis present

## 2019-02-24 DIAGNOSIS — Z880 Allergy status to penicillin: Secondary | ICD-10-CM | POA: Diagnosis not present

## 2019-02-24 DIAGNOSIS — I10 Essential (primary) hypertension: Secondary | ICD-10-CM | POA: Diagnosis not present

## 2019-02-24 DIAGNOSIS — E785 Hyperlipidemia, unspecified: Secondary | ICD-10-CM | POA: Diagnosis present

## 2019-02-24 DIAGNOSIS — Z91040 Latex allergy status: Secondary | ICD-10-CM

## 2019-02-24 DIAGNOSIS — J45909 Unspecified asthma, uncomplicated: Secondary | ICD-10-CM | POA: Diagnosis present

## 2019-02-24 DIAGNOSIS — Z1159 Encounter for screening for other viral diseases: Secondary | ICD-10-CM

## 2019-02-24 DIAGNOSIS — K8070 Calculus of gallbladder and bile duct without cholecystitis without obstruction: Secondary | ICD-10-CM | POA: Diagnosis not present

## 2019-02-24 DIAGNOSIS — Z8249 Family history of ischemic heart disease and other diseases of the circulatory system: Secondary | ICD-10-CM | POA: Diagnosis not present

## 2019-02-24 DIAGNOSIS — E860 Dehydration: Secondary | ICD-10-CM | POA: Diagnosis present

## 2019-02-24 DIAGNOSIS — Z20828 Contact with and (suspected) exposure to other viral communicable diseases: Secondary | ICD-10-CM | POA: Diagnosis not present

## 2019-02-24 DIAGNOSIS — K8000 Calculus of gallbladder with acute cholecystitis without obstruction: Secondary | ICD-10-CM | POA: Diagnosis not present

## 2019-02-24 DIAGNOSIS — Z825 Family history of asthma and other chronic lower respiratory diseases: Secondary | ICD-10-CM

## 2019-02-24 DIAGNOSIS — K5909 Other constipation: Secondary | ICD-10-CM | POA: Diagnosis present

## 2019-02-24 DIAGNOSIS — K8020 Calculus of gallbladder without cholecystitis without obstruction: Secondary | ICD-10-CM | POA: Diagnosis present

## 2019-02-24 DIAGNOSIS — Z419 Encounter for procedure for purposes other than remedying health state, unspecified: Secondary | ICD-10-CM

## 2019-02-24 DIAGNOSIS — E039 Hypothyroidism, unspecified: Secondary | ICD-10-CM | POA: Diagnosis not present

## 2019-02-24 HISTORY — PX: CHOLECYSTECTOMY: SHX55

## 2019-02-24 LAB — COMPREHENSIVE METABOLIC PANEL
ALT: 13 U/L (ref 0–44)
AST: 35 U/L (ref 15–41)
Albumin: 3.6 g/dL (ref 3.5–5.0)
Alkaline Phosphatase: 106 U/L (ref 38–126)
Anion gap: 10 (ref 5–15)
BUN: 14 mg/dL (ref 8–23)
CO2: 24 mmol/L (ref 22–32)
Calcium: 9 mg/dL (ref 8.9–10.3)
Chloride: 102 mmol/L (ref 98–111)
Creatinine, Ser: 1.24 mg/dL — ABNORMAL HIGH (ref 0.44–1.00)
GFR calc Af Amer: 53 mL/min — ABNORMAL LOW (ref >60)
GFR calc non Af Amer: 46 mL/min — ABNORMAL LOW (ref >60)
Glucose, Bld: 187 mg/dL — ABNORMAL HIGH (ref 70–99)
Potassium: 5.2 mmol/L — ABNORMAL HIGH (ref 3.5–5.1)
Sodium: 136 mmol/L (ref 135–145)
Total Bilirubin: 1.8 mg/dL — ABNORMAL HIGH (ref 0.3–1.2)
Total Protein: 8 g/dL (ref 6.5–8.1)

## 2019-02-24 LAB — CBC WITH DIFFERENTIAL/PLATELET
Abs Immature Granulocytes: 0.02 10*3/uL (ref 0.00–0.07)
Basophils Absolute: 0.1 10*3/uL (ref 0.0–0.1)
Basophils Relative: 1 %
Eosinophils Absolute: 0.2 10*3/uL (ref 0.0–0.5)
Eosinophils Relative: 2 %
HCT: 44.5 % (ref 36.0–46.0)
Hemoglobin: 15.3 g/dL — ABNORMAL HIGH (ref 12.0–15.0)
Immature Granulocytes: 0 %
Lymphocytes Relative: 15 %
Lymphs Abs: 1.6 10*3/uL (ref 0.7–4.0)
MCH: 31 pg (ref 26.0–34.0)
MCHC: 34.4 g/dL (ref 30.0–36.0)
MCV: 90.1 fL (ref 80.0–100.0)
Monocytes Absolute: 0.3 10*3/uL (ref 0.1–1.0)
Monocytes Relative: 3 %
Neutro Abs: 8.2 10*3/uL — ABNORMAL HIGH (ref 1.7–7.7)
Neutrophils Relative %: 79 %
Platelets: 252 10*3/uL (ref 150–400)
RBC: 4.94 MIL/uL (ref 3.87–5.11)
RDW: 12.5 % (ref 11.5–15.5)
WBC: 10.4 10*3/uL (ref 4.0–10.5)
nRBC: 0 % (ref 0.0–0.2)

## 2019-02-24 LAB — URINALYSIS, ROUTINE W REFLEX MICROSCOPIC
Bacteria, UA: NONE SEEN
Bilirubin Urine: NEGATIVE
Glucose, UA: NEGATIVE mg/dL
Ketones, ur: 5 mg/dL — AB
Leukocytes,Ua: NEGATIVE
Nitrite: NEGATIVE
Protein, ur: NEGATIVE mg/dL
Specific Gravity, Urine: 1.024 (ref 1.005–1.030)
pH: 6 (ref 5.0–8.0)

## 2019-02-24 LAB — GLUCOSE, CAPILLARY
Glucose-Capillary: 121 mg/dL — ABNORMAL HIGH (ref 70–99)
Glucose-Capillary: 216 mg/dL — ABNORMAL HIGH (ref 70–99)
Glucose-Capillary: 233 mg/dL — ABNORMAL HIGH (ref 70–99)

## 2019-02-24 LAB — SARS CORONAVIRUS 2: SARS Coronavirus 2: NOT DETECTED

## 2019-02-24 SURGERY — LAPAROSCOPIC CHOLECYSTECTOMY SINGLE SITE WITH INTRAOPERATIVE CHOLANGIOGRAM
Anesthesia: General

## 2019-02-24 SURGERY — LAPAROSCOPIC CHOLECYSTECTOMY WITH INTRAOPERATIVE CHOLANGIOGRAM
Anesthesia: General | Site: Abdomen

## 2019-02-24 MED ORDER — CYCLOSPORINE 0.05 % OP EMUL
1.0000 [drp] | Freq: Two times a day (BID) | OPHTHALMIC | Status: DC
  Administered 2019-02-24 – 2019-02-25 (×2): 1 [drp] via OPHTHALMIC
  Filled 2019-02-24 (×2): qty 30

## 2019-02-24 MED ORDER — TIMOLOL MALEATE 0.5 % OP SOLN
1.0000 [drp] | Freq: Two times a day (BID) | OPHTHALMIC | Status: DC
  Administered 2019-02-24 – 2019-02-25 (×2): 1 [drp] via OPHTHALMIC
  Filled 2019-02-24: qty 5

## 2019-02-24 MED ORDER — ENOXAPARIN SODIUM 40 MG/0.4ML ~~LOC~~ SOLN
40.0000 mg | SUBCUTANEOUS | Status: DC
  Administered 2019-02-25: 40 mg via SUBCUTANEOUS
  Filled 2019-02-24: qty 0.4

## 2019-02-24 MED ORDER — LIDOCAINE 2% (20 MG/ML) 5 ML SYRINGE
INTRAMUSCULAR | Status: AC
  Filled 2019-02-24: qty 5

## 2019-02-24 MED ORDER — KETOROLAC TROMETHAMINE 30 MG/ML IJ SOLN
30.0000 mg | Freq: Once | INTRAMUSCULAR | Status: AC
  Administered 2019-02-24: 30 mg via INTRAVENOUS

## 2019-02-24 MED ORDER — AMLODIPINE BESYLATE 5 MG PO TABS
5.0000 mg | ORAL_TABLET | Freq: Every day | ORAL | Status: DC
  Administered 2019-02-25: 10:00:00 5 mg via ORAL
  Filled 2019-02-24: qty 1

## 2019-02-24 MED ORDER — ROCURONIUM BROMIDE 10 MG/ML (PF) SYRINGE
PREFILLED_SYRINGE | INTRAVENOUS | Status: AC
  Filled 2019-02-24: qty 10

## 2019-02-24 MED ORDER — BUPIVACAINE-EPINEPHRINE 0.25% -1:200000 IJ SOLN
INTRAMUSCULAR | Status: DC | PRN
  Administered 2019-02-24: 9 mL

## 2019-02-24 MED ORDER — SODIUM CHLORIDE 0.9 % IV SOLN
INTRAVENOUS | Status: DC | PRN
  Administered 2019-02-24: 10 mL

## 2019-02-24 MED ORDER — BRIMONIDINE TARTRATE 0.2 % OP SOLN
1.0000 [drp] | Freq: Two times a day (BID) | OPHTHALMIC | Status: DC
  Administered 2019-02-24 – 2019-02-25 (×2): 1 [drp] via OPHTHALMIC
  Filled 2019-02-24: qty 5

## 2019-02-24 MED ORDER — ONDANSETRON HCL 4 MG/2ML IJ SOLN
INTRAMUSCULAR | Status: DC | PRN
  Administered 2019-02-24: 4 mg via INTRAVENOUS

## 2019-02-24 MED ORDER — HYDROCHLOROTHIAZIDE 12.5 MG PO CAPS
12.5000 mg | ORAL_CAPSULE | Freq: Every day | ORAL | Status: DC
  Administered 2019-02-25: 12.5 mg via ORAL
  Filled 2019-02-24: qty 1

## 2019-02-24 MED ORDER — DEXAMETHASONE SODIUM PHOSPHATE 10 MG/ML IJ SOLN
INTRAMUSCULAR | Status: DC | PRN
  Administered 2019-02-24: 10 mg via INTRAVENOUS

## 2019-02-24 MED ORDER — ONDANSETRON 4 MG PO TBDP: 4.0000 mg | ORAL_TABLET | Freq: Four times a day (QID) | ORAL | Status: DC | PRN

## 2019-02-24 MED ORDER — SODIUM CHLORIDE 0.9 % IV SOLN
INTRAVENOUS | Status: DC
  Administered 2019-02-24: via INTRAVENOUS

## 2019-02-24 MED ORDER — FENTANYL CITRATE (PF) 250 MCG/5ML IJ SOLN
INTRAMUSCULAR | Status: AC
  Filled 2019-02-24: qty 5

## 2019-02-24 MED ORDER — POLYETHYLENE GLYCOL 3350 17 G PO PACK: 17.0000 g | PACK | Freq: Every day | ORAL | Status: DC | PRN

## 2019-02-24 MED ORDER — MIDAZOLAM HCL 5 MG/5ML IJ SOLN
INTRAMUSCULAR | Status: DC | PRN
  Administered 2019-02-24: 2 mg via INTRAVENOUS

## 2019-02-24 MED ORDER — FENTANYL CITRATE (PF) 100 MCG/2ML IJ SOLN
INTRAMUSCULAR | Status: DC | PRN
  Administered 2019-02-24 (×2): 100 ug via INTRAVENOUS
  Administered 2019-02-24: 50 ug via INTRAVENOUS

## 2019-02-24 MED ORDER — ONDANSETRON HCL 4 MG/2ML IJ SOLN
4.0000 mg | Freq: Once | INTRAMUSCULAR | Status: AC
  Administered 2019-02-24: 4 mg via INTRAVENOUS
  Filled 2019-02-24: qty 2

## 2019-02-24 MED ORDER — LACTATED RINGERS IV SOLN
INTRAVENOUS | Status: DC | PRN
  Administered 2019-02-24 (×2): via INTRAVENOUS

## 2019-02-24 MED ORDER — SUCCINYLCHOLINE CHLORIDE 200 MG/10ML IV SOSY
PREFILLED_SYRINGE | INTRAVENOUS | Status: AC
  Filled 2019-02-24: qty 10

## 2019-02-24 MED ORDER — OXYCODONE HCL 5 MG PO TABS
5.0000 mg | ORAL_TABLET | ORAL | Status: DC | PRN
  Administered 2019-02-24: 10 mg via ORAL
  Filled 2019-02-24: qty 2

## 2019-02-24 MED ORDER — INSULIN ASPART 100 UNIT/ML ~~LOC~~ SOLN
0.0000 [IU] | Freq: Three times a day (TID) | SUBCUTANEOUS | Status: DC
  Administered 2019-02-25: 4 [IU] via SUBCUTANEOUS

## 2019-02-24 MED ORDER — PROPOFOL 10 MG/ML IV BOLUS
INTRAVENOUS | Status: AC
  Filled 2019-02-24: qty 20

## 2019-02-24 MED ORDER — DOCUSATE SODIUM 100 MG PO CAPS
100.0000 mg | ORAL_CAPSULE | Freq: Two times a day (BID) | ORAL | Status: DC
  Administered 2019-02-24 – 2019-02-25 (×2): 100 mg via ORAL
  Filled 2019-02-24 (×2): qty 1

## 2019-02-24 MED ORDER — SUGAMMADEX SODIUM 200 MG/2ML IV SOLN
INTRAVENOUS | Status: DC | PRN
  Administered 2019-02-24: 200 mg via INTRAVENOUS

## 2019-02-24 MED ORDER — ROCURONIUM BROMIDE 50 MG/5ML IV SOSY
PREFILLED_SYRINGE | INTRAVENOUS | Status: DC | PRN
  Administered 2019-02-24: 60 mg via INTRAVENOUS

## 2019-02-24 MED ORDER — ACETAMINOPHEN 500 MG PO TABS
1000.0000 mg | ORAL_TABLET | Freq: Four times a day (QID) | ORAL | Status: DC
  Administered 2019-02-24 – 2019-02-25 (×2): 1000 mg via ORAL
  Filled 2019-02-24 (×2): qty 2

## 2019-02-24 MED ORDER — PROPOFOL 10 MG/ML IV BOLUS
INTRAVENOUS | Status: DC | PRN
  Administered 2019-02-24: 150 mg via INTRAVENOUS

## 2019-02-24 MED ORDER — LISINOPRIL 20 MG PO TABS
20.0000 mg | ORAL_TABLET | Freq: Every day | ORAL | Status: DC
  Administered 2019-02-25: 20 mg via ORAL
  Filled 2019-02-24: qty 1

## 2019-02-24 MED ORDER — ONDANSETRON HCL 4 MG/2ML IJ SOLN: 4.0000 mg | Freq: Four times a day (QID) | INTRAMUSCULAR | Status: DC | PRN

## 2019-02-24 MED ORDER — SUCCINYLCHOLINE CHLORIDE 20 MG/ML IJ SOLN
INTRAMUSCULAR | Status: DC | PRN
  Administered 2019-02-24: 140 mg via INTRAVENOUS

## 2019-02-24 MED ORDER — MIDAZOLAM HCL 2 MG/2ML IJ SOLN
INTRAMUSCULAR | Status: AC
  Filled 2019-02-24: qty 2

## 2019-02-24 MED ORDER — MORPHINE SULFATE (PF) 2 MG/ML IV SOLN
2.0000 mg | INTRAVENOUS | Status: DC | PRN
  Filled 2019-02-24: qty 2

## 2019-02-24 MED ORDER — ACETAMINOPHEN 10 MG/ML IV SOLN
1000.0000 mg | Freq: Once | INTRAVENOUS | Status: AC
  Administered 2019-02-24: 1000 mg via INTRAVENOUS

## 2019-02-24 MED ORDER — ONDANSETRON HCL 4 MG/2ML IJ SOLN
INTRAMUSCULAR | Status: AC
  Filled 2019-02-24: qty 2

## 2019-02-24 MED ORDER — HYDROMORPHONE HCL 1 MG/ML IJ SOLN
0.2500 mg | INTRAMUSCULAR | Status: DC | PRN
  Administered 2019-02-24 (×2): 0.25 mg via INTRAVENOUS

## 2019-02-24 MED ORDER — LATANOPROST 0.005 % OP SOLN
1.0000 [drp] | Freq: Every day | OPHTHALMIC | Status: DC
  Administered 2019-02-24: 1 [drp] via OPHTHALMIC
  Filled 2019-02-24: qty 2.5

## 2019-02-24 MED ORDER — SODIUM CHLORIDE 0.9 % IV SOLN
2.0000 g | INTRAVENOUS | Status: DC
  Filled 2019-02-24: qty 20

## 2019-02-24 MED ORDER — LIDOCAINE 2% (20 MG/ML) 5 ML SYRINGE
INTRAMUSCULAR | Status: DC | PRN
  Administered 2019-02-24: 100 mg via INTRAVENOUS

## 2019-02-24 MED ORDER — HYDRALAZINE HCL 20 MG/ML IJ SOLN: 10.0000 mg | INTRAMUSCULAR | Status: DC | PRN

## 2019-02-24 MED ORDER — BUPIVACAINE-EPINEPHRINE (PF) 0.25% -1:200000 IJ SOLN
INTRAMUSCULAR | Status: AC
  Filled 2019-02-24: qty 30

## 2019-02-24 MED ORDER — DIPHENHYDRAMINE HCL 50 MG/ML IJ SOLN: 12.5000 mg | Freq: Four times a day (QID) | INTRAMUSCULAR | Status: DC | PRN

## 2019-02-24 MED ORDER — MORPHINE SULFATE (PF) 4 MG/ML IV SOLN
4.0000 mg | Freq: Once | INTRAVENOUS | Status: AC
  Administered 2019-02-24: 4 mg via INTRAVENOUS
  Filled 2019-02-24: qty 1

## 2019-02-24 MED ORDER — 0.9 % SODIUM CHLORIDE (POUR BTL) OPTIME
TOPICAL | Status: DC | PRN
  Administered 2019-02-24: 1000 mL

## 2019-02-24 MED ORDER — DEXAMETHASONE SODIUM PHOSPHATE 10 MG/ML IJ SOLN
INTRAMUSCULAR | Status: AC
  Filled 2019-02-24: qty 1

## 2019-02-24 MED ORDER — ACETAMINOPHEN 10 MG/ML IV SOLN
INTRAVENOUS | Status: AC
  Filled 2019-02-24: qty 100

## 2019-02-24 MED ORDER — LEVOTHYROXINE SODIUM 88 MCG PO TABS
88.0000 ug | ORAL_TABLET | Freq: Every day | ORAL | Status: DC
  Administered 2019-02-25: 88 ug via ORAL
  Filled 2019-02-24: qty 1

## 2019-02-24 MED ORDER — HYDROMORPHONE HCL 1 MG/ML IJ SOLN
INTRAMUSCULAR | Status: AC
  Filled 2019-02-24: qty 1

## 2019-02-24 MED ORDER — PANTOPRAZOLE SODIUM 40 MG IV SOLR
40.0000 mg | Freq: Every day | INTRAVENOUS | Status: DC
  Administered 2019-02-24: 40 mg via INTRAVENOUS
  Filled 2019-02-24: qty 40

## 2019-02-24 MED ORDER — STERILE WATER FOR IRRIGATION IR SOLN
Status: DC | PRN
  Administered 2019-02-24: 1000 mL

## 2019-02-24 MED ORDER — BRIMONIDINE TARTRATE-TIMOLOL 0.2-0.5 % OP SOLN: 1.0000 [drp] | Freq: Two times a day (BID) | OPHTHALMIC | Status: DC

## 2019-02-24 MED ORDER — SODIUM CHLORIDE 0.9 % IR SOLN
Status: DC | PRN
  Administered 2019-02-24: 1000 mL

## 2019-02-24 MED ORDER — KETOROLAC TROMETHAMINE 30 MG/ML IJ SOLN
INTRAMUSCULAR | Status: AC
  Filled 2019-02-24: qty 1

## 2019-02-24 MED ORDER — DIPHENHYDRAMINE HCL 12.5 MG/5ML PO ELIX: 12.5000 mg | ORAL_SOLUTION | Freq: Four times a day (QID) | ORAL | Status: DC | PRN

## 2019-02-24 MED ORDER — SODIUM CHLORIDE 0.9 % IV SOLN
2.0000 g | Freq: Once | INTRAVENOUS | Status: AC
  Administered 2019-02-24: 2 g via INTRAVENOUS
  Filled 2019-02-24: qty 20

## 2019-02-24 SURGICAL SUPPLY — 47 items
APL PRP STRL LF DISP 70% ISPRP (MISCELLANEOUS) ×1
APL SKNCLS STERI-STRIP NONHPOA (GAUZE/BANDAGES/DRESSINGS) ×1
APPLIER CLIP ROT 10 11.4 M/L (STAPLE) ×2
APR CLP MED LRG 11.4X10 (STAPLE) ×1
BAG SPEC RTRVL LRG 6X4 10 (ENDOMECHANICALS) ×1
BENZOIN TINCTURE PRP APPL 2/3 (GAUZE/BANDAGES/DRESSINGS) ×2 IMPLANT
CANISTER SUCT 3000ML PPV (MISCELLANEOUS) ×2 IMPLANT
CHLORAPREP W/TINT 26 (MISCELLANEOUS) ×2 IMPLANT
CLIP APPLIE ROT 10 11.4 M/L (STAPLE) ×1 IMPLANT
COVER MAYO STAND STRL (DRAPES) ×2 IMPLANT
COVER SURGICAL LIGHT HANDLE (MISCELLANEOUS) ×2 IMPLANT
DRAPE C-ARM 42X72 X-RAY (DRAPES) ×2 IMPLANT
DRSG TEGADERM 2-3/8X2-3/4 SM (GAUZE/BANDAGES/DRESSINGS) ×2 IMPLANT
DRSG TEGADERM 4X4.75 (GAUZE/BANDAGES/DRESSINGS) ×2 IMPLANT
ELECT REM PT RETURN 9FT ADLT (ELECTROSURGICAL) ×2
ELECTRODE REM PT RTRN 9FT ADLT (ELECTROSURGICAL) ×1 IMPLANT
GAUZE SPONGE 2X2 8PLY STRL LF (GAUZE/BANDAGES/DRESSINGS) ×1 IMPLANT
GLOVE BIO SURGEON STRL SZ7 (GLOVE) ×2 IMPLANT
GLOVE BIOGEL PI IND STRL 7.0 (GLOVE) ×2 IMPLANT
GLOVE BIOGEL PI IND STRL 7.5 (GLOVE) ×2 IMPLANT
GLOVE BIOGEL PI INDICATOR 7.0 (GLOVE) ×2
GLOVE BIOGEL PI INDICATOR 7.5 (GLOVE) ×2
GLOVE SURG SS PI 6.5 STRL IVOR (GLOVE) ×2 IMPLANT
GLOVE SURG SS PI 7.0 STRL IVOR (GLOVE) ×4 IMPLANT
GLOVE SURG SS PI 7.5 STRL IVOR (GLOVE) ×2 IMPLANT
GOWN STRL REUS W/ TWL LRG LVL3 (GOWN DISPOSABLE) ×3 IMPLANT
GOWN STRL REUS W/TWL LRG LVL3 (GOWN DISPOSABLE) ×6
KIT BASIN OR (CUSTOM PROCEDURE TRAY) ×2 IMPLANT
KIT TURNOVER KIT B (KITS) ×2 IMPLANT
NS IRRIG 1000ML POUR BTL (IV SOLUTION) ×2 IMPLANT
PAD ARMBOARD 7.5X6 YLW CONV (MISCELLANEOUS) ×2 IMPLANT
POUCH SPECIMEN RETRIEVAL 10MM (ENDOMECHANICALS) ×2 IMPLANT
SCISSORS LAP 5X35 DISP (ENDOMECHANICALS) ×2 IMPLANT
SET CHOLANGIOGRAPH 5 50 .035 (SET/KITS/TRAYS/PACK) ×2 IMPLANT
SET IRRIG TUBING LAPAROSCOPIC (IRRIGATION / IRRIGATOR) ×2 IMPLANT
SET TUBE SMOKE EVAC HIGH FLOW (TUBING) ×2 IMPLANT
SLEEVE ENDOPATH XCEL 5M (ENDOMECHANICALS) ×2 IMPLANT
SPECIMEN JAR SMALL (MISCELLANEOUS) ×2 IMPLANT
SPONGE GAUZE 2X2 STER 10/PKG (GAUZE/BANDAGES/DRESSINGS) ×1
STRIP CLOSURE SKIN 1/2X4 (GAUZE/BANDAGES/DRESSINGS) ×2 IMPLANT
SUT MNCRL AB 4-0 PS2 18 (SUTURE) ×2 IMPLANT
SUT VICRYL 0 UR6 27IN ABS (SUTURE) ×2 IMPLANT
TRAY LAPAROSCOPIC MC (CUSTOM PROCEDURE TRAY) ×2 IMPLANT
TROCAR XCEL BLUNT TIP 100MML (ENDOMECHANICALS) ×2 IMPLANT
TROCAR XCEL NON-BLD 11X100MML (ENDOMECHANICALS) ×2 IMPLANT
TROCAR XCEL NON-BLD 5MMX100MML (ENDOMECHANICALS) ×2 IMPLANT
WATER STERILE IRR 1000ML POUR (IV SOLUTION) ×2 IMPLANT

## 2019-02-24 NOTE — Anesthesia Procedure Notes (Signed)
Procedure Name: Intubation Date/Time: 02/24/2019 2:40 PM Performed by: Claris Che, CRNA Pre-anesthesia Checklist: Timeout performed, Patient being monitored, Suction available, Emergency Drugs available and Patient identified Patient Re-evaluated:Patient Re-evaluated prior to induction Oxygen Delivery Method: Circle system utilized Preoxygenation: Pre-oxygenation with 100% oxygen Induction Type: IV induction Ventilation: Mask ventilation without difficulty Laryngoscope Size: Mac and 3 Grade View: Grade I Tube type: Oral Tube size: 7.0 mm Number of attempts: 1 Placement Confirmation: ETT inserted through vocal cords under direct vision,  positive ETCO2 and breath sounds checked- equal and bilateral Secured at: 22 cm Tube secured with: Tape Dental Injury: Teeth and Oropharynx as per pre-operative assessment

## 2019-02-24 NOTE — Anesthesia Postprocedure Evaluation (Signed)
Anesthesia Post Note  Patient: Kayla Bean  Procedure(s) Performed: LAPAROSCOPIC CHOLECYSTECTOMY WITH INTRAOPERATIVE CHOLANGIOGRAM (N/A Abdomen)     Patient location during evaluation: PACU Anesthesia Type: General Level of consciousness: awake and alert Pain management: pain level controlled Vital Signs Assessment: post-procedure vital signs reviewed and stable Respiratory status: spontaneous breathing, nonlabored ventilation and respiratory function stable Cardiovascular status: blood pressure returned to baseline and stable Postop Assessment: no apparent nausea or vomiting Anesthetic complications: no    Last Vitals:  Vitals:   02/24/19 1645 02/24/19 1700  BP: 138/84 (!) 142/88  Pulse: 67 (!) 59  Resp: 15 14  Temp:    SpO2: 99% 98%    Last Pain:  Vitals:   02/24/19 1652  TempSrc:   PainSc: 10-Worst pain ever                 Elnore Cosens,W. EDMOND

## 2019-02-24 NOTE — Transfer of Care (Signed)
Immediate Anesthesia Transfer of Care Note  Patient: Kayla Bean  Procedure(s) Performed: LAPAROSCOPIC CHOLECYSTECTOMY WITH INTRAOPERATIVE CHOLANGIOGRAM (N/A Abdomen)  Patient Location: PACU  Anesthesia Type:General  Level of Consciousness: sedated, drowsy, patient cooperative and responds to stimulation  Airway & Oxygen Therapy: Patient Spontanous Breathing and Patient connected to nasal cannula oxygen  Post-op Assessment: Report given to RN, Post -op Vital signs reviewed and stable and Patient moving all extremities X 4  Post vital signs: Reviewed and stable  Last Vitals:  Vitals Value Taken Time  BP 137/79 02/24/2019  4:15 PM  Temp 36.4 C 02/24/2019  4:15 PM  Pulse 68 02/24/2019  4:16 PM  Resp 21 02/24/2019  4:16 PM  SpO2 100 % 02/24/2019  4:16 PM  Vitals shown include unvalidated device data.  Last Pain:  Vitals:   02/24/19 1138  TempSrc:   PainSc: 6          Complications: No apparent anesthesia complications

## 2019-02-24 NOTE — ED Notes (Signed)
Patient is taking her cellphone with her .

## 2019-02-24 NOTE — ED Triage Notes (Signed)
Pt. Stated, I think I have food poison I ate some chicken on Friday and I got sick on Sat. With stomach pain and I was throwing up and going to bathroom. I threw up this morning, and it was trying to come out but would not. It doesn't feel like constipation.

## 2019-02-24 NOTE — Anesthesia Preprocedure Evaluation (Addendum)
Anesthesia Evaluation  Patient identified by MRN, date of birth, ID band Patient awake    Reviewed: Allergy & Precautions, H&P , NPO status , Patient's Chart, lab work & pertinent test results  Airway Mallampati: I  TM Distance: >3 FB Neck ROM: full    Dental no notable dental hx. (+) Edentulous Upper, Dental Advidsory Given   Pulmonary asthma , former smoker,    Pulmonary exam normal breath sounds clear to auscultation       Cardiovascular hypertension, Pt. on medications  Rhythm:regular Rate:Normal     Neuro/Psych negative neurological ROS  negative psych ROS   GI/Hepatic negative GI ROS, Neg liver ROS,   Endo/Other  diabetes, Type 2, Oral Hypoglycemic AgentsHypothyroidism Morbid obesity  Renal/GU negative Renal ROS  negative genitourinary   Musculoskeletal  (+) Arthritis , Osteoarthritis,    Abdominal   Peds  Hematology negative hematology ROS (+)   Anesthesia Other Findings   Reproductive/Obstetrics negative OB ROS                           Anesthesia Physical Anesthesia Plan  ASA: III  Anesthesia Plan: General   Post-op Pain Management:    Induction: Intravenous  PONV Risk Score and Plan: 4 or greater and Ondansetron, Dexamethasone and Midazolam  Airway Management Planned: Oral ETT  Additional Equipment:   Intra-op Plan:   Post-operative Plan: Extubation in OR  Informed Consent: I have reviewed the patients History and Physical, chart, labs and discussed the procedure including the risks, benefits and alternatives for the proposed anesthesia with the patient or authorized representative who has indicated his/her understanding and acceptance.     Dental advisory given and Dental Advisory Given  Plan Discussed with: CRNA  Anesthesia Plan Comments:        Anesthesia Quick Evaluation

## 2019-02-24 NOTE — ED Notes (Signed)
Patient transported to CT 

## 2019-02-24 NOTE — ED Notes (Signed)
Op- permit signed.  

## 2019-02-24 NOTE — ED Notes (Signed)
Report called to [facility], patient will go to [facility]

## 2019-02-24 NOTE — OR Nursing (Signed)
MD aware of patient previous allergy with IV contrast die.

## 2019-02-24 NOTE — ED Provider Notes (Signed)
[facility] EMERGENCY DEPARTMENT Provider Note   CSN: [id number] Arrival date & time: 02/24/19  0814    History   Chief Complaint Chief Complaint  Patient presents with   Abdominal Pain    HPI Kayla Bean is a 63 y.o. female presenting for evaluation of nausea, vomiting, diarrhea, and abdominal pain.  Patient states 4 days ago she cooked chicken wings, and the next day she developed nausea, vomiting, and diarrhea.  She could not eat although due to her symptoms, but they resolved within 24 hours.  Yesterday she was able to eat normally, had pork chops, gravy, and squash.  This morning when she woke up, she had return of her nausea, vomiting, and abdominal pain.  She has not had any diarrhea today, did have a normal bowel movement without blood.  Patient reports her abdomen feels very full and like it is pushing on the upper part of her stomach and her lungs.  She denies known fevers, chills, sore throat, cough, chest pain, shortness of breath.  She has vomited once today, states it was mucus/bile.  It is nonbloody.  She has not had anything to eat today.  Her pain is of the upper abdomen, it is constant and worse on the right side.  She denies urinary symptoms such as dysuria, hematuria, urinary frequency.  She has a history of diabetes for which she takes glipizide and Januvia, she does not check her blood sugars because she does not like sticking her finger.  She does not feel like her blood sugars are high or low.  She denies a history of gastroparesis due to her diabetes.  Additional history of asthma, hypertension, hypothyroidism. She did take her home meds today.  Patient reports a history of a hysterectomy for which she has a midline abdominal scar from her umbilicus to suprapubic abdomen.  She denies previous history of bowel obstruction.     HPI  Past Medical History:  Diagnosis Date   Arthritis    Asthma    Diabetes mellitus    Heart murmur     Hemorrhoids    Hypertension    Thyroid disease    hypothyroidism    Patient Active Problem List   Diagnosis Date Noted   Class 3 severe obesity with serious comorbidity and body mass index (BMI) of 45.0 to 49.9 in adult [facility]) 10/01/2018   Moderate persistent asthma without complication 48/18/5631   Hyperlipidemia 03/22/2017   Cholelithiasis 12/24/2012   LIPOMA 07/26/2010   SEBACEOUS CYST 11/19/2009   CONSTIPATION, CHRONIC 01/18/2009   EUSTACHIAN TUBE DYSFUNCTION, RIGHT 10/16/2008   LOM 09/01/2008   Essential hypertension 08/18/2008   Osteoarthritis of left knee 04/23/2008   PLANTAR FASCIITIS, RIGHT 04/23/2008   Hypothyroidism 10/15/2007    Past Surgical History:  Procedure Laterality Date   ABDOMINAL HYSTERECTOMY     KNEE SURGERY     left   THYROIDECTOMY     TOTAL KNEE ARTHROPLASTY     right knee     OB History   No obstetric history on file.      Home Medications    Prior to Admission medications   Medication Sig Start Date End Date Taking? Authorizing Provider  Albuterol Sulfate (PROAIR RESPICLICK) 497 (90 Base) MCG/ACT AEPB Inhale 2 puffs into the lungs every 6 (six) hours as needed (for breathing). 03/22/17   Ladell Pier, MD  amLODipine (NORVASC) 5 MG tablet Take 1 tablet (5 mg total) by mouth daily. 10/01/18   Wynetta Emery,  Dalbert Batman, MD  aspirin 81 MG tablet Take 1 tablet (81 mg total) daily by mouth. 07/30/17   Ladell Pier, MD  Blood Glucose Monitoring Suppl (TRUE METRIX METER) w/Device KIT Use as directed 05/10/17   Ladell Pier, MD  brimonidine-timolol (COMBIGAN) 0.2-0.5 % ophthalmic solution Place 1 drop into both eyes every 12 (twelve) hours.    [provider]  cetirizine (ZYRTEC) 10 MG chewable tablet Chew 1 tablet (10 mg total) by mouth daily. 08/23/18   Wurst, [place], PA-C  cycloSPORINE (RESTASIS) 0.05 % ophthalmic emulsion Place 1 drop into both eyes 2 (two) times daily.    [provider]  fluticasone  (FLONASE) 50 MCG/ACT nasal spray Place 2 sprays into both nostrils daily. 08/23/18   Wurst, [place], PA-C  glipiZIDE (GLUCOTROL) 10 MG tablet Take 1 tablet (10 mg total) by mouth 2 (two) times daily before a meal. 06/25/18   Ladell Pier, MD  glucose blood (TRUE METRIX BLOOD GLUCOSE TEST) test strip Use as instructed to check blood sugar 3 times daily. E11.9 07/31/17   Ladell Pier, MD  hydrochlorothiazide (MICROZIDE) 12.5 MG capsule Take 1 capsule (12.5 mg total) by mouth daily. 06/25/18   Ladell Pier, MD  Lancet Devices [facility]) lancets Use as instructed for 3 times daily testing of blood glucose 08/09/16   Langeland, Dawn T, MD  latanoprost (XALATAN) 0.005 % ophthalmic solution Place 1 drop into the left eye at bedtime.    [provider]  levothyroxine (SYNTHROID, LEVOTHROID) 88 MCG tablet Take 1 tablet (88 mcg total) by mouth daily. 06/25/18   Ladell Pier, MD  LINZESS 72 MCG capsule TAKE ONE TABLET BY MOUTH DAILY BEFORE BREAKFAST. 01/16/19   Ladell Pier, MD  lisinopril (PRINIVIL,ZESTRIL) 20 MG tablet Take 1 tablet (20 mg total) by mouth daily. 01/03/18   Argentina Donovan, PA-C  pravastatin (PRAVACHOL) 20 MG tablet Take 1 tablet (20 mg total) by mouth daily. 06/25/18   Ladell Pier, MD  sitaGLIPtin (JANUVIA) 100 MG tablet Take 1 tablet (100 mg total) by mouth daily. 06/25/18   Ladell Pier, MD  TRUEPLUS LANCETS 28G MISC Use as directed 05/10/17   Ladell Pier, MD    Family History Family History  Problem Relation Age of Onset   Asthma Other    Cancer Other    Hypertension Other    Diabetes Other     Social History Social History   Tobacco Use   Smoking status: Former Smoker    Last attempt to quit: 09/18/2001    Years since quitting: 17.4   Smokeless tobacco: Never Used  Substance Use Topics   Alcohol use: Yes   Drug use: No     Allergies   Latex; Metformin and related; Penicillins; Warfarin sodium; and  Iodinated diagnostic agents   Review of Systems Review of Systems  Gastrointestinal: Positive for abdominal pain, diarrhea, nausea and vomiting.  All other systems reviewed and are negative.    Physical Exam Updated Vital Signs BP (!) 159/97 (BP Location: Right Arm)    Pulse 61    Temp 97.6 F (36.4 C) (Oral)    Resp 16    Ht '5\' 2"'$  (1.575 m)    Wt 111.6 kg    SpO2 99%    BMI 44.99 kg/m   Physical Exam Vitals signs and nursing note reviewed.  Constitutional:      General: She is not in acute distress.    Appearance: She is  well-developed.     Comments: Obese female who appears nontoxic  HENT:     Head: Normocephalic and atraumatic.  Eyes:     Conjunctiva/sclera: Conjunctivae normal.     Pupils: Pupils are equal, round, and reactive to light.  Neck:     Musculoskeletal: Normal range of motion and neck supple.  Cardiovascular:     Rate and Rhythm: Normal rate and regular rhythm.     Pulses: Normal pulses.  Pulmonary:     Effort: Pulmonary effort is normal. No respiratory distress.     Breath sounds: Normal breath sounds. No wheezing.  Abdominal:     General: There is no distension.     Palpations: Abdomen is soft.     Tenderness: There is abdominal tenderness in the right upper quadrant, epigastric area and left upper quadrant.     Comments: ttp of upper abd, worse in RUQ. No obvious murphys sign. No obvious rigidity, guarding, or distention. Negative rebound  Musculoskeletal: Normal range of motion.  Skin:    General: Skin is warm and dry.     Capillary Refill: Capillary refill takes less than 2 seconds.  Neurological:     Mental Status: She is alert and oriented to person, place, and time.      ED Treatments / Results  Labs (all labs ordered are listed, but only abnormal results are displayed) Labs Reviewed  CBC WITH DIFFERENTIAL/PLATELET - Abnormal; Notable for the following components:      Result Value   Hemoglobin 15.3 (*)    Neutro Abs 8.2 (*)    All  other components within normal limits  COMPREHENSIVE METABOLIC PANEL - Abnormal; Notable for the following components:   Potassium 5.2 (*)    Glucose, Bld 187 (*)    Creatinine, Ser 1.24 (*)    Total Bilirubin 1.8 (*)    GFR calc non Af Amer 46 (*)    GFR calc Af Amer 53 (*)    All other components within normal limits  URINALYSIS, ROUTINE W REFLEX MICROSCOPIC - Abnormal; Notable for the following components:   APPearance HAZY (*)    Hgb urine dipstick SMALL (*)    Ketones, ur 5 (*)    All other components within normal limits  SARS CORONAVIRUS 2    EKG None  Radiology Ct Abdomen Pelvis Wo Contrast  Result Date: 02/24/2019 CLINICAL DATA:  Upper abdominal pain with nausea and vomiting for 1 day. EXAM: CT ABDOMEN AND PELVIS WITHOUT CONTRAST TECHNIQUE: Multidetector CT imaging of the abdomen and pelvis was performed following the standard protocol without IV contrast. COMPARISON:  CT chest, abdomen and pelvis 09/09/2017. FINDINGS: Lower chest: Lung bases clear.  No pleural or pericardial effusion. Hepatobiliary: Stones are identified in the fundus of the gallbladder. There is also a punctate stone in the gallbladder neck. No gallbladder wall thickening or surrounding fluid. The liver and biliary tree appear normal. Pancreas: Unremarkable. No pancreatic ductal dilatation or surrounding inflammatory changes. Spleen: Normal in size without focal abnormality. Adrenals/Urinary Tract: Adrenal glands are unremarkable. Kidneys are normal, without renal calculi, focal lesion, or hydronephrosis. Bladder is unremarkable. Stomach/Bowel: Stomach is within normal limits. Appendix appears normal. No evidence of bowel wall thickening, distention, or inflammatory changes. Vascular/Lymphatic: No significant vascular findings are present. No enlarged abdominal or pelvic lymph nodes. Reproductive: Status post hysterectomy. No adnexal masses. Other: Small fat containing umbilical hernia is noted. Musculoskeletal: No  acute or focal abnormality. Advanced degenerative disease about the right hip is seen. Lower lumbar facet  arthropathy noted. IMPRESSION: No acute abnormality abdomen or pelvis. Negative for urinary tract stone. Gallstones without evidence of cholecystitis. Lower lumbar degenerative disease and right hip osteoarthritis. Electronically Signed   By: Inge Rise M.D.   On: 02/24/2019 11:19    Procedures Procedures (including critical care time)  Medications Ordered in ED Medications  cefTRIAXone (ROCEPHIN) 2 g in sodium chloride 0.9 % 100 mL IVPB (has no administration in time range)  ondansetron (ZOFRAN) injection 4 mg (4 mg Intravenous Given 02/24/19 1023)  morphine 4 MG/ML injection 4 mg (4 mg Intravenous Given 02/24/19 1025)     Initial Impression / Assessment and Plan / ED Course  I have reviewed the triage vital signs and the nursing notes.  Pertinent labs & imaging results that were available during my care of the patient were reviewed by me and considered in my medical decision making (see chart for details).        Patient presenting for evaluation nausea, vomiting, diarrhea, abdominal pain.  Physical exam shows patient appears nontoxic.  She is afebrile not tachycardic.  However, she is having upper abdominal tenderness, worse on the right side.  Additionally, she has a large midline scar, and reporting fullness and swelling of her abdomen.  As such, consider gallbladder etiology, bowel obstruction, or viral GI illness versus foodborne GI illness.  Also consider risk for DKA, as patient is diabetic and does not check her blood sugars. Will order labs, urine and ct.   Pt has allergy to iodinated contrast.  Will order CT abdomen pelvis without.  Labs reassuring, no leukocytosis, although white count is high normal at 10.4.  Potassium mildly elevated at 5.2, and creatinine is slightly up from baseline at 1.24.  Likely due to the diarrhea and vomiting.  Hemoglobin change she is slightly  hemoconcentrated.  Of note, patient's bilirubin is elevated at 1.8.  CT pending.  CT shows multiple gallstones with a gallstone in the gallbladder neck.  I am concerned that this could lead to cholecystitis in the near future.  As such, will consult with general surgery.  Discussed with J Foucht, PA-C from general surgery who recommends admission, will evaluated the patient in the ED.  Covid test ordered.  Patient to be admitted by general surgery with plan for cholecystectomy today.   Final Clinical Impressions(s) / ED Diagnoses   Final diagnoses:  Calculus of gallbladder and bile duct without cholecystitis or obstruction    ED Discharge Orders    None       Franchot Heidelberg, PA-C 02/24/19 1244    Sherwood Gambler, MD 02/27/19 [phone]

## 2019-02-24 NOTE — H&P (Signed)
[facility] Surgery Consult/Admission Note  Kayla Bean [birth year]  [id number].    Requesting MD: Franchot Heidelberg, PA-C  Chief Complaint/Reason for Consult: abdominal pain, cholelithiasis  HPI:   Pt is a 63 yo female with a hx of DM, HTN, severe obesity who presented to the ED with complaints of abdominal pain. Pt states 3 nights ago she had abdominal pain after eating chicken wings that persisted through the night but resolved the following day. This pain returned this morning in her RUQ region, non radiating, constant, severe with associated nausea and vomiting. She had a normal small BM this am. She has a hx of abdominal hysterectomy otherwise no other abdominal surgeries. I spoke with her Daughter, Lorenza Chick, on the phone. Pt does not use tobacco, minimal ETOH and she does not work. She has not eaten today. She is agreeable to surgery.  ROS:  Review of Systems  Constitutional: Negative for chills, diaphoresis and fever.  HENT: Negative for sore throat.   Respiratory: Negative for cough and shortness of breath.   Cardiovascular: Negative for chest pain.  Gastrointestinal: Positive for abdominal pain, nausea and vomiting. Negative for blood in stool, constipation and diarrhea.  Genitourinary: Negative for dysuria.  Skin: Negative for rash.  Neurological: Negative for dizziness and loss of consciousness.  All other systems reviewed and are negative.    Family History  Problem Relation Age of Onset  . Asthma Other   . Cancer Other   . Hypertension Other   . Diabetes Other     Past Medical History:  Diagnosis Date  . Arthritis   . Asthma   . Diabetes mellitus   . Heart murmur   . Hemorrhoids   . Hypertension   . Thyroid disease    hypothyroidism    Past Surgical History:  Procedure Laterality Date  . ABDOMINAL HYSTERECTOMY    . KNEE SURGERY     left  . THYROIDECTOMY    . TOTAL KNEE ARTHROPLASTY     right knee    Social History:  reports that she quit  smoking about 17 years ago. She has never used smokeless tobacco. She reports current alcohol use. She reports that she does not use drugs.  Allergies:  Allergies  Allergen Reactions  . Latex Hives  . Metformin And Related     Hair loss  . Penicillins Hives  . Warfarin Sodium Hives    REACTION: Got rash when took one pill of coumadin  related to knee replacement surgery in 2007.  . Iodinated Diagnostic Agents Other (See Comments)    PT STATES SHE HAD AN ALLERGIC REACTION OF BLISTERS ON HANDS AND FEET 2 DAYS AFTER CT SCAN W/ IV CONTRAST INJECTION, DR. MANSELL SUGGESTS 13 HR PREP//A.C.    (Not in a hospital admission)   Blood pressure (!) 159/97, pulse 61, temperature 97.6 F (36.4 C), temperature source Oral, resp. rate 16, height 5\' 2"  (1.575 m), weight 111.6 kg, SpO2 99 %.  Physical Exam Constitutional:      General: She is not in acute distress.    Appearance: Normal appearance. She is morbidly obese. She is not diaphoretic.  HENT:     Head: Normocephalic and atraumatic.     Nose: Nose normal.     Mouth/Throat:     Comments: Pt was wearing a mask Eyes:     General: No scleral icterus.       Right eye: No discharge.        Left eye: No discharge.  Conjunctiva/sclera: Conjunctivae normal.     Pupils: Pupils are equal, round, and reactive to light.  Neck:     Musculoskeletal: Normal range of motion and neck supple.  Cardiovascular:     Rate and Rhythm: Normal rate and regular rhythm.     Pulses:          Radial pulses are 2+ on the right side and 2+ on the left side.     Heart sounds: Normal heart sounds. No murmur.  Pulmonary:     Effort: Pulmonary effort is normal. No respiratory distress.     Breath sounds: Normal breath sounds. No wheezing, rhonchi or rales.  Abdominal:     General: Bowel sounds are normal. There is no distension.     Palpations: Abdomen is soft. Abdomen is not rigid.     Tenderness: There is abdominal tenderness in the right upper quadrant and  epigastric area. There is no guarding.  Musculoskeletal: Normal range of motion.        General: No tenderness or deformity.  Skin:    General: Skin is warm and dry.     Findings: No rash.  Neurological:     Mental Status: She is alert and oriented to person, place, and time.  Psychiatric:        Mood and Affect: Mood normal.        Behavior: Behavior normal. Behavior is cooperative.     Results for orders placed or performed during the hospital encounter of 02/24/19 (from the past 48 hour(s))  CBC with Differential     Status: Abnormal   Collection Time: 02/24/19  9:48 AM  Result Value Ref Range   WBC 10.4 4.0 - 10.5 K/uL   RBC 4.94 3.87 - 5.11 MIL/uL   Hemoglobin 15.3 (H) 12.0 - 15.0 g/dL   HCT 44.5 36.0 - 46.0 %   MCV 90.1 80.0 - 100.0 fL   MCH 31.0 26.0 - 34.0 pg   MCHC 34.4 30.0 - 36.0 g/dL   RDW 12.5 11.5 - 15.5 %   Platelets 252 150 - 400 K/uL   nRBC 0.0 0.0 - 0.2 %   Neutrophils Relative % 79 %   Neutro Abs 8.2 (H) 1.7 - 7.7 K/uL   Lymphocytes Relative 15 %   Lymphs Abs 1.6 0.7 - 4.0 K/uL   Monocytes Relative 3 %   Monocytes Absolute 0.3 0.1 - 1.0 K/uL   Eosinophils Relative 2 %   Eosinophils Absolute 0.2 0.0 - 0.5 K/uL   Basophils Relative 1 %   Basophils Absolute 0.1 0.0 - 0.1 K/uL   Immature Granulocytes 0 %   Abs Immature Granulocytes 0.02 0.00 - 0.07 K/uL    Comment: Performed at [facility], [address] [place], [place] [address]  Comprehensive metabolic panel     Status: Abnormal   Collection Time: 02/24/19  9:48 AM  Result Value Ref Range   Sodium 136 135 - 145 mmol/L   Potassium 5.2 (H) 3.5 - 5.1 mmol/L   Chloride 102 98 - 111 mmol/L   CO2 24 22 - 32 mmol/L   Glucose, Bld 187 (H) 70 - 99 mg/dL   BUN 14 8 - 23 mg/dL   Creatinine, Ser 1.24 (H) 0.44 - 1.00 mg/dL   Calcium 9.0 8.9 - 10.3 mg/dL   Total Protein 8.0 6.5 - 8.1 g/dL   Albumin 3.6 3.5 - 5.0 g/dL   AST 35 15 - 41 U/L   ALT 13 0 - 44  U/L   Alkaline Phosphatase 106 38 - 126 U/L    Total Bilirubin 1.8 (H) 0.3 - 1.2 mg/dL   GFR calc non Af Amer 46 (L) >60 mL/min   GFR calc Af Amer 53 (L) >60 mL/min   Anion gap 10 5 - 15    Comment: Performed at [facility] [address], [place], [place] [address]  Urinalysis, Routine w reflex microscopic     Status: Abnormal   Collection Time: 02/24/19  9:48 AM  Result Value Ref Range   Color, Urine YELLOW YELLOW   APPearance HAZY (A) CLEAR   Specific Gravity, Urine 1.024 1.005 - 1.030   pH 6.0 5.0 - 8.0   Glucose, UA NEGATIVE NEGATIVE mg/dL   Hgb urine dipstick SMALL (A) NEGATIVE   Bilirubin Urine NEGATIVE NEGATIVE   Ketones, ur 5 (A) NEGATIVE mg/dL   Protein, ur NEGATIVE NEGATIVE mg/dL   Nitrite NEGATIVE NEGATIVE   Leukocytes,Ua NEGATIVE NEGATIVE   RBC / HPF 0-5 0 - 5 RBC/hpf   WBC, UA 0-5 0 - 5 WBC/hpf   Bacteria, UA NONE SEEN NONE SEEN   Squamous Epithelial / LPF 0-5 0 - 5   Mucus PRESENT     Comment: Performed at [facility], [place] [address] [place], [place] [address]   Ct Abdomen Pelvis Wo Contrast  Result Date: 02/24/2019 CLINICAL DATA:  Upper abdominal pain with nausea and vomiting for 1 day. EXAM: CT ABDOMEN AND PELVIS WITHOUT CONTRAST TECHNIQUE: Multidetector CT imaging of the abdomen and pelvis was performed following the standard protocol without IV contrast. COMPARISON:  CT chest, abdomen and pelvis 09/09/2017. FINDINGS: Lower chest: Lung bases clear.  No pleural or pericardial effusion. Hepatobiliary: Stones are identified in the fundus of the gallbladder. There is also a punctate stone in the gallbladder neck. No gallbladder wall thickening or surrounding fluid. The liver and biliary tree appear normal. Pancreas: Unremarkable. No pancreatic ductal dilatation or surrounding inflammatory changes. Spleen: Normal in size without focal abnormality. Adrenals/Urinary Tract: Adrenal glands are unremarkable. Kidneys are normal, without renal calculi, focal lesion, or hydronephrosis. Bladder is unremarkable.  Stomach/Bowel: Stomach is within normal limits. Appendix appears normal. No evidence of bowel wall thickening, distention, or inflammatory changes. Vascular/Lymphatic: No significant vascular findings are present. No enlarged abdominal or pelvic lymph nodes. Reproductive: Status post hysterectomy. No adnexal masses. Other: Small fat containing umbilical hernia is noted. Musculoskeletal: No acute or focal abnormality. Advanced degenerative disease about the right hip is seen. Lower lumbar facet arthropathy noted. IMPRESSION: No acute abnormality abdomen or pelvis. Negative for urinary tract stone. Gallstones without evidence of cholecystitis. Lower lumbar degenerative disease and right hip osteoarthritis. Electronically Signed   By: Inge Rise M.D.   On: 02/24/2019 11:19      Assessment/Plan Active Problems:   * No active hospital problems. *  DM - SSI HTN - home meds Hx of thyroidectomy - home synthroid  Symptomatic cholelithiasis - CT showed stone in the neck of the gallbladder - will take pt to the OR today for lap chole Hyperkalemia - likely dehydrated, IVF and will recheck tomorrow  FEN: NPO, SSI, IVF VTE: SCD's, lovenox ID: Rocephin once Foley: none Follow up: TBD  Plan: admit to CCS service, OR today for lap chole  Kalman Drape, [facility] Surgery 02/24/2019, 12:32 PM Pager: [phone] Consults: [phone] Mon-Fri 7:00 am-4:30 pm Sat-Sun 7:00 am-11:30 am

## 2019-02-24 NOTE — Op Note (Signed)
Laparoscopic Cholecystectomy with IOC Procedure Note  Indications: This patient presents with symptomatic gallbladder disease and will undergo laparoscopic cholecystectomy.  Pre-operative Diagnosis: Calculus of gallbladder with acute cholecystitis, without mention of obstruction  Post-operative Diagnosis: Same  Surgeon: Maia Petties   Assistants: Jackson Latino - PA-C  Anesthesia: General endotracheal anesthesia  ASA Class: 2  Procedure Details  The patient was seen again in the Holding Room. The risks, benefits, complications, treatment options, and expected outcomes were discussed with the patient. The possibilities of reaction to medication, pulmonary aspiration, perforation of viscus, bleeding, recurrent infection, finding a normal gallbladder, the need for additional procedures, failure to diagnose a condition, the possible need to convert to an open procedure, and creating a complication requiring transfusion or operation were discussed with the patient. The likelihood of improving the patient's symptoms with return to their baseline status is good.  The patient and/or family concurred with the proposed plan, giving informed consent. The site of surgery properly noted. The patient was taken to Operating Room, identified as Kayla Bean and the procedure verified as Laparoscopic Cholecystectomy with Intraoperative Cholangiogram. A Time Out was held and the above information confirmed.  Prior to the induction of general anesthesia, antibiotic prophylaxis was administered. General endotracheal anesthesia was then administered and tolerated well. After the induction, the abdomen was prepped with Chloraprep and draped in the sterile fashion. The patient was positioned in the supine position.  Local anesthetic agent was injected into the skin above the umbilicus and an incision made. We dissected down to the abdominal fascia with blunt dissection.  The fascia was incised vertically and  we entered the peritoneal cavity bluntly.  A pursestring suture of 0-Vicryl was placed around the fascial opening.  The Hasson cannula was inserted and secured with the stay suture.  Pneumoperitoneum was then created with CO2 and tolerated well without any adverse changes in the patient's vital signs. We inserted the laparoscope and encountered omental adhesions from her previous hysterectomy.  A 57mm optiview port was placed in the epigastrium.  The upper abdomen is clear of adhesions.  We placed a 5 mm lateral RUQ port and then used this to take down the omental adhesions to the umbilicus.  The epigastric port was upsized to an 11 mm port, then another 5 mm port was placed in the RUQ.   All skin incisions were infiltrated with a local anesthetic agent before making the incision and placing the trocars.   We positioned the patient in reverse Trendelenburg, tilted slightly to the patient's left.  The gallbladder was identified, the fundus grasped and retracted cephalad.  The gallbladder required decompression to allow grasping. Adhesions were lysed bluntly and with the electrocautery where indicated, taking care not to injure any adjacent organs or viscus. The infundibulum was grasped and retracted laterally, exposing the peritoneum overlying the triangle of Calot. This was then divided and exposed in a blunt fashion. A critical view of the cystic duct and cystic artery was obtained.  The cystic duct was clearly identified and bluntly dissected circumferentially. The cystic duct was ligated with a clip distally.   An incision was made in the cystic duct and the [facility] cholangiogram catheter introduced. The catheter was secured using a clip. A cholangiogram was then obtained which showed good visualization of the distal and proximal biliary tree with no sign of filling defects or obstruction.  Contrast flowed easily into the duodenum. The catheter was then removed.   The cystic duct was then ligated with  clips and  divided. The cystic artery was identified, dissected free, ligated with clips and divided as well.   The gallbladder was dissected from the liver bed in retrograde fashion with the electrocautery. The gallbladder was removed and placed in an Endocatch sac. The liver bed was irrigated and inspected. Hemostasis was achieved with the electrocautery. Copious irrigation was utilized and was repeatedly aspirated until clear.  The gallbladder and Endocatch sac were then removed through the umbilical port site.  We had to enlarge the fascial opening to allow removal of the gallbladder.  The pursestring suture was used to close the umbilical fascia.    We again inspected the right upper quadrant for hemostasis.  Pneumoperitoneum was released as we removed the trocars.  4-0 Monocryl was used to close the skin.   Benzoin, steri-strips, and clean dressings were applied. The patient was then extubated and brought to the recovery room in stable condition. Instrument, sponge, and needle counts were correct at closure and at the conclusion of the case.   Findings: Cholecystitis with Cholelithiasis  Estimated Blood Loss: less than 50 mL         Drains: none         Specimens: Gallbladder           Complications: None; patient tolerated the procedure well.         Disposition: PACU - hemodynamically stable.         Condition: stable  Kayla Bean. Kayla Dover, MD, [facility] Surgery  General/ Trauma Surgery Beeper [phone]  02/24/2019 3:55 PM

## 2019-02-24 NOTE — ED Notes (Signed)
Valuables  Locked up in security, #[id number], Patient will keep her pocketbook no valuables in it, she wants to keep her cellphone. All clothing and pocketbook, upper dentures will go with patient.

## 2019-02-25 ENCOUNTER — Other Ambulatory Visit: Payer: Self-pay

## 2019-02-25 ENCOUNTER — Encounter (HOSPITAL_COMMUNITY): Payer: Self-pay | Admitting: Surgery

## 2019-02-25 LAB — COMPREHENSIVE METABOLIC PANEL
ALT: 41 U/L (ref 0–44)
AST: 53 U/L — ABNORMAL HIGH (ref 15–41)
Albumin: 3 g/dL — ABNORMAL LOW (ref 3.5–5.0)
Alkaline Phosphatase: 65 U/L (ref 38–126)
Anion gap: 9 (ref 5–15)
BUN: 12 mg/dL (ref 8–23)
CO2: 23 mmol/L (ref 22–32)
Calcium: 8.6 mg/dL — ABNORMAL LOW (ref 8.9–10.3)
Chloride: 103 mmol/L (ref 98–111)
Creatinine, Ser: 1.23 mg/dL — ABNORMAL HIGH (ref 0.44–1.00)
GFR calc Af Amer: 54 mL/min — ABNORMAL LOW (ref >60)
GFR calc non Af Amer: 46 mL/min — ABNORMAL LOW (ref >60)
Glucose, Bld: 244 mg/dL — ABNORMAL HIGH (ref 70–99)
Potassium: 4.1 mmol/L (ref 3.5–5.1)
Sodium: 135 mmol/L (ref 135–145)
Total Bilirubin: 0.6 mg/dL (ref 0.3–1.2)
Total Protein: 7.1 g/dL (ref 6.5–8.1)

## 2019-02-25 LAB — CBC
HCT: 40.8 % (ref 36.0–46.0)
Hemoglobin: 14.2 g/dL (ref 12.0–15.0)
MCH: 31.1 pg (ref 26.0–34.0)
MCHC: 34.8 g/dL (ref 30.0–36.0)
MCV: 89.5 fL (ref 80.0–100.0)
Platelets: 217 10*3/uL (ref 150–400)
RBC: 4.56 MIL/uL (ref 3.87–5.11)
RDW: 12.4 % (ref 11.5–15.5)
WBC: 14.2 10*3/uL — ABNORMAL HIGH (ref 4.0–10.5)
nRBC: 0 % (ref 0.0–0.2)

## 2019-02-25 LAB — HIV ANTIBODY (ROUTINE TESTING W REFLEX): HIV Screen 4th Generation wRfx: NONREACTIVE

## 2019-02-25 LAB — GLUCOSE, CAPILLARY: Glucose-Capillary: 185 mg/dL — ABNORMAL HIGH (ref 70–99)

## 2019-02-25 MED ORDER — PANTOPRAZOLE SODIUM 40 MG PO TBEC: 40.0000 mg | DELAYED_RELEASE_TABLET | Freq: Every day | ORAL | Status: DC

## 2019-02-25 MED ORDER — OXYCODONE HCL 5 MG PO TABS: 5.0000 mg | ORAL_TABLET | Freq: Four times a day (QID) | ORAL | 0 refills | Status: DC | PRN

## 2019-02-25 MED ORDER — POLYETHYLENE GLYCOL 3350 17 G PO PACK: 17.0000 g | PACK | Freq: Every day | ORAL | 0 refills | Status: DC | PRN

## 2019-02-25 NOTE — Plan of Care (Signed)
  Problem: Clinical Measurements: Goal: Postoperative complications will be avoided or minimized Outcome: Progressing   Problem: Education: Goal: Knowledge of General Education information will improve Description Including pain rating scale, medication(s)/side effects and non-pharmacologic comfort measures Outcome: Progressing   Problem: Activity: Goal: Risk for activity intolerance will decrease Outcome: Progressing   Problem: Nutrition: Goal: Adequate nutrition will be maintained Outcome: Progressing   Problem: Pain Managment: Goal: General experience of comfort will improve Outcome: Progressing

## 2019-02-25 NOTE — Progress Notes (Signed)
Pt discharged home in stable condition after going over discharge teaching with no concerns voiced 

## 2019-02-25 NOTE — Discharge Instructions (Signed)
[facility] ° °Please arrive at least 30 min before your appointment to complete your check in paperwork.  If you are unable to arrive 30 min prior to your appointment time we may have to cancel or reschedule you. °LAPAROSCOPIC SURGERY: POST OP INSTRUCTIONS °Always review your discharge instruction sheet given to you by the facility where your surgery was performed. °IF YOU HAVE DISABILITY OR FAMILY LEAVE FORMS, YOU MUST BRING THEM TO THE OFFICE FOR PROCESSING.   °DO NOT GIVE THEM TO YOUR DOCTOR. ° °PAIN CONTROL ° °1. First take acetaminophen (Tylenol) AND/or ibuprofen (Advil) to control your pain after surgery.  Follow directions on package.  Taking acetaminophen (Tylenol) and/or ibuprofen (Advil) regularly after surgery will help to control your pain and lower the amount of prescription pain medication you may need.  You should not take more than 4,000 mg (4 grams) of acetaminophen (Tylenol) in 24 hours.  You should not take ibuprofen (Advil), aleve, motrin, naprosyn or other NSAIDS if you have a history of stomach ulcers or chronic kidney disease.  °2. A prescription for pain medication may be given to you upon discharge.  Take your pain medication as prescribed, if you still have uncontrolled pain after taking acetaminophen (Tylenol) or ibuprofen (Advil). °3. Use ice packs to help control pain. °4. If you need a refill on your pain medication, please contact your pharmacy.  They will contact our office to request authorization. Prescriptions will not be filled after 5pm or on week-ends. ° °HOME MEDICATIONS °5. Take your usually prescribed medications unless otherwise directed. ° °DIET °6. You should follow a light diet the first few days after arrival home.  Be sure to include lots of fluids daily. Avoid fatty, fried foods.  ° °CONSTIPATION °7. It is common to experience some constipation after surgery and if you are taking pain medication.  Increasing fluid intake and taking a stool  softener (such as Colace) will usually help or prevent this problem from occurring.  A mild laxative (Milk of Magnesia or Miralax) should be taken according to package instructions if there are no bowel movements after 48 hours. ° °WOUND/INCISION CARE °8. Most patients will experience some swelling and bruising in the area of the incisions.  Ice packs will help.  Swelling and bruising can take several days to resolve.  °9. Unless discharge instructions indicate otherwise, follow guidelines below  °a. STERI-STRIPS - you may remove your outer bandages 48 hours after surgery, and you may shower at that time.  You have steri-strips (small skin tapes) in place directly over the incision.  These strips should be left on the skin for 7-10 days.   °b. DERMABOND/SKIN GLUE - you may shower in 24 hours.  The glue will flake off over the next 2-3 weeks. °10. Any sutures or staples will be removed at the office during your follow-up visit. ° °ACTIVITIES °11. You may resume regular (light) daily activities beginning the next day--such as daily self-care, walking, climbing stairs--gradually increasing activities as tolerated.  You may have sexual intercourse when it is comfortable.  Refrain from any heavy lifting or straining until approved by your doctor. °a. You may drive when you are no longer taking prescription pain medication, you can comfortably wear a seatbelt, and you can safely maneuver your car and apply brakes. ° °FOLLOW-UP °12. You should see your doctor in the office for a follow-up appointment approximately 2-3 weeks after your surgery.  You should have been given your post-op/follow-up appointment when   your surgery was scheduled.  If you did not receive a post-op/follow-up appointment, make sure that you call for this appointment within a day or two after you arrive home to insure a convenient appointment time.   WHEN TO CALL YOUR DOCTOR: 1. Fever over 101.0 2. Inability to urinate 3. Continued bleeding from  incision. 4. Increased pain, redness, or drainage from the incision. 5. Increasing abdominal pain  The clinic staff is available to answer your questions during regular business hours.  Please dont hesitate to call and ask to speak to one of the nurses for clinical concerns.  If you have a medical emergency, go to the nearest emergency room or call 911.  A surgeon from [facility] is always on call at the hospital. [address], [place], [place], [place]  [address] ? [address], [place], [place]   [address] [phone] ? [phone] ? FAX [phone]    Managing Your Pain After Surgery Without Opioids    Thank you for participating in our program to help patients manage their pain after surgery without opioids. This is part of our effort to provide you with the best care possible, without exposing you or your family to the risk that opioids pose.  What pain can I expect after surgery? You can expect to have some pain after surgery. This is normal. The pain is typically worse the day after surgery, and quickly begins to get better. Many studies have found that many patients are able to manage their pain after surgery with Over-the-Counter (OTC) medications such as Tylenol and Motrin. If you have a condition that does not allow you to take Tylenol or Motrin, notify your surgical team.  How will I manage my pain? The best strategy for controlling your pain after surgery is around the clock pain control with Tylenol (acetaminophen) and Motrin (ibuprofen or Advil). Alternating these medications with each other allows you to maximize your pain control. In addition to Tylenol and Motrin, you can use heating pads or ice packs on your incisions to help reduce your pain.  How will I alternate your regular strength over-the-counter pain medication? You will take a dose of pain medication every three hours. ; Start by taking 650 mg of Tylenol (2 pills of 325  mg) ; 3 hours later take 600 mg of Motrin (3 pills of 200 mg) ; 3 hours after taking the Motrin take 650 mg of Tylenol ; 3 hours after that take 600 mg of Motrin.   - 1 -  See example - if your first dose of Tylenol is at 12:00 PM   12:00 PM Tylenol 650 mg (2 pills of 325 mg)  3:00 PM Motrin 600 mg (3 pills of 200 mg)  6:00 PM Tylenol 650 mg (2 pills of 325 mg)  9:00 PM Motrin 600 mg (3 pills of 200 mg)  Continue alternating every 3 hours   We recommend that you follow this schedule around-the-clock for at least 3 days after surgery, or until you feel that it is no longer needed. Use the table on the last page of this handout to keep track of the medications you are taking. Important: Do not take more than 3000mg  of Tylenol or 3200mg  of Motrin in a 24-hour period. Do not take ibuprofen/Motrin if you have a history of bleeding stomach ulcers, severe kidney disease, &/or actively taking a blood thinner  What if I still have pain? If you have pain that is not controlled  with the over-the-counter pain medications (Tylenol and Motrin or Advil) you might have what we call breakthrough pain. You will receive a prescription for a small amount of an opioid pain medication such as Oxycodone, Tramadol, or Tylenol with Codeine. Use these opioid pills in the first 24 hours after surgery if you have breakthrough pain. Do not take more than 1 pill every 4-6 hours.  If you still have uncontrolled pain after using all opioid pills, don't hesitate to call our staff using the number provided. We will help make sure you are managing your pain in the best way possible, and if necessary, we can provide a prescription for additional pain medication.   Day 1    Time  Name of Medication Number of pills taken  Amount of Acetaminophen  Pain Level   Comments  AM PM       AM PM       AM PM       AM PM       AM PM       AM PM       AM PM       AM PM       Total Daily amount of Acetaminophen Do not  take more than  3,000 mg per day      Day 2    Time  Name of Medication Number of pills taken  Amount of Acetaminophen  Pain Level   Comments  AM PM       AM PM       AM PM       AM PM       AM PM       AM PM       AM PM       AM PM       Total Daily amount of Acetaminophen Do not take more than  3,000 mg per day      Day 3    Time  Name of Medication Number of pills taken  Amount of Acetaminophen  Pain Level   Comments  AM PM       AM PM       AM PM       AM PM          AM PM       AM PM       AM PM       AM PM       Total Daily amount of Acetaminophen Do not take more than  3,000 mg per day      Day 4    Time  Name of Medication Number of pills taken  Amount of Acetaminophen  Pain Level   Comments  AM PM       AM PM       AM PM       AM PM       AM PM       AM PM       AM PM       AM PM       Total Daily amount of Acetaminophen Do not take more than  3,000 mg per day      Day 5    Time  Name of Medication Number of pills taken  Amount of Acetaminophen  Pain Level   Comments  AM PM       AM PM       AM PM  AM PM       AM PM       AM PM       AM PM       AM PM       Total Daily amount of Acetaminophen Do not take more than  3,000 mg per day       Day 6    Time  Name of Medication Number of pills taken  Amount of Acetaminophen  Pain Level  Comments  AM PM       AM PM       AM PM       AM PM       AM PM       AM PM       AM PM       AM PM       Total Daily amount of Acetaminophen Do not take more than  3,000 mg per day      Day 7    Time  Name of Medication Number of pills taken  Amount of Acetaminophen  Pain Level   Comments  AM PM       AM PM       AM PM       AM PM       AM PM       AM PM       AM PM       AM PM       Total Daily amount of Acetaminophen Do not take more than  3,000 mg per day        For additional information about how and where to safely dispose of unused  opioid medications - [employer]  Disclaimer: This document contains information and/or instructional materials adapted from [place] for the typical patient with your condition. It does not replace medical advice from your health care provider because your experience may differ from that of the typical patient. Talk to your health care provider if you have any questions about this document, your condition or your treatment plan. Adapted from [place] If you have a gallbladder condition, you may have trouble digesting fats. Eating a low-fat diet can help reduce your symptoms, and may be helpful before and after having surgery to remove your gallbladder (cholecystectomy). Your health care provider may recommend that you work with a diet and nutrition specialist (dietitian) to help you reduce the amount of fat in your diet. What are tips for following this plan? General guidelines  Limit your fat intake to less than 30% of your total daily calories. If you eat around 1,800 calories each day, this is less than 60 grams (g) of fat per day.  Fat is an important part of a healthy diet. Eating a low-fat diet can make it hard to maintain a healthy body weight. Ask your dietitian how much fat, calories, and other nutrients you need each day.  Eat small, frequent meals throughout the day instead of three large meals.  Drink at least 8-10 cups of fluid a day. Drink enough fluid to keep your urine clear or pale yellow.  Limit alcohol intake to no more than 1 drink a day for nonpregnant women and 2 drinks a day for men. One drink equals 12 oz of beer, 5 oz of wine, or 1 oz of hard liquor. Reading food labels  Check Nutrition Facts on food labels for the amount of fat per serving.  Choose foods with less than 3 grams of fat per serving. Shopping  Choose nonfat and low-fat healthy foods. Look for the words nonfat, low fat, or fat  free.  Avoid buying processed or prepackaged foods. Cooking  Cook using low-fat methods, such as baking, broiling, grilling, or boiling.  Cook with small amounts of healthy fats, such as olive oil, grapeseed oil, canola oil, or sunflower oil. What foods are recommended?   All fresh, frozen, or canned fruits and vegetables.  Whole grains.  Low-fat or non-fat (skim) milk and yogurt.  Lean meat, skinless poultry, fish, eggs, and beans.  Low-fat protein supplement powders or drinks.  Spices and herbs. What foods are not recommended?  High-fat foods. These include baked goods, fast food, fatty cuts of meat, ice cream, french toast, sweet rolls, pizza, cheese bread, foods covered with butter, creamy sauces, or cheese.  Fried foods. These include french fries, tempura, battered fish, breaded chicken, fried breads, and sweets.  Foods with strong odors.  Foods that cause bloating and gas. Summary  A low-fat diet can be helpful if you have a gallbladder condition, or before and after gallbladder surgery.  Limit your fat intake to less than 30% of your total daily calories. This is about 60 g of fat if you eat 1,800 calories each day.  Eat small, frequent meals throughout the day instead of three large meals. This information is not intended to replace advice given to you by your health care provider. Make sure you discuss any questions you have with your health care provider. Document Released: 09/09/2013 Document Revised: 10/12/2016 Document Reviewed: 10/12/2016 Elsevier Interactive Patient Education  2019 [employer].

## 2019-02-25 NOTE — Discharge Summary (Signed)
Patient ID: Kayla Bean [id number] Jan 30, 1956 63 y.o.  Admit date: 02/24/2019 Discharge date: 02/25/2019  Admitting Diagnosis: Symptomatic cholelithiasis  Discharge Diagnosis Patient Active Problem List   Diagnosis Date Noted  . Class 3 severe obesity with serious comorbidity and body mass index (BMI) of 45.0 to 49.9 in adult ([place]) 10/01/2018  . Moderate persistent asthma without complication 29/90/2057  . Hyperlipidemia 03/22/2017  . Cholelithiasis 12/24/2012  . LIPOMA 07/26/2010  . SEBACEOUS CYST 11/19/2009  . CONSTIPATION, CHRONIC 01/18/2009  . EUSTACHIAN TUBE DYSFUNCTION, RIGHT 10/16/2008  . LOM 09/01/2008  . Essential hypertension 08/18/2008  . Osteoarthritis of left knee 04/23/2008  . PLANTAR FASCIITIS, RIGHT 04/23/2008  . Hypothyroidism 10/15/2007    Consultants None  H&P: Pt is a 63 yo female with a hx of DM, HTN, severe obesity who presented to the ED with complaints of abdominal pain. Pt states 3 nights ago she had abdominal pain after eating chicken wings that persisted through the night but resolved the following day. This pain returned this morning in her RUQ region, non radiating, constant, severe with associated nausea and vomiting. She had a normal small BM this am. She has a hx of abdominal hysterectomy otherwise no other abdominal surgeries. I spoke with her Daughter, Kayla Bean, on the phone. Pt does not use tobacco, minimal ETOH and she does not work. She has not eaten today. She is agreeable to surgery.   Procedures Laparoscopic Cholecystectomy with [facility]  Hospital Course:  The patient was admitted and underwent a laparoscopic cholecystectomy with IOC.  The patient tolerated the procedure well.  On POD 1, the patient was tolerating a carb modified diet, voiding well, mobilizing, and pain was controlled with oral pain medications.  The patient was stable for DC home at this time with appropriate follow up made.  Physical Exam: Gen: Awake and alert, NAD  Heart:RRR Lungs: CTA b/l, normal effort Abd: Soft, ND, appropriately tender around incision sites. Laparoscopic port incisions with Tegaderm overlying and in place. Some dried blood on RUQ gauze secured by tegederm. All others appear c/d/i. +BS  Allergies as of 02/25/2019      Reactions   Latex Hives   Metformin And Related    Hair loss   Penicillins Hives   Warfarin Sodium Hives   REACTION: Got rash when took one pill of coumadin  related to knee replacement surgery in 2007.   Iodinated Diagnostic Agents Other (See Comments)   PT STATES SHE HAD AN ALLERGIC REACTION OF BLISTERS ON HANDS AND FEET 2 DAYS AFTER CT SCAN W/ IV CONTRAST INJECTION, DR. MANSELL SUGGESTS 13 HR PREP//A.C.      Medication List    TAKE these medications   accu-chek softclix lancets Use as instructed for 3 times daily testing of blood glucose   Albuterol Sulfate 108 (90 Base) MCG/ACT Aepb Commonly known as:  ProAir RespiClick Inhale 2 puffs into the lungs every 6 (six) hours as needed (for breathing).   amLODipine 5 MG tablet Commonly known as:  NORVASC Take 1 tablet (5 mg total) by mouth daily.   aspirin 81 MG tablet Take 1 tablet (81 mg total) daily by mouth.   brimonidine-timolol 0.2-0.5 % ophthalmic solution Commonly known as:  COMBIGAN Place 1 drop into both eyes every 12 (twelve) hours.   cetirizine 10 MG chewable tablet Commonly known as:  ZYRTEC Chew 1 tablet (10 mg total) by mouth daily.   cycloSPORINE 0.05 % ophthalmic emulsion Commonly known as:  RESTASIS Place 1 drop into  both eyes 2 (two) times daily.   fluticasone 50 MCG/ACT nasal spray Commonly known as:  FLONASE Place 2 sprays into both nostrils daily.   glipiZIDE 10 MG tablet Commonly known as:  GLUCOTROL Take 1 tablet (10 mg total) by mouth 2 (two) times daily before a meal.   glucose blood test strip Commonly known as:  True Metrix Blood Glucose Test Use as instructed to check blood sugar 3 times daily. E11.9    hydrochlorothiazide 12.5 MG capsule Commonly known as:  MICROZIDE Take 1 capsule (12.5 mg total) by mouth daily.   latanoprost 0.005 % ophthalmic solution Commonly known as:  XALATAN Place 1 drop into both eyes at bedtime.   levothyroxine 88 MCG tablet Commonly known as:  SYNTHROID Take 1 tablet (88 mcg total) by mouth daily.   Linzess 72 MCG capsule Generic drug:  linaclotide TAKE ONE TABLET BY MOUTH DAILY BEFORE BREAKFAST. What changed:  See the new instructions.   lisinopril 20 MG tablet Commonly known as:  ZESTRIL Take 1 tablet (20 mg total) by mouth daily.   oxyCODONE 5 MG immediate release tablet Commonly known as:  Oxy IR/ROXICODONE Take 1 tablet (5 mg total) by mouth every 6 (six) hours as needed.   polyethylene glycol 17 g packet Commonly known as:  MIRALAX / GLYCOLAX Take 17 g by mouth daily as needed for mild constipation.   pravastatin 20 MG tablet Commonly known as:  PRAVACHOL Take 1 tablet (20 mg total) by mouth daily.   sitaGLIPtin 100 MG tablet Commonly known as:  JANUVIA Take 1 tablet (100 mg total) by mouth daily.   True Metrix Meter w/Device Kit Use as directed   TRUEplus Lancets 28G Misc Use as directed        Follow-up Information    [facility] [place]. Call.   Specialty:  General Surgery Why:  Please call to confirm you appointment time. Please arrive 30 minutes early to your appointment. Please bring a copy of your photo ID and insurance card to your appointment.  Contact information: [place] [address] [phone]           Signed: Alferd Apa, [facility] 02/25/2019, 7:46 AM Pager: [phone]

## 2019-02-27 ENCOUNTER — Other Ambulatory Visit: Payer: Self-pay

## 2019-02-27 NOTE — Patient Outreach (Signed)
[facility]) Care Management  02/27/2019  Kayla Bean 01/19/1956 [id number]     Transition of Care Referral  Referral Date: 02/27/2019 Referral Source: [employer] Discharge Report Date of Admission: 02/24/2019 Diagnosis: "cholelithiasis" Date of Discharge: 02/25/2019 Facility: [facility]    Outreach attempt #1 to patient. Spoke wit patient who voices she is doing fairly well. She states that she still has some soreness/tenderness to surgical area but otherwise is doing fine. Incision area is healing and showing no s/s of infection. She has a dry bandage to affected area. She reports that appetite is fair but not her normal. RN CM encouraged patient to try nutritional supplements and she voiced understanding and will do so. She confirmed that she has all her meds in the home. Meds reviewed-no issues or concerns regarding them. She has not had a BM since returning home but reports she is "having a lot of gas." She has Miralax ordered prn and encouraged patient to take dosage today. She will do so and voiced she knows how to take med. She has f/u appt on 03/11/2019 with surgeon. She reports she has supportive daughter who is assisting her with her care needs. She denies any further RN CM or THN needs or concerns at this time. Patient aware that she can call RN CM/THN at any time for any future needs or concerns. She voiced understanding and was appreciative of call.    Plan: RN CM will close case as no further interventions needed at this time.    Enzo Montgomery, RN,BSN,CCM [facility] Telephonic Care Management Coordinator Direct Phone: [phone] Toll Free: [phone] Fax: [phone]

## 2019-03-13 ENCOUNTER — Ambulatory Visit: Payer: Medicare HMO

## 2019-03-23 ENCOUNTER — Other Ambulatory Visit: Payer: Self-pay | Admitting: Internal Medicine

## 2019-03-23 DIAGNOSIS — E785 Hyperlipidemia, unspecified: Secondary | ICD-10-CM

## 2019-03-23 DIAGNOSIS — I10 Essential (primary) hypertension: Secondary | ICD-10-CM

## 2019-03-25 ENCOUNTER — Other Ambulatory Visit: Payer: Self-pay | Admitting: Internal Medicine

## 2019-03-25 DIAGNOSIS — E785 Hyperlipidemia, unspecified: Secondary | ICD-10-CM

## 2019-03-25 DIAGNOSIS — E890 Postprocedural hypothyroidism: Secondary | ICD-10-CM

## 2019-03-25 DIAGNOSIS — I10 Essential (primary) hypertension: Secondary | ICD-10-CM

## 2019-03-25 DIAGNOSIS — E119 Type 2 diabetes mellitus without complications: Secondary | ICD-10-CM

## 2019-03-25 NOTE — Telephone Encounter (Signed)
Patient called to request a refill on their medication, they were instructed to hang up and contact their pharmacy directly to order their prescriptions even if they are out of refills.   - Glipizide  10 MG  - levothyroxine 60mcg  - Januvia 100 mg  - Pravastatin 20 mg  - Hydrochlorothiazide 12.5 mg  - Linziness 72 mg    Patient use [facility]   Thank you   Patient has an appointment with Dr Wynetta Emery on 8/6

## 2019-03-27 MED ORDER — LINACLOTIDE 72 MCG PO CAPS: ORAL_CAPSULE | ORAL | 0 refills | Status: DC

## 2019-03-27 MED ORDER — LEVOTHYROXINE SODIUM 88 MCG PO TABS: 88.0000 ug | ORAL_TABLET | Freq: Every day | ORAL | 0 refills | Status: DC

## 2019-03-27 MED ORDER — SITAGLIPTIN PHOSPHATE 100 MG PO TABS: 100.0000 mg | ORAL_TABLET | Freq: Every day | ORAL | 0 refills | Status: DC

## 2019-03-27 MED ORDER — GLIPIZIDE 10 MG PO TABS: 10.0000 mg | ORAL_TABLET | Freq: Two times a day (BID) | ORAL | 0 refills | Status: DC

## 2019-03-27 MED ORDER — PRAVASTATIN SODIUM 20 MG PO TABS: 20.0000 mg | ORAL_TABLET | Freq: Every day | ORAL | 0 refills | Status: DC

## 2019-03-27 MED ORDER — HYDROCHLOROTHIAZIDE 12.5 MG PO CAPS: 12.5000 mg | ORAL_CAPSULE | Freq: Every day | ORAL | 0 refills | Status: DC

## 2019-04-17 ENCOUNTER — Telehealth: Payer: Self-pay | Admitting: Internal Medicine

## 2019-04-17 DIAGNOSIS — E785 Hyperlipidemia, unspecified: Secondary | ICD-10-CM

## 2019-04-17 MED ORDER — PRAVASTATIN SODIUM 20 MG PO TABS: 20.0000 mg | ORAL_TABLET | Freq: Every day | ORAL | 0 refills | Status: DC

## 2019-04-17 NOTE — Telephone Encounter (Signed)
1) Medication(s) Requested (by name): pravastatin (PRAVACHOL) 20 MG tablet [[id number]]    2) Pharmacy of Choice: [place] ([place]), [place] - [address]  3) Special Requests:   Approved medications will be sent to the pharmacy, we will reach out if there is an issue.  Requests made after 3pm may not be addressed until the following business day!  If a patient is unsure of the name of the medication(s) please note and ask patient to call back when they are able to provide all info, do not send to responsible party until all information is available!

## 2019-04-23 DIAGNOSIS — M1712 Unilateral primary osteoarthritis, left knee: Secondary | ICD-10-CM | POA: Diagnosis not present

## 2019-04-24 ENCOUNTER — Encounter: Payer: Self-pay | Admitting: Internal Medicine

## 2019-04-24 ENCOUNTER — Ambulatory Visit: Payer: Medicare HMO | Attending: Internal Medicine | Admitting: Internal Medicine

## 2019-04-24 ENCOUNTER — Other Ambulatory Visit: Payer: Self-pay

## 2019-04-24 VITALS — BP 145/69 | HR 56 | Temp 98.6°F | Resp 16 | Wt 247.8 lb

## 2019-04-24 DIAGNOSIS — E66813 Obesity, class 3: Secondary | ICD-10-CM

## 2019-04-24 DIAGNOSIS — E119 Type 2 diabetes mellitus without complications: Secondary | ICD-10-CM

## 2019-04-24 DIAGNOSIS — E1165 Type 2 diabetes mellitus with hyperglycemia: Secondary | ICD-10-CM

## 2019-04-24 DIAGNOSIS — Z23 Encounter for immunization: Secondary | ICD-10-CM

## 2019-04-24 DIAGNOSIS — E1169 Type 2 diabetes mellitus with other specified complication: Secondary | ICD-10-CM | POA: Diagnosis not present

## 2019-04-24 DIAGNOSIS — I10 Essential (primary) hypertension: Secondary | ICD-10-CM

## 2019-04-24 DIAGNOSIS — Z6841 Body Mass Index (BMI) 40.0 and over, adult: Secondary | ICD-10-CM | POA: Diagnosis not present

## 2019-04-24 DIAGNOSIS — E785 Hyperlipidemia, unspecified: Secondary | ICD-10-CM

## 2019-04-24 LAB — GLUCOSE, POCT (MANUAL RESULT ENTRY): POC Glucose: 249 mg/dl — AB (ref 70–99)

## 2019-04-24 LAB — POCT GLYCOSYLATED HEMOGLOBIN (HGB A1C): HbA1c, POC (controlled diabetic range): 6.7 % (ref 0.0–7.0)

## 2019-04-24 NOTE — Patient Instructions (Signed)
Td Vaccine (Tetanus and Diphtheria): What You Need to Know 1. Why get vaccinated? Tetanus  and diphtheria are very serious diseases. They are rare in the [place] today, but people who do become infected often have severe complications. Td vaccine is used to protect adolescents and adults from both of these diseases. Both tetanus and diphtheria are infections caused by bacteria. Diphtheria spreads from person to person through coughing or sneezing. Tetanus-causing bacteria enter the body through cuts, scratches, or wounds. TETANUS (Lockjaw) causes painful muscle tightening and stiffness, usually all over the body.  It can lead to tightening of muscles in the head and neck so you can't open your mouth, swallow, or sometimes even breathe. Tetanus kills about 1 out of every 10 people who are infected even after receiving the best medical care. DIPHTHERIA can cause a thick coating to form in the back of the throat.  It can lead to breathing problems, paralysis, heart failure, and death. Before vaccines, as many as 200,000 cases of diphtheria and hundreds of cases of tetanus were reported in the [place] each year. Since vaccination began, reports of cases for both diseases have dropped by about 99%. 2. Td vaccine Td vaccine can protect adolescents and adults from tetanus and diphtheria. Td is usually given as a booster dose every 10 years but it can also be given earlier after a severe and dirty wound or burn. Another vaccine, called Tdap, which protects against pertussis in addition to tetanus and diphtheria, is sometimes recommended instead of Td vaccine. Your doctor or the person giving you the vaccine can give you more information. Td may safely be given at the same time as other vaccines. 3. Some people should not get this vaccine  A person who has ever had a life-threatening allergic reaction after a previous dose of any tetanus or diphtheria containing vaccine, OR has a severe allergy  to any part of this vaccine, should not get Td vaccine. Tell the person giving the vaccine about any severe allergies.  Talk to your doctor if you: ? had severe pain or swelling after any vaccine containing diphtheria or tetanus, ? ever had a condition called Guillain Barr Syndrome (GBS), ? aren't feeling well on the day the shot is scheduled. 4. Risks of a vaccine reaction With any medicine, including vaccines, there is a chance of side effects. These are usually mild and go away on their own. Serious reactions are also possible but are rare. Most people who get Td vaccine do not have any problems with it. Mild Problems following Td vaccine: (Did not interfere with activities)  Pain where the shot was given (about 8 people in 10)  Redness or swelling where the shot was given (about 1 person in 4)  Mild fever (rare)  Headache (about 1 person in 4)  Tiredness (about 1 person in 4) Moderate Problems following Td vaccine: (Interfered with activities, but did not require medical attention)  Fever over 102F (rare) Severe Problems following Td vaccine: (Unable to perform usual activities; required medical attention)  Swelling, severe pain, bleeding and/or redness in the arm where the shot was given (rare). Problems that could happen after any vaccine:  People sometimes faint after a medical procedure, including vaccination. Sitting or lying down for about 15 minutes can help prevent fainting, and injuries caused by a fall. Tell your doctor if you feel dizzy, or have vision changes or ringing in the ears.  Some people get severe pain in the shoulder and have   difficulty moving the arm where a shot was given. This happens very rarely.  Any medication can cause a severe allergic reaction. Such reactions from a vaccine are very rare, estimated at fewer than 1 in a million doses, and would happen within a few minutes to a few hours after the vaccination. As with any medicine, there is a  very remote chance of a vaccine causing a serious injury or death. The safety of vaccines is always being monitored. For more information, visit: www.cdc.gov/vaccinesafety/ 5. What if there is a serious reaction? What should I look for?  Look for anything that concerns you, such as signs of a severe allergic reaction, very high fever, or unusual behavior. Signs of a severe allergic reaction can include hives, swelling of the face and throat, difficulty breathing, a fast heartbeat, dizziness, and weakness. These would usually start a few minutes to a few hours after the vaccination. What should I do?  If you think it is a severe allergic reaction or other emergency that can't wait, call 9-1-1 or get the person to the nearest hospital. Otherwise, call your doctor.  Afterward, the reaction should be reported to the Vaccine Adverse Event Reporting System (VAERS). Your doctor might file this report, or you can do it yourself through the VAERS web site at www.vaers.hhs.gov, or by calling 1-800-822-7967. VAERS does not give medical advice. 6. The National Vaccine Injury Compensation Program The National Vaccine Injury Compensation Program (VICP) is a federal program that was created to compensate people who may have been injured by certain vaccines. Persons who believe they may have been injured by a vaccine can learn about the program and about filing a claim by calling 1-800-338-2382 or visiting the VICP website at www.hrsa.gov/vaccinecompensation. There is a time limit to file a claim for compensation. 7. How can I learn more?  Ask your doctor. He or she can give you the vaccine package insert or suggest other sources of information.  Call your local or state health department.  Contact the Centers for Disease Control and Prevention (CDC): ? Call 1-800-232-4636 (1-800-CDC-INFO) ? Visit CDC's website at www.cdc.gov/vaccines Vaccine Information Statement Td Vaccine (12/28/15) This information is  not intended to replace advice given to you by your health care provider. Make sure you discuss any questions you have with your health care provider. Document Released: 07/02/2006 Document Revised: 04/22/2018 Document Reviewed: 04/22/2018 Elsevier Interactive Patient Education  2020 Elsevier Inc.  

## 2019-04-24 NOTE — Progress Notes (Signed)
Patient ID: AMYLAH WILL, female    DOB: 1956-05-26  MRN: [id number]  CC: Diabetes and Hypertension   Subjective: Kayla Bean is a 63 y.o. female who presents for chronic ds management Her concerns today include:  Patient with history of diabetes, hypertension,HL,hypothyroidism, asthma, CKD stage III, and obesity.  DM/obesity:  Not checking BS.  Needs new device Compliant with meds No sweets in 4 mths, every thing is being broiled or bake Down 9 lbs Not much activity since COVID.  She also had gallbladder surgery in June and states that she was just now able to be able to wear underwear without soreness from the surgery so she is getting back to her baseline activity.  Last eye exam was 4 mths Dr. Gretta Arab. Was suppose to go back in June.  Glaucoma suspect.  No diabetic retinopathy  HTN:  Takes Lisinopril and HCTZ.  She states that she had stopped Norvasc during the time when she was having abdominal pain because she thought the Norvasc was causing the problem.  It turns out that it was her gallbladder.  She has still not restarted the amlodipine. Took meds already for today Has device to check blood pressure but has not been doing so.   Denies any chest pain, shortness of breath, lower extremity edema  Since GB surgery, she is not having constipation any more.  She feels she no longer needs the Linzess  HL:   taking Pravachol as prescribed  Patient Active Problem List   Diagnosis Date Noted  . Class 3 severe obesity with serious comorbidity and body mass index (BMI) of 45.0 to 49.9 in adult ([place]) 10/01/2018  . Moderate persistent asthma without complication 41/96/2229  . Hyperlipidemia 03/22/2017  . Cholelithiasis 12/24/2012  . LIPOMA 07/26/2010  . SEBACEOUS CYST 11/19/2009  . CONSTIPATION, CHRONIC 01/18/2009  . EUSTACHIAN TUBE DYSFUNCTION, RIGHT 10/16/2008  . LOM 09/01/2008  . Essential hypertension 08/18/2008  . Osteoarthritis of left knee 04/23/2008   . PLANTAR FASCIITIS, RIGHT 04/23/2008  . Hypothyroidism 10/15/2007     Current Outpatient Medications on File Prior to Visit  Medication Sig Dispense Refill  . Albuterol Sulfate (PROAIR RESPICLICK) 798 (90 Base) MCG/ACT AEPB Inhale 2 puffs into the lungs every 6 (six) hours as needed (for breathing). 1 each 12  . amLODipine (NORVASC) 5 MG tablet Take 1 tablet (5 mg total) by mouth daily. (Patient not taking: Reported on 02/24/2019) 90 tablet 3  . aspirin 81 MG tablet Take 1 tablet (81 mg total) daily by mouth. 90 tablet 0  . Blood Glucose Monitoring Suppl (TRUE METRIX METER) w/Device KIT Use as directed 1 kit 0  . brimonidine-timolol (COMBIGAN) 0.2-0.5 % ophthalmic solution Place 1 drop into both eyes every 12 (twelve) hours.    . cetirizine (ZYRTEC) 10 MG chewable tablet Chew 1 tablet (10 mg total) by mouth daily. (Patient not taking: Reported on 02/24/2019) 20 tablet 0  . cycloSPORINE (RESTASIS) 0.05 % ophthalmic emulsion Place 1 drop into both eyes 2 (two) times daily.    . fluticasone (FLONASE) 50 MCG/ACT nasal spray Place 2 sprays into both nostrils daily. (Patient not taking: Reported on 02/24/2019) 16 g 0  . glipiZIDE (GLUCOTROL) 10 MG tablet Take 1 tablet (10 mg total) by mouth 2 (two) times daily before a meal. 60 tablet 0  . glucose blood (TRUE METRIX BLOOD GLUCOSE TEST) test strip Use as instructed to check blood sugar 3 times daily. E11.9 100 each 12  . hydrochlorothiazide (MICROZIDE) 12.5  MG capsule Take 1 capsule (12.5 mg total) by mouth daily. 30 capsule 0  . Lancet Devices (ACCU-CHEK SOFTCLIX) lancets Use as instructed for 3 times daily testing of blood glucose 1 each 0  . latanoprost (XALATAN) 0.005 % ophthalmic solution Place 1 drop into both eyes at bedtime.     Marland Kitchen levothyroxine (SYNTHROID) 88 MCG tablet Take 1 tablet (88 mcg total) by mouth daily. 30 tablet 0  . linaclotide (LINZESS) 72 MCG capsule TAKE ONE TABLET BY MOUTH DAILY BEFORE BREAKFAST. 30 capsule 0  . lisinopril  (PRINIVIL,ZESTRIL) 20 MG tablet Take 1 tablet (20 mg total) by mouth daily. (Patient not taking: Reported on 02/24/2019) 90 tablet 3  . oxyCODONE (OXY IR/ROXICODONE) 5 MG immediate release tablet Take 1 tablet (5 mg total) by mouth every 6 (six) hours as needed. 15 tablet 0  . polyethylene glycol (MIRALAX / GLYCOLAX) 17 g packet Take 17 g by mouth daily as needed for mild constipation. 14 each 0  . pravastatin (PRAVACHOL) 20 MG tablet Take 1 tablet (20 mg total) by mouth daily. 30 tablet 0  . sitaGLIPtin (JANUVIA) 100 MG tablet Take 1 tablet (100 mg total) by mouth daily. 30 tablet 0  . TRUEPLUS LANCETS 28G MISC Use as directed 100 each 1   No current facility-administered medications on file prior to visit.     Allergies  Allergen Reactions  . Latex Hives  . Metformin And Related     Hair loss  . Penicillins Hives  . Warfarin Sodium Hives    REACTION: Got rash when took one pill of coumadin  related to knee replacement surgery in 2007.  . Iodinated Diagnostic Agents Other (See Comments)    PT STATES SHE HAD AN ALLERGIC REACTION OF BLISTERS ON HANDS AND FEET 2 DAYS AFTER CT SCAN W/ IV CONTRAST INJECTION, DR. MANSELL SUGGESTS 13 HR PREP//A.C.    Social History   Socioeconomic History  . Marital status: Single    Spouse name: Not on file  . Number of children: Not on file  . Years of education: Not on file  . Highest education level: Not on file  Occupational History  . Not on file  Social Needs  . Financial resource strain: Not on file  . Food insecurity    Worry: Not on file    Inability: Not on file  . Transportation needs    Medical: Not on file    Non-medical: Not on file  Tobacco Use  . Smoking status: Former Smoker    Quit date: 09/18/2001    Years since quitting: 17.6  . Smokeless tobacco: Never Used  Substance and Sexual Activity  . Alcohol use: Yes  . Drug use: No  . Sexual activity: Not on file  Lifestyle  . Physical activity    Days per week: Not on file     Minutes per session: Not on file  . Stress: Not on file  Relationships  . Social Herbalist on phone: Not on file    Gets together: Not on file    Attends religious service: Not on file    Active member of club or organization: Not on file    Attends meetings of clubs or organizations: Not on file    Relationship status: Not on file  . Intimate partner violence    Fear of current or ex partner: Not on file    Emotionally abused: Not on file    Physically abused: Not on file  Forced sexual activity: Not on file  Other Topics Concern  . Not on file  Social History Narrative  . Not on file    Family History  Problem Relation Age of Onset  . Asthma Other   . Cancer Other   . Hypertension Other   . Diabetes Other     Past Surgical History:  Procedure Laterality Date  . ABDOMINAL HYSTERECTOMY    . CHOLECYSTECTOMY N/A 02/24/2019   Procedure: LAPAROSCOPIC CHOLECYSTECTOMY WITH INTRAOPERATIVE CHOLANGIOGRAM;  Surgeon: Donnie Mesa, MD;  Location: [facility];  Service: General;  Laterality: N/A;  . KNEE SURGERY     left  . THYROIDECTOMY    . TOTAL KNEE ARTHROPLASTY     right knee    ROS: Review of Systems Negative except as stated above  PHYSICAL EXAM: BP (!) 166/71   Pulse (!) 56   Temp 98.6 F (37 C) (Oral)   Resp 16   Wt 247 lb 12.8 oz (112.4 kg)   SpO2 99%   BMI 45.32 kg/m   Wt Readings from Last 3 Encounters:  04/24/19 247 lb 12.8 oz (112.4 kg)  02/24/19 246 lb (111.6 kg)  10/01/18 256 lb (116.1 kg)    Physical Exam  General appearance - alert, well appearing, and in no distress Nose - normal and patent, no erythema, discharge or polyps Mouth - mucous membranes moist, pharynx normal without lesions Neck - supple, no significant adenopathy Chest - clear to auscultation, no wheezes, rales or rhonchi, symmetric air entry Heart - normal rate, regular rhythm, normal S1, S2, no murmurs, rubs, clicks or gallops Extremities - peripheral pulses normal, no  pedal edema, no clubbing or cyanosis Diabetic Foot Exam - Simple   Simple Foot Form Visual Inspection No deformities, no ulcerations, no other skin breakdown bilaterally: Yes Sensation Testing Intact to touch and monofilament testing bilaterally: Yes Pulse Check Posterior Tibialis and Dorsalis pulse intact bilaterally: Yes Comments      Results for orders placed or performed in visit on 04/24/19  POCT glucose (manual entry)  Result Value Ref Range   POC Glucose 249 (A) 70 - 99 mg/dl   A1C 6.7  CMP Latest Ref Rng & Units 02/25/2019 02/24/2019 03/08/2018  Glucose 70 - 99 mg/dL 244(H) 187(H) 107(H)  BUN 8 - 23 mg/dL '12 14 13  '$ Creatinine 0.44 - 1.00 mg/dL 1.23(H) 1.24(H) 1.25(H)  Sodium 135 - 145 mmol/L 135 136 141  Potassium 3.5 - 5.1 mmol/L 4.1 5.2(H) 4.3  Chloride 98 - 111 mmol/L 103 102 104  CO2 22 - 32 mmol/L '23 24 23  '$ Calcium 8.9 - 10.3 mg/dL 8.6(L) 9.0 9.6  Total Protein 6.5 - 8.1 g/dL 7.1 8.0 8.2  Total Bilirubin 0.3 - 1.2 mg/dL 0.6 1.8(H) 0.5  Alkaline Phos 38 - 126 U/L 65 106 81  AST 15 - 41 U/L 53(H) 35 20  ALT 0 - 44 U/L 41 13 16   Lipid Panel     Component Value Date/Time   CHOL 139 03/08/2018 1649   TRIG 146 03/08/2018 1649   HDL 33 (L) 03/08/2018 1649   CHOLHDL 4.2 03/08/2018 1649   CHOLHDL 3.6 03/14/2016 1238   VLDL 12 03/14/2016 1238   LDLCALC 77 03/08/2018 1649    CBC    Component Value Date/Time   WBC 14.2 (H) 02/25/2019 0226   RBC 4.56 02/25/2019 0226   HGB 14.2 02/25/2019 0226   HGB 15.8 03/08/2018 1649   HCT 40.8 02/25/2019 0226   HCT 44.5 03/08/2018 1649  PLT 217 02/25/2019 0226   PLT 280 03/08/2018 1649   MCV 89.5 02/25/2019 0226   MCV 87 03/08/2018 1649   MCH 31.1 02/25/2019 0226   MCHC 34.8 02/25/2019 0226   RDW 12.4 02/25/2019 0226   RDW 14.2 03/08/2018 1649   LYMPHSABS 1.6 02/24/2019 0948   MONOABS 0.3 02/24/2019 0948   EOSABS 0.2 02/24/2019 0948   BASOSABS 0.1 02/24/2019 0948    ASSESSMENT AND PLAN: 1. Controlled type 2  diabetes mellitus without complication, without long-term current use of insulin ([place]) Encourage patient to check blood sugars at least once a day. Diabetic testing supplies sent to her pharmacy per her request.  Her A1c today is at goal which is less than 7.  Continue current medications.  Dietary counseling given.  Now that she is healed from her surgery I encouraged her to be more active. - POCT glucose (manual entry) - POCT glycosylated hemoglobin (Hb A1C) - Microalbumin / creatinine urine ratio - Lipid panel  2. Essential hypertension Not at goal.  Recommend that patient restart amlodipine.  Continue lisinopril and hydrochlorothiazide as well.  Try to limit salt in the foods.  3. Class 3 severe obesity with serious comorbidity and body mass index (BMI) of 45.0 to 49.9 in adult, unspecified obesity type ([place]) See #1 above.  4. Need for Tdap vaccination given  5. Hyperlipidemia, unspecified hyperlipidemia type Continue Pravachol     Patient was given the opportunity to ask questions.  Patient verbalized understanding of the plan and was able to repeat key elements of the plan.   Orders Placed This Encounter  Procedures  . Microalbumin / creatinine urine ratio  . POCT glucose (manual entry)  . POCT glycosylated hemoglobin (Hb A1C)     Requested Prescriptions    No prescriptions requested or ordered in this encounter    No follow-ups on file.  Karle Plumber, MD, FACP

## 2019-04-25 LAB — LIPID PANEL
Chol/HDL Ratio: 3.5 ratio (ref 0.0–4.4)
Cholesterol, Total: 154 mg/dL (ref 100–199)
HDL: 44 mg/dL (ref >39)
LDL Calculated: 97 mg/dL (ref 0–99)
Triglycerides: 63 mg/dL (ref 0–149)
VLDL Cholesterol Cal: 13 mg/dL (ref 5–40)

## 2019-04-25 LAB — MICROALBUMIN / CREATININE URINE RATIO
Creatinine, Urine: 129.3 mg/dL
Microalb/Creat Ratio: 4 mg/g creat (ref 0–29)
Microalbumin, Urine: 4.6 ug/mL

## 2019-04-25 MED ORDER — ACCU-CHEK GUIDE ME W/DEVICE KIT: 1.0000 | PACK | Freq: Every day | 0 refills | Status: DC

## 2019-04-25 MED ORDER — ACCU-CHEK FASTCLIX LANCETS MISC: 2 refills | Status: DC

## 2019-04-25 MED ORDER — ACCU-CHEK FASTCLIX LANCET KIT: PACK | 12 refills | Status: AC

## 2019-04-25 MED ORDER — LISINOPRIL 20 MG PO TABS: 20.0000 mg | ORAL_TABLET | Freq: Every day | ORAL | 3 refills | Status: DC

## 2019-04-25 MED ORDER — ACCU-CHEK GUIDE VI STRP: ORAL_STRIP | 12 refills | Status: DC

## 2019-04-25 MED ORDER — AMLODIPINE BESYLATE 5 MG PO TABS: 5.0000 mg | ORAL_TABLET | Freq: Every day | ORAL | 3 refills | Status: DC

## 2019-04-26 ENCOUNTER — Other Ambulatory Visit: Payer: Self-pay | Admitting: Internal Medicine

## 2019-04-26 DIAGNOSIS — E785 Hyperlipidemia, unspecified: Secondary | ICD-10-CM

## 2019-04-26 MED ORDER — PRAVASTATIN SODIUM 40 MG PO TABS: 40.0000 mg | ORAL_TABLET | Freq: Every day | ORAL | 3 refills | Status: DC

## 2019-04-28 ENCOUNTER — Telehealth: Payer: Self-pay

## 2019-04-28 NOTE — Telephone Encounter (Signed)
Contacted pt to go over lab results pt is aware and doesn't have any questions or concerns 

## 2019-05-23 ENCOUNTER — Other Ambulatory Visit: Payer: Self-pay | Admitting: Internal Medicine

## 2019-05-23 DIAGNOSIS — I10 Essential (primary) hypertension: Secondary | ICD-10-CM

## 2019-06-02 DIAGNOSIS — H401122 Primary open-angle glaucoma, left eye, moderate stage: Secondary | ICD-10-CM | POA: Diagnosis not present

## 2019-06-02 DIAGNOSIS — E119 Type 2 diabetes mellitus without complications: Secondary | ICD-10-CM | POA: Diagnosis not present

## 2019-06-02 DIAGNOSIS — H11153 Pinguecula, bilateral: Secondary | ICD-10-CM | POA: Diagnosis not present

## 2019-06-02 DIAGNOSIS — H11823 Conjunctivochalasis, bilateral: Secondary | ICD-10-CM | POA: Diagnosis not present

## 2019-06-02 DIAGNOSIS — H0288A Meibomian gland dysfunction right eye, upper and lower eyelids: Secondary | ICD-10-CM | POA: Diagnosis not present

## 2019-06-02 DIAGNOSIS — H16223 Keratoconjunctivitis sicca, not specified as Sjogren's, bilateral: Secondary | ICD-10-CM | POA: Diagnosis not present

## 2019-06-02 DIAGNOSIS — H0288B Meibomian gland dysfunction left eye, upper and lower eyelids: Secondary | ICD-10-CM | POA: Diagnosis not present

## 2019-06-02 DIAGNOSIS — Z79899 Other long term (current) drug therapy: Secondary | ICD-10-CM | POA: Diagnosis not present

## 2019-06-02 DIAGNOSIS — H401111 Primary open-angle glaucoma, right eye, mild stage: Secondary | ICD-10-CM | POA: Diagnosis not present

## 2019-06-12 ENCOUNTER — Encounter: Payer: Self-pay | Admitting: Internal Medicine

## 2019-06-12 ENCOUNTER — Ambulatory Visit (HOSPITAL_BASED_OUTPATIENT_CLINIC_OR_DEPARTMENT_OTHER): Payer: Medicare HMO | Admitting: Internal Medicine

## 2019-06-12 VITALS — BP 144/77

## 2019-06-12 DIAGNOSIS — M79672 Pain in left foot: Secondary | ICD-10-CM | POA: Diagnosis not present

## 2019-06-12 DIAGNOSIS — M25572 Pain in left ankle and joints of left foot: Secondary | ICD-10-CM

## 2019-06-12 DIAGNOSIS — R200 Anesthesia of skin: Secondary | ICD-10-CM

## 2019-06-12 DIAGNOSIS — E890 Postprocedural hypothyroidism: Secondary | ICD-10-CM | POA: Diagnosis not present

## 2019-06-12 NOTE — Progress Notes (Signed)
Virtual Visit via Telephone Note Due to current restrictions/limitations of in-office visits due to the COVID-19 pandemic, this scheduled clinical appointment was converted to a telehealth visit  I connected with Kayla Bean on 06/12/19 at 1:41 p.m by telephone and verified that I am speaking with the correct person using two identifiers. I am in my office.  The patient is at home.  Only the patient and myself participated in this encounter.  I discussed the limitations, risks, security and privacy concerns of performing an evaluation and management service by telephone and the availability of in person appointments. I also discussed with the patient that there may be a patient responsible charge related to this service. The patient expressed understanding and agreed to proceed.  History of Present Illness: Patient with history of diabetes, hypertension,HL,hypothyroidism, asthma, CKD stage III, and obesity.This is an UC/same day visit  Pt c/o swelling and pain of LT ankle and pain x 5 days.  Came on suddenly.  No redness. No injury and no insect bites.   Started as soreness in ankle and spread to foot. Foot and toes are swollen but less now compared to when it first started.  She has been elevating, icing and taking generic Tylenol No known injury.  C/o constant numbness in hands more so LT x 1 mth. Gets numbness when she holds objects for any length of time.  Also wakes her up from sleep.  No repetitive hand motion.     Current Outpatient Medications on File Prior to Visit  Medication Sig Dispense Refill  . Accu-Chek FastClix Lancets MISC Use as directed 100 each 2  . Albuterol Sulfate (PROAIR RESPICLICK) 354 (90 Base) MCG/ACT AEPB Inhale 2 puffs into the lungs every 6 (six) hours as needed (for breathing). 1 each 12  . amLODipine (NORVASC) 5 MG tablet Take 1 tablet (5 mg total) by mouth daily. 90 tablet 3  . aspirin 81 MG tablet Take 1 tablet (81 mg total) daily by mouth. 90 tablet  0  . Blood Glucose Monitoring Suppl (ACCU-CHEK GUIDE ME) w/Device KIT 1 Device by Does not apply route daily. 1 kit 0  . brimonidine-timolol (COMBIGAN) 0.2-0.5 % ophthalmic solution Place 1 drop into both eyes every 12 (twelve) hours.    . cycloSPORINE (RESTASIS) 0.05 % ophthalmic emulsion Place 1 drop into both eyes 2 (two) times daily.    . fluticasone (FLONASE) 50 MCG/ACT nasal spray Place 2 sprays into both nostrils daily. 16 g 0  . glipiZIDE (GLUCOTROL) 10 MG tablet Take 1 tablet (10 mg total) by mouth 2 (two) times daily before a meal. 60 tablet 0  . glucose blood (ACCU-CHEK GUIDE) test strip Use as instructed 100 each 12  . glucose blood (TRUE METRIX BLOOD GLUCOSE TEST) test strip Use as instructed to check blood sugar 3 times daily. E11.9 100 each 12  . hydrochlorothiazide (MICROZIDE) 12.5 MG capsule TAKE 1 CAPSULE BY MOUTH  DAILY 30 capsule 0  . Lancet Devices (ACCU-CHEK SOFTCLIX) lancets Use as instructed for 3 times daily testing of blood glucose 1 each 0  . Lancets Misc. (ACCU-CHEK FASTCLIX LANCET) KIT Use as directed 1 kit 12  . latanoprost (XALATAN) 0.005 % ophthalmic solution Place 1 drop into both eyes at bedtime.     Marland Kitchen levothyroxine (SYNTHROID) 88 MCG tablet Take 1 tablet (88 mcg total) by mouth daily. 30 tablet 0  . lisinopril (ZESTRIL) 20 MG tablet Take 1 tablet (20 mg total) by mouth daily. 90 tablet 3  . polyethylene  glycol (MIRALAX / GLYCOLAX) 17 g packet Take 17 g by mouth daily as needed for mild constipation. 14 each 0  . pravastatin (PRAVACHOL) 40 MG tablet Take 1 tablet (40 mg total) by mouth daily. 30 tablet 3  . sitaGLIPtin (JANUVIA) 100 MG tablet Take 1 tablet (100 mg total) by mouth daily. 30 tablet 0  . TRUEPLUS LANCETS 28G MISC Use as directed 100 each 1   No current facility-administered medications on file prior to visit.     Observations/Objective: No direct observation done as this was a telephone encounter  Assessment and Plan: 1. Acute pain of left  foot Sounds like it may be gout.  However I explained to patient the limitation of doing a telephone visit without actually seeing it in person.  Recommend trial of colchicine or low-dose of prednisone.  However patient declined.  She would like to have x-rays done. - DG Foot Complete Left; Future  2. Acute left ankle pain - DG Ankle Complete Left; Future  3. Numbness in both hands Likely carpal tunnel syndrome or diabetic neuropathy.  We will also check B12 level and her TSH.  Discussed carpal tunnel syndrome and recommend trial of cock-up wrist splints.  She will stop by tomorrow to pick up prescription for the wrist splints - Vitamin B12; Future - TSH; Future  4. Postoperative hypothyroidism - TSH; Future   Follow Up Instructions: PRN and as previously scheduled   I discussed the assessment and treatment plan with the patient. The patient was provided an opportunity to ask questions and all were answered. The patient agreed with the plan and demonstrated an understanding of the instructions.   The patient was advised to call back or seek an in-person evaluation if the symptoms worsen or if the condition fails to improve as anticipated.  I provided 16 minutes of non-face-to-face time during this encounter.   Karle Plumber, MD

## 2019-06-12 NOTE — Progress Notes (Signed)
Patient verified DOB Patient has taken medication. Patient has not eaten today. Patient complains of swollen left ankle and toes being present this morning. Patient has had swelling since Saturday. No relief with ice and elevation or tylenol OTC. The area is hot to the touch and burning. BP: 144/77 after taking medications.

## 2019-06-13 ENCOUNTER — Emergency Department (HOSPITAL_BASED_OUTPATIENT_CLINIC_OR_DEPARTMENT_OTHER): Payer: Medicare HMO

## 2019-06-13 ENCOUNTER — Emergency Department (HOSPITAL_COMMUNITY)
Admission: EM | Admit: 2019-06-13 | Discharge: 2019-06-13 | Disposition: A | Discharge status: Home / Self Care | Payer: Medicare HMO | Attending: Emergency Medicine | Admitting: Emergency Medicine

## 2019-06-13 ENCOUNTER — Encounter (HOSPITAL_COMMUNITY): Payer: Self-pay | Admitting: Emergency Medicine

## 2019-06-13 ENCOUNTER — Emergency Department (HOSPITAL_COMMUNITY): Payer: Medicare HMO

## 2019-06-13 DIAGNOSIS — Z7984 Long term (current) use of oral hypoglycemic drugs: Secondary | ICD-10-CM | POA: Insufficient documentation

## 2019-06-13 DIAGNOSIS — Z91040 Latex allergy status: Secondary | ICD-10-CM | POA: Insufficient documentation

## 2019-06-13 DIAGNOSIS — R2242 Localized swelling, mass and lump, left lower limb: Secondary | ICD-10-CM | POA: Diagnosis not present

## 2019-06-13 DIAGNOSIS — E119 Type 2 diabetes mellitus without complications: Secondary | ICD-10-CM | POA: Insufficient documentation

## 2019-06-13 DIAGNOSIS — Z7982 Long term (current) use of aspirin: Secondary | ICD-10-CM | POA: Insufficient documentation

## 2019-06-13 DIAGNOSIS — J45909 Unspecified asthma, uncomplicated: Secondary | ICD-10-CM | POA: Diagnosis not present

## 2019-06-13 DIAGNOSIS — M25572 Pain in left ankle and joints of left foot: Secondary | ICD-10-CM

## 2019-06-13 DIAGNOSIS — M7989 Other specified soft tissue disorders: Secondary | ICD-10-CM | POA: Diagnosis not present

## 2019-06-13 DIAGNOSIS — Z96651 Presence of right artificial knee joint: Secondary | ICD-10-CM | POA: Insufficient documentation

## 2019-06-13 DIAGNOSIS — M79609 Pain in unspecified limb: Secondary | ICD-10-CM

## 2019-06-13 DIAGNOSIS — Z87891 Personal history of nicotine dependence: Secondary | ICD-10-CM | POA: Insufficient documentation

## 2019-06-13 DIAGNOSIS — I10 Essential (primary) hypertension: Secondary | ICD-10-CM | POA: Diagnosis not present

## 2019-06-13 DIAGNOSIS — Z79899 Other long term (current) drug therapy: Secondary | ICD-10-CM | POA: Diagnosis not present

## 2019-06-13 MED ORDER — NAPROXEN 500 MG PO TABS: 500.0000 mg | ORAL_TABLET | Freq: Two times a day (BID) | ORAL | 0 refills | Status: DC | PRN

## 2019-06-13 NOTE — Progress Notes (Signed)
Left lower extremity venous duplex completed. Preliminary results in Chart review CV Proc. [facility], [place] 06/13/2019, 1:44 PM

## 2019-06-13 NOTE — Discharge Instructions (Signed)
It was my pleasure taking care of you today!   Take Naproxen twice daily with food as needed for pain/swelling.   Call your primary care doctor on Monday to schedule a follow-up appointment if your symptoms have not resolved.  Return to ER for new or worsening symptoms, any additional concerns.

## 2019-06-13 NOTE — ED Notes (Signed)
Patient denies pain and is resting comfortably.  

## 2019-06-13 NOTE — ED Provider Notes (Signed)
[facility] EMERGENCY DEPARTMENT Provider Note   CSN: [id number] Arrival date & time: 06/13/19  1108     History   Chief Complaint Chief Complaint  Patient presents with   Ankle Pain    HPI Kayla Bean is a 63 y.o. female.     The history is provided by medical records. No language interpreter was used.  Ankle Pain  Kayla Bean is a 63 y.o. female  with a PMH as listed below including HTN, HLD, DM who presents to the Emergency Department complaining of left lateral ankle pain which began 6 days ago.  No injury or inciting event.  She noticed pain and swelling to the side of her ankle while sitting watching TV with the ankle propped up.  Denies history of similar.  Initially was more swollen, but has come down with rest, ice, elevation and over-the-counter acetaminophen.  Swelling does seem improved as well.  Initially, she felt as if there was warmth to the side of her ankle, but this to has improved.  She saw her primary care doctor via virtual visit yesterday who felt as if this could be gout and recommended trial of colchicine and low-dose of prednisone, however patient declined at that time and wanting to have imaging done.  She is quite concerned that she could have a blood clot.  She denies any chest pain or shortness of breath.  Past Medical History:  Diagnosis Date   Arthritis    Asthma    Diabetes mellitus    Heart murmur    Hemorrhoids    Hypertension    Thyroid disease    hypothyroidism    Patient Active Problem List   Diagnosis Date Noted   Class 3 severe obesity with serious comorbidity and body mass index (BMI) of 45.0 to 49.9 in adult [facility]) 10/01/2018   Moderate persistent asthma without complication 91/69/4503   Hyperlipidemia 03/22/2017   Cholelithiasis 12/24/2012   LIPOMA 07/26/2010   SEBACEOUS CYST 11/19/2009   CONSTIPATION, CHRONIC 01/18/2009   EUSTACHIAN TUBE DYSFUNCTION, RIGHT 10/16/2008   LOM  09/01/2008   Essential hypertension 08/18/2008   Osteoarthritis of left knee 04/23/2008   PLANTAR FASCIITIS, RIGHT 04/23/2008   Hypothyroidism 10/15/2007    Past Surgical History:  Procedure Laterality Date   ABDOMINAL HYSTERECTOMY     CHOLECYSTECTOMY N/A 02/24/2019   Procedure: LAPAROSCOPIC CHOLECYSTECTOMY WITH INTRAOPERATIVE CHOLANGIOGRAM;  Surgeon: Donnie Mesa, MD;  Location: [place];  Service: General;  Laterality: N/A;   KNEE SURGERY     left   THYROIDECTOMY     TOTAL KNEE ARTHROPLASTY     right knee     OB History   No obstetric history on file.      Home Medications    Prior to Admission medications   Medication Sig Start Date End Date Taking? Authorizing Provider  Accu-Chek FastClix Lancets MISC Use as directed 04/25/19   Ladell Pier, MD  Albuterol Sulfate (PROAIR RESPICLICK) 888 (90 Base) MCG/ACT AEPB Inhale 2 puffs into the lungs every 6 (six) hours as needed (for breathing). 03/22/17   Ladell Pier, MD  amLODipine (NORVASC) 5 MG tablet Take 1 tablet (5 mg total) by mouth daily. 04/25/19   Ladell Pier, MD  aspirin 81 MG tablet Take 1 tablet (81 mg total) daily by mouth. 07/30/17   Ladell Pier, MD  Blood Glucose Monitoring Suppl (ACCU-CHEK GUIDE ME) w/Device KIT 1 Device by Does not apply route daily. 04/25/19   Wynetta Emery,  Dalbert Batman, MD  brimonidine-timolol (COMBIGAN) 0.2-0.5 % ophthalmic solution Place 1 drop into both eyes every 12 (twelve) hours.    [provider]  cycloSPORINE (RESTASIS) 0.05 % ophthalmic emulsion Place 1 drop into both eyes 2 (two) times daily.    [provider]  fluticasone (FLONASE) 50 MCG/ACT nasal spray Place 2 sprays into both nostrils daily. 08/23/18   Wurst, [place], PA-C  glipiZIDE (GLUCOTROL) 10 MG tablet Take 1 tablet (10 mg total) by mouth 2 (two) times daily before a meal. 03/27/19   Ladell Pier, MD  glucose blood (ACCU-CHEK GUIDE) test strip Use as instructed 04/25/19   Ladell Pier, MD  glucose blood (TRUE METRIX BLOOD GLUCOSE TEST) test strip Use as instructed to check blood sugar 3 times daily. E11.9 07/31/17   Ladell Pier, MD  hydrochlorothiazide (MICROZIDE) 12.5 MG capsule TAKE 1 CAPSULE BY MOUTH  DAILY 05/23/19   Ladell Pier, MD  Lancet Devices [facility]) lancets Use as instructed for 3 times daily testing of blood glucose 08/09/16   Langeland, Dawn T, MD  Lancets Misc. (ACCU-CHEK FASTCLIX LANCET) KIT Use as directed 04/25/19   Ladell Pier, MD  latanoprost (XALATAN) 0.005 % ophthalmic solution Place 1 drop into both eyes at bedtime.     [provider]  levothyroxine (SYNTHROID) 88 MCG tablet Take 1 tablet (88 mcg total) by mouth daily. 03/27/19   Ladell Pier, MD  lisinopril (ZESTRIL) 20 MG tablet Take 1 tablet (20 mg total) by mouth daily. 04/25/19   Ladell Pier, MD  naproxen (NAPROSYN) 500 MG tablet Take 1 tablet (500 mg total) by mouth 2 (two) times daily as needed. 06/13/19   Hailey Miles, Ozella Almond, PA-C  polyethylene glycol (MIRALAX / GLYCOLAX) 17 g packet Take 17 g by mouth daily as needed for mild constipation. 02/25/19   Maczis, Barth Kirks, PA-C  pravastatin (PRAVACHOL) 40 MG tablet Take 1 tablet (40 mg total) by mouth daily. 04/26/19   Ladell Pier, MD  sitaGLIPtin (JANUVIA) 100 MG tablet Take 1 tablet (100 mg total) by mouth daily. 03/27/19   Ladell Pier, MD  TRUEPLUS LANCETS 28G MISC Use as directed 05/10/17   Ladell Pier, MD    Family History Family History  Problem Relation Age of Onset   Asthma Other    Cancer Other    Hypertension Other    Diabetes Other     Social History Social History   Tobacco Use   Smoking status: Former Smoker    Quit date: 09/18/2001    Years since quitting: 17.7   Smokeless tobacco: Never Used  Substance Use Topics   Alcohol use: Yes   Drug use: No     Allergies   Latex, Metformin and related, Penicillins, Warfarin sodium, and Iodinated diagnostic  agents   Review of Systems Review of Systems  Musculoskeletal: Positive for arthralgias and joint swelling.  All other systems reviewed and are negative.    Physical Exam Updated Vital Signs BP 130/77    Pulse (!) 55    Temp 98.6 F (37 C) (Oral)    Resp 16    SpO2 100%   Physical Exam Vitals signs and nursing note reviewed.  Constitutional:      General: She is not in acute distress.    Appearance: She is well-developed.  HENT:     Head: Normocephalic and atraumatic.  Neck:     Musculoskeletal: Neck supple.  Cardiovascular:  Rate and Rhythm: Normal rate and regular rhythm.     Heart sounds: Normal heart sounds. No murmur.  Pulmonary:     Effort: Pulmonary effort is normal. No respiratory distress.     Breath sounds: Normal breath sounds. No wheezing or rales.  Musculoskeletal:     Comments: Mild swelling around the left lateral ankle.  No warmth or erythema.  Full range of motion without any difficulty.  No calf tenderness or pitting edema.  No unilateral leg swelling.  2+ DP.  Sensation intact.  Wiggles toes without difficulty.  No pain to the fifth metatarsal region.  Skin:    General: Skin is warm and dry.  Neurological:     Mental Status: She is alert.      ED Treatments / Results  Labs (all labs ordered are listed, but only abnormal results are displayed) Labs Reviewed - No data to display  EKG None  Radiology Dg Ankle Complete Left  Result Date: 06/13/2019 CLINICAL DATA:  Acute left ankle pain and swelling after feeling something pop on Saturday. EXAM: LEFT ANKLE COMPLETE - 3+ VIEW COMPARISON:  None. FINDINGS: No acute fracture or dislocation. Well corticated ossific density at the tip of the medial malleolus is likely due to old trauma. The ankle mortise is symmetric. The talar dome is intact. No tibiotalar joint effusion. Joint spaces are preserved. Bone mineralization is normal. Small plantar and Achilles enthesophytes. Mild soft tissue swelling over  the lateral malleolus. IMPRESSION: 1. Mild lateral hindfoot soft tissue swelling. No acute osseous abnormality. Electronically Signed   By: Titus Dubin M.D.   On: 06/13/2019 12:23   Vas [place] Lower Extremity Venous (dvt) (only [facility])  Result Date: 06/13/2019  Lower Venous Study Indications: Pain, and Swelling.  Limitations: Body habitus. Comparison Study: Previous study at another facilty 10/08/2015 negative Performing Technologist: Toma Copier RVS  Examination Guidelines: A complete evaluation includes B-mode imaging, spectral Doppler, color Doppler, and power Doppler as needed of all accessible portions of each vessel. Bilateral testing is considered an integral part of a complete examination. Limited examinations for reoccurring indications may be performed as noted.  +-----+---------------+---------+-----------+----------+--------------+  RIGHT Compressibility Phasicity Spontaneity Properties Thrombus Aging  +-----+---------------+---------+-----------+----------+--------------+  CFV   Full            Yes       Yes                                    +-----+---------------+---------+-----------+----------+--------------+  SFJ   Full                                                             +-----+---------------+---------+-----------+----------+--------------+   +---------+---------------+---------+-----------+----------+-------------------+  LEFT      Compressibility Phasicity Spontaneity Properties Thrombus Aging       +---------+---------------+---------+-----------+----------+-------------------+  CFV       Full            Yes       Yes                                         +---------+---------------+---------+-----------+----------+-------------------+  SFJ  Full                                                                  +---------+---------------+---------+-----------+----------+-------------------+  FV Prox   Full            Yes       Yes                                          +---------+---------------+---------+-----------+----------+-------------------+  FV Mid    Full                                                                  +---------+---------------+---------+-----------+----------+-------------------+  FV Distal Full            Yes       Yes                                         +---------+---------------+---------+-----------+----------+-------------------+  PFV       Full            Yes       Yes                                         +---------+---------------+---------+-----------+----------+-------------------+  POP       Full            Yes       Yes                                         +---------+---------------+---------+-----------+----------+-------------------+  PTV       Full                                                                  +---------+---------------+---------+-----------+----------+-------------------+  PERO      Full                                             Difficult to image  due to body habitus  +---------+---------------+---------+-----------+----------+-------------------+     Summary: Right: Thee is no evidence of a common femoral vein obstruction. Left: There is no evidence of deep vein thrombosis in the lower extremity. No cystic structure found in the popliteal fossa.  *See table(s) above for measurements and observations.    Preliminary     Procedures Procedures (including critical care time)  Medications Ordered in ED Medications - No data to display   Initial Impression / Assessment and Plan / ED Course  I have reviewed the triage vital signs and the nursing notes.  Pertinent labs & imaging results that were available during my care of the patient were reviewed by me and considered in my medical decision making (see chart for details).       ALEC MCPHEE is a 63 y.o. female who presents to ED for improving left ankle pain and swelling over  the last 6 days.  Neurovascularly intact on exam with full range of motion without any difficulty.  Initially describes warmth and difficulty with range of motion consistent with gout vs. Septic joint, however majority of her symptoms have improved.  Certainly could have been gout flare that is nearly resolved.  Doubt septic joint given her very reassuring exam today with no overlying erythema and full range of motion without any difficulty. X-ray done in triage shows mild lateral hindfoot soft tissue swelling without bony abnormalities.  She is quite concerned that her symptoms could be due to blood clot.  DVT ultrasound was ordered which was negative.  Patient discussed with Dr. Ronnald Nian who agrees with treatment plan.     Final Clinical Impressions(s) / ED Diagnoses   Final diagnoses:  Acute left ankle pain    ED Discharge Orders         Ordered    naproxen (NAPROSYN) 500 MG tablet  2 times daily PRN     06/13/19 1404           Jolena Kittle, Ozella Almond, PA-C 06/13/19 [place], [place], DO 06/13/19 1719

## 2019-06-13 NOTE — ED Triage Notes (Signed)
Pt reports left ankle pain, swelling since Saturday. Called PMD who sent over to rule out DVT.

## 2019-06-13 NOTE — ED Notes (Signed)
Patient verbalizes understanding of discharge instructions. Opportunity for questioning and answers were provided. Armband removed by staff, pt discharged from ED.  

## 2019-06-30 ENCOUNTER — Telehealth: Payer: Self-pay | Admitting: Internal Medicine

## 2019-06-30 DIAGNOSIS — I10 Essential (primary) hypertension: Secondary | ICD-10-CM

## 2019-06-30 MED ORDER — AMLODIPINE BESYLATE 10 MG PO TABS: 10.0000 mg | ORAL_TABLET | Freq: Every day | ORAL | 3 refills | Status: DC

## 2019-06-30 NOTE — Telephone Encounter (Signed)
Pt called to request a change in medication -hydrochlorothiazide (MICROZIDE) 12.5 MG capsule  States this is causing her gout per her ED visit, she would like this medication changed. Please follow up if possible   [place] ([place]), [place] - [address]

## 2019-07-01 NOTE — Telephone Encounter (Signed)
Contacted pt to go over Dr. Johnson response pt doesn't have any questions or concerns  

## 2019-07-17 ENCOUNTER — Ambulatory Visit
Admission: RE | Admit: 2019-07-17 | Discharge: 2019-07-17 | Disposition: A | Discharge status: Home / Self Care | Payer: Medicare HMO | Source: Ambulatory Visit | Attending: Internal Medicine | Admitting: Internal Medicine

## 2019-07-17 ENCOUNTER — Other Ambulatory Visit: Payer: Self-pay

## 2019-07-17 DIAGNOSIS — Z1231 Encounter for screening mammogram for malignant neoplasm of breast: Secondary | ICD-10-CM

## 2019-07-22 ENCOUNTER — Telehealth: Payer: Self-pay

## 2019-07-22 NOTE — Telephone Encounter (Signed)
Contacted pt to go over MM results pt didn't answer and was unable to lvm  

## 2019-07-28 ENCOUNTER — Other Ambulatory Visit: Payer: Self-pay | Admitting: Internal Medicine

## 2019-07-28 DIAGNOSIS — E119 Type 2 diabetes mellitus without complications: Secondary | ICD-10-CM

## 2019-07-29 ENCOUNTER — Ambulatory Visit: Payer: Medicare HMO | Admitting: Internal Medicine

## 2019-08-22 DIAGNOSIS — M25562 Pain in left knee: Secondary | ICD-10-CM | POA: Diagnosis not present

## 2019-08-23 ENCOUNTER — Other Ambulatory Visit: Payer: Self-pay | Admitting: Internal Medicine

## 2019-08-23 DIAGNOSIS — E785 Hyperlipidemia, unspecified: Secondary | ICD-10-CM

## 2019-08-26 ENCOUNTER — Other Ambulatory Visit: Payer: Self-pay | Admitting: Internal Medicine

## 2019-08-26 DIAGNOSIS — E119 Type 2 diabetes mellitus without complications: Secondary | ICD-10-CM

## 2019-09-25 ENCOUNTER — Other Ambulatory Visit: Payer: Self-pay | Admitting: Internal Medicine

## 2019-09-25 DIAGNOSIS — E119 Type 2 diabetes mellitus without complications: Secondary | ICD-10-CM

## 2019-10-02 ENCOUNTER — Other Ambulatory Visit: Payer: Self-pay | Admitting: Internal Medicine

## 2019-10-02 DIAGNOSIS — E119 Type 2 diabetes mellitus without complications: Secondary | ICD-10-CM

## 2019-10-22 ENCOUNTER — Emergency Department (HOSPITAL_COMMUNITY)
Admission: EM | Admit: 2019-10-22 | Discharge: 2019-10-22 | Disposition: A | Discharge status: Home / Self Care | Payer: Medicare HMO | Attending: Emergency Medicine | Admitting: Emergency Medicine

## 2019-10-22 ENCOUNTER — Other Ambulatory Visit: Payer: Self-pay

## 2019-10-22 ENCOUNTER — Emergency Department (HOSPITAL_COMMUNITY): Payer: Medicare HMO

## 2019-10-22 DIAGNOSIS — Z7982 Long term (current) use of aspirin: Secondary | ICD-10-CM | POA: Insufficient documentation

## 2019-10-22 DIAGNOSIS — Z7984 Long term (current) use of oral hypoglycemic drugs: Secondary | ICD-10-CM | POA: Diagnosis not present

## 2019-10-22 DIAGNOSIS — Z96651 Presence of right artificial knee joint: Secondary | ICD-10-CM | POA: Diagnosis not present

## 2019-10-22 DIAGNOSIS — Z87891 Personal history of nicotine dependence: Secondary | ICD-10-CM | POA: Insufficient documentation

## 2019-10-22 DIAGNOSIS — E119 Type 2 diabetes mellitus without complications: Secondary | ICD-10-CM | POA: Diagnosis not present

## 2019-10-22 DIAGNOSIS — Z79899 Other long term (current) drug therapy: Secondary | ICD-10-CM | POA: Diagnosis not present

## 2019-10-22 DIAGNOSIS — K429 Umbilical hernia without obstruction or gangrene: Secondary | ICD-10-CM | POA: Diagnosis not present

## 2019-10-22 DIAGNOSIS — Z91040 Latex allergy status: Secondary | ICD-10-CM | POA: Diagnosis not present

## 2019-10-22 DIAGNOSIS — E039 Hypothyroidism, unspecified: Secondary | ICD-10-CM | POA: Diagnosis not present

## 2019-10-22 DIAGNOSIS — I10 Essential (primary) hypertension: Secondary | ICD-10-CM | POA: Diagnosis not present

## 2019-10-22 DIAGNOSIS — R109 Unspecified abdominal pain: Secondary | ICD-10-CM

## 2019-10-22 DIAGNOSIS — R1031 Right lower quadrant pain: Secondary | ICD-10-CM

## 2019-10-22 DIAGNOSIS — J45909 Unspecified asthma, uncomplicated: Secondary | ICD-10-CM | POA: Insufficient documentation

## 2019-10-22 LAB — CBC WITH DIFFERENTIAL/PLATELET
Abs Immature Granulocytes: 0.02 10*3/uL (ref 0.00–0.07)
Basophils Absolute: 0.1 10*3/uL (ref 0.0–0.1)
Basophils Relative: 1 %
Eosinophils Absolute: 0.4 10*3/uL (ref 0.0–0.5)
Eosinophils Relative: 4 %
HCT: 39.1 % (ref 36.0–46.0)
Hemoglobin: 13.1 g/dL (ref 12.0–15.0)
Immature Granulocytes: 0 %
Lymphocytes Relative: 19 %
Lymphs Abs: 1.8 10*3/uL (ref 0.7–4.0)
MCH: 30.8 pg (ref 26.0–34.0)
MCHC: 33.5 g/dL (ref 30.0–36.0)
MCV: 92 fL (ref 80.0–100.0)
Monocytes Absolute: 0.4 10*3/uL (ref 0.1–1.0)
Monocytes Relative: 4 %
Neutro Abs: 6.9 10*3/uL (ref 1.7–7.7)
Neutrophils Relative %: 72 %
Platelets: 221 10*3/uL (ref 150–400)
RBC: 4.25 MIL/uL (ref 3.87–5.11)
RDW: 12.5 % (ref 11.5–15.5)
WBC: 9.5 10*3/uL (ref 4.0–10.5)
nRBC: 0 % (ref 0.0–0.2)

## 2019-10-22 LAB — URINALYSIS, ROUTINE W REFLEX MICROSCOPIC
Bacteria, UA: NONE SEEN
Bilirubin Urine: NEGATIVE
Glucose, UA: NEGATIVE mg/dL
Hgb urine dipstick: NEGATIVE
Ketones, ur: NEGATIVE mg/dL
Leukocytes,Ua: NEGATIVE
Nitrite: NEGATIVE
Protein, ur: NEGATIVE mg/dL
Specific Gravity, Urine: 1.009 (ref 1.005–1.030)
pH: 7 (ref 5.0–8.0)

## 2019-10-22 LAB — COMPREHENSIVE METABOLIC PANEL
ALT: 17 U/L (ref 0–44)
AST: 16 U/L (ref 15–41)
Albumin: 3.4 g/dL — ABNORMAL LOW (ref 3.5–5.0)
Alkaline Phosphatase: 87 U/L (ref 38–126)
Anion gap: 9 (ref 5–15)
BUN: 13 mg/dL (ref 8–23)
CO2: 24 mmol/L (ref 22–32)
Calcium: 9.1 mg/dL (ref 8.9–10.3)
Chloride: 109 mmol/L (ref 98–111)
Creatinine, Ser: 1.09 mg/dL — ABNORMAL HIGH (ref 0.44–1.00)
GFR calc Af Amer: >60 mL/min (ref >60)
GFR calc non Af Amer: 53 mL/min — ABNORMAL LOW (ref >60)
Glucose, Bld: 131 mg/dL — ABNORMAL HIGH (ref 70–99)
Potassium: 3.9 mmol/L (ref 3.5–5.1)
Sodium: 142 mmol/L (ref 135–145)
Total Bilirubin: 0.6 mg/dL (ref 0.3–1.2)
Total Protein: 7.7 g/dL (ref 6.5–8.1)

## 2019-10-22 LAB — LIPASE, BLOOD: Lipase: 28 U/L (ref 11–51)

## 2019-10-22 MED ORDER — NAPROXEN 500 MG PO TABS: 500.0000 mg | ORAL_TABLET | Freq: Two times a day (BID) | ORAL | 0 refills | Status: DC

## 2019-10-22 MED ORDER — MORPHINE SULFATE (PF) 4 MG/ML IV SOLN
4.0000 mg | Freq: Once | INTRAVENOUS | Status: AC
  Administered 2019-10-22: 4 mg via INTRAVENOUS
  Filled 2019-10-22: qty 1

## 2019-10-22 MED ORDER — LIDOCAINE 5 % EX PTCH: 1.0000 | MEDICATED_PATCH | CUTANEOUS | 0 refills | Status: DC

## 2019-10-22 MED ORDER — SODIUM CHLORIDE 0.9 % IV BOLUS
500.0000 mL | Freq: Once | INTRAVENOUS | Status: AC
  Administered 2019-10-22: 500 mL via INTRAVENOUS

## 2019-10-22 MED ORDER — ONDANSETRON HCL 4 MG/2ML IJ SOLN
4.0000 mg | Freq: Once | INTRAMUSCULAR | Status: AC
  Administered 2019-10-22: 11:00:00 4 mg via INTRAVENOUS
  Filled 2019-10-22: qty 2

## 2019-10-22 MED ORDER — METHOCARBAMOL 500 MG PO TABS: 500.0000 mg | ORAL_TABLET | Freq: Two times a day (BID) | ORAL | 0 refills | Status: DC

## 2019-10-22 NOTE — ED Triage Notes (Signed)
Pt arrives via POV with complaints of severe back and abdominal pain X2 weeks. Pt states she woke up and could not get out of the bed d/t pain. Pt tearful during triage. 10/10 pain

## 2019-10-22 NOTE — Discharge Instructions (Signed)
Take the medications as prescribed.  Follow-up outpatient with PCP in 2 days.  Return to the emergency department for new worsening symptoms.

## 2019-10-22 NOTE — ED Provider Notes (Signed)
[facility] EMERGENCY DEPARTMENT Provider Note   CSN: [id number] Arrival date & time: 10/22/19  8921    History Chief Complaint  Patient presents with  . Abdominal Pain  . Back Pain    Kayla Bean is a 64 y.o. female with past medical history significant for diabetes, hypertension, hypothyroidism presents for evaluation of right-sided flank pain.  Pain x2 weeks.  States pain will occasionally radiate into her right mid and lower quadrant.  States pain has significantly improved however worse over the last 2 days.  Pain worse with movement, particularly going from sitting, twisting.  Has been taking Tylenol at home without relief of her pain.  Rates her pain a 10/10.  Patient recent cystectomy June 2020.  Denies fever, chills, nausea, vomiting, chest pain, shortness of breath, pelvic pain, vaginal discharge, dysuria, diarrhea or constipation.  Patient denies midline back pain, paresthesias, IV drug use, bowel or bladder incontinence, saddle paresthesias.  No prior history of kidney stones, AAA.  Denies additional aggravating relieving factors.  History obtained from patient and past medical records.  No interpreter is used.  HPI     Past Medical History:  Diagnosis Date  . Arthritis   . Asthma   . Diabetes mellitus   . Heart murmur   . Hemorrhoids   . Hypertension   . Thyroid disease    hypothyroidism    Patient Active Problem List   Diagnosis Date Noted  . Class 3 severe obesity with serious comorbidity and body mass index (BMI) of 45.0 to 49.9 in adult ([employer]) 10/01/2018  . Moderate persistent asthma without complication 19/41/7408  . Hyperlipidemia 03/22/2017  . Cholelithiasis 12/24/2012  . LIPOMA 07/26/2010  . SEBACEOUS CYST 11/19/2009  . CONSTIPATION, CHRONIC 01/18/2009  . EUSTACHIAN TUBE DYSFUNCTION, RIGHT 10/16/2008  . LOM 09/01/2008  . Essential hypertension 08/18/2008  . Osteoarthritis of left knee 04/23/2008  . PLANTAR FASCIITIS, RIGHT  04/23/2008  . Hypothyroidism 10/15/2007    Past Surgical History:  Procedure Laterality Date  . ABDOMINAL HYSTERECTOMY    . CHOLECYSTECTOMY N/A 02/24/2019   Procedure: LAPAROSCOPIC CHOLECYSTECTOMY WITH INTRAOPERATIVE CHOLANGIOGRAM;  Surgeon: Donnie Mesa, MD;  Location: [facility];  Service: General;  Laterality: N/A;  . KNEE SURGERY     left  . THYROIDECTOMY    . TOTAL KNEE ARTHROPLASTY     right knee     OB History   No obstetric history on file.     Family History  Problem Relation Age of Onset  . Asthma Other   . Cancer Other   . Hypertension Other   . Diabetes Other     Social History   Tobacco Use  . Smoking status: Former Smoker    Quit date: 09/18/2001    Years since quitting: 18.1  . Smokeless tobacco: Never Used  Substance Use Topics  . Alcohol use: Yes  . Drug use: No    Home Medications Prior to Admission medications   Medication Sig Start Date End Date Taking? Authorizing Provider  Accu-Chek FastClix Lancets MISC Use as directed 04/25/19   Ladell Pier, MD  Albuterol Sulfate (PROAIR RESPICLICK) 144 (90 Base) MCG/ACT AEPB Inhale 2 puffs into the lungs every 6 (six) hours as needed (for breathing). 03/22/17   Ladell Pier, MD  amLODipine (NORVASC) 10 MG tablet Take 1 tablet (10 mg total) by mouth daily. 06/30/19   Ladell Pier, MD  aspirin 81 MG tablet Take 1 tablet (81 mg total) daily by mouth. 07/30/17  Ladell Pier, MD  Blood Glucose Monitoring Suppl (ACCU-CHEK GUIDE ME) w/Device KIT 1 Device by Does not apply route daily. 04/25/19   Ladell Pier, MD  brimonidine-timolol (COMBIGAN) 0.2-0.5 % ophthalmic solution Place 1 drop into both eyes every 12 (twelve) hours.    [provider]  cycloSPORINE (RESTASIS) 0.05 % ophthalmic emulsion Place 1 drop into both eyes 2 (two) times daily.    [provider]  fluticasone (FLONASE) 50 MCG/ACT nasal spray Place 2 sprays into both nostrils daily. 08/23/18   Wurst, [place], PA-C    glipiZIDE (GLUCOTROL) 10 MG tablet Take 1 tablet (10 mg total) by mouth 2 (two) times daily before a meal. Please make PCP appointment. 10/03/19   Ladell Pier, MD  glucose blood (ACCU-CHEK GUIDE) test strip Use as instructed 04/25/19   Ladell Pier, MD  glucose blood (TRUE METRIX BLOOD GLUCOSE TEST) test strip Use as instructed to check blood sugar 3 times daily. E11.9 07/31/17   Ladell Pier, MD  JANUVIA 100 MG tablet Take 1 tablet by mouth daily 07/29/19   Ladell Pier, MD  Lancet Devices [facility]) lancets Use as instructed for 3 times daily testing of blood glucose 08/09/16   Langeland, Dawn T, MD  Lancets Misc. (ACCU-CHEK FASTCLIX LANCET) KIT Use as directed 04/25/19   Ladell Pier, MD  latanoprost (XALATAN) 0.005 % ophthalmic solution Place 1 drop into both eyes at bedtime.     [provider]  levothyroxine (SYNTHROID) 88 MCG tablet Take 1 tablet (88 mcg total) by mouth daily. 03/27/19   Ladell Pier, MD  lidocaine (LIDODERM) 5 % Place 1 patch onto the skin daily. Remove & Discard patch within 12 hours or as directed by MD 10/22/19   Kynnadi Dicenso A, PA-C  lisinopril (ZESTRIL) 20 MG tablet Take 1 tablet (20 mg total) by mouth daily. 04/25/19   Ladell Pier, MD  methocarbamol (ROBAXIN) 500 MG tablet Take 1 tablet (500 mg total) by mouth 2 (two) times daily. 10/22/19   Pike Scantlebury A, PA-C  naproxen (NAPROSYN) 500 MG tablet Take 1 tablet (500 mg total) by mouth 2 (two) times daily. 10/22/19   Teana Lindahl A, PA-C  polyethylene glycol (MIRALAX / GLYCOLAX) 17 g packet Take 17 g by mouth daily as needed for mild constipation. 02/25/19   Maczis, Barth Kirks, PA-C  pravastatin (PRAVACHOL) 40 MG tablet TAKE 1 TABLET BY MOUTH ONCE DAILY (DOSE  INCREASE) 08/26/19   Ladell Pier, MD  TRUEPLUS LANCETS 28G MISC Use as directed 05/10/17   Ladell Pier, MD    Allergies    Latex, Metformin and related, Penicillins, Warfarin sodium, and  Iodinated diagnostic agents  Review of Systems   Review of Systems  Constitutional: Negative.   HENT: Negative.   Eyes: Negative.   Respiratory: Negative.   Cardiovascular: Negative.   Gastrointestinal: Positive for abdominal pain (Right mid quadrant). Negative for abdominal distention, anal bleeding, blood in stool, constipation, diarrhea, nausea, rectal pain and vomiting.  Genitourinary: Positive for flank pain. Negative for decreased urine volume, difficulty urinating, dysuria, frequency, genital sores, hematuria, menstrual problem, pelvic pain, urgency, vaginal bleeding and vaginal discharge.  Musculoskeletal: Negative for back pain, gait problem, joint swelling, myalgias, neck pain and neck stiffness.  Skin: Negative.   Neurological: Negative.   All other systems reviewed and are negative.   Physical Exam Updated Vital Signs BP 109/71 (BP Location: Left Arm)   Pulse (!) 58   Temp 98.5  F (36.9 C) (Oral)   Resp 12   Ht '5\' 2"'$  (1.575 m)   Wt 115.2 kg   SpO2 100%   BMI 46.46 kg/m   Physical Exam Vitals and nursing note reviewed.  Constitutional:      General: She is not in acute distress.    Appearance: She is well-developed. She is obese. She is not ill-appearing, toxic-appearing or diaphoretic.     Comments: Tearful in room  HENT:     Head: Normocephalic and atraumatic.     Mouth/Throat:     Mouth: Mucous membranes are moist.  Eyes:     Pupils: Pupils are equal, round, and reactive to light.  Cardiovascular:     Rate and Rhythm: Normal rate.     Pulses:          Dorsalis pedis pulses are 2+ on the right side and 2+ on the left side.       Posterior tibial pulses are 2+ on the right side and 2+ on the left side.     Heart sounds: Normal heart sounds.  Pulmonary:     Effort: Pulmonary effort is normal. No respiratory distress.     Breath sounds: Normal breath sounds.     Comments: Speaks in full sentences without difficulty.  Lungs clear bilaterally. Abdominal:      General: Bowel sounds are normal. There is no distension.     Palpations: Abdomen is soft.     Tenderness: There is abdominal tenderness. There is no left CVA tenderness, guarding or rebound. Negative signs include Murphy's sign, Rovsing's sign and McBurney's sign.     Hernia: No hernia is present.       Comments: Tenderness palpation diffusely across thoracic and lumbar right flank.  Mild tenderness palpation to right mid quadrant however negative Murphy sign, epigastric pain or right lower quadrant pain.  No rebound or guarding.  Musculoskeletal:        General: Normal range of motion.     Cervical back: Normal and normal range of motion.     Thoracic back: Normal.     Lumbar back: Normal.       Back:     Comments: Moves all 4 extremities without difficulty.  No midline lumbar pain.  There is some mild right-sided paraspinal pain.  Compartments soft. Homans sign negative  Skin:    General: Skin is warm and dry.     Capillary Refill: Capillary refill takes less than 2 seconds.     Comments: Brisk cap refill. No overlying skin changes to back  Neurological:     Mental Status: She is alert.     Cranial Nerves: Cranial nerves are intact.     Sensory: Sensation is intact.     Motor: Motor function is intact.     Coordination: Coordination is intact.     Comments: CN 2-12 grossly intact. Ambulatory however with pain with movement to right mid back.    ED Results / Procedures / Treatments   Labs (all labs ordered are listed, but only abnormal results are displayed) Labs Reviewed  COMPREHENSIVE METABOLIC PANEL - Abnormal; Notable for the following components:      Result Value   Glucose, Bld 131 (*)    Creatinine, Ser 1.09 (*)    Albumin 3.4 (*)    GFR calc non Af Amer 53 (*)    All other components within normal limits  URINALYSIS, ROUTINE W REFLEX MICROSCOPIC - Abnormal; Notable for the following components:  Color, Urine STRAW (*)    All other components within normal limits    URINE CULTURE  CBC WITH DIFFERENTIAL/PLATELET  LIPASE, BLOOD    EKG None  Radiology CT ABDOMEN PELVIS WO CONTRAST  Result Date: 10/22/2019 CLINICAL DATA:  Abdominal pain, 2 weeks diffuse. EXAM: CT ABDOMEN AND PELVIS WITHOUT CONTRAST TECHNIQUE: Multidetector CT imaging of the abdomen and pelvis was performed following the standard protocol without IV contrast. COMPARISON:  02/24/2019 FINDINGS: Lower chest: No signs of consolidation or pleural effusion. No pericardial fluid. Heart is incompletely imaged. Hepatobiliary: Mildly lobulated hepatic contours. Post cholecystectomy without signs of biliary ductal dilation. Pancreas: Normal appearance of pancreas without ductal dilation. Spleen: Spleen normal size without focal lesion. Adrenals/Urinary Tract: Adrenal glands mildly thickened. Renal contours are smooth without signs of perinephric stranding or hydronephrosis. No signs of ureteral or renal calculus. Stomach/Bowel: Normal appendix. No signs of bowel obstruction or acute bowel process. Stool fills much of the colon. Vascular/Lymphatic: Normal caliber abdominal aorta. No signs of adenopathy in the retroperitoneum or upper abdomen. No signs of change with respect to mildly enlarged pelvic and bilateral inguinal lymph nodes largest is a left inguinal lymph node 1.3 cm. Scattered lymph nodes throughout the pelvis. These are essentially unchanged dating back to 2014. Reproductive: Post hysterectomy. Left ovary remains in place measuring 2.9 x 2.3 cm, unchanged compared to June of 2020 evaluation, essentially unchanged also compared to 2018 study. Other: Mild stranding about a fat containing umbilical hernia. No focal fluid. Musculoskeletal: Signs of degenerative changes of the bilateral hips right worse than left likely with background avascular necrosis, similar to previous studies Signs of prior fracture of right-sided ribs and anterior left-sided ribs no signs of acute bone process. IMPRESSION: 1. Mild  stranding about a fat containing umbilical hernia, correlate with physical exam. 2. Normal appendix. 3. Post cholecystectomy and hysterectomy. 4. Stable fullness of left ovary dating back to 2018 and stable mildly enlarged pelvic lymph nodes since 2014. Electronically Signed   By: Zetta Bills M.D.   On: 10/22/2019 14:52    Procedures Procedures (including critical care time)  Medications Ordered in ED Medications  morphine 4 MG/ML injection 4 mg (4 mg Intravenous Given 10/22/19 1117)  ondansetron (ZOFRAN) injection 4 mg (4 mg Intravenous Given 10/22/19 1115)  sodium chloride 0.9 % bolus 500 mL (0 mLs Intravenous Stopped 10/22/19 1545)    ED Course  I have reviewed the triage vital signs and the nursing notes.  Pertinent labs & imaging results that were available during my care of the patient were reviewed by me and considered in my medical decision making (see chart for details).  64 year old female appears otherwise well presents for evaluation of right flank pain.  No urinary symptoms.  Pain worse with movement.  Afebrile, nonseptic, non-ill-appearing.  No recent trauma or injury.  Heart and lungs clear, denies chest pain.  Patient has some mild right mid abdominal pain however has negative Murphy sign, McBurney point. She had did have cholecystectomy June 2020.  Pain does not change with food intake.  No systemic symptoms, tolerating p.o. intake at home.  She has no red flags for back pain.  No midline back pain, crepitus or step-offs.  I am able to reproduce her pain to palpation of her right flank.  Plan for pain management, imaging.  Patient has contrast allergy dye.  Will obtain CT scan abdomen without contrast given allergy. Spinal tenderness to suggest lumbar thoracic etiology. No chest pain, shortness of breath, hemoptysis, evidence  of DVT on exam.  She is not tachycardic, tachypneic or hypoxic.  I have low suspicion for acute atypical ACS, PE, dissection for ruptured AAA given exam and  pain x 2 weeks.  Labs and imaging personally reviewed and interpreted: CT AP periumbilical hernia however no evidence of incarceration or strangulation on exam EKG without ST/T changes UA without evidence of infection CBC without leukocytosis, hemoglobin stable at 84.5 Metabolic panel with mild hyperglycemia, creatinine 1.09, no additional parenchymal renal or liver abnormality  Reassess.  No pain currently.  Reevaluation abdomen soft, nontender.  Nonsurgical abdomen.  Patient reassessed.  She is ambulatory in ED without difficulty.  Pain has resolved.  Continued benign abdominal exam.  No red flags for back pain.  No concerns for cardiac or pulmonary etiology.  CT scan reassuring however did have to obtain without contrast due to dye allergy.  No acute findings on CT scan. No elevation of LFTs, negative Murphy sign, have low suspicion for CBD stone.  I have low suspicion vascular or infectious etiology.  Labs reassuring.  I suspect MSK sprain or strain given pain reproducible to palpation as well as worse with movement.  DC home with strict return precautions, pain management.  She is to follow-up with her PCP.  Patient voiced understanding and is agreeable for follow-up.  Patient is nontoxic, nonseptic appearing, in no apparent distress.  Patient's pain and other symptoms adequately managed in emergency department.  Fluid bolus given.  Labs, imaging and vitals reviewed.  Patient does not meet the SIRS or Sepsis criteria.  On repeat exam patient does not have a surgical abdomin and there are no peritoneal signs.  No indication of appendicitis, bowel obstruction, bowel perforation, cholecystitis, diverticulitis.   The patient has been appropriately medically screened and/or stabilized in the ED. I have low suspicion for any other emergent medical condition which would require further screening, evaluation or treatment in the ED or require inpatient management.  Patient is hemodynamically stable and  in no acute distress.  Patient able to ambulate in department prior to ED.  Evaluation does not show acute pathology that would require ongoing or additional emergent interventions while in the emergency department or further inpatient treatment.  I have discussed the diagnosis with the patient and answered all questions.  Pain is been managed while in the emergency department and patient has no further complaints prior to discharge.  Patient is comfortable with plan discussed in room and is stable for discharge at this time.  I have discussed strict return precautions for returning to the emergency department.  Patient was encouraged to follow-up with PCP/specialist refer to at discharge.     MDM Rules/Calculators/A&P                       Final Clinical Impression(s) / ED Diagnoses Final diagnoses:  Flank pain  Right lower quadrant abdominal pain    Rx / DC Orders ED Discharge Orders         Ordered    naproxen (NAPROSYN) 500 MG tablet  2 times daily     10/22/19 1513    methocarbamol (ROBAXIN) 500 MG tablet  2 times daily     10/22/19 1513    lidocaine (LIDODERM) 5 %  Every 24 hours     10/22/19 1513           Videl Nobrega A, PA-C 10/22/19 [place], [place], DO 10/23/19 [phone]

## 2019-10-23 LAB — URINE CULTURE: Culture: <10000 — AB

## 2019-11-04 ENCOUNTER — Ambulatory Visit: Payer: Medicare HMO | Attending: Internal Medicine | Admitting: Internal Medicine

## 2019-11-04 ENCOUNTER — Other Ambulatory Visit: Payer: Self-pay

## 2019-11-04 DIAGNOSIS — E890 Postprocedural hypothyroidism: Secondary | ICD-10-CM

## 2019-11-04 DIAGNOSIS — E1169 Type 2 diabetes mellitus with other specified complication: Secondary | ICD-10-CM | POA: Diagnosis not present

## 2019-11-04 DIAGNOSIS — K5904 Chronic idiopathic constipation: Secondary | ICD-10-CM

## 2019-11-04 DIAGNOSIS — E785 Hyperlipidemia, unspecified: Secondary | ICD-10-CM

## 2019-11-04 DIAGNOSIS — E119 Type 2 diabetes mellitus without complications: Secondary | ICD-10-CM | POA: Diagnosis not present

## 2019-11-04 DIAGNOSIS — Z23 Encounter for immunization: Secondary | ICD-10-CM

## 2019-11-04 DIAGNOSIS — I10 Essential (primary) hypertension: Secondary | ICD-10-CM

## 2019-11-04 DIAGNOSIS — Z6841 Body Mass Index (BMI) 40.0 and over, adult: Secondary | ICD-10-CM

## 2019-11-04 MED ORDER — POLYETHYLENE GLYCOL 3350 17 G PO PACK: 17.0000 g | PACK | Freq: Every day | ORAL | 5 refills | Status: DC | PRN

## 2019-11-04 MED ORDER — SITAGLIPTIN PHOSPHATE 100 MG PO TABS: 100.0000 mg | ORAL_TABLET | Freq: Every day | ORAL | 2 refills | Status: DC

## 2019-11-04 MED ORDER — AMLODIPINE BESYLATE 10 MG PO TABS: 10.0000 mg | ORAL_TABLET | Freq: Every day | ORAL | 2 refills | Status: DC

## 2019-11-04 MED ORDER — GLIPIZIDE 10 MG PO TABS: 10.0000 mg | ORAL_TABLET | Freq: Two times a day (BID) | ORAL | 2 refills | Status: DC

## 2019-11-04 MED ORDER — LISINOPRIL 20 MG PO TABS: 20.0000 mg | ORAL_TABLET | Freq: Every day | ORAL | 2 refills | Status: DC

## 2019-11-04 MED ORDER — PRAVASTATIN SODIUM 40 MG PO TABS: ORAL_TABLET | ORAL | 2 refills | Status: DC

## 2019-11-04 MED ORDER — LEVOTHYROXINE SODIUM 88 MCG PO TABS: 88.0000 ug | ORAL_TABLET | Freq: Every day | ORAL | 2 refills | Status: DC

## 2019-11-04 NOTE — Progress Notes (Signed)
Virtual Visit via Telephone Note Due to current restrictions/limitations of in-office visits due to the COVID-19 pandemic, this scheduled clinical appointment was converted to a telehealth visit  I connected with Kayla Bean on 11/04/19 at 11:40 a.m by telephone and verified that I am speaking with the correct person using two identifiers. I am in my office.  The patient is at home.  Only the patient and myself participated in this encounter.  I discussed the limitations, risks, security and privacy concerns of performing an evaluation and management service by telephone and the availability of in person appointments. I also discussed with the patient that there may be a patient responsible charge related to this service. The patient expressed understanding and agreed to proceed.   History of Present Illness: Patient with history of diabetes, hypertension,HL,hypothyroidism, asthma, CKD stage III, and obesity.   Seen in ER 10/22/19 with RT flank pain that had started 2 wks prior.  Reports that every morning she woke up with the pain.  Work-up in the emergency room revealed normal lipase level, normal UA.  CAT scan of the abdomen without contrast revealed no acute findings. -Prescribed Robaxin, Lidoderm patches and Naprosyn.  Uses the Naprosyn and Robaxin only at nights.  Reports she is feeling much better.  Feels pain if she is up doing a lot during the day.  She thinks it is her mattress.   DM/Obesity:  Checks BS 2 x a wk.  Most recent 139 and 146. Compliant with Januvia and Glucotrol Eating habits:  Doing good. Avoids junk and fried foods.  Less white carbs.  Eats 100% wheat grain bread. wgh is 254 lbs, up 7 lbs since 04/2019 Not getting in much exercise but stays active around the house.  Afraid of getting out due to [place] pandemic.  Wants to know if she should get COVID vaccine  HTN:  No device to check BP.  However recent blood pressure when she was in the emergency room was  107/71 Limits salt in foods Compliant with Norvasc and Lisinopril.   No CP/SOB/HA/dizziness/lower extremity edema  Hypothyroid:  Taking Levothyroxine.  No palpitations.  Request to be placed on Linzess again.  Eats a lot of green leafy veggies but still has problems moving bowels regularly.  She tells me that the [place] really did not work well for her and wanted to know if there is something else.  Was on MiraLAX before and felt that worked well.  Has BM about 2 x a wk currently and has to strain.  She is up-to-date with colon cancer screening.   Outpatient Encounter Medications as of 11/04/2019  Medication Sig  . Accu-Chek FastClix Lancets MISC Use as directed  . Albuterol Sulfate (PROAIR RESPICLICK) 836 (90 Base) MCG/ACT AEPB Inhale 2 puffs into the lungs every 6 (six) hours as needed (for breathing).  Marland Kitchen amLODipine (NORVASC) 10 MG tablet Take 1 tablet (10 mg total) by mouth daily.  Marland Kitchen aspirin 81 MG tablet Take 1 tablet (81 mg total) daily by mouth.  . Blood Glucose Monitoring Suppl (ACCU-CHEK GUIDE ME) w/Device KIT 1 Device by Does not apply route daily.  . brimonidine-timolol (COMBIGAN) 0.2-0.5 % ophthalmic solution Place 1 drop into both eyes every 12 (twelve) hours.  . cycloSPORINE (RESTASIS) 0.05 % ophthalmic emulsion Place 1 drop into both eyes 2 (two) times daily.  . fluticasone (FLONASE) 50 MCG/ACT nasal spray Place 2 sprays into both nostrils daily.  Marland Kitchen glipiZIDE (GLUCOTROL) 10 MG tablet Take 1 tablet (10 mg total)  by mouth 2 (two) times daily before a meal. Please make PCP appointment.  Marland Kitchen glucose blood (ACCU-CHEK GUIDE) test strip Use as instructed  . glucose blood (TRUE METRIX BLOOD GLUCOSE TEST) test strip Use as instructed to check blood sugar 3 times daily. E11.9  . JANUVIA 100 MG tablet Take 1 tablet by mouth daily  . Lancet Devices (ACCU-CHEK SOFTCLIX) lancets Use as instructed for 3 times daily testing of blood glucose  . Lancets Misc. (ACCU-CHEK FASTCLIX LANCET) KIT Use as  directed  . latanoprost (XALATAN) 0.005 % ophthalmic solution Place 1 drop into both eyes at bedtime.   Marland Kitchen levothyroxine (SYNTHROID) 88 MCG tablet Take 1 tablet (88 mcg total) by mouth daily.  Marland Kitchen lidocaine (LIDODERM) 5 % Place 1 patch onto the skin daily. Remove & Discard patch within 12 hours or as directed by MD  . lisinopril (ZESTRIL) 20 MG tablet Take 1 tablet (20 mg total) by mouth daily.  . methocarbamol (ROBAXIN) 500 MG tablet Take 1 tablet (500 mg total) by mouth 2 (two) times daily.  . naproxen (NAPROSYN) 500 MG tablet Take 1 tablet (500 mg total) by mouth 2 (two) times daily.  . polyethylene glycol (MIRALAX / GLYCOLAX) 17 g packet Take 17 g by mouth daily as needed for mild constipation.  . pravastatin (PRAVACHOL) 40 MG tablet TAKE 1 TABLET BY MOUTH ONCE DAILY (DOSE  INCREASE)  . TRUEPLUS LANCETS 28G MISC Use as directed   No facility-administered encounter medications on file as of 11/04/2019.    Observations/Objective: Results for orders placed or performed during the hospital encounter of 10/22/19  Urine Culture   Specimen: Urine, Random  Result Value Ref Range   Specimen Description URINE, RANDOM    Special Requests NONE    Culture (A)     <10,000 COLONIES/mL INSIGNIFICANT GROWTH Performed at [facility] [address], [place], [place] [address]    Report Status 10/23/2019 FINAL   CBC with Differential  Result Value Ref Range   WBC 9.5 4.0 - 10.5 K/uL   RBC 4.25 3.87 - 5.11 MIL/uL   Hemoglobin 13.1 12.0 - 15.0 g/dL   HCT 39.1 36.0 - 46.0 %   MCV 92.0 80.0 - 100.0 fL   MCH 30.8 26.0 - 34.0 pg   MCHC 33.5 30.0 - 36.0 g/dL   RDW 12.5 11.5 - 15.5 %   Platelets 221 150 - 400 K/uL   nRBC 0.0 0.0 - 0.2 %   Neutrophils Relative % 72 %   Neutro Abs 6.9 1.7 - 7.7 K/uL   Lymphocytes Relative 19 %   Lymphs Abs 1.8 0.7 - 4.0 K/uL   Monocytes Relative 4 %   Monocytes Absolute 0.4 0.1 - 1.0 K/uL   Eosinophils Relative 4 %   Eosinophils Absolute 0.4 0.0 - 0.5 K/uL    Basophils Relative 1 %   Basophils Absolute 0.1 0.0 - 0.1 K/uL   Immature Granulocytes 0 %   Abs Immature Granulocytes 0.02 0.00 - 0.07 K/uL  Comprehensive metabolic panel  Result Value Ref Range   Sodium 142 135 - 145 mmol/L   Potassium 3.9 3.5 - 5.1 mmol/L   Chloride 109 98 - 111 mmol/L   CO2 24 22 - 32 mmol/L   Glucose, Bld 131 (H) 70 - 99 mg/dL   BUN 13 8 - 23 mg/dL   Creatinine, Ser 1.09 (H) 0.44 - 1.00 mg/dL   Calcium 9.1 8.9 - 10.3 mg/dL   Total Protein 7.7 6.5 - 8.1  g/dL   Albumin 3.4 (L) 3.5 - 5.0 g/dL   AST 16 15 - 41 U/L   ALT 17 0 - 44 U/L   Alkaline Phosphatase 87 38 - 126 U/L   Total Bilirubin 0.6 0.3 - 1.2 mg/dL   GFR calc non Af Amer 53 (L) >60 mL/min   GFR calc Af Amer >60 >60 mL/min   Anion gap 9 5 - 15  Lipase, blood  Result Value Ref Range   Lipase 28 11 - 51 U/L  Urinalysis, Routine w reflex microscopic  Result Value Ref Range   Color, Urine STRAW (A) YELLOW   APPearance CLEAR CLEAR   Specific Gravity, Urine 1.009 1.005 - 1.030   pH 7.0 5.0 - 8.0   Glucose, UA NEGATIVE NEGATIVE mg/dL   Hgb urine dipstick NEGATIVE NEGATIVE   Bilirubin Urine NEGATIVE NEGATIVE   Ketones, ur NEGATIVE NEGATIVE mg/dL   Protein, ur NEGATIVE NEGATIVE mg/dL   Nitrite NEGATIVE NEGATIVE   Leukocytes,Ua NEGATIVE NEGATIVE   RBC / HPF 0-5 0 - 5 RBC/hpf   WBC, UA 0-5 0 - 5 WBC/hpf   Bacteria, UA NONE SEEN NONE SEEN   Squamous Epithelial / LPF 0-5 0 - 5     Assessment and Plan: 1. Controlled type 2 diabetes mellitus without complication, without long-term current use of insulin ([place]) -Patient to continue glipizide and Januvia.  She will come to the lab next week for an A1c.  Commended her on her eating habits and encouraged her to keep up the good work. Encouraged her to try to get out more if only to walk for 15 minutes daily. - glipiZIDE (GLUCOTROL) 10 MG tablet; Take 1 tablet (10 mg total) by mouth 2 (two) times daily before a meal.  Dispense: 180 tablet; Refill: 2 -  sitaGLIPtin (JANUVIA) 100 MG tablet; Take 1 tablet (100 mg total) by mouth daily.  Dispense: 90 tablet; Refill: 2 - Hemoglobin A1c; Future  2. Essential hypertension At goal.  Continue current medications and low-salt diet - amLODipine (NORVASC) 10 MG tablet; Take 1 tablet (10 mg total) by mouth daily.  Dispense: 90 tablet; Refill: 2 - lisinopril (ZESTRIL) 20 MG tablet; Take 1 tablet (20 mg total) by mouth daily.  Dispense: 90 tablet; Refill: 2  3. Postoperative hypothyroidism - levothyroxine (SYNTHROID) 88 MCG tablet; Take 1 tablet (88 mcg total) by mouth daily.  Dispense: 90 tablet; Refill: 2  4. Hyperlipidemia associated with type 2 diabetes mellitus (HCC) - pravastatin (PRAVACHOL) 40 MG tablet; TAKE 1 TABLET BY MOUTH ONCE DAILY (DOSE  INCREASE)  Dispense: 90 tablet; Refill: 2  5. Class 3 severe obesity with serious comorbidity and body mass index (BMI) of 45.0 to 49.9 in adult, unspecified obesity type ([place]) See #1 above  6. Need for vaccination against Streptococcus pneumoniae Patient will come next week to get the Pneumovax from a clinical pharmacist  7. High priority for COVID-19 virus vaccination Encouraged her to get the COVID-19 vaccine because she is at high risk of complications if she gets Covid infection.  8. Chronic idiopathic constipation - polyethylene glycol (MIRALAX / GLYCOLAX) 17 g packet; Take 17 g by mouth daily as needed for mild constipation.  Dispense: 14 each; Refill: 5   Follow Up Instructions: 7 wks for [facility]ness Visit   I discussed the assessment and treatment plan with the patient. The patient was provided an opportunity to ask questions and all were answered. The patient agreed with the plan and demonstrated an understanding  of the instructions.   The patient was advised to call back or seek an in-person evaluation if the symptoms worsen or if the condition fails to improve as anticipated.  I provided 21 minutes of non-face-to-face  time during this encounter.   Karle Plumber, MD

## 2019-11-04 NOTE — Progress Notes (Signed)
Pt is needing a new rx for Linzess 72MCG . Pt states she thought she didn't need medication anymore but she does

## 2019-11-10 ENCOUNTER — Telehealth: Payer: Self-pay | Admitting: Internal Medicine

## 2019-11-10 DIAGNOSIS — E890 Postprocedural hypothyroidism: Secondary | ICD-10-CM

## 2019-11-10 NOTE — Telephone Encounter (Signed)
Dr. Wynetta Emery -   Are you okay to change brands? Recommend to get f/u thyroid labs in 6 weeks if change is approved.

## 2019-11-10 NOTE — Telephone Encounter (Signed)
[place] called to request a change in medication -levothyroxine (SYNTHROID) 88 MCG tablet  This medication Is currently on back order and would like the brand changed to Alvogen if possible. Please follow up

## 2019-11-11 MED ORDER — LEVOTHYROXINE SODIUM 88 MCG PO TABS: 88.0000 ug | ORAL_TABLET | Freq: Every day | ORAL | 2 refills | Status: DC

## 2019-11-11 NOTE — Addendum Note (Signed)
Addended by: Karle Plumber B on: 11/11/2019 02:05 PM   Modules accepted: Orders

## 2019-11-11 NOTE — Telephone Encounter (Signed)
I contacted her pharmacy. [facility] uses a particular brand, Sandoz, which is the same brand we were using at a time. This brand has been discontinued.

## 2019-11-12 ENCOUNTER — Other Ambulatory Visit: Payer: Self-pay

## 2019-11-12 ENCOUNTER — Ambulatory Visit: Payer: Medicare HMO | Attending: Internal Medicine | Admitting: Pharmacist

## 2019-11-12 DIAGNOSIS — E119 Type 2 diabetes mellitus without complications: Secondary | ICD-10-CM

## 2019-11-12 DIAGNOSIS — Z23 Encounter for immunization: Secondary | ICD-10-CM | POA: Diagnosis not present

## 2019-11-12 DIAGNOSIS — E890 Postprocedural hypothyroidism: Secondary | ICD-10-CM | POA: Diagnosis not present

## 2019-11-12 DIAGNOSIS — R200 Anesthesia of skin: Secondary | ICD-10-CM | POA: Diagnosis not present

## 2019-11-12 NOTE — Progress Notes (Signed)
Patient presents for vaccination against strep pneumo per orders of Dr. Dorthula Rue. Consent given. Counseling provided. No contraindications exists. Vaccine administered without incident.

## 2019-11-12 NOTE — Telephone Encounter (Signed)
Luke may you take care of this  

## 2019-11-13 LAB — HEMOGLOBIN A1C
Est. average glucose Bld gHb Est-mCnc: 169 mg/dL
Hgb A1c MFr Bld: 7.5 % — ABNORMAL HIGH (ref 4.8–5.6)

## 2019-11-13 LAB — TSH: TSH: 10.6 u[IU]/mL — ABNORMAL HIGH (ref 0.450–4.500)

## 2019-11-13 LAB — VITAMIN B12: Vitamin B-12: 1762 pg/mL — ABNORMAL HIGH (ref 232–1245)

## 2019-11-13 NOTE — Telephone Encounter (Signed)
Call placed to pharmacy to approve NDC change for levothyroxine. I attempted to call the patient to inform her of this and Dr. Durenda Age recommendation for f/u. Pt was unavailable and her mailbox is currently full. Will attempt to reach her later today.

## 2019-11-15 ENCOUNTER — Other Ambulatory Visit: Payer: Self-pay | Admitting: Internal Medicine

## 2019-11-15 DIAGNOSIS — E890 Postprocedural hypothyroidism: Secondary | ICD-10-CM

## 2019-11-15 NOTE — Progress Notes (Signed)
Let patient know that her A1c is 7.5 with goal being less than 7.  Continue Januvia and glipizide.  Work on improving healthy eating habits and regular exercise.  Thyroid level is off.  Please confirm that she was taking the levothyroxine 88 mcg every day.  If she has been taking it consistently, then we need to increase the dose to 100 mcg daily.  Please let me know her response so that I can send an updated prescription to her pharmacy.  After being on the increased dose for 6 weeks, she should return to the lab to have the thyroid level rechecked.  I have placed the order.  Her vitamin B12 level is elevated.  If she is taking B12 supplement, she should discontinue doing so.

## 2019-11-19 ENCOUNTER — Telehealth: Payer: Self-pay

## 2019-11-19 DIAGNOSIS — E890 Postprocedural hypothyroidism: Secondary | ICD-10-CM

## 2019-11-19 NOTE — Telephone Encounter (Signed)
Contacted pt to go over lab results pt is aware. Pt sates she is taking her thyroid mediation everyday. Pt states she will call and schedule lab visit when she picks up new rx. Pt doesn't have any questions or concerns.

## 2019-11-20 MED ORDER — LEVOTHYROXINE SODIUM 100 MCG PO TABS: 100.0000 ug | ORAL_TABLET | Freq: Every day | ORAL | 3 refills | Status: DC

## 2019-11-20 NOTE — Addendum Note (Signed)
Addended by: Karle Plumber B on: 11/20/2019 12:45 PM   Modules accepted: Orders

## 2019-11-20 NOTE — Telephone Encounter (Signed)
Contacted pt and made aware that rx was sent for increased levothyroxine. Pt doesn't have any questions or concerns

## 2019-11-29 ENCOUNTER — Ambulatory Visit: Payer: Medicare HMO | Attending: Internal Medicine

## 2019-11-29 DIAGNOSIS — Z23 Encounter for immunization: Secondary | ICD-10-CM

## 2019-11-29 NOTE — Progress Notes (Signed)
   Covid-19 Vaccination Clinic  Name:  Kayla Bean    MRN: [id number] DOB: 06-13-1956  11/29/2019  [name] was observed post Covid-19 immunization for 15 minutes without incident. She was provided with Vaccine Information Sheet and instruction to access the V-Safe system.   [name] was instructed to call 911 with any severe reactions post vaccine: Marland Kitchen Difficulty breathing  . Swelling of face and throat  . A fast heartbeat  . A bad rash all over body  . Dizziness and weakness   Immunizations Administered    Name Date Dose VIS Date Route   Pfizer COVID-19 Vaccine 11/29/2019  4:38 PM 0.3 mL 08/29/2019 Intramuscular   Manufacturer: [place]   Lot: [id number]   [place]: [id number]

## 2019-12-23 ENCOUNTER — Ambulatory Visit: Payer: Medicare HMO

## 2019-12-24 ENCOUNTER — Ambulatory Visit: Payer: Medicare HMO

## 2019-12-26 ENCOUNTER — Ambulatory Visit (HOSPITAL_COMMUNITY): Payer: Medicare HMO

## 2019-12-26 ENCOUNTER — Encounter (HOSPITAL_COMMUNITY): Payer: Self-pay

## 2019-12-26 ENCOUNTER — Ambulatory Visit (INDEPENDENT_AMBULATORY_CARE_PROVIDER_SITE_OTHER): Payer: Medicare HMO

## 2019-12-26 ENCOUNTER — Other Ambulatory Visit: Payer: Self-pay

## 2019-12-26 ENCOUNTER — Ambulatory Visit: Payer: Medicare HMO | Admitting: Internal Medicine

## 2019-12-26 ENCOUNTER — Ambulatory Visit (HOSPITAL_COMMUNITY)
Admission: EM | Admit: 2019-12-26 | Discharge: 2019-12-26 | Disposition: A | Discharge status: Home / Self Care | Payer: Medicare HMO | Attending: Urgent Care | Admitting: Urgent Care

## 2019-12-26 DIAGNOSIS — M25532 Pain in left wrist: Secondary | ICD-10-CM | POA: Diagnosis not present

## 2019-12-26 DIAGNOSIS — S6992XA Unspecified injury of left wrist, hand and finger(s), initial encounter: Secondary | ICD-10-CM | POA: Diagnosis not present

## 2019-12-26 DIAGNOSIS — M79632 Pain in left forearm: Secondary | ICD-10-CM

## 2019-12-26 DIAGNOSIS — W19XXXA Unspecified fall, initial encounter: Secondary | ICD-10-CM | POA: Diagnosis not present

## 2019-12-26 DIAGNOSIS — S60222A Contusion of left hand, initial encounter: Secondary | ICD-10-CM | POA: Diagnosis not present

## 2019-12-26 DIAGNOSIS — M79642 Pain in left hand: Secondary | ICD-10-CM

## 2019-12-26 DIAGNOSIS — S9032XA Contusion of left foot, initial encounter: Secondary | ICD-10-CM

## 2019-12-26 DIAGNOSIS — M25432 Effusion, left wrist: Secondary | ICD-10-CM

## 2019-12-26 DIAGNOSIS — E119 Type 2 diabetes mellitus without complications: Secondary | ICD-10-CM | POA: Diagnosis not present

## 2019-12-26 DIAGNOSIS — M79672 Pain in left foot: Secondary | ICD-10-CM

## 2019-12-26 DIAGNOSIS — S60212A Contusion of left wrist, initial encounter: Secondary | ICD-10-CM | POA: Diagnosis not present

## 2019-12-26 DIAGNOSIS — S99922A Unspecified injury of left foot, initial encounter: Secondary | ICD-10-CM | POA: Diagnosis not present

## 2019-12-26 MED ORDER — ACETAMINOPHEN 325 MG PO TABS
ORAL_TABLET | ORAL | Status: AC
  Filled 2019-12-26: qty 2

## 2019-12-26 MED ORDER — ACETAMINOPHEN 325 MG PO TABS
650.0000 mg | ORAL_TABLET | Freq: Once | ORAL | Status: AC
  Administered 2019-12-26: 650 mg via ORAL

## 2019-12-26 MED ORDER — TRAMADOL HCL 50 MG PO TABS: 50.0000 mg | ORAL_TABLET | Freq: Four times a day (QID) | ORAL | 0 refills | Status: DC | PRN

## 2019-12-26 NOTE — ED Triage Notes (Addendum)
Pt reports she tripped and feel over her left side yesterday. Pt states having left hand pain and left foot pain.  Pt states her left hand is numb.

## 2019-12-26 NOTE — ED Provider Notes (Signed)
[place]   MRN: [id number] DOB: [birth year]  Subjective:   Kayla Bean is a 64 y.o. female presenting for suffering an accidental fall yesterday.  Patient states she accidentally tripped over a concrete stop when she was getting her car inspected.  She states that her foot bent awkwardly and she fell landing on her left wrist, hand and forearm.  Denies head injury, loss of consciousness.  States that the pain is worsening severe, worst over the left wrist and hand.  Has had numbness of her hand and shooting pain from her wrist that goes up her forearm.  She took naproxen last night but nothing today.  Patient is not diabetic, denies confusion, dizziness, chest pain.  No current facility-administered medications for this encounter.  Current Outpatient Medications:  .  Accu-Chek FastClix Lancets MISC, Use as directed, Disp: 100 each, Rfl: 2 .  Albuterol Sulfate (PROAIR RESPICLICK) 010 (90 Base) MCG/ACT AEPB, Inhale 2 puffs into the lungs every 6 (six) hours as needed (for breathing)., Disp: 1 each, Rfl: 12 .  amLODipine (NORVASC) 10 MG tablet, Take 1 tablet (10 mg total) by mouth daily., Disp: 90 tablet, Rfl: 2 .  aspirin 81 MG tablet, Take 1 tablet (81 mg total) daily by mouth., Disp: 90 tablet, Rfl: 0 .  Blood Glucose Monitoring Suppl (ACCU-CHEK GUIDE ME) w/Device KIT, 1 Device by Does not apply route daily., Disp: 1 kit, Rfl: 0 .  brimonidine-timolol (COMBIGAN) 0.2-0.5 % ophthalmic solution, Place 1 drop into both eyes every 12 (twelve) hours., Disp: , Rfl:  .  cycloSPORINE (RESTASIS) 0.05 % ophthalmic emulsion, Place 1 drop into both eyes 2 (two) times daily., Disp: , Rfl:  .  fluticasone (FLONASE) 50 MCG/ACT nasal spray, Place 2 sprays into both nostrils daily., Disp: 16 g, Rfl: 0 .  glipiZIDE (GLUCOTROL) 10 MG tablet, Take 1 tablet (10 mg total) by mouth 2 (two) times daily before a meal., Disp: 180 tablet, Rfl: 2 .  glucose blood (ACCU-CHEK GUIDE) test strip, Use as  instructed, Disp: 100 each, Rfl: 12 .  glucose blood (TRUE METRIX BLOOD GLUCOSE TEST) test strip, Use as instructed to check blood sugar 3 times daily. E11.9, Disp: 100 each, Rfl: 12 .  Lancet Devices (ACCU-CHEK SOFTCLIX) lancets, Use as instructed for 3 times daily testing of blood glucose, Disp: 1 each, Rfl: 0 .  Lancets Misc. (ACCU-CHEK FASTCLIX LANCET) KIT, Use as directed, Disp: 1 kit, Rfl: 12 .  latanoprost (XALATAN) 0.005 % ophthalmic solution, Place 1 drop into both eyes at bedtime. , Disp: , Rfl:  .  levothyroxine (SYNTHROID) 100 MCG tablet, Take 1 tablet (100 mcg total) by mouth daily., Disp: 30 tablet, Rfl: 3 .  lidocaine (LIDODERM) 5 %, Place 1 patch onto the skin daily. Remove & Discard patch within 12 hours or as directed by MD, Disp: 30 patch, Rfl: 0 .  lisinopril (ZESTRIL) 20 MG tablet, Take 1 tablet (20 mg total) by mouth daily., Disp: 90 tablet, Rfl: 2 .  methocarbamol (ROBAXIN) 500 MG tablet, Take 1 tablet (500 mg total) by mouth 2 (two) times daily., Disp: 20 tablet, Rfl: 0 .  naproxen (NAPROSYN) 500 MG tablet, Take 1 tablet (500 mg total) by mouth 2 (two) times daily., Disp: 30 tablet, Rfl: 0 .  polyethylene glycol (MIRALAX / GLYCOLAX) 17 g packet, Take 17 g by mouth daily as needed for mild constipation., Disp: 14 each, Rfl: 5 .  pravastatin (PRAVACHOL) 40 MG tablet, TAKE 1 TABLET BY MOUTH ONCE DAILY (  DOSE  INCREASE), Disp: 90 tablet, Rfl: 2 .  sitaGLIPtin (JANUVIA) 100 MG tablet, Take 1 tablet (100 mg total) by mouth daily., Disp: 90 tablet, Rfl: 2 .  TRUEPLUS LANCETS 28G MISC, Use as directed, Disp: 100 each, Rfl: 1   Allergies  Allergen Reactions  . Latex Hives  . Metformin And Related     Hair loss  . Penicillins Hives  . Warfarin Sodium Hives    REACTION: Got rash when took one pill of coumadin  related to knee replacement surgery in 2007.  . Iodinated Diagnostic Agents Other (See Comments)    PT STATES SHE HAD AN ALLERGIC REACTION OF BLISTERS ON HANDS AND FEET 2  DAYS AFTER CT SCAN W/ IV CONTRAST INJECTION, DR. MANSELL SUGGESTS 13 HR PREP//A.C.    Past Medical History:  Diagnosis Date  . Arthritis   . Asthma   . Diabetes mellitus   . Heart murmur   . Hemorrhoids   . Hypertension   . Thyroid disease    hypothyroidism     Past Surgical History:  Procedure Laterality Date  . ABDOMINAL HYSTERECTOMY    . CHOLECYSTECTOMY N/A 02/24/2019   Procedure: LAPAROSCOPIC CHOLECYSTECTOMY WITH INTRAOPERATIVE CHOLANGIOGRAM;  Surgeon: Donnie Mesa, MD;  Location: [facility];  Service: General;  Laterality: N/A;  . KNEE SURGERY     left  . THYROIDECTOMY    . TOTAL KNEE ARTHROPLASTY     right knee    Family History  Problem Relation Age of Onset  . Asthma Other   . Cancer Other   . Hypertension Other   . Diabetes Other     Social History   Tobacco Use  . Smoking status: Former Smoker    Quit date: 09/18/2001    Years since quitting: 18.2  . Smokeless tobacco: Never Used  Substance Use Topics  . Alcohol use: Yes  . Drug use: No    ROS   Objective:   Vitals: BP 101/74 (BP Location: Right Arm)   Pulse 84   Temp 98.6 F (37 C) (Oral)   Resp 20   SpO2 97%   Physical Exam Constitutional:      General: She is not in acute distress.    Appearance: Normal appearance. She is well-developed. She is not ill-appearing, toxic-appearing or diaphoretic.  HENT:     Head: Normocephalic and atraumatic.     Nose: Nose normal.     Mouth/Throat:     Mouth: Mucous membranes are moist.  Eyes:     Extraocular Movements: Extraocular movements intact.     Pupils: Pupils are equal, round, and reactive to light.  Cardiovascular:     Rate and Rhythm: Normal rate and regular rhythm.     Pulses: Normal pulses.     Heart sounds: Normal heart sounds. No murmur. No friction rub. No gallop.   Pulmonary:     Effort: Pulmonary effort is normal. No respiratory distress.     Breath sounds: Normal breath sounds. No stridor. No wheezing, rhonchi or rales.    Musculoskeletal:     Left upper arm: No swelling, edema, deformity, lacerations, tenderness or bony tenderness.     Left elbow: No swelling, deformity, effusion or lacerations. Normal range of motion. No tenderness.     Left forearm: Tenderness present. No swelling, edema, deformity, lacerations or bony tenderness.     Left wrist: Swelling (dorsal aspect of wrist), deformity, tenderness, bony tenderness and snuff box tenderness present. No effusion or lacerations. Decreased range of motion.  Left hand: Swelling, deformity (dorsal aspect of left hand), tenderness (up to MCPs) and bony tenderness present. No lacerations. Decreased range of motion. Decreased strength. Decreased sensation. There is no disruption of two-point discrimination. Normal capillary refill.     Left foot: Normal range of motion and normal capillary refill. Tenderness (left great toe) and bony tenderness (left great toe, 1st mtp) present. No swelling, deformity, laceration or crepitus.  Skin:    General: Skin is warm and dry.     Findings: No rash.  Neurological:     Mental Status: She is alert and oriented to person, place, and time.  Psychiatric:        Mood and Affect: Mood normal.        Behavior: Behavior normal.        Thought Content: Thought content normal.        Judgment: Judgment normal.    DG Wrist Complete Left  Result Date: 12/26/2019 CLINICAL DATA:  Trip and fall yesterday with wrist pain, initial encounter EXAM: LEFT WRIST - COMPLETE 3+ VIEW COMPARISON:  None. FINDINGS: No acute fracture or dislocation is noted. Changes consistent with prior ulnar styloid fracture with nonunion are seen. Degenerative changes of the first [facility] joint are noted. No soft tissue abnormality is seen. IMPRESSION: Chronic changes without acute abnormality. Electronically Signed   By: Inez Catalina M.D.   On: 12/26/2019 11:00   DG Foot Complete Left  Result Date: 12/26/2019 CLINICAL DATA:  Pain following fall EXAM: LEFT FOOT -  COMPLETE 3+ VIEW COMPARISON:  None. FINDINGS: Frontal, oblique, and lateral views obtained. No fracture or dislocation. There is mild narrowing of the first MTP joint. There is mild bony overgrowth of the distal first metatarsal with slight bunion formation. Other joint spaces appear normal. No erosive change. There are small posterior and inferior calcaneal spurs. IMPRESSION: No fracture or dislocation. Mild narrowing first MTP joint with mild bony overgrowth and early bunion formation at the first MTP joint level. Joint spaces otherwise unremarkable. Small calcaneal spurs noted. Electronically Signed   By: Lowella Grip III M.D.   On: 12/26/2019 11:11   Assessment and Plan :   1. Pain and swelling of left wrist   2. Fall, initial encounter   3. Left hand pain   4. Left foot pain   5. Left forearm pain   6. Well controlled type 2 diabetes mellitus ([place])   7. Contusion of left wrist, initial encounter   8. Contusion of left hand, initial encounter   9. Contusion of left foot, initial encounter     Will manage conservatively for musculoskeletal type pain associated with the accidental fall.  Counseled on use of APAP, tramadol and modification of physical activity.  Anticipatory guidance provided.  Avoid NSAID use. Counseled patient on potential for adverse effects with medications prescribed/recommended today, ER and return-to-clinic precautions discussed, patient verbalized understanding.    Jaynee Eagles, PA-C 12/26/19 1131

## 2019-12-26 NOTE — Discharge Instructions (Addendum)
Please schedule Tylenol at 500 mg - 650 mg once every 6 hours as needed for aches and pains.  If you still have pain despite taking Tylenol regularly, this is breakthrough pain.  You can use tramadol once every 6 hours for this.  Once your pain is better controlled, switch back to just Tylenol.  

## 2020-01-07 ENCOUNTER — Ambulatory Visit: Payer: Medicare HMO | Attending: Internal Medicine

## 2020-01-07 DIAGNOSIS — Z23 Encounter for immunization: Secondary | ICD-10-CM

## 2020-01-07 NOTE — Progress Notes (Signed)
   Covid-19 Vaccination Clinic  Name:  Kayla Bean    MRN: [id number] DOB: 1956/04/09  01/07/2020  Kayla Bean was observed post Covid-19 immunization for 15 minutes without incident. She was provided with Vaccine Information Sheet and instruction to access the V-Safe system.   Kayla Bean was instructed to call 911 with any severe reactions post vaccine: Marland Kitchen Difficulty breathing  . Swelling of face and throat  . A fast heartbeat  . A bad rash all over body  . Dizziness and weakness   Immunizations Administered    Name Date Dose VIS Date Route   Pfizer COVID-19 Vaccine 01/07/2020  1:11 PM 0.3 mL 11/12/2018 Intramuscular   Manufacturer: [place]   Lot: [id number]   [place]: [id number]

## 2020-01-28 DIAGNOSIS — M1712 Unilateral primary osteoarthritis, left knee: Secondary | ICD-10-CM | POA: Diagnosis not present

## 2020-01-28 DIAGNOSIS — M25562 Pain in left knee: Secondary | ICD-10-CM | POA: Diagnosis not present

## 2020-02-08 ENCOUNTER — Other Ambulatory Visit: Payer: Self-pay | Admitting: Internal Medicine

## 2020-02-08 DIAGNOSIS — I10 Essential (primary) hypertension: Secondary | ICD-10-CM

## 2020-03-08 ENCOUNTER — Other Ambulatory Visit: Payer: Self-pay | Admitting: Internal Medicine

## 2020-03-08 DIAGNOSIS — I10 Essential (primary) hypertension: Secondary | ICD-10-CM

## 2020-03-09 DIAGNOSIS — Z961 Presence of intraocular lens: Secondary | ICD-10-CM | POA: Diagnosis not present

## 2020-03-09 DIAGNOSIS — H401122 Primary open-angle glaucoma, left eye, moderate stage: Secondary | ICD-10-CM | POA: Diagnosis not present

## 2020-03-09 DIAGNOSIS — H0288B Meibomian gland dysfunction left eye, upper and lower eyelids: Secondary | ICD-10-CM | POA: Diagnosis not present

## 2020-03-09 DIAGNOSIS — H401111 Primary open-angle glaucoma, right eye, mild stage: Secondary | ICD-10-CM | POA: Diagnosis not present

## 2020-03-09 DIAGNOSIS — H16223 Keratoconjunctivitis sicca, not specified as Sjogren's, bilateral: Secondary | ICD-10-CM | POA: Diagnosis not present

## 2020-03-09 DIAGNOSIS — H0288A Meibomian gland dysfunction right eye, upper and lower eyelids: Secondary | ICD-10-CM | POA: Diagnosis not present

## 2020-03-29 ENCOUNTER — Other Ambulatory Visit: Payer: Self-pay | Admitting: Internal Medicine

## 2020-03-29 DIAGNOSIS — E890 Postprocedural hypothyroidism: Secondary | ICD-10-CM

## 2020-04-27 ENCOUNTER — Other Ambulatory Visit: Payer: Self-pay | Admitting: Internal Medicine

## 2020-04-27 DIAGNOSIS — E890 Postprocedural hypothyroidism: Secondary | ICD-10-CM

## 2020-04-27 NOTE — Telephone Encounter (Signed)
Requested medications are due for refill today?  Yes  Requested medications are on active medication list?  Yes  Last Refill:   03/29/2020  # 30 with no refills.  Courtesy Refill with notation patient must have office visit for refills.    Future visit scheduled? No  Notes to Clinic:  Medication failed RX refill protocol due to no labs within 3 months of an abnormal result.

## 2020-05-05 DIAGNOSIS — M1712 Unilateral primary osteoarthritis, left knee: Secondary | ICD-10-CM | POA: Diagnosis not present

## 2020-05-06 ENCOUNTER — Other Ambulatory Visit: Payer: Self-pay | Admitting: Internal Medicine

## 2020-05-06 DIAGNOSIS — E890 Postprocedural hypothyroidism: Secondary | ICD-10-CM

## 2020-05-10 ENCOUNTER — Telehealth: Payer: Self-pay | Admitting: Internal Medicine

## 2020-05-10 DIAGNOSIS — E1165 Type 2 diabetes mellitus with hyperglycemia: Secondary | ICD-10-CM | POA: Diagnosis not present

## 2020-05-10 DIAGNOSIS — E890 Postprocedural hypothyroidism: Secondary | ICD-10-CM

## 2020-05-10 DIAGNOSIS — I10 Essential (primary) hypertension: Secondary | ICD-10-CM | POA: Diagnosis not present

## 2020-05-10 DIAGNOSIS — Z86010 Personal history of colonic polyps: Secondary | ICD-10-CM | POA: Diagnosis not present

## 2020-05-10 MED ORDER — LEVOTHYROXINE SODIUM 100 MCG PO TABS: 100.0000 ug | ORAL_TABLET | Freq: Every day | ORAL | 1 refills | Status: DC

## 2020-05-10 NOTE — Telephone Encounter (Signed)
levothyroxine (EUTHYROX) 100 MCG tablet Medication Date: 03/29/2020 Department: [facility] Ordering/Authorizing: Ladell Pier,    Pt called in requesting this med and has made an appt for the earliest appt available. Appt is still for 10/21 but pt is out  [facility] ([place]), [place] - [address] Phone:  [phone]  Fax:  [phone]     Call pt if needed

## 2020-05-10 NOTE — Telephone Encounter (Signed)
Will forward to pcp

## 2020-06-03 DIAGNOSIS — Z86010 Personal history of colonic polyps: Secondary | ICD-10-CM | POA: Diagnosis not present

## 2020-06-03 DIAGNOSIS — K5730 Diverticulosis of large intestine without perforation or abscess without bleeding: Secondary | ICD-10-CM | POA: Diagnosis not present

## 2020-06-03 DIAGNOSIS — D123 Benign neoplasm of transverse colon: Secondary | ICD-10-CM | POA: Diagnosis not present

## 2020-06-03 DIAGNOSIS — K635 Polyp of colon: Secondary | ICD-10-CM | POA: Diagnosis not present

## 2020-06-18 ENCOUNTER — Other Ambulatory Visit: Payer: Self-pay | Admitting: Internal Medicine

## 2020-06-18 DIAGNOSIS — Z1231 Encounter for screening mammogram for malignant neoplasm of breast: Secondary | ICD-10-CM

## 2020-07-08 ENCOUNTER — Encounter: Payer: Self-pay | Admitting: Internal Medicine

## 2020-07-08 ENCOUNTER — Other Ambulatory Visit: Payer: Self-pay

## 2020-07-08 ENCOUNTER — Ambulatory Visit: Payer: Medicare HMO | Attending: Internal Medicine | Admitting: Internal Medicine

## 2020-07-08 DIAGNOSIS — E890 Postprocedural hypothyroidism: Secondary | ICD-10-CM | POA: Diagnosis not present

## 2020-07-08 DIAGNOSIS — E785 Hyperlipidemia, unspecified: Secondary | ICD-10-CM

## 2020-07-08 DIAGNOSIS — I10 Essential (primary) hypertension: Secondary | ICD-10-CM

## 2020-07-08 DIAGNOSIS — L659 Nonscarring hair loss, unspecified: Secondary | ICD-10-CM | POA: Diagnosis not present

## 2020-07-08 DIAGNOSIS — M1712 Unilateral primary osteoarthritis, left knee: Secondary | ICD-10-CM

## 2020-07-08 DIAGNOSIS — E1169 Type 2 diabetes mellitus with other specified complication: Secondary | ICD-10-CM

## 2020-07-08 DIAGNOSIS — E119 Type 2 diabetes mellitus without complications: Secondary | ICD-10-CM | POA: Insufficient documentation

## 2020-07-08 DIAGNOSIS — R6889 Other general symptoms and signs: Secondary | ICD-10-CM | POA: Diagnosis not present

## 2020-07-08 LAB — GLUCOSE, POCT (MANUAL RESULT ENTRY): POC Glucose: 163 mg/dl — AB (ref 70–99)

## 2020-07-08 LAB — POCT GLYCOSYLATED HEMOGLOBIN (HGB A1C): Hemoglobin A1C: 6.9 % — AB (ref 4.0–5.6)

## 2020-07-08 MED ORDER — PRAVASTATIN SODIUM 40 MG PO TABS: ORAL_TABLET | ORAL | 1 refills | Status: DC

## 2020-07-08 MED ORDER — GLIPIZIDE 10 MG PO TABS: 10.0000 mg | ORAL_TABLET | Freq: Two times a day (BID) | ORAL | 1 refills | Status: DC

## 2020-07-08 MED ORDER — LEVOTHYROXINE SODIUM 100 MCG PO TABS: 100.0000 ug | ORAL_TABLET | Freq: Every day | ORAL | 1 refills | Status: DC

## 2020-07-08 MED ORDER — SITAGLIPTIN PHOSPHATE 100 MG PO TABS: 100.0000 mg | ORAL_TABLET | Freq: Every day | ORAL | 1 refills | Status: DC

## 2020-07-08 MED ORDER — LISINOPRIL 20 MG PO TABS: 20.0000 mg | ORAL_TABLET | Freq: Every day | ORAL | 1 refills | Status: DC

## 2020-07-08 NOTE — Progress Notes (Signed)
Patient ID: Kayla Bean, female    DOB: 06/02/1956  MRN: [id number]  CC: Medication Refill (Pt. is here for medication refill. )   Subjective: Kayla Bean is a 64 y.o. female who presents for chronic ds management Her concerns today include:  Patient with history of diabetes, hypertension,HL,post thyroidectomy hypothyroidism (1973, noncancerous), asthma, CKD stage III, and obesity.  Has a bald spot center of scalp x 30 yrs. No pattern baldness in family Natural hair x 5 yrs.  No weave or braids in hair for over 5 yrs.  DIABETES TYPE 2 Last A1C:   Results for orders placed or performed in visit on 07/08/20  Glucose (CBG)  Result Value Ref Range   POC Glucose 163 (A) 70 - 99 mg/dl  HgB A1c  Result Value Ref Range   Hemoglobin A1C 6.9 (A) 4.0 - 5.6 %   HbA1c POC (<> result, manual entry)     HbA1c, POC (prediabetic range)     HbA1c, POC (controlled diabetic range)      Med Adherence:  [x]  Yes  -Glipizide and Januvia Medication side effects:  []  Yes    [x]  No Home Monitoring?  [x]  Yes - once a wk     Home glucose results range: 120s Diet Adherence: [x]  Yes -more salads, eats fried chicken once a mth.  Exercise: []  Yes    [x]  No because LT knee "gives me a fit."  Had arthroscopic surgery in past.  Has DJD.  Gets steroid shot Q 3 mth. Takes Tylenol about once a mth.  Told she needs TKR but needs to lose 25 lbs.  Down 6 lbs since Feb 2021 Hypoglycemic episodes?: []  Yes    [x]  No Numbness of the feet? []  Yes    [x]  No Retinopathy hx? []  Yes    [x]  No Last eye exam: 03/09/2020.  Has glaucoma BL Comments:   HYPERTENSION Currently taking: see medication list.  She is on amlodipine and lisinopril Med Adherence: [x]  Yes    []  No Medication side effects: []  Yes    [x]  No Adherence with salt restriction: [x]  Yes    []  No Home Monitoring?: [x]  Yes    []  No Monitoring Frequency: once a wk Home BP results range:  126/75 this a.m SOB? []  Yes    [x]  No Chest Pain?: []   Yes    [x]  No Leg swelling?: []  Yes    [x]  No Headaches?: []  Yes    [x]  No Dizziness? []  Yes    [x]  No Comments:   HL:  Taking and tolerating Pravachol  Thyroid: Reports compliance with levothyroxine 100 mcg daily.  Denies any palpitations or feeling of being hot or cold all the time. Patient Active Problem List   Diagnosis Date Noted  . Class 3 severe obesity with serious comorbidity and body mass index (BMI) of 45.0 to 49.9 in adult ([place]) 10/01/2018  . Moderate persistent asthma without complication 33/29/5188  . Hyperlipidemia 03/22/2017  . Cholelithiasis 12/24/2012  . LIPOMA 07/26/2010  . SEBACEOUS CYST 11/19/2009  . CONSTIPATION, CHRONIC 01/18/2009  . EUSTACHIAN TUBE DYSFUNCTION, RIGHT 10/16/2008  . LOM 09/01/2008  . Essential hypertension 08/18/2008  . Osteoarthritis of left knee 04/23/2008  . PLANTAR FASCIITIS, RIGHT 04/23/2008  . Hypothyroidism 10/15/2007     Current Outpatient Medications on File Prior to Visit  Medication Sig Dispense Refill  . Accu-Chek FastClix Lancets MISC Use as directed 100 each 2  . Albuterol Sulfate (PROAIR RESPICLICK) 416 (90 Base) MCG/ACT AEPB  Inhale 2 puffs into the lungs every 6 (six) hours as needed (for breathing). 1 each 12  . amLODipine (NORVASC) 10 MG tablet Take 1 tablet (10 mg total) by mouth daily. 90 tablet 2  . aspirin 81 MG tablet Take 1 tablet (81 mg total) daily by mouth. 90 tablet 0  . Blood Glucose Monitoring Suppl (ACCU-CHEK GUIDE ME) w/Device KIT 1 Device by Does not apply route daily. 1 kit 0  . brimonidine-timolol (COMBIGAN) 0.2-0.5 % ophthalmic solution Place 1 drop into both eyes every 12 (twelve) hours.    . cycloSPORINE (RESTASIS) 0.05 % ophthalmic emulsion Place 1 drop into both eyes 2 (two) times daily.    . fluticasone (FLONASE) 50 MCG/ACT nasal spray Place 2 sprays into both nostrils daily. 16 g 0  . glipiZIDE (GLUCOTROL) 10 MG tablet Take 1 tablet (10 mg total) by mouth 2 (two) times daily before a meal. 180 tablet  2  . glucose blood (ACCU-CHEK GUIDE) test strip Use as instructed 100 each 12  . glucose blood (TRUE METRIX BLOOD GLUCOSE TEST) test strip Use as instructed to check blood sugar 3 times daily. E11.9 100 each 12  . Lancet Devices (ACCU-CHEK SOFTCLIX) lancets Use as instructed for 3 times daily testing of blood glucose 1 each 0  . Lancets Misc. (ACCU-CHEK FASTCLIX LANCET) KIT Use as directed 1 kit 12  . latanoprost (XALATAN) 0.005 % ophthalmic solution Place 1 drop into both eyes at bedtime.     Marland Kitchen levothyroxine (EUTHYROX) 100 MCG tablet Take 1 tablet (100 mcg total) by mouth daily. Please make PCP appointment. 30 tablet 1  . lidocaine (LIDODERM) 5 % Place 1 patch onto the skin daily. Remove & Discard patch within 12 hours or as directed by MD 30 patch 0  . lisinopril (ZESTRIL) 20 MG tablet Take 1 tablet by mouth daily 90 tablet 0  . methocarbamol (ROBAXIN) 500 MG tablet Take 1 tablet (500 mg total) by mouth 2 (two) times daily. 20 tablet 0  . naproxen (NAPROSYN) 500 MG tablet Take 1 tablet (500 mg total) by mouth 2 (two) times daily. 30 tablet 0  . polyethylene glycol (MIRALAX / GLYCOLAX) 17 g packet Take 17 g by mouth daily as needed for mild constipation. 14 each 5  . pravastatin (PRAVACHOL) 40 MG tablet TAKE 1 TABLET BY MOUTH ONCE DAILY (DOSE  INCREASE) 90 tablet 2  . sitaGLIPtin (JANUVIA) 100 MG tablet Take 1 tablet (100 mg total) by mouth daily. 90 tablet 2  . traMADol (ULTRAM) 50 MG tablet Take 1 tablet (50 mg total) by mouth every 6 (six) hours as needed. 20 tablet 0  . TRUEPLUS LANCETS 28G MISC Use as directed 100 each 1   No current facility-administered medications on file prior to visit.    Allergies  Allergen Reactions  . Latex Hives  . Metformin And Related     Hair loss  . Penicillins Hives  . Warfarin Sodium Hives    REACTION: Got rash when took one pill of coumadin  related to knee replacement surgery in 2007.  . Iodinated Diagnostic Agents Other (See Comments)    PT STATES  SHE HAD AN ALLERGIC REACTION OF BLISTERS ON HANDS AND FEET 2 DAYS AFTER CT SCAN W/ IV CONTRAST INJECTION, DR. MANSELL SUGGESTS 13 HR PREP//A.C.    Social History   Socioeconomic History  . Marital status: Single    Spouse name: Not on file  . Number of children: Not on file  . Years of education:  Not on file  . Highest education level: Not on file  Occupational History  . Not on file  Tobacco Use  . Smoking status: Former Smoker    Quit date: 09/18/2001    Years since quitting: 18.8  . Smokeless tobacco: Never Used  Substance and Sexual Activity  . Alcohol use: Yes  . Drug use: No  . Sexual activity: Not Currently  Other Topics Concern  . Not on file  Social History Narrative  . Not on file   Social Determinants of Health   Financial Resource Strain:   . Difficulty of Paying Living Expenses: Not on file  Food Insecurity:   . Worried About Charity fundraiser in the Last Year: Not on file  . Ran Out of Food in the Last Year: Not on file  Transportation Needs:   . Lack of Transportation (Medical): Not on file  . Lack of Transportation (Non-Medical): Not on file  Physical Activity:   . Days of Exercise per Week: Not on file  . Minutes of Exercise per Session: Not on file  Stress:   . Feeling of Stress : Not on file  Social Connections:   . Frequency of Communication with Friends and Family: Not on file  . Frequency of Social Gatherings with Friends and Family: Not on file  . Attends Religious Services: Not on file  . Active Member of Clubs or Organizations: Not on file  . Attends Archivist Meetings: Not on file  . Marital Status: Not on file  Intimate Partner Violence:   . Fear of Current or Ex-Partner: Not on file  . Emotionally Abused: Not on file  . Physically Abused: Not on file  . Sexually Abused: Not on file    Family History  Problem Relation Age of Onset  . Asthma Other   . Cancer Other   . Hypertension Other   . Diabetes Other     Past  Surgical History:  Procedure Laterality Date  . ABDOMINAL HYSTERECTOMY    . CHOLECYSTECTOMY N/A 02/24/2019   Procedure: LAPAROSCOPIC CHOLECYSTECTOMY WITH INTRAOPERATIVE CHOLANGIOGRAM;  Surgeon: Donnie Mesa, MD;  Location: [facility];  Service: General;  Laterality: N/A;  . KNEE SURGERY     left  . THYROIDECTOMY    . TOTAL KNEE ARTHROPLASTY     right knee    ROS: Review of Systems Negative except as stated above  PHYSICAL EXAM: BP 117/66 (BP Location: Right Arm, Patient Position: Sitting, Cuff Size: Large)   Pulse 61   Temp 97.7 F (36.5 C) (Temporal)   Ht $R'5\' 2"'At$  (1.575 m)   Wt 248 lb (112.5 kg)   SpO2 97%   BMI 45.36 kg/m   Wt Readings from Last 3 Encounters:  07/08/20 248 lb (112.5 kg)  10/22/19 254 lb (115.2 kg)  04/24/19 247 lb 12.8 oz (112.4 kg)    Physical Exam  General appearance - alert, well appearing, and in no distress Mental status - normal mood, behavior, speech, dress, motor activity, and thought processes Neck - supple, no significant adenopathy Chest - clear to auscultation, no wheezes, rales or rhonchi, symmetric air entry Heart - normal rate, regular rhythm, normal S1, S2, no murmurs, rubs, clicks or gallops Extremities - peripheral pulses normal, no pedal edema, no clubbing or cyanosis   CMP Latest Ref Rng & Units 10/22/2019 02/25/2019 02/24/2019  Glucose 70 - 99 mg/dL 131(H) 244(H) 187(H)  BUN 8 - 23 mg/dL $Remove'13 12 14  'xrxPzyl$ Creatinine 0.44 - 1.00 mg/dL  1.09(H) 1.23(H) 1.24(H)  Sodium 135 - 145 mmol/L 142 135 136  Potassium 3.5 - 5.1 mmol/L 3.9 4.1 5.2(H)  Chloride 98 - 111 mmol/L 109 103 102  CO2 22 - 32 mmol/L $RemoveB'24 23 24  'dwJHQURh$ Calcium 8.9 - 10.3 mg/dL 9.1 8.6(L) 9.0  Total Protein 6.5 - 8.1 g/dL 7.7 7.1 8.0  Total Bilirubin 0.3 - 1.2 mg/dL 0.6 0.6 1.8(H)  Alkaline Phos 38 - 126 U/L 87 65 106  AST 15 - 41 U/L 16 53(H) 35  ALT 0 - 44 U/L 17 41 13   Lipid Panel     Component Value Date/Time   CHOL 154 04/24/2019 1412   TRIG 63 04/24/2019 1412   HDL 44 04/24/2019  1412   CHOLHDL 3.5 04/24/2019 1412   CHOLHDL 3.6 03/14/2016 1238   VLDL 12 03/14/2016 1238   LDLCALC 97 04/24/2019 1412    CBC    Component Value Date/Time   WBC 9.5 10/22/2019 1113   RBC 4.25 10/22/2019 1113   HGB 13.1 10/22/2019 1113   HGB 15.8 03/08/2018 1649   HCT 39.1 10/22/2019 1113   HCT 44.5 03/08/2018 1649   PLT 221 10/22/2019 1113   PLT 280 03/08/2018 1649   MCV 92.0 10/22/2019 1113   MCV 87 03/08/2018 1649   MCH 30.8 10/22/2019 1113   MCHC 33.5 10/22/2019 1113   RDW 12.5 10/22/2019 1113   RDW 14.2 03/08/2018 1649   LYMPHSABS 1.8 10/22/2019 1113   MONOABS 0.4 10/22/2019 1113   EOSABS 0.4 10/22/2019 1113   BASOSABS 0.1 10/22/2019 1113    ASSESSMENT AND PLAN: 1. Type 2 diabetes mellitus with morbid obesity ([facility] [place]) Commended her on weight loss and getting her A1c down.  Encourage her to continue healthy eating habits.  Encouraged her to try to move more.  Continue Glucotrol and Januvia. - Glucose (CBG) - Microalbumin/Creatinine Ratio, Urine - HgB A1c - Comprehensive metabolic panel - glipiZIDE (GLUCOTROL) 10 MG tablet; Take 1 tablet (10 mg total) by mouth 2 (two) times daily before a meal.  Dispense: 180 tablet; Refill: 1 - sitaGLIPtin (JANUVIA) 100 MG tablet; Take 1 tablet (100 mg total) by mouth daily.  Dispense: 90 tablet; Refill: 1  2. Essential hypertension At goal.  Continue current medication - lisinopril (ZESTRIL) 20 MG tablet; Take 1 tablet (20 mg total) by mouth daily.  Dispense: 90 tablet; Refill: 1  3. Hyperlipidemia associated with type 2 diabetes mellitus ([facility]) - Lipid panel - pravastatin (PRAVACHOL) 40 MG tablet; TAKE 1 TABLET BY MOUTH ONCE DAILY (DOSE  INCREASE)  Dispense: 90 tablet; Refill: 1  4. Postoperative hypothyroidism - TSH - levothyroxine (EUTHYROX) 100 MCG tablet; Take 1 tablet (100 mcg total) by mouth daily. Please make PCP appointment.  Dispense: 90 tablet; Refill: 1  5. Alopecia Patient had her hair up in a ponytail and did not  want to disturb it for me to have a look.  However I told her that she can try Rogaine for Women which can be purchased over-the-counter and if no success with that we can refer her to dermatology.   6.  Osteoarthritis left knee Recommend taking 2 Tylenols about 30 minutes before she goes out to walk.  Continue to work on weight loss so that she can get total knee replacement in the future.  Patient was given the opportunity to ask questions.  Patient verbalized understanding of the plan and was able to repeat key elements of the plan.   Orders Placed This Encounter  Procedures  .  Microalbumin/Creatinine Ratio, Urine  . Glucose (CBG)  . HgB A1c     Requested Prescriptions    No prescriptions requested or ordered in this encounter    No follow-ups on file.  Karle Plumber, MD, FACP

## 2020-07-08 NOTE — Patient Instructions (Signed)
Diabetes Mellitus and Exercise Exercising regularly is important for your overall health, especially when you have diabetes (diabetes mellitus). Exercising is not only about losing weight. It has many other health benefits, such as increasing muscle strength and bone density and reducing body fat and stress. This leads to improved fitness, flexibility, and endurance, all of which result in better overall health. Exercise has additional benefits for people with diabetes, including:  Reducing appetite.  Helping to lower and control blood glucose.  Lowering blood pressure.  Helping to control amounts of fatty substances (lipids) in the blood, such as cholesterol and triglycerides.  Helping the body to respond better to insulin (improving insulin sensitivity).  Reducing how much insulin the body needs.  Decreasing the risk for heart disease by: ? Lowering cholesterol and triglyceride levels. ? Increasing the levels of good cholesterol. ? Lowering blood glucose levels. What is my activity plan? Your health care provider or certified diabetes educator can help you make a plan for the type and frequency of exercise (activity plan) that works for you. Make sure that you:  Do at least 150 minutes of moderate-intensity or vigorous-intensity exercise each week. This could be brisk walking, biking, or water aerobics. ? Do stretching and strength exercises, such as yoga or weightlifting, at least 2 times a week. ? Spread out your activity over at least 3 days of the week.  Get some form of physical activity every day. ? Do not go more than 2 days in a row without some kind of physical activity. ? Avoid being inactive for more than 30 minutes at a time. Take frequent breaks to walk or stretch.  Choose a type of exercise or activity that you enjoy, and set realistic goals.  Start slowly, and gradually increase the intensity of your exercise over time. What do I need to know about managing my  diabetes?   Check your blood glucose before and after exercising. ? If your blood glucose is 240 mg/dL (13.3 mmol/L) or higher before you exercise, check your urine for ketones. If you have ketones in your urine, do not exercise until your blood glucose returns to normal. ? If your blood glucose is 100 mg/dL (5.6 mmol/L) or lower, eat a snack containing 15-20 grams of carbohydrate. Check your blood glucose 15 minutes after the snack to make sure that your level is above 100 mg/dL (5.6 mmol/L) before you start your exercise.  Know the symptoms of low blood glucose (hypoglycemia) and how to treat it. Your risk for hypoglycemia increases during and after exercise. Common symptoms of hypoglycemia can include: ? Hunger. ? Anxiety. ? Sweating and feeling clammy. ? Confusion. ? Dizziness or feeling light-headed. ? Increased heart rate or palpitations. ? Blurry vision. ? Tingling or numbness around the mouth, lips, or tongue. ? Tremors or shakes. ? Irritability.  Keep a rapid-acting carbohydrate snack available before, during, and after exercise to help prevent or treat hypoglycemia.  Avoid injecting insulin into areas of the body that are going to be exercised. For example, avoid injecting insulin into: ? The arms, when playing tennis. ? The legs, when jogging.  Keep records of your exercise habits. Doing this can help you and your health care provider adjust your diabetes management plan as needed. Write down: ? Food that you eat before and after you exercise. ? Blood glucose levels before and after you exercise. ? The type and amount of exercise you have done. ? When your insulin is expected to peak, if you use   insulin. Avoid exercising at times when your insulin is peaking.  When you start a new exercise or activity, work with your health care provider to make sure the activity is safe for you, and to adjust your insulin, medicines, or food intake as needed.  Drink plenty of water while  you exercise to prevent dehydration or heat stroke. Drink enough fluid to keep your urine clear or pale yellow. Summary  Exercising regularly is important for your overall health, especially when you have diabetes (diabetes mellitus).  Exercising has many health benefits, such as increasing muscle strength and bone density and reducing body fat and stress.  Your health care provider or certified diabetes educator can help you make a plan for the type and frequency of exercise (activity plan) that works for you.  When you start a new exercise or activity, work with your health care provider to make sure the activity is safe for you, and to adjust your insulin, medicines, or food intake as needed. This information is not intended to replace advice given to you by your health care provider. Make sure you discuss any questions you have with your health care provider. Document Revised: 03/29/2017 Document Reviewed: 02/14/2016 Elsevier Patient Education  2020 Elsevier Inc.  

## 2020-07-09 LAB — COMPREHENSIVE METABOLIC PANEL
ALT: 13 IU/L (ref 0–32)
AST: 19 IU/L (ref 0–40)
Albumin/Globulin Ratio: 1 — ABNORMAL LOW (ref 1.2–2.2)
Albumin: 4.4 g/dL (ref 3.8–4.8)
Alkaline Phosphatase: 88 IU/L (ref 44–121)
BUN/Creatinine Ratio: 11 — ABNORMAL LOW (ref 12–28)
BUN: 11 mg/dL (ref 8–27)
Bilirubin Total: 0.5 mg/dL (ref 0.0–1.2)
CO2: 25 mmol/L (ref 20–29)
Calcium: 9.6 mg/dL (ref 8.7–10.3)
Chloride: 102 mmol/L (ref 96–106)
Creatinine, Ser: 1.01 mg/dL — ABNORMAL HIGH (ref 0.57–1.00)
GFR calc Af Amer: 67 mL/min/{1.73_m2} (ref >59)
GFR calc non Af Amer: 58 mL/min/{1.73_m2} — ABNORMAL LOW (ref >59)
Globulin, Total: 4.2 g/dL (ref 1.5–4.5)
Glucose: 111 mg/dL — ABNORMAL HIGH (ref 65–99)
Potassium: 4.3 mmol/L (ref 3.5–5.2)
Sodium: 140 mmol/L (ref 134–144)
Total Protein: 8.6 g/dL — ABNORMAL HIGH (ref 6.0–8.5)

## 2020-07-09 LAB — MICROALBUMIN / CREATININE URINE RATIO
Creatinine, Urine: 50.1 mg/dL
Microalb/Creat Ratio: 12 mg/g creat (ref 0–29)
Microalbumin, Urine: 6.1 ug/mL

## 2020-07-09 LAB — TSH: TSH: 1.17 u[IU]/mL (ref 0.450–4.500)

## 2020-07-09 LAB — LIPID PANEL
Chol/HDL Ratio: 3.2 ratio (ref 0.0–4.4)
Cholesterol, Total: 125 mg/dL (ref 100–199)
HDL: 39 mg/dL — ABNORMAL LOW (ref >39)
LDL Chol Calc (NIH): 68 mg/dL (ref 0–99)
Triglycerides: 96 mg/dL (ref 0–149)
VLDL Cholesterol Cal: 18 mg/dL (ref 5–40)

## 2020-07-09 NOTE — Progress Notes (Signed)
Let patient know that her kidney function is not 100% but improved compared to last year.  Liver function tests normal.  Cholesterol level at goal.  Thyroid level normal.

## 2020-07-22 ENCOUNTER — Ambulatory Visit
Admission: RE | Admit: 2020-07-22 | Discharge: 2020-07-22 | Disposition: A | Discharge status: Home / Self Care | Payer: Medicare HMO | Source: Ambulatory Visit | Attending: Internal Medicine | Admitting: Internal Medicine

## 2020-07-22 ENCOUNTER — Other Ambulatory Visit: Payer: Self-pay

## 2020-07-22 DIAGNOSIS — Z1231 Encounter for screening mammogram for malignant neoplasm of breast: Secondary | ICD-10-CM | POA: Diagnosis not present

## 2020-07-27 ENCOUNTER — Other Ambulatory Visit: Payer: Self-pay | Admitting: Internal Medicine

## 2020-07-27 DIAGNOSIS — J4540 Moderate persistent asthma, uncomplicated: Secondary | ICD-10-CM

## 2020-07-27 MED ORDER — PROAIR RESPICLICK 108 (90 BASE) MCG/ACT IN AEPB: 2.0000 | INHALATION_SPRAY | Freq: Four times a day (QID) | RESPIRATORY_TRACT | 2 refills | Status: DC | PRN

## 2020-07-27 NOTE — Telephone Encounter (Signed)
Medication Refill - Medication: pt called reporting that she is missing a Rx for   Albuterol Sulfate ([place]) 200 (90 Base) MCG/ACT AEPB   Has the patient contacted their pharmacy? Yes.   (Agent: If no, request that the patient contact the pharmacy for the refill.) (Agent: If yes, when and what did the pharmacy advise?)  Preferred Pharmacy (with phone number or street name):   [facility] ([place]), [place] - [address]  [address] [place] ([place]) [place] [address]  Phone: [phone] Fax: [phone]     Agent: Please be advised that RX refills may take up to 3 business days. We ask that you follow-up with your pharmacy.

## 2020-07-28 ENCOUNTER — Telehealth: Payer: Self-pay

## 2020-07-28 MED ORDER — ALBUTEROL SULFATE HFA 108 (90 BASE) MCG/ACT IN AERS: 2.0000 | INHALATION_SPRAY | Freq: Four times a day (QID) | RESPIRATORY_TRACT | 2 refills | Status: DC | PRN

## 2020-07-28 NOTE — Telephone Encounter (Signed)
Rx was sent for Proair RespiClick. This is not covered. Ventolin is covered. Rx sent for Ventolin.

## 2020-07-28 NOTE — Telephone Encounter (Signed)
Scheduled with Dr. Leanne Chang on Nov 18th.

## 2020-07-28 NOTE — Addendum Note (Signed)
Addended by: Daisy Blossom, [place] L on: 07/28/2020 12:12 PM   Modules accepted: Orders

## 2020-07-28 NOTE — Telephone Encounter (Signed)
Contacted pt to go over mm results lvm

## 2020-07-28 NOTE — Telephone Encounter (Signed)
Will forward to pcp

## 2020-07-28 NOTE — Telephone Encounter (Signed)
Pt called stating that she went to pick up this inhaler yesterday and they are wanting $76 for it. Pt is requesting to have an inhaler sent in that she can pick up for free. Pt also states that her hands have also been going numb Pt states that she is having no other stroke related symptoms and is requesting to let Dr. Wynetta Emery know. Please advise.

## 2020-07-28 NOTE — Telephone Encounter (Signed)
Please contact pt and see if she would like a appt to discuss the numbness in her hands

## 2020-08-05 ENCOUNTER — Ambulatory Visit: Payer: Medicare HMO | Attending: Internal Medicine | Admitting: Internal Medicine

## 2020-08-05 ENCOUNTER — Encounter: Payer: Self-pay | Admitting: Internal Medicine

## 2020-08-05 ENCOUNTER — Other Ambulatory Visit: Payer: Self-pay

## 2020-08-05 DIAGNOSIS — I10 Essential (primary) hypertension: Secondary | ICD-10-CM | POA: Diagnosis not present

## 2020-08-05 DIAGNOSIS — E66813 Obesity, class 3: Secondary | ICD-10-CM

## 2020-08-05 DIAGNOSIS — E119 Type 2 diabetes mellitus without complications: Secondary | ICD-10-CM | POA: Diagnosis not present

## 2020-08-05 DIAGNOSIS — E1169 Type 2 diabetes mellitus with other specified complication: Secondary | ICD-10-CM | POA: Diagnosis not present

## 2020-08-05 DIAGNOSIS — E7849 Other hyperlipidemia: Secondary | ICD-10-CM | POA: Diagnosis not present

## 2020-08-05 DIAGNOSIS — Z6841 Body Mass Index (BMI) 40.0 and over, adult: Secondary | ICD-10-CM

## 2020-08-05 LAB — GLUCOSE, POCT (MANUAL RESULT ENTRY): POC Glucose: 151 mg/dl — AB (ref 70–99)

## 2020-08-05 MED ORDER — GLIPIZIDE 10 MG PO TABS: 10.0000 mg | ORAL_TABLET | Freq: Two times a day (BID) | ORAL | 1 refills | Status: DC

## 2020-08-05 NOTE — Assessment & Plan Note (Signed)
  BP Readings from Last 3 Encounters:  08/05/20 117/70  07/08/20 117/66  12/26/19 101/74  bp well controlled Continue current meds

## 2020-08-05 NOTE — Assessment & Plan Note (Signed)
She understands need for weight loss-  Attempting modest calorie restriction

## 2020-08-05 NOTE — Assessment & Plan Note (Signed)
Well controlled. Continue current meds

## 2020-08-05 NOTE — Progress Notes (Signed)
Patient has taken medication today Patient has not eaten today. Patient denies pain at this time.  Patient here for DM follow up  Home cbgs in the low to mid 100's  She states that hands L>R can feel numb at times- she describes this as a pins and needles sensation.   Rare cough.  Past Medical History:  Diagnosis Date   Arthritis    Asthma    Diabetes mellitus    Heart murmur    Hemorrhoids    Hypertension    Thyroid disease    hypothyroidism    Social History   Socioeconomic History   Marital status: Single    Spouse name: Not on file   Number of children: Not on file   Years of education: Not on file   Highest education level: Not on file  Occupational History   Not on file  Tobacco Use   Smoking status: Former Smoker    Quit date: 09/18/2001    Years since quitting: 18.8   Smokeless tobacco: Never Used  Substance and Sexual Activity   Alcohol use: Yes   Drug use: No   Sexual activity: Not Currently  Other Topics Concern   Not on file  Social History Narrative   Not on file   Social Determinants of Health   Financial Resource Strain:    Difficulty of Paying Living Expenses: Not on file  Food Insecurity:    Worried About Programme researcher, broadcasting/film/video in the Last Year: Not on file   [employer] of Food in the Last Year: Not on file  Transportation Needs:    Lack of Transportation (Medical): Not on file   Lack of Transportation (Non-Medical): Not on file  Physical Activity:    Days of Exercise per Week: Not on file   Minutes of Exercise per Session: Not on file  Stress:    Feeling of Stress : Not on file  Social Connections:    Frequency of Communication with Friends and Family: Not on file   Frequency of Social Gatherings with Friends and Family: Not on file   Attends Religious Services: Not on file   Active Member of Clubs or Organizations: Not on file   Attends Banker Meetings: Not on file   Marital Status: Not on  file  Intimate Partner Violence:    Fear of Current or Ex-Partner: Not on file   Emotionally Abused: Not on file   Physically Abused: Not on file   Sexually Abused: Not on file    Past Surgical History:  Procedure Laterality Date   ABDOMINAL HYSTERECTOMY     CHOLECYSTECTOMY N/A 02/24/2019   Procedure: LAPAROSCOPIC CHOLECYSTECTOMY WITH INTRAOPERATIVE CHOLANGIOGRAM;  Surgeon: Manus Rudd, MD;  Location: [facility] OR;  Service: General;  Laterality: N/A;   KNEE SURGERY     left   THYROIDECTOMY     TOTAL KNEE ARTHROPLASTY     right knee    Family History  Problem Relation Age of Onset   Asthma Other    Cancer Other    Hypertension Other    Diabetes Other     Allergies  Allergen Reactions   Latex Hives   Metformin And Related     Hair loss   Penicillins Hives   Warfarin Sodium Hives    REACTION: Got rash when took one pill of coumadin  related to knee replacement surgery in 2007.   Iodinated Diagnostic Agents Other (See Comments)    PT STATES SHE HAD AN ALLERGIC  REACTION OF BLISTERS ON HANDS AND FEET 2 DAYS AFTER CT SCAN W/ IV CONTRAST INJECTION, DR. MANSELL SUGGESTS 13 HR PREP//A.C.    Current Outpatient Medications on File Prior to Visit  Medication Sig Dispense Refill   albuterol (VENTOLIN HFA) 108 (90 Base) MCG/ACT inhaler Inhale 2 puffs into the lungs every 6 (six) hours as needed for wheezing or shortness of breath. 18 g 2   amLODipine (NORVASC) 10 MG tablet Take 1 tablet (10 mg total) by mouth daily. 90 tablet 2   aspirin 81 MG tablet Take 1 tablet (81 mg total) daily by mouth. 90 tablet 0   brimonidine-timolol (COMBIGAN) 0.2-0.5 % ophthalmic solution Place 1 drop into both eyes every 12 (twelve) hours.     cycloSPORINE (RESTASIS) 0.05 % ophthalmic emulsion Place 1 drop into both eyes 2 (two) times daily.     latanoprost (XALATAN) 0.005 % ophthalmic solution Place 1 drop into both eyes at bedtime.      lisinopril (ZESTRIL) 20 MG tablet Take 1  tablet (20 mg total) by mouth daily. 90 tablet 1   Accu-Chek FastClix Lancets MISC Use as directed 100 each 2   Blood Glucose Monitoring Suppl (ACCU-CHEK GUIDE ME) w/Device KIT 1 Device by Does not apply route daily. 1 kit 0   glucose blood (ACCU-CHEK GUIDE) test strip Use as instructed 100 each 12   glucose blood (TRUE METRIX BLOOD GLUCOSE TEST) test strip Use as instructed to check blood sugar 3 times daily. E11.9 100 each 12   Lancet Devices (ACCU-CHEK SOFTCLIX) lancets Use as instructed for 3 times daily testing of blood glucose 1 each 0   Lancets Misc. (ACCU-CHEK FASTCLIX LANCET) KIT Use as directed 1 kit 12   levothyroxine (EUTHYROX) 100 MCG tablet Take 1 tablet (100 mcg total) by mouth daily. Please make PCP appointment. 90 tablet 1   pravastatin (PRAVACHOL) 40 MG tablet TAKE 1 TABLET BY MOUTH ONCE DAILY (DOSE  INCREASE) 90 tablet 1   sitaGLIPtin (JANUVIA) 100 MG tablet Take 1 tablet (100 mg total) by mouth daily. 90 tablet 1   TRUEPLUS LANCETS 28G MISC Use as directed 100 each 1   No current facility-administered medications on file prior to visit.     patient denies chest pain, shortness of breath, orthopnea. Denies lower extremity edema, abdominal pain, change in appetite, change in bowel movements. Patient denies rashes, musculoskeletal complaints. No other specific complaints in a complete review of systems.   BP 117/70 (BP Location: Left Arm, Patient Position: Sitting, Cuff Size: Large)    Pulse (!) 57    Temp 98.4 F (36.9 C) (Oral)    Resp 18    Ht 5\' 2"  (1.575 m)    Wt 252 lb (114.3 kg)    SpO2 99%    BMI 46.09 kg/m   Well-developed well-nourished female in no acute distress. HEENT exam atraumatic, normocephalic, extraocular muscles are intact. Neck is supple. No jugular venous distention no thyromegaly. Chest clear to auscultation without increased work of breathing. Cardiac exam S1 and S2 are regular. Abdominal exam, overweight active bowel sounds, soft, nontender.  Extremities no edema. Neurologic exam she is alert without any motor sensory deficits. Gait is normal.   Foot exam- no callus, normal senasation, normal pulse  Hyperlipidemia Well controlled Continue current meds  Essential hypertension  BP Readings from Last 3 Encounters:  08/05/20 117/70  07/08/20 117/66  12/26/19 101/74  bp well controlled Continue current meds   Controlled type 2 diabetes mellitus without complication, without long-term  current use of insulin ([place]) Lab Results  Component Value Date   HGBA1C 6.9 (A) 07/08/2020   She get regular eye exam On ace-I, and urine microalbumin well within normal range Foot exam normal today- normal  Class 3 severe obesity with serious comorbidity and body mass index (BMI) of 45.0 to 49.9 in adult [facility]) She understands need for weight loss-  Attempting modest calorie restriction

## 2020-08-05 NOTE — Assessment & Plan Note (Signed)
Lab Results  Component Value Date   HGBA1C 6.9 (A) 07/08/2020   She get regular eye exam On ace-I, and urine microalbumin well within normal range Foot exam normal today- normal

## 2020-08-11 DIAGNOSIS — M1712 Unilateral primary osteoarthritis, left knee: Secondary | ICD-10-CM | POA: Diagnosis not present

## 2020-08-21 ENCOUNTER — Encounter (HOSPITAL_COMMUNITY): Payer: Self-pay | Admitting: Emergency Medicine

## 2020-08-21 ENCOUNTER — Other Ambulatory Visit: Payer: Self-pay

## 2020-08-21 ENCOUNTER — Emergency Department (HOSPITAL_COMMUNITY)
Admission: EM | Admit: 2020-08-21 | Discharge: 2020-08-22 | Disposition: A | Discharge status: Home / Self Care | Payer: Medicare HMO | Attending: Emergency Medicine | Admitting: Emergency Medicine

## 2020-08-21 DIAGNOSIS — E119 Type 2 diabetes mellitus without complications: Secondary | ICD-10-CM | POA: Insufficient documentation

## 2020-08-21 DIAGNOSIS — Z7982 Long term (current) use of aspirin: Secondary | ICD-10-CM | POA: Insufficient documentation

## 2020-08-21 DIAGNOSIS — S91311A Laceration without foreign body, right foot, initial encounter: Secondary | ICD-10-CM | POA: Insufficient documentation

## 2020-08-21 DIAGNOSIS — Z87891 Personal history of nicotine dependence: Secondary | ICD-10-CM | POA: Diagnosis not present

## 2020-08-21 DIAGNOSIS — Z23 Encounter for immunization: Secondary | ICD-10-CM | POA: Insufficient documentation

## 2020-08-21 DIAGNOSIS — W2209XA Striking against other stationary object, initial encounter: Secondary | ICD-10-CM | POA: Diagnosis not present

## 2020-08-21 DIAGNOSIS — Z79899 Other long term (current) drug therapy: Secondary | ICD-10-CM | POA: Insufficient documentation

## 2020-08-21 DIAGNOSIS — E039 Hypothyroidism, unspecified: Secondary | ICD-10-CM | POA: Insufficient documentation

## 2020-08-21 DIAGNOSIS — I10 Essential (primary) hypertension: Secondary | ICD-10-CM | POA: Diagnosis not present

## 2020-08-21 NOTE — ED Triage Notes (Signed)
Pt c/o laceration/skin tear on back of R heel from door coming out of her house.  Bleeding controlled.  Pt is diabetic.

## 2020-08-22 DIAGNOSIS — S91311A Laceration without foreign body, right foot, initial encounter: Secondary | ICD-10-CM | POA: Diagnosis not present

## 2020-08-22 MED ORDER — BACITRACIN ZINC 500 UNIT/GM EX OINT
1.0000 "application " | TOPICAL_OINTMENT | Freq: Two times a day (BID) | CUTANEOUS | Status: DC
  Administered 2020-08-22: 1 via TOPICAL
  Filled 2020-08-22: qty 0.9

## 2020-08-22 MED ORDER — TETANUS-DIPHTH-ACELL PERTUSSIS 5-2.5-18.5 LF-MCG/0.5 IM SUSY
0.5000 mL | PREFILLED_SYRINGE | Freq: Once | INTRAMUSCULAR | Status: AC
  Administered 2020-08-22: 0.5 mL via INTRAMUSCULAR
  Filled 2020-08-22: qty 0.5

## 2020-08-22 NOTE — ED Notes (Signed)
Discharge instructions provided to patient. Wound care provided. Verbalized understanding. Alert and oriented. Ambulated with steady gait out of ED.

## 2020-08-22 NOTE — Discharge Instructions (Addendum)
Keep area clean with soap and warm water.  Can apply topical neosporin or similar and bandage. Monitor for redness, swelling, pus, etc. Follow-up with your primary care doctor. Return here for new concerns.

## 2020-08-22 NOTE — ED Provider Notes (Signed)
[facility] EMERGENCY DEPARTMENT Provider Note   CSN: [id number] Arrival date & time: 08/21/20  1836     History Chief Complaint  Patient presents with  . Foot Injury    Kayla Bean is a 64 y.o. female.  The history is provided by the patient and medical records.  Foot Injury   64 year old female with history of arthritis, asthma, diabetes, hypertension, presenting to the ED with right foot injury. States she was walking in the house and the screen door closed on the back of her heel and cut her foot. States she is diabetic and was concerned about infection. Date of last tetanus unknown.  Past Medical History:  Diagnosis Date  . Arthritis   . Asthma   . Diabetes mellitus   . Heart murmur   . Hemorrhoids   . Hypertension   . Thyroid disease    hypothyroidism    Patient Active Problem List   Diagnosis Date Noted  . Controlled type 2 diabetes mellitus without complication, without long-term current use of insulin ([place]) 07/08/2020  . Class 3 severe obesity with serious comorbidity and body mass index (BMI) of 45.0 to 49.9 in adult ([place]) 10/01/2018  . Moderate persistent asthma without complication 71/24/5809  . Hyperlipidemia 03/22/2017  . Cholelithiasis 12/24/2012  . LIPOMA 07/26/2010  . CONSTIPATION, CHRONIC 01/18/2009  . EUSTACHIAN TUBE DYSFUNCTION, RIGHT 10/16/2008  . LOM 09/01/2008  . Essential hypertension 08/18/2008  . Osteoarthritis of left knee 04/23/2008  . Hypothyroidism 10/15/2007    Past Surgical History:  Procedure Laterality Date  . ABDOMINAL HYSTERECTOMY    . CHOLECYSTECTOMY N/A 02/24/2019   Procedure: LAPAROSCOPIC CHOLECYSTECTOMY WITH INTRAOPERATIVE CHOLANGIOGRAM;  Surgeon: Donnie Mesa, MD;  Location: [facility];  Service: General;  Laterality: N/A;  . KNEE SURGERY     left  . THYROIDECTOMY    . TOTAL KNEE ARTHROPLASTY     right knee     OB History   No obstetric history on file.     Family History  Problem  Relation Age of Onset  . Asthma Other   . Cancer Other   . Hypertension Other   . Diabetes Other     Social History   Tobacco Use  . Smoking status: Former Smoker    Quit date: 09/18/2001    Years since quitting: 18.9  . Smokeless tobacco: Never Used  Substance Use Topics  . Alcohol use: Yes  . Drug use: No    Home Medications Prior to Admission medications   Medication Sig Start Date End Date Taking? Authorizing Provider  Accu-Chek FastClix Lancets MISC Use as directed 04/25/19   Ladell Pier, MD  albuterol (VENTOLIN HFA) 108 (90 Base) MCG/ACT inhaler Inhale 2 puffs into the lungs every 6 (six) hours as needed for wheezing or shortness of breath. 07/28/20   Ladell Pier, MD  amLODipine (NORVASC) 10 MG tablet Take 1 tablet (10 mg total) by mouth daily. 11/04/19   Ladell Pier, MD  aspirin 81 MG tablet Take 1 tablet (81 mg total) daily by mouth. 07/30/17   Ladell Pier, MD  Blood Glucose Monitoring Suppl (ACCU-CHEK GUIDE ME) w/Device KIT 1 Device by Does not apply route daily. 04/25/19   Ladell Pier, MD  brimonidine-timolol (COMBIGAN) 0.2-0.5 % ophthalmic solution Place 1 drop into both eyes every 12 (twelve) hours.    [provider]  cycloSPORINE (RESTASIS) 0.05 % ophthalmic emulsion Place 1 drop into both eyes 2 (two) times daily.  [provider]  glipiZIDE (GLUCOTROL) 10 MG tablet Take 1 tablet (10 mg total) by mouth 2 (two) times daily before a meal. 08/05/20   Swords, Darrick Penna, MD  glucose blood (ACCU-CHEK GUIDE) test strip Use as instructed 04/25/19   Ladell Pier, MD  glucose blood (TRUE METRIX BLOOD GLUCOSE TEST) test strip Use as instructed to check blood sugar 3 times daily. E11.9 07/31/17   Ladell Pier, MD  Lancet Devices [facility]) lancets Use as instructed for 3 times daily testing of blood glucose 08/09/16   Langeland, Dawn T, MD  Lancets Misc. (ACCU-CHEK FASTCLIX LANCET) KIT Use as directed 04/25/19    Ladell Pier, MD  latanoprost (XALATAN) 0.005 % ophthalmic solution Place 1 drop into both eyes at bedtime.     [provider]  levothyroxine (EUTHYROX) 100 MCG tablet Take 1 tablet (100 mcg total) by mouth daily. Please make PCP appointment. 07/08/20   Ladell Pier, MD  lisinopril (ZESTRIL) 20 MG tablet Take 1 tablet (20 mg total) by mouth daily. 07/08/20   Ladell Pier, MD  pravastatin (PRAVACHOL) 40 MG tablet TAKE 1 TABLET BY MOUTH ONCE DAILY (DOSE  INCREASE) 07/08/20   Ladell Pier, MD  sitaGLIPtin (JANUVIA) 100 MG tablet Take 1 tablet (100 mg total) by mouth daily. 07/08/20   Ladell Pier, MD  TRUEPLUS LANCETS 28G MISC Use as directed 05/10/17   Ladell Pier, MD    Allergies    Latex, Metformin and related, Penicillins, Warfarin sodium, and Iodinated diagnostic agents  Review of Systems   Review of Systems  Skin: Positive for wound.  All other systems reviewed and are negative.   Physical Exam Updated Vital Signs BP 113/65 (BP Location: Left Arm)   Pulse 70   Temp 99.1 F (37.3 C) (Oral)   Resp 18   SpO2 100%   Physical Exam Vitals and nursing note reviewed.  Constitutional:      Appearance: She is well-developed.  HENT:     Head: Normocephalic and atraumatic.  Eyes:     Conjunctiva/sclera: Conjunctivae normal.     Pupils: Pupils are equal, round, and reactive to light.  Cardiovascular:     Rate and Rhythm: Normal rate and regular rhythm.     Heart sounds: Normal heart sounds.  Pulmonary:     Effort: Pulmonary effort is normal.     Breath sounds: Normal breath sounds.  Abdominal:     General: Bowel sounds are normal.     Palpations: Abdomen is soft.  Musculoskeletal:        General: Normal range of motion.     Cervical back: Normal range of motion.     Comments: Skin tear noted to right posterior heel, no active bleeding, no surrounding swelling, no bony deformity, DP pulse intact, ambulatory  Skin:    General: Skin  is warm and dry.  Neurological:     Mental Status: She is alert and oriented to person, place, and time.     ED Results / Procedures / Treatments   Labs (all labs ordered are listed, but only abnormal results are displayed) Labs Reviewed - No data to display  EKG None  Radiology No results found.  Procedures Procedures (including critical care time)  Medications Ordered in ED Medications  bacitracin ointment 1 application (1 application Topical Given 08/22/20 0138)  Tdap (BOOSTRIX) injection 0.5 mL (0.5 mLs Intramuscular Given 08/22/20 0138)    ED Course  I have reviewed the triage  vital signs and the nursing notes.  Pertinent labs & imaging results that were available during my care of the patient were reviewed by me and considered in my medical decision making (see chart for details).    MDM Rules/Calculators/A&P  64 year old female presenting to the ED with skin tear of right posterior heel after screen door closed on her foot.  There is no active bleeding.  No signs of infection.  No bony deformity.  She remains ambulatory.  Tetanus will be updated, bacitracin and dressing applied.  Continue home wound care instructions, can apply topical Neosporin as desired.  Follow-up with PCP.  Return here for any new or acute changes.  Final Clinical Impression(s) / ED Diagnoses Final diagnoses:  Tear of skin of right heel, initial encounter    Rx / DC Orders ED Discharge Orders    None       Larene Pickett, PA-C 08/22/20 0146    Merrily Pew, MD 08/22/20 [phone]

## 2020-10-18 ENCOUNTER — Telehealth: Payer: Self-pay | Admitting: Internal Medicine

## 2020-10-18 NOTE — Telephone Encounter (Signed)
Called patient, patient answered and hung up, so was UTR of LVM. Called to advise patient that her appointment for 11/09/20 had been cancelled due to the provider being out of the office and would need to be rescheduled.

## 2020-10-26 ENCOUNTER — Other Ambulatory Visit: Payer: Self-pay | Admitting: Internal Medicine

## 2020-10-26 DIAGNOSIS — I10 Essential (primary) hypertension: Secondary | ICD-10-CM

## 2020-11-09 ENCOUNTER — Telehealth: Payer: Medicare HMO | Admitting: Internal Medicine

## 2020-11-29 DIAGNOSIS — M1712 Unilateral primary osteoarthritis, left knee: Secondary | ICD-10-CM | POA: Diagnosis not present

## 2020-12-17 ENCOUNTER — Encounter: Payer: Self-pay | Admitting: Internal Medicine

## 2020-12-17 ENCOUNTER — Ambulatory Visit: Payer: Medicare HMO | Attending: Internal Medicine | Admitting: Internal Medicine

## 2020-12-17 ENCOUNTER — Other Ambulatory Visit: Payer: Self-pay

## 2020-12-17 DIAGNOSIS — E785 Hyperlipidemia, unspecified: Secondary | ICD-10-CM

## 2020-12-17 DIAGNOSIS — E1169 Type 2 diabetes mellitus with other specified complication: Secondary | ICD-10-CM | POA: Diagnosis not present

## 2020-12-17 DIAGNOSIS — I10 Essential (primary) hypertension: Secondary | ICD-10-CM | POA: Diagnosis not present

## 2020-12-17 DIAGNOSIS — E890 Postprocedural hypothyroidism: Secondary | ICD-10-CM

## 2020-12-17 MED ORDER — GLIPIZIDE 10 MG PO TABS: 10.0000 mg | ORAL_TABLET | Freq: Two times a day (BID) | ORAL | 1 refills | Status: DC

## 2020-12-17 MED ORDER — LISINOPRIL 20 MG PO TABS: 20.0000 mg | ORAL_TABLET | Freq: Every day | ORAL | 1 refills | Status: DC

## 2020-12-17 MED ORDER — PRAVASTATIN SODIUM 40 MG PO TABS: ORAL_TABLET | ORAL | 1 refills | Status: DC

## 2020-12-17 MED ORDER — AMLODIPINE BESYLATE 10 MG PO TABS: 10.0000 mg | ORAL_TABLET | Freq: Every day | ORAL | 1 refills | Status: DC

## 2020-12-17 MED ORDER — SITAGLIPTIN PHOSPHATE 100 MG PO TABS: 100.0000 mg | ORAL_TABLET | Freq: Every day | ORAL | 1 refills | Status: DC

## 2020-12-17 MED ORDER — LEVOTHYROXINE SODIUM 100 MCG PO TABS: 100.0000 ug | ORAL_TABLET | Freq: Every day | ORAL | 1 refills | Status: DC

## 2020-12-17 NOTE — Progress Notes (Signed)
Virtual Visit via Telephone Note  I connected with Kayla Bean on 12/17/20 at 11:23 a.m by telephone and verified that I am speaking with the correct person using two identifiers.  Location: Patient: home Provider: office  Participants: Myself Patient CMA: Ms. Sallyanne Havers   I discussed the limitations, risks, security and privacy concerns of performing an evaluation and management service by telephone and the availability of in person appointments. I also discussed with the patient that there may be a patient responsible charge related to this service. The patient expressed understanding and agreed to proceed.   History of Present Illness: Patient with history of diabetes, hypertension,HL,post thyroidectomy hypothyroidism (1973, noncancerous), asthma, CKD stage III, OA kneeand obesity.  Last seen 07/2020.    DIABETES TYPE 2 Last A1C:   Results for orders placed or performed in visit on 08/05/20  Glucose (CBG)  Result Value Ref Range   POC Glucose 151 (A) 70 - 99 mg/dl    Lab Results  Component Value Date   HGBA1C 6.9 (A) 07/08/2020    Med Adherence:  [x]  Yes  -Januvia and GLucotrol Medication side effects:  []  Yes    []  No Home Monitoring?  BS this a.m 140 post breakfast.  BS before BF 130s Home glucose results range: Diet Adherence:  wgh today 262.  Gained 10 lbs over past 4 mths.  Admits she has been eating more out of boredom being stuck in house with COVID Exercise: []  Yes    [x]  No.  Plans to start walking at a park that is close to her house Hypoglycemic episodes?: []  Yes    [x]  No Numbness of the feet? []  Yes    [x]  No Retinopathy hx? []  Yes    []  No Last eye exam:  Comments:   HYPERTENSION Currently taking: see medication list Med Adherence: [x]  Yes    []  No Medication side effects: []  Yes    [x]  No Adherence with salt restriction: [x]  Yes    []  No Home Monitoring?: [x]  Yes    []  No Monitoring Frequency: once a wk Home BP results range: 124/76 this  a.m SOB? []  Yes    [x]  No -only when she has to put mask on.  Has to use Albuterol inhaler when she takes mask off.  Has a face shield but has not used it.  Wonders if okay to use instead Chest Pain?: []  Yes    [x]  No Leg swelling?: []  Yes    [x]  No Headaches?: []  Yes    [x]  No Dizziness? []  Yes    [x]  No Comments:   Hypothyroid:  Compliant with thyroid med.  HL: Taking and tolerating Pravachol   Outpatient Encounter Medications as of 12/17/2020  Medication Sig  . Accu-Chek FastClix Lancets MISC Use as directed  . albuterol (VENTOLIN HFA) 108 (90 Base) MCG/ACT inhaler Inhale 2 puffs into the lungs every 6 (six) hours as needed for wheezing or shortness of breath.  Marland Kitchen amLODipine (NORVASC) 10 MG tablet Take 1 tablet by mouth once daily  . aspirin 81 MG tablet Take 1 tablet (81 mg total) daily by mouth.  . Blood Glucose Monitoring Suppl (ACCU-CHEK GUIDE ME) w/Device KIT 1 Device by Does not apply route daily.  . brimonidine-timolol (COMBIGAN) 0.2-0.5 % ophthalmic solution Place 1 drop into both eyes every 12 (twelve) hours.  . cycloSPORINE (RESTASIS) 0.05 % ophthalmic emulsion Place 1 drop into both eyes 2 (two) times daily.  Marland Kitchen glipiZIDE (GLUCOTROL) 10 MG tablet Take 1 tablet (  10 mg total) by mouth 2 (two) times daily before a meal.  . glucose blood (ACCU-CHEK GUIDE) test strip Use as instructed  . glucose blood (TRUE METRIX BLOOD GLUCOSE TEST) test strip Use as instructed to check blood sugar 3 times daily. E11.9  . Lancet Devices (ACCU-CHEK SOFTCLIX) lancets Use as instructed for 3 times daily testing of blood glucose  . Lancets Misc. (ACCU-CHEK FASTCLIX LANCET) KIT Use as directed  . latanoprost (XALATAN) 0.005 % ophthalmic solution Place 1 drop into both eyes at bedtime.   Marland Kitchen levothyroxine (EUTHYROX) 100 MCG tablet Take 1 tablet (100 mcg total) by mouth daily. Please make PCP appointment.  Marland Kitchen lisinopril (ZESTRIL) 20 MG tablet Take 1 tablet (20 mg total) by mouth daily.  . pravastatin  (PRAVACHOL) 40 MG tablet TAKE 1 TABLET BY MOUTH ONCE DAILY (DOSE  INCREASE)  . sitaGLIPtin (JANUVIA) 100 MG tablet Take 1 tablet (100 mg total) by mouth daily.  . TRUEPLUS LANCETS 28G MISC Use as directed   No facility-administered encounter medications on file as of 12/17/2020.    Observations/Objective: Results for orders placed or performed in visit on 08/05/20  Glucose (CBG)  Result Value Ref Range   POC Glucose 151 (A) 70 - 99 mg/dl     Chemistry      Component Value Date/Time   NA 140 07/08/2020 1609   K 4.3 07/08/2020 1609   CL 102 07/08/2020 1609   CO2 25 07/08/2020 1609   BUN 11 07/08/2020 1609   CREATININE 1.01 (H) 07/08/2020 1609   CREATININE 1.29 (H) 07/19/2016 1105      Component Value Date/Time   CALCIUM 9.6 07/08/2020 1609   ALKPHOS 88 07/08/2020 1609   AST 19 07/08/2020 1609   ALT 13 07/08/2020 1609   BILITOT 0.5 07/08/2020 1609     Lab Results  Component Value Date   CHOL 125 07/08/2020   HDL 39 (L) 07/08/2020   LDLCALC 68 07/08/2020   TRIG 96 07/08/2020   CHOLHDL 3.2 07/08/2020   Lab Results  Component Value Date   WBC 9.5 10/22/2019   HGB 13.1 10/22/2019   HCT 39.1 10/22/2019   MCV 92.0 10/22/2019   PLT 221 10/22/2019   Lab Results  Component Value Date   TSH 1.170 07/08/2020    Assessment and Plan: 1. Type 2 diabetes mellitus with morbid obesity (HCC) Blood sugars are at goal.  She will continue current oral medications. -Discussed dietary changes that she can implement to help get her weight back down including cutting back on portion sizes of white carbohydrates, eliminating sugary drinks from the diet, eating more white lean meat like chicken and [place] rather than beef or pork and incorporating fresh fruits and vegetables into the diet. -Advised her to start low and go slow with her exercise routine.  Recommend that she starts walking at the park at least twice a week for 30 minutes and eventually try to get up to 3 times a week. -  glipiZIDE (GLUCOTROL) 10 MG tablet; Take 1 tablet (10 mg total) by mouth 2 (two) times daily before a meal.  Dispense: 180 tablet; Refill: 1 - sitaGLIPtin (JANUVIA) 100 MG tablet; Take 1 tablet (100 mg total) by mouth daily.  Dispense: 90 tablet; Refill: 1  2. Essential hypertension Reported blood pressure reading at goal.  She will continue current medications and low-salt diet - amLODipine (NORVASC) 10 MG tablet; Take 1 tablet (10 mg total) by mouth daily.  Dispense: 90 tablet; Refill: 1 - lisinopril (  ZESTRIL) 20 MG tablet; Take 1 tablet (20 mg total) by mouth daily.  Dispense: 90 tablet; Refill: 1  3. Hyperlipidemia associated with type 2 diabetes mellitus (HCC) - pravastatin (PRAVACHOL) 40 MG tablet; TAKE 1 TABLET BY MOUTH ONCE DAILY (DOSE  INCREASE)  Dispense: 90 tablet; Refill: 1  4. Postoperative hypothyroidism Last TSH level within normal range.  She will continue current dose of levothyroxine - levothyroxine (EUTHYROX) 100 MCG tablet; Take 1 tablet (100 mcg total) by mouth daily. Please make PCP appointment.  Dispense: 90 tablet; Refill: 1   Follow Up Instructions: 4 mth with me 3 wks with clinical pharmacist for [employer] [facility]   I discussed the assessment and treatment plan with the patient. The patient was provided an opportunity to ask questions and all were answered. The patient agreed with the plan and demonstrated an understanding of the instructions.   The patient was advised to call back or seek an in-person evaluation if the symptoms worsen or if the condition fails to improve as anticipated.  I spent 15 minutes on this telephone encounter Karle Plumber, MD

## 2021-01-07 ENCOUNTER — Other Ambulatory Visit: Payer: Self-pay | Admitting: Internal Medicine

## 2021-01-07 DIAGNOSIS — J4540 Moderate persistent asthma, uncomplicated: Secondary | ICD-10-CM

## 2021-02-16 ENCOUNTER — Other Ambulatory Visit: Payer: Self-pay | Admitting: Internal Medicine

## 2021-02-16 DIAGNOSIS — I10 Essential (primary) hypertension: Secondary | ICD-10-CM

## 2021-02-20 DIAGNOSIS — Z0100 Encounter for examination of eyes and vision without abnormal findings: Secondary | ICD-10-CM | POA: Diagnosis not present

## 2021-02-22 ENCOUNTER — Encounter: Payer: Self-pay | Admitting: Internal Medicine

## 2021-02-22 ENCOUNTER — Ambulatory Visit: Payer: Medicare HMO | Attending: Internal Medicine | Admitting: Internal Medicine

## 2021-02-22 ENCOUNTER — Other Ambulatory Visit: Payer: Self-pay

## 2021-02-22 VITALS — BP 126/84 | HR 56 | Resp 16 | Ht 62.0 in | Wt 249.4 lb

## 2021-02-22 DIAGNOSIS — Z0000 Encounter for general adult medical examination without abnormal findings: Secondary | ICD-10-CM | POA: Diagnosis not present

## 2021-02-22 DIAGNOSIS — Z23 Encounter for immunization: Secondary | ICD-10-CM

## 2021-02-22 DIAGNOSIS — Z780 Asymptomatic menopausal state: Secondary | ICD-10-CM | POA: Diagnosis not present

## 2021-02-22 NOTE — Progress Notes (Signed)
Subjective:   Kayla Bean is a 65 y.o. female who presents for an Initial Medicare Annual Wellness Visit. Patient with history of diabetes, hypertension,HL,post thyroidectomyhypothyroidism(1973, noncancerous), asthma, CKD stage III, OA knee and obesity. Review of Systems           Objective:    Today's Vitals   02/22/21 1431  BP: 126/84  Pulse: (!) 56  Resp: 16  SpO2: 99%  Weight: 249 lb 6.4 oz (113.1 kg)  Height: 5\' 2"  (1.575 m)   Body mass index is 45.62 kg/m.  Advanced Directives 02/22/2021 10/22/2019 02/25/2019 07/03/2018 09/09/2017 05/10/2017 03/22/2017  Does Patient Have a Medical Advance Directive? No No No Yes No No No  Type of Advance Directive - - - - - - -  Does patient want to make changes to medical advance directive? - - - No - Patient declined - - -  Would patient like information on creating a medical advance directive? No - Patient declined No - Patient declined No - Guardian declined - - No - Patient declined -    Current Medications (verified) Outpatient Encounter Medications as of 02/22/2021  Medication Sig  . Accu-Chek FastClix Lancets MISC Use as directed  . albuterol (VENTOLIN HFA) 108 (90 Base) MCG/ACT inhaler INHALE 2 PUFFS BY MOUTH EVERY 6 HOURS AS NEEDED FOR WHEEZING FOR SHORTNESS OF BREATH  . amLODipine (NORVASC) 10 MG tablet Take 1 tablet (10 mg total) by mouth daily.  04/24/2021 aspirin 81 MG tablet Take 1 tablet (81 mg total) daily by mouth.  . Blood Glucose Monitoring Suppl (ACCU-CHEK GUIDE ME) w/Device KIT 1 Device by Does not apply route daily.  . brimonidine-timolol (COMBIGAN) 0.2-0.5 % ophthalmic solution Place 1 drop into both eyes every 12 (twelve) hours.  . cycloSPORINE (RESTASIS) 0.05 % ophthalmic emulsion Place 1 drop into both eyes 2 (two) times daily.  Marland Kitchen glipiZIDE (GLUCOTROL) 10 MG tablet Take 1 tablet (10 mg total) by mouth 2 (two) times daily before a meal.  . glucose blood (ACCU-CHEK GUIDE) test strip Use as instructed  . glucose  blood (TRUE METRIX BLOOD GLUCOSE TEST) test strip Use as instructed to check blood sugar 3 times daily. E11.9  . Lancet Devices (ACCU-CHEK SOFTCLIX) lancets Use as instructed for 3 times daily testing of blood glucose  . Lancets Misc. (ACCU-CHEK FASTCLIX LANCET) KIT Use as directed  . latanoprost (XALATAN) 0.005 % ophthalmic solution Place 1 drop into both eyes at bedtime.   Marland Kitchen levothyroxine (EUTHYROX) 100 MCG tablet Take 1 tablet (100 mcg total) by mouth daily. Please make PCP appointment.  Marland Kitchen lisinopril (ZESTRIL) 20 MG tablet Take 1 tablet by mouth daily  . pravastatin (PRAVACHOL) 40 MG tablet TAKE 1 TABLET BY MOUTH ONCE DAILY (DOSE  INCREASE)  . sitaGLIPtin (JANUVIA) 100 MG tablet Take 1 tablet (100 mg total) by mouth daily.  . TRUEPLUS LANCETS 28G MISC Use as directed   No facility-administered encounter medications on file as of 02/22/2021.    Allergies (verified) Latex, Metformin and related, Penicillins, Warfarin sodium, and Iodinated diagnostic agents   History: Past Medical History:  Diagnosis Date  . Arthritis   . Asthma   . Diabetes mellitus   . Heart murmur   . Hemorrhoids   . Hypertension   . Thyroid disease    hypothyroidism   Past Surgical History:  Procedure Laterality Date  . ABDOMINAL HYSTERECTOMY    . CHOLECYSTECTOMY N/A 02/24/2019   Procedure: LAPAROSCOPIC CHOLECYSTECTOMY WITH INTRAOPERATIVE CHOLANGIOGRAM;  Surgeon: 04/26/2019, MD;  Location: [facility] OR;  Service: General;  Laterality: N/A;  . KNEE SURGERY     left  . THYROIDECTOMY    . TOTAL KNEE ARTHROPLASTY     right knee   Family History  Problem Relation Age of Onset  . Asthma Other   . Cancer Other   . Hypertension Other   . Diabetes Other    Social History   Socioeconomic History  . Marital status: Single    Spouse name: Not on file  . Number of children: Not on file  . Years of education: Not on file  . Highest education level: Not on file  Occupational History  . Not on file  Tobacco Use   . Smoking status: Former Smoker    Quit date: 09/18/2001    Years since quitting: 19.4  . Smokeless tobacco: Never Used  Substance and Sexual Activity  . Alcohol use: Yes  . Drug use: No  . Sexual activity: Not Currently  Other Topics Concern  . Not on file  Social History Narrative  . Not on file   Social Determinants of Health   Financial Resource Strain: Not on file  Food Insecurity: Not on file  Transportation Needs: Not on file  Physical Activity: Not on file  Stress: Not on file  Social Connections: Not on file    Tobacco Counseling Non- Smoker Occasional ETOH use No street drugs   Pain : No/denies pain  Diabetes: Yes CBG done?: No     Activities of Daily Living In your present state of health, do you have any difficulty performing the following activities: 02/22/2021  Hearing? N  Vision? N  Difficulty concentrating or making decisions? N  Walking or climbing stairs? N  Dressing or bathing? N  Doing errands, shopping? N  Preparing Food and eating ? N  Using the Toilet? N  In the past six months, have you accidently leaked urine? N  Do you have problems with loss of bowel control? N  Managing your Medications? N  Managing your Finances? N  Housekeeping or managing your Housekeeping? N  Some recent data might be hidden    Patient Care Team: Ladell Pier, MD as PCP - General (Internal Medicine) Dr. Melrose Nakayama -  [facility] Dr. Schuyler Amor -ophthalmology  Indicate any recent Medical Services you may have received from other than [facility] providers in the past year (date may be approximate).     Assessment:   This is a routine wellness examination for Kayson.  Hearing/Vision screen Whisper test normal.  Hearing Screening   '125Hz'$  $Remo'250Hz'Igfje$'500Hz'$'1000Hz'$'2000Hz'$'3000Hz'$'4000Hz'$'6000Hz'$'8000Hz'$   Right ear:           Left ear:             Visual Acuity Screening   Right eye Left eye Both eyes  Without correction: 20 50 $Rem'20 30 20 25  'sUfI$ With correction:      Wears reading glasses. Has appt for eye exam 04/28/2021 with Dr. Schuyler Amor.  Dietary issues and exercise activities discussed: -feels she is doing good with eating habits.  She has cut back on fried foods.  Uses air frier.  Drinks a lot of water. -not getting in much exercise.  She does have Silver Snickers and plans to join the gym to make use of her Silver sneakers plan..   Goals Addressed   None   She would like to get her weight down. Depression Screen PHQ 2/9 Scores 02/22/2021 08/05/2020 07/08/2020 06/12/2019  10/01/2018 07/03/2018 06/25/2018  PHQ - 2 Score 0 0 0 0 - 0 -  PHQ- 9 Score - - 0 - - - -  Exception Documentation - - - - Patient refusal - Patient refusal    Fall Risk Fall Risk  02/22/2021 12/17/2020 07/08/2020 06/12/2019 07/03/2018  Falls in the past year? 0 0 1 0 No  Number falls in past yr: 0 0 0 - -  Comment - - - - -  Injury with Fall? 0 0 0 - -  Risk for fall due to : No Fall Risks - - - -    FALL RISK PREVENTION PERTAINING TO THE HOME:  Any stairs in or around the home? Yes  If so, are there any without handrails? Yes  Home free of loose throw rugs in walkways, pet beds, electrical cords, etc? Yes  Adequate lighting in your home to reduce risk of falls? Yes   ASSISTIVE DEVICES UTILIZED TO PREVENT FALLS:  Life alert? No  Use of a cane, walker or w/c? No  Grab bars in the bathroom? Yes  Shower chair or bench in shower? No  Elevated toilet seat or a handicapped toilet? No   TIMED UP AND GO:  Was the test performed? Yes .  Length of time to ambulate 10 feet: 6 sec.   Gait steady and fast without use of assistive device  Cognitive Function: MMSE - Mini Mental State Exam 02/22/2021  Orientation to time 5  Orientation to Place 5  Registration 3  Attention/ Calculation 5  Recall 2  Language- name 2 objects 2  Language- repeat 1  Language- follow 3 step command 3  Language- read & follow direction 1  Write a sentence 1  Copy design 0  Total score 28         Immunizations Immunization History  Administered Date(s) Administered  . H1N1 08/18/2008  . Influenza Whole 07/31/2007, 08/18/2008, 08/10/2009, 07/26/2010  . Influenza, Quadrivalent, Recombinant, Inj, Pf 05/23/2019  . Influenza,inj,Quad PF,6+ Mos 06/17/2015, 07/19/2016, 05/14/2018, 05/19/2020  . PFIZER(Purple Top)SARS-COV-2 Vaccination 11/29/2019, 01/07/2020  . Pneumococcal Conjugate-13 05/10/2017  . Pneumococcal Polysaccharide-23 01/18/2009, 11/12/2019  . Td 01/18/2009  . Tdap 04/24/2019, 08/22/2020    TDAP status: Up to date  Flu Vaccine status: Up to date  Pneumococcal vaccine status: Up to date  Covid-19 vaccine status: Completed vaccines  Qualifies for Shingles Vaccine? Yes   Zostavax completed No   Shingrix Completed?: Yes given first shot today.  Will get second shot on her subsequent visit.  Screening Tests Health Maintenance  Topic Date Due  . Pneumococcal Vaccine 67-48 Years old (1 of 4 - PCV13) Never done  . Zoster Vaccines- Shingrix (1 of 2) Never done  . DEXA SCAN  Never done  . COVID-19 Vaccine (3 - Booster for Pfizer series) 06/08/2020  . HEMOGLOBIN A1C  01/06/2021  . OPHTHALMOLOGY EXAM  03/09/2021  . INFLUENZA VACCINE  04/18/2021  . FOOT EXAM  08/05/2021  . MAMMOGRAM  07/22/2022  . COLONOSCOPY (Pts 45-52yrs Insurance coverage will need to be confirmed)  03/07/2027  . TETANUS/TDAP  08/22/2030  . Hepatitis C Screening  Completed  . PNA vac Low Risk Adult  Completed  . HPV VACCINES  Aged Out    Health Maintenance  Health Maintenance Due  Topic Date Due  . Pneumococcal Vaccine 70-74 Years old (1 of 4 - PCV13) Never done  . Zoster Vaccines- Shingrix (1 of 2) Never done  . DEXA SCAN  Never done  .  COVID-19 Vaccine (3 - Booster for Pfizer series) 06/08/2020  . HEMOGLOBIN A1C  01/06/2021    Colorectal cancer screening: Type of screening: Colonoscopy. Completed 02/2017. Repeat every 10 years  Mammogram status: Completed 07/2020. Repeat every  year  Bone Density status: Ordered today. Pt provided with contact info and advised to call to schedule appt.  Lung Cancer Screening: (Low Dose CT Chest recommended if Age 71-80 years, 30 pack-year currently smoking OR have quit w/in 15years.) does not qualify.   Lung Cancer Screening Referral: NA  Additional Screening:  Hepatitis C Screening: does qualify; Completed 02/2016  Vision Screening: Recommended annual ophthalmology exams for early detection of glaucoma and other disorders of the eye. Is the patient up to date with their annual eye exam?  Yes  Who is the provider or what is the name of the office in which the patient attends annual eye exams?  Will be due for eye exam again the end of this month.  She has an appointment with the ophthalmologist in August. If pt is not established with a provider, would they like to be referred to a provider to establish care? NA.   Dental Screening: Recommended annual dental exams for proper oral hygiene.  Does not have a regular dentist but got information from [facility] last wk.  Needs new dentures upper  Community Resource Referral / Chronic Care Management:    Plan:    1. Encounter for Medicare annual wellness exam Commended her on healthy eating habits.  Encouraged her to continue to do so.  Encouraged her to take advantage of the Silver sneakers plan.  She plans to join the gym and start going.  2. Need for shingles vaccine First shot of Shingrix given today by CMA [place].  3. Postmenopausal estrogen deficiency - DG Bone Density; Future  4. Obesity, morbid, BMI 40.0-49.9 ([place]) See #1 above  I have personally reviewed and noted the following in the patient's chart:   . Medical and social history . Use of alcohol, tobacco or illicit drugs  . Current medications and supplements including opioid prescriptions. Patient is not currently taking opioid prescriptions. . Functional ability and status . Nutritional status . Physical  activity . Advanced directives . List of other physicians . Hospitalizations, surgeries, and ER visits in previous 12 months . Vitals . Screenings to include cognitive, depression, and falls . Referrals and appointments  In addition, I have reviewed and discussed with patient certain preventive protocols, quality metrics, and best practice recommendations. A written personalized care plan for preventive services as well as general preventive health recommendations were provided to patient.     Karle Plumber, MD   02/22/2021

## 2021-02-28 DIAGNOSIS — M1712 Unilateral primary osteoarthritis, left knee: Secondary | ICD-10-CM | POA: Diagnosis not present

## 2021-03-04 ENCOUNTER — Other Ambulatory Visit: Payer: Self-pay | Admitting: Internal Medicine

## 2021-03-04 DIAGNOSIS — J4540 Moderate persistent asthma, uncomplicated: Secondary | ICD-10-CM

## 2021-03-05 NOTE — Telephone Encounter (Signed)
Requested Prescriptions  Pending Prescriptions Disp Refills  . albuterol (VENTOLIN HFA) 108 (90 Base) MCG/ACT inhaler [Pharmacy Med Name: Albuterol Sulfate HFA 108 (90 Base) MCG/ACT Inhalation Aerosol Solution] 18 g 0    Sig: INHALE 2 PUFFS BY MOUTH EVERY 6 HOURS AS NEEDED FOR WHEEZING FOR SHORTNESS OF BREATH     Pulmonology:  Beta Agonists Failed - 03/04/2021  6:48 PM      Failed - One inhaler should last at least one month. If the patient is requesting refills earlier, contact the patient to check for uncontrolled symptoms.      Passed - Valid encounter within last 12 months    Recent Outpatient Visits          1 week ago Encounter for [employer] annual wellness exam   [place] Karle Plumber B, MD   2 months ago Type 2 diabetes mellitus with morbid obesity [facility])   [place] Karle Plumber B, MD   7 months ago Type 2 diabetes mellitus with morbid obesity [facility])   [place], MD   8 months ago Type 2 diabetes mellitus with morbid obesity [facility])   [place], MD   1 year ago Need for vaccination against Streptococcus pneumoniae   [place], RPH-CPP      Future Appointments            In 2 months Wynetta Emery Dalbert Batman, MD [place]

## 2021-04-28 DIAGNOSIS — H16223 Keratoconjunctivitis sicca, not specified as Sjogren's, bilateral: Secondary | ICD-10-CM | POA: Diagnosis not present

## 2021-04-28 DIAGNOSIS — H401111 Primary open-angle glaucoma, right eye, mild stage: Secondary | ICD-10-CM | POA: Diagnosis not present

## 2021-04-28 DIAGNOSIS — H02535 Eyelid retraction left lower eyelid: Secondary | ICD-10-CM | POA: Diagnosis not present

## 2021-04-28 DIAGNOSIS — E119 Type 2 diabetes mellitus without complications: Secondary | ICD-10-CM | POA: Diagnosis not present

## 2021-04-28 DIAGNOSIS — H401122 Primary open-angle glaucoma, left eye, moderate stage: Secondary | ICD-10-CM | POA: Diagnosis not present

## 2021-04-28 DIAGNOSIS — Z961 Presence of intraocular lens: Secondary | ICD-10-CM | POA: Diagnosis not present

## 2021-04-28 DIAGNOSIS — H02532 Eyelid retraction right lower eyelid: Secondary | ICD-10-CM | POA: Diagnosis not present

## 2021-04-28 DIAGNOSIS — H35033 Hypertensive retinopathy, bilateral: Secondary | ICD-10-CM | POA: Diagnosis not present

## 2021-04-28 LAB — HM DIABETES EYE EXAM

## 2021-05-04 ENCOUNTER — Encounter: Payer: Self-pay | Admitting: Internal Medicine

## 2021-05-12 ENCOUNTER — Encounter: Payer: Self-pay | Admitting: Internal Medicine

## 2021-05-12 ENCOUNTER — Ambulatory Visit: Payer: Medicare HMO | Attending: Internal Medicine | Admitting: Internal Medicine

## 2021-05-12 ENCOUNTER — Other Ambulatory Visit: Payer: Self-pay

## 2021-05-12 DIAGNOSIS — I10 Essential (primary) hypertension: Secondary | ICD-10-CM

## 2021-05-12 DIAGNOSIS — E1169 Type 2 diabetes mellitus with other specified complication: Secondary | ICD-10-CM | POA: Diagnosis not present

## 2021-05-12 DIAGNOSIS — E890 Postprocedural hypothyroidism: Secondary | ICD-10-CM

## 2021-05-12 DIAGNOSIS — Z23 Encounter for immunization: Secondary | ICD-10-CM

## 2021-05-12 DIAGNOSIS — E785 Hyperlipidemia, unspecified: Secondary | ICD-10-CM | POA: Diagnosis not present

## 2021-05-12 DIAGNOSIS — J4520 Mild intermittent asthma, uncomplicated: Secondary | ICD-10-CM | POA: Diagnosis not present

## 2021-05-12 LAB — POCT GLYCOSYLATED HEMOGLOBIN (HGB A1C): HbA1c, POC (controlled diabetic range): 7.9 % — AB (ref 0.0–7.0)

## 2021-05-12 LAB — GLUCOSE, POCT (MANUAL RESULT ENTRY): POC Glucose: 161 mg/dl — AB (ref 70–99)

## 2021-05-12 MED ORDER — DAPAGLIFLOZIN PROPANEDIOL 5 MG PO TABS: 5.0000 mg | ORAL_TABLET | Freq: Every day | ORAL | 3 refills | Status: DC

## 2021-05-12 MED ORDER — ALBUTEROL SULFATE HFA 108 (90 BASE) MCG/ACT IN AERS: INHALATION_SPRAY | RESPIRATORY_TRACT | 3 refills | Status: DC

## 2021-05-12 MED ORDER — ACCU-CHEK GUIDE ME W/DEVICE KIT: 1.0000 | PACK | Freq: Every day | 0 refills | Status: DC

## 2021-05-12 NOTE — Patient Instructions (Signed)
Your diabetes is not at goal. We have added a low-dose of a medication called Farxiga 5 mg daily. I have sent a prescription to your pharmacy for your diabetic glucometer.

## 2021-05-12 NOTE — Progress Notes (Signed)
Patient ID: Kayla Bean, female    DOB: 1956/07/11  MRN: [id number]  CC: chronic ds management  Subjective: Kayla Bean is a 65 y.o. female who presents for chronic ds management Her concerns today include:  Patient with history of diabetes, hypertension, HL, post thyroidectomy hypothyroidism (1973, noncancerous), asthma, CKD stage III, OA knee and obesity.  DIABETES TYPE 2 Last A1C:   Results for orders placed or performed in visit on 05/12/21  POCT glucose (manual entry)  Result Value Ref Range   POC Glucose 161 (A) 70 - 99 mg/dl  POCT glycosylated hemoglobin (Hb A1C)  Result Value Ref Range   Hemoglobin A1C     HbA1c POC (<> result, manual entry)     HbA1c, POC (prediabetic range)     HbA1c, POC (controlled diabetic range) 7.9 (A) 0.0 - 7.0 %    Med Adherence:  _0  Yes - Januvia and Glucotrol   _1  No Medication side effects:  _2  Yes    _3  No Home Monitoring?  _4  Yes    _5  No - misplaced glucometer Home glucose results range: Diet Adherence: _6  Yes - no fried foods and eating less   _7  No Exercise: _8  Yes    _9  No - too hot outside.  Has Silver Sneakers but afraid of being around people.  Does not want to get COVID Hypoglycemic episodes?: _10  Yes    _11  No Numbness of the feet? _12  Yes    _13  No Retinopathy hx? _14  Yes    _15  No Last eye exam:  Comments:   HYPERTENSION Currently taking: see medication list.  She is on lisinopril 20 mg daily, amlodipine 10 mg daily Med Adherence: _16  Yes    _17  No Medication side effects: _18  Yes    _19  No Adherence with salt restriction: _20  Yes    _21  No Home Monitoring?: _22  Yes    _23  No Monitoring Frequency:  Home BP results range:  SOB? _24  Yes    _25  No Chest Pain?: _26  Yes    _27  No Leg swelling?: _28  Yes    _29  No Headaches?: _30  Yes    _31  No Dizziness? _32  Yes    _33  No Comments:   HL: She is tolerating and taking Pravachol as prescribed.  Hypothyroid: Taking and tolerating levothyroxine.  Denies feeling hot or  cold all the time.  She has not had any major weight changes.  Requests refill on albuterol inhaler which she takes for asthma.  She has not had any major flareups.  Patient Active Problem List   Diagnosis Date Noted   Controlled type 2 diabetes mellitus without complication, without long-term current use of insulin ([place]) 07/08/2020   Class 3 severe obesity with serious comorbidity and body mass index (BMI) of 45.0 to 49.9 in adult ([place]) 10/01/2018   Moderate persistent asthma without complication 62/94/7654   Hyperlipidemia 03/22/2017   Cholelithiasis 12/24/2012   LIPOMA 07/26/2010   CONSTIPATION, CHRONIC 01/18/2009   EUSTACHIAN TUBE DYSFUNCTION, RIGHT 10/16/2008   LOM 09/01/2008   Essential hypertension 08/18/2008   Osteoarthritis of left knee 04/23/2008   Hypothyroidism 10/15/2007     Current Outpatient Medications on File Prior to Visit  Medication Sig Dispense Refill   albuterol (VENTOLIN HFA) 108 (90 Base) MCG/ACT inhaler INHALE 2 PUFFS BY MOUTH EVERY 6 HOURS AS NEEDED FOR WHEEZING FOR SHORTNESS OF BREATH 18 g 0   amLODipine (NORVASC) 10 MG tablet Take 1 tablet (10 mg total) by mouth daily. 90 tablet 1  aspirin 81 MG tablet Take 1 tablet (81 mg total) daily by mouth. 90 tablet 0   brimonidine-timolol (COMBIGAN) 0.2-0.5 % ophthalmic solution Place 1 drop into both eyes every 12 (twelve) hours.     cycloSPORINE (RESTASIS) 0.05 % ophthalmic emulsion Place 1 drop into both eyes 2 (two) times daily.     glipiZIDE (GLUCOTROL) 10 MG tablet Take 1 tablet (10 mg total) by mouth 2 (two) times daily before a meal. 180 tablet 1   glucose blood (ACCU-CHEK GUIDE) test strip Use as instructed 100 each 12   Lancet Devices (ACCU-CHEK SOFTCLIX) lancets Use as instructed for 3 times daily testing of blood glucose 1 each 0   Lancets Misc. (ACCU-CHEK FASTCLIX LANCET) KIT Use as directed 1 kit 12   latanoprost (XALATAN) 0.005 % ophthalmic solution Place 1 drop into both eyes at bedtime.       levothyroxine (EUTHYROX) 100 MCG tablet Take 1 tablet (100 mcg total) by mouth daily. Please make PCP appointment. 90 tablet 1   lisinopril (ZESTRIL) 20 MG tablet Take 1 tablet by mouth daily 90 tablet 0   pravastatin (PRAVACHOL) 40 MG tablet TAKE 1 TABLET BY MOUTH ONCE DAILY (DOSE  INCREASE) 90 tablet 1   sitaGLIPtin (JANUVIA) 100 MG tablet Take 1 tablet (100 mg total) by mouth daily. 90 tablet 1   No current facility-administered medications on file prior to visit.    Allergies  Allergen Reactions   Latex Hives   Metformin And Related     Hair loss   Penicillins Hives   Warfarin Sodium Hives    REACTION: Got rash when took one pill of coumadin  related to knee replacement surgery in 2007.   Iodinated Diagnostic Agents Other (See Comments)    PT STATES SHE HAD AN ALLERGIC REACTION OF BLISTERS ON HANDS AND FEET 2 DAYS AFTER CT SCAN W/ IV CONTRAST INJECTION, DR. MANSELL SUGGESTS 13 HR PREP//A.C.    Social History   Socioeconomic History   Marital status: Single    Spouse name: Not on file   Number of children: Not on file   Years of education: Not on file   Highest education level: Not on file  Occupational History   Not on file  Tobacco Use   Smoking status: Former    Types: Cigarettes    Quit date: 09/18/2001    Years since quitting: 19.6   Smokeless tobacco: Never  Substance and Sexual Activity   Alcohol use: Yes   Drug use: No   Sexual activity: Not Currently  Other Topics Concern   Not on file  Social History Narrative   Not on file   Social Determinants of Health   Financial Resource Strain: Not on file  Food Insecurity: Not on file  Transportation Needs: Not on file  Physical Activity: Not on file  Stress: Not on file  Social Connections: Not on file  Intimate Partner Violence: Not on file    Family History  Problem Relation Age of Onset   Asthma Other    Cancer Other    Hypertension Other    Diabetes Other     Past Surgical History:  Procedure  Laterality Date   ABDOMINAL HYSTERECTOMY     CHOLECYSTECTOMY N/A 02/24/2019   Procedure: LAPAROSCOPIC CHOLECYSTECTOMY WITH INTRAOPERATIVE CHOLANGIOGRAM;  Surgeon: Donnie Mesa, MD;  Location: [facility];  Service: General;  Laterality: N/A;   KNEE SURGERY     left   THYROIDECTOMY     TOTAL KNEE ARTHROPLASTY  right knee    ROS: Review of Systems Negative except as stated above  PHYSICAL EXAM: BP 123/74   Pulse 73   Resp 16   Wt 251 lb 12.8 oz (114.2 kg)   SpO2 99%   BMI 46.05 kg/m   Wt Readings from Last 3 Encounters:  05/12/21 251 lb 12.8 oz (114.2 kg)  02/22/21 249 lb 6.4 oz (113.1 kg)  12/17/20 262 lb (118.8 kg)    Physical Exam  General appearance - alert, well appearing, and in no distress Mental status - normal mood, behavior, speech, dress, motor activity, and thought processes Neck - supple, no significant adenopathy Chest - clear to auscultation, no wheezes, rales or rhonchi, symmetric air entry Heart - normal rate, regular rhythm, normal S1, S2, no murmurs, rubs, clicks or gallops Extremities - peripheral pulses normal, no pedal edema, no clubbing or cyanosis Diabetic Foot Exam - Simple   Simple Foot Form Visual Inspection See comments: Yes Sensation Testing Intact to touch and monofilament testing bilaterally: Yes Pulse Check Posterior Tibialis and Dorsalis pulse intact bilaterally: Yes Comments Flat-footed.  Toenails slightly overgrown.      CMP Latest Ref Rng & Units 07/08/2020 10/22/2019 02/25/2019  Glucose 65 - 99 mg/dL 111(H) 131(H) 244(H)  BUN 8 - 27 mg/dL _0 Creatinine 0.57 - 1.00 mg/dL 1.01(H) 1.09(H) 1.23(H)  Sodium 134 - 144 mmol/L 140 142 135  Potassium 3.5 - 5.2 mmol/L 4.3 3.9 4.1  Chloride 96 - 106 mmol/L 102 109 103  CO2 20 - 29 mmol/L _1 Calcium 8.7 - 10.3 mg/dL 9.6 9.1 8.6(L)  Total Protein 6.0 - 8.5 g/dL 8.6(H) 7.7 7.1  Total Bilirubin 0.0 - 1.2 mg/dL 0.5 0.6 0.6  Alkaline Phos 44 - 121 IU/L 88 87 65  AST 0 - 40 IU/L 19  16 53(H)  ALT 0 - 32 IU/L 13 17 41   Lipid Panel     Component Value Date/Time   CHOL 125 07/08/2020 1609   TRIG 96 07/08/2020 1609   HDL 39 (L) 07/08/2020 1609   CHOLHDL 3.2 07/08/2020 1609   CHOLHDL 3.6 03/14/2016 1238   VLDL 12 03/14/2016 1238   LDLCALC 68 07/08/2020 1609    CBC    Component Value Date/Time   WBC 9.5 10/22/2019 1113   RBC 4.25 10/22/2019 1113   HGB 13.1 10/22/2019 1113   HGB 15.8 03/08/2018 1649   HCT 39.1 10/22/2019 1113   HCT 44.5 03/08/2018 1649   PLT 221 10/22/2019 1113   PLT 280 03/08/2018 1649   MCV 92.0 10/22/2019 1113   MCV 87 03/08/2018 1649   MCH 30.8 10/22/2019 1113   MCHC 33.5 10/22/2019 1113   RDW 12.5 10/22/2019 1113   RDW 14.2 03/08/2018 1649   LYMPHSABS 1.8 10/22/2019 1113   MONOABS 0.4 10/22/2019 1113   EOSABS 0.4 10/22/2019 1113   BASOSABS 0.1 10/22/2019 1113    ASSESSMENT AND PLAN: 1. Type 2 diabetes mellitus with morbid obesity (HCC) A1c not at goal.  We will add low-dose of Farxiga.  This medicine should also help with her CKD.  Advised of the importance of staying hydrated while on this medication.  Advised that should she ever develop episodes of vomiting or diarrhea while on this medicine, she should stop it until things return to normal. Discussed and encourage healthy eating habits. Encouraged her to move more.  She has some exercise tapes at home.  I have encouraged her to use them or try walking  in place for 20 minutes several times a week while watching TV program. - POCT glucose (manual entry) - POCT glycosylated hemoglobin (Hb A1C) - Blood Glucose Monitoring Suppl (ACCU-CHEK GUIDE ME) w/Device KIT; 1 Device by Does not apply route daily.  Dispense: 1 kit; Refill: 0 - dapagliflozin propanediol (FARXIGA) 5 MG TABS tablet; Take 1 tablet (5 mg total) by mouth daily before breakfast.  Dispense: 30 tablet; Refill: 3 - Comprehensive metabolic panel  2. Essential hypertension At goal.  Continue current medications and  low-salt diet.  3. Hyperlipidemia associated with type 2 diabetes mellitus (HCC) Continue Pravachol  4. Postoperative hypothyroidism Continue levothyroxine - TSH  5. Mild intermittent asthma without complication - albuterol (VENTOLIN HFA) 108 (90 Base) MCG/ACT inhaler; INHALE 2 PUFFS BY MOUTH EVERY 6 HOURS AS NEEDED FOR WHEEZING FOR SHORTNESS OF BREATH  Dispense: 18 g; Refill: 3  6. Need for immunization against influenza - Flu Vaccine QUAD 41moIM (Fluarix, Fluzone & Alfiuria Quad PF)     Patient was given the opportunity to ask questions.  Patient verbalized understanding of the plan and was able to repeat key elements of the plan.   Orders Placed This Encounter  Procedures   Flu Vaccine QUAD 624moM (Fluarix, Fluzone & Alfiuria Quad PF)   TSH   Comprehensive metabolic panel   POCT glucose (manual entry)   POCT glycosylated hemoglobin (Hb A1C)     Requested Prescriptions   Signed Prescriptions Disp Refills   Blood Glucose Monitoring Suppl (ACCU-CHEK GUIDE ME) w/Device KIT 1 kit 0    Sig: 1 Device by Does not apply route daily.   dapagliflozin propanediol (FARXIGA) 5 MG TABS tablet 30 tablet 3    Sig: Take 1 tablet (5 mg total) by mouth daily before breakfast.    Return in about 4 months (around 09/11/2021).  DeKarle PlumberMD, FACP

## 2021-05-13 LAB — COMPREHENSIVE METABOLIC PANEL WITH GFR
ALT: 16 IU/L (ref 0–32)
AST: 19 IU/L (ref 0–40)
Albumin/Globulin Ratio: 1.2 (ref 1.2–2.2)
Albumin: 4.5 g/dL (ref 3.8–4.8)
Alkaline Phosphatase: 122 IU/L — ABNORMAL HIGH (ref 44–121)
BUN/Creatinine Ratio: 12 (ref 12–28)
BUN: 13 mg/dL (ref 8–27)
Bilirubin Total: 0.4 mg/dL (ref 0.0–1.2)
CO2: 22 mmol/L (ref 20–29)
Calcium: 9.7 mg/dL (ref 8.7–10.3)
Chloride: 100 mmol/L (ref 96–106)
Creatinine, Ser: 1.06 mg/dL — ABNORMAL HIGH (ref 0.57–1.00)
Globulin, Total: 3.8 g/dL (ref 1.5–4.5)
Glucose: 122 mg/dL — ABNORMAL HIGH (ref 65–99)
Potassium: 4.3 mmol/L (ref 3.5–5.2)
Sodium: 136 mmol/L (ref 134–144)
Total Protein: 8.3 g/dL (ref 6.0–8.5)
eGFR: 58 mL/min/1.73 — ABNORMAL LOW

## 2021-05-13 LAB — TSH: TSH: 1.71 u[IU]/mL (ref 0.450–4.500)

## 2021-05-17 ENCOUNTER — Telehealth: Payer: Self-pay

## 2021-05-17 NOTE — Telephone Encounter (Signed)
Contacted pt to go over lab results pt is aware and doesn't have any questions or concerns 

## 2021-06-08 DIAGNOSIS — M25562 Pain in left knee: Secondary | ICD-10-CM | POA: Diagnosis not present

## 2021-06-08 DIAGNOSIS — M1712 Unilateral primary osteoarthritis, left knee: Secondary | ICD-10-CM | POA: Diagnosis not present

## 2021-06-19 ENCOUNTER — Other Ambulatory Visit: Payer: Self-pay | Admitting: Internal Medicine

## 2021-06-19 DIAGNOSIS — I10 Essential (primary) hypertension: Secondary | ICD-10-CM

## 2021-06-27 ENCOUNTER — Other Ambulatory Visit: Payer: Self-pay | Admitting: Internal Medicine

## 2021-06-27 DIAGNOSIS — Z1231 Encounter for screening mammogram for malignant neoplasm of breast: Secondary | ICD-10-CM

## 2021-07-14 ENCOUNTER — Other Ambulatory Visit: Payer: Self-pay | Admitting: Internal Medicine

## 2021-07-14 DIAGNOSIS — E890 Postprocedural hypothyroidism: Secondary | ICD-10-CM

## 2021-07-14 NOTE — Telephone Encounter (Signed)
Requested Prescriptions  Pending Prescriptions Disp Refills  . levothyroxine (SYNTHROID) 100 MCG tablet [Pharmacy Med Name: Levothyroxine Sodium 100 MCG Oral Tablet] 90 tablet 0    Sig: TAKE 1 TABLET BY MOUTH ONCE DAILY . APPOINTMENT REQUIRED FOR FUTURE REFILLS     Endocrinology:  Hypothyroid Agents Failed - 07/14/2021  8:42 AM      Failed - TSH needs to be rechecked within 3 months after an abnormal result. Refill until TSH is due.      Passed - TSH in normal range and within 360 days    TSH  Date Value Ref Range Status  05/12/2021 1.710 0.450 - 4.500 uIU/mL Final         Passed - Valid encounter within last 12 months    Recent Outpatient Visits          2 months ago Type 2 diabetes mellitus with morbid obesity ([place])   [place] Ladell Pier, MD   4 months ago Encounter for [employer] annual wellness exam   [place] Karle Plumber B, MD   6 months ago Type 2 diabetes mellitus with morbid obesity [facility])   [place] Ladell Pier, MD   11 months ago Type 2 diabetes mellitus with morbid obesity [facility])   [place], Darrick Penna, MD   1 year ago Type 2 diabetes mellitus with morbid obesity [facility])   [place], MD      Future Appointments            In 2 months Wynetta Emery Dalbert Batman, MD [employer]

## 2021-07-16 ENCOUNTER — Other Ambulatory Visit: Payer: Self-pay | Admitting: Internal Medicine

## 2021-07-16 DIAGNOSIS — E890 Postprocedural hypothyroidism: Secondary | ICD-10-CM

## 2021-07-16 NOTE — Telephone Encounter (Signed)
last RF 07/14/21 #90

## 2021-07-25 ENCOUNTER — Other Ambulatory Visit: Payer: Self-pay

## 2021-07-25 ENCOUNTER — Ambulatory Visit
Admission: RE | Admit: 2021-07-25 | Discharge: 2021-07-25 | Disposition: A | Discharge status: Home / Self Care | Payer: Medicare HMO | Source: Ambulatory Visit | Attending: Internal Medicine | Admitting: Internal Medicine

## 2021-07-25 DIAGNOSIS — Z1231 Encounter for screening mammogram for malignant neoplasm of breast: Secondary | ICD-10-CM

## 2021-07-27 ENCOUNTER — Telehealth: Payer: Self-pay

## 2021-07-27 ENCOUNTER — Other Ambulatory Visit: Payer: Self-pay | Admitting: Internal Medicine

## 2021-07-27 DIAGNOSIS — R928 Other abnormal and inconclusive findings on diagnostic imaging of breast: Secondary | ICD-10-CM

## 2021-07-27 NOTE — Telephone Encounter (Signed)
Contacted pt to go over lab results pt didn't answer vm full  Sent a CRM and forward labs to NT to give pt labs when they call back    Looks like pt already has an appt schedule already for follow up

## 2021-08-08 DIAGNOSIS — M1712 Unilateral primary osteoarthritis, left knee: Secondary | ICD-10-CM | POA: Diagnosis not present

## 2021-08-17 ENCOUNTER — Other Ambulatory Visit: Payer: Self-pay | Admitting: Internal Medicine

## 2021-08-17 ENCOUNTER — Ambulatory Visit
Admission: RE | Admit: 2021-08-17 | Discharge: 2021-08-17 | Disposition: A | Discharge status: Home / Self Care | Payer: Medicare HMO | Source: Ambulatory Visit | Attending: Internal Medicine | Admitting: Internal Medicine

## 2021-08-17 ENCOUNTER — Other Ambulatory Visit: Payer: Self-pay

## 2021-08-17 DIAGNOSIS — I10 Essential (primary) hypertension: Secondary | ICD-10-CM

## 2021-08-17 DIAGNOSIS — R590 Localized enlarged lymph nodes: Secondary | ICD-10-CM | POA: Diagnosis not present

## 2021-08-17 DIAGNOSIS — R928 Other abnormal and inconclusive findings on diagnostic imaging of breast: Secondary | ICD-10-CM

## 2021-08-17 DIAGNOSIS — E1169 Type 2 diabetes mellitus with other specified complication: Secondary | ICD-10-CM

## 2021-08-17 NOTE — Progress Notes (Signed)
Let patient know that the area of concern in the right axilla that was seen on the mammogram was a small lymph node.  Will need repeat mammogram in 1 year.

## 2021-08-18 ENCOUNTER — Ambulatory Visit: Payer: Self-pay

## 2021-08-18 MED ORDER — FLUCONAZOLE 150 MG PO TABS: 150.0000 mg | ORAL_TABLET | Freq: Every day | ORAL | 0 refills | Status: DC

## 2021-08-18 NOTE — Telephone Encounter (Signed)
Pt. Reports she started having vaginal itching inside x 3 days ago. No discharge. No other symptoms.Feels like it is caused by her [place]. No availability today in the practice. Please advise pt.     Answer Assessment - Initial Assessment Questions 1. SYMPTOM: "What's the main symptom you're concerned about?" (e.g., pain, itching, dryness)     Itching 2. LOCATION: "Where is the  itching located?" (e.g., inside/outside, left/right)     Inside 3. ONSET: "When did the  itching  start?"     3 days ago 4. PAIN: "Is there any pain?" If Yes, ask: "How bad is it?" (Scale: 1-10; mild, moderate, severe)     No 5. ITCHING: "Is there any itching?" If Yes, ask: "How bad is it?" (Scale: 1-10; mild, moderate, severe)     9 6. CAUSE: "What do you think is causing the discharge?" "Have you had the same problem before? What happened then?"     Farxiga 7. OTHER SYMPTOMS: "Do you have any other symptoms?" (e.g., fever, itching, vaginal bleeding, pain with urination, injury to genital area, vaginal foreign body)     No 8. PREGNANCY: "Is there any chance you are pregnant?" "When was your last menstrual period?"     No  Protocols used: Vaginal Symptoms-A-AH

## 2021-08-18 NOTE — Addendum Note (Signed)
Addended by: Karle Plumber B on: 08/18/2021 10:32 PM   Modules accepted: Orders

## 2021-08-19 ENCOUNTER — Telehealth: Payer: Self-pay

## 2021-08-19 ENCOUNTER — Telehealth: Payer: Self-pay | Admitting: Internal Medicine

## 2021-08-19 NOTE — Telephone Encounter (Signed)
Called pt stated she wants to change the [place] and she do not want to continue to take pills for Yeast  infections. Thank you

## 2021-08-19 NOTE — Telephone Encounter (Signed)
-----   Message from Carilyn Goodpasture, RN sent at 08/19/2021 11:42 AM EST -----  ----- Message ----- From: Ladell Pier, MD Sent: 08/17/2021   6:18 PM EST To: Jackelyn Knife, RMA  Let patient know that the area of concern in the right axilla that was seen on the mammogram was a small lymph node.  Will need repeat mammogram in 1 year.

## 2021-08-19 NOTE — Telephone Encounter (Signed)
Pt called about her message from yesterday / I advised her of Dr. Bard Herbert message and the Rx sent to the pharmacy / Pt stated she has stopped taking the [place] and wanted to request that Dr. Wynetta Emery change this medication/ pt stated she didn't want to take the [place] again and get another possible yeast infection / she would like to discuss with DR. Wynetta Emery what other med she can take/ please advise   If no answer at home number please call [phone](cell#)

## 2021-08-19 NOTE — Telephone Encounter (Signed)
Pt was called and is aware of results, DOB was confirmed.  ?

## 2021-08-21 MED ORDER — NYSTATIN-TRIAMCINOLONE 100000-0.1 UNIT/GM-% EX CREA: 1.0000 "application " | TOPICAL_CREAM | Freq: Two times a day (BID) | CUTANEOUS | 0 refills | Status: DC

## 2021-08-21 MED ORDER — FLUCONAZOLE 200 MG PO TABS: 200.0000 mg | ORAL_TABLET | Freq: Every day | ORAL | 0 refills | Status: DC

## 2021-08-21 NOTE — Telephone Encounter (Signed)
Phone call placed to patient this afternoon.  Patient states that she is having a lot of vaginal itching inside and outside the vagina that also extends around to the rectal area.  She feels it is the [place].  I told her that one of the side effects of the [place] started can cause genital yeast infection.  She has already stopped the [place].  She has taken the Diflucan 150 mg daily for 2 days.  She thinks she took the last dose 2 days ago.  She is still having itching.  No burning with urination.  Patient states that she will be taking the train to [place] to visit family on the 13th of this month and will be gone until the beginning of the new year. Advised patient that I will send a prescription to the pharmacy for a cream called Mycolog that she should use on the outside of the vagina that is the labia.  She can also use it around the rectum if she is having itching in that area to.  Advised not to put cream into the vagina or her rectum.  I will also send 1 more pill of Diflucan but this time the 200 mg dose.  Advised patient that if this does not resolve within 2 days, she should call us back for an urgent care appointment. We discussed adding another diabetic medication like Rybelsus versus holding off until she sees me in January for her routine follow-up to see what her A1c looks like.  Patient wants to hold off.  She will continue to take glipizide and Januvia in the meantime.

## 2021-08-25 ENCOUNTER — Ambulatory Visit
Admission: RE | Admit: 2021-08-25 | Discharge: 2021-08-25 | Disposition: A | Discharge status: Home / Self Care | Payer: Medicare HMO | Source: Ambulatory Visit | Attending: Internal Medicine | Admitting: Internal Medicine

## 2021-08-25 ENCOUNTER — Other Ambulatory Visit: Payer: Self-pay

## 2021-08-25 DIAGNOSIS — Z780 Asymptomatic menopausal state: Secondary | ICD-10-CM | POA: Diagnosis not present

## 2021-09-17 ENCOUNTER — Ambulatory Visit (HOSPITAL_COMMUNITY)
Admission: EM | Admit: 2021-09-17 | Discharge: 2021-09-17 | Disposition: A | Discharge status: Home / Self Care | Payer: Medicare HMO | Attending: Urgent Care | Admitting: Urgent Care

## 2021-09-17 ENCOUNTER — Other Ambulatory Visit: Payer: Self-pay

## 2021-09-17 ENCOUNTER — Encounter (HOSPITAL_COMMUNITY): Payer: Self-pay

## 2021-09-17 ENCOUNTER — Ambulatory Visit (INDEPENDENT_AMBULATORY_CARE_PROVIDER_SITE_OTHER): Payer: Medicare HMO

## 2021-09-17 DIAGNOSIS — R059 Cough, unspecified: Secondary | ICD-10-CM | POA: Diagnosis not present

## 2021-09-17 DIAGNOSIS — E118 Type 2 diabetes mellitus with unspecified complications: Secondary | ICD-10-CM

## 2021-09-17 DIAGNOSIS — R051 Acute cough: Secondary | ICD-10-CM | POA: Diagnosis not present

## 2021-09-17 DIAGNOSIS — B349 Viral infection, unspecified: Secondary | ICD-10-CM

## 2021-09-17 DIAGNOSIS — Z79899 Other long term (current) drug therapy: Secondary | ICD-10-CM | POA: Diagnosis not present

## 2021-09-17 DIAGNOSIS — Z79621 Long term (current) use of calcineurin inhibitor: Secondary | ICD-10-CM | POA: Insufficient documentation

## 2021-09-17 DIAGNOSIS — Z7982 Long term (current) use of aspirin: Secondary | ICD-10-CM | POA: Insufficient documentation

## 2021-09-17 DIAGNOSIS — Z79890 Hormone replacement therapy (postmenopausal): Secondary | ICD-10-CM | POA: Insufficient documentation

## 2021-09-17 DIAGNOSIS — Z7984 Long term (current) use of oral hypoglycemic drugs: Secondary | ICD-10-CM | POA: Insufficient documentation

## 2021-09-17 DIAGNOSIS — E119 Type 2 diabetes mellitus without complications: Secondary | ICD-10-CM | POA: Diagnosis not present

## 2021-09-17 DIAGNOSIS — Z20822 Contact with and (suspected) exposure to covid-19: Secondary | ICD-10-CM | POA: Diagnosis not present

## 2021-09-17 DIAGNOSIS — J4530 Mild persistent asthma, uncomplicated: Secondary | ICD-10-CM

## 2021-09-17 LAB — POC INFLUENZA A AND B ANTIGEN (URGENT CARE ONLY)
INFLUENZA A ANTIGEN, POC: NEGATIVE
INFLUENZA B ANTIGEN, POC: NEGATIVE

## 2021-09-17 MED ORDER — BENZONATATE 100 MG PO CAPS: 100.0000 mg | ORAL_CAPSULE | Freq: Three times a day (TID) | ORAL | 0 refills | Status: DC | PRN

## 2021-09-17 MED ORDER — PREDNISONE 50 MG PO TABS: 50.0000 mg | ORAL_TABLET | Freq: Every day | ORAL | 0 refills | Status: DC

## 2021-09-17 MED ORDER — CETIRIZINE HCL 10 MG PO TABS: 10.0000 mg | ORAL_TABLET | Freq: Every day | ORAL | 0 refills | Status: DC

## 2021-09-17 MED ORDER — PROMETHAZINE-DM 6.25-15 MG/5ML PO SYRP: 5.0000 mL | ORAL_SOLUTION | Freq: Every evening | ORAL | 0 refills | Status: DC | PRN

## 2021-09-17 NOTE — ED Provider Notes (Signed)
[facility]   MRN: [id number] DOB: 1956/06/24  Subjective:   Kayla Bean is a 65 y.o. female presenting for 4 day history of acute onset chills, chest pain from coughing. Cough is productive, also has nasal congestion. Has a history of asthma, allergies, eustachian tube dysfunction.  Has been using her albuterol inhaler. No smoking.  No current facility-administered medications for this encounter.  Current Outpatient Medications:    albuterol (VENTOLIN HFA) 108 (90 Base) MCG/ACT inhaler, INHALE 2 PUFFS BY MOUTH EVERY 6 HOURS AS NEEDED FOR WHEEZING FOR SHORTNESS OF BREATH, Disp: 18 g, Rfl: 3   amLODipine (NORVASC) 10 MG tablet, Take 1 tablet by mouth once daily, Disp: 90 tablet, Rfl: 0   aspirin 81 MG tablet, Take 1 tablet (81 mg total) daily by mouth., Disp: 90 tablet, Rfl: 0   Blood Glucose Monitoring Suppl (ACCU-CHEK GUIDE ME) w/Device KIT, 1 Device by Does not apply route daily., Disp: 1 kit, Rfl: 0   brimonidine-timolol (COMBIGAN) 0.2-0.5 % ophthalmic solution, Place 1 drop into both eyes every 12 (twelve) hours., Disp: , Rfl:    cycloSPORINE (RESTASIS) 0.05 % ophthalmic emulsion, Place 1 drop into both eyes 2 (two) times daily., Disp: , Rfl:    dapagliflozin propanediol (FARXIGA) 5 MG TABS tablet, Take 1 tablet (5 mg total) by mouth daily before breakfast., Disp: 30 tablet, Rfl: 3   fluconazole (DIFLUCAN) 200 MG tablet, Take 1 tablet (200 mg total) by mouth daily., Disp: 1 tablet, Rfl: 0   glipiZIDE (GLUCOTROL) 10 MG tablet, TAKE 1 TABLET BY MOUTH TWICE DAILY BEFORE A MEAL, Disp: 180 tablet, Rfl: 0   glucose blood (ACCU-CHEK GUIDE) test strip, Use as instructed, Disp: 100 each, Rfl: 12   JANUVIA 100 MG tablet, Take 1 tablet by mouth once daily, Disp: 90 tablet, Rfl: 0   Lancet Devices (ACCU-CHEK SOFTCLIX) lancets, Use as instructed for 3 times daily testing of blood glucose, Disp: 1 each, Rfl: 0   Lancets Misc. (ACCU-CHEK FASTCLIX LANCET) KIT, Use as  directed, Disp: 1 kit, Rfl: 12   latanoprost (XALATAN) 0.005 % ophthalmic solution, Place 1 drop into both eyes at bedtime. , Disp: , Rfl:    levothyroxine (SYNTHROID) 100 MCG tablet, Take 1 tablet (100 mcg total) by mouth daily before breakfast., Disp: 90 tablet, Rfl: 0   lisinopril (ZESTRIL) 20 MG tablet, Take 1 tablet by mouth once daily, Disp: 90 tablet, Rfl: 1   nystatin-triamcinolone (MYCOLOG II) cream, Apply 1 application topically 2 (two) times daily. Use on affected area twice a day for 4-5 days.  Do not insert into the vagina or rectum., Disp: 30 g, Rfl: 0   pravastatin (PRAVACHOL) 40 MG tablet, Take 1 tablet by mouth once daily, Disp: 90 tablet, Rfl: 0   Allergies  Allergen Reactions   Latex Hives   Metformin And Related     Hair loss   Penicillins Hives   Warfarin Sodium Hives    REACTION: Got rash when took one pill of coumadin  related to knee replacement surgery in 2007.   Iodinated Contrast Media Other (See Comments)    PT STATES SHE HAD AN ALLERGIC REACTION OF BLISTERS ON HANDS AND FEET 2 DAYS AFTER CT SCAN W/ IV CONTRAST INJECTION, DR. MANSELL SUGGESTS 13 HR PREP//A.C.    Past Medical History:  Diagnosis Date   Arthritis    Asthma    Diabetes mellitus    Heart murmur    Hemorrhoids    Hypertension  POAG (primary open-angle glaucoma)    BL - followed by Dr. Schuyler Amor   Thyroid disease    hypothyroidism     Past Surgical History:  Procedure Laterality Date   ABDOMINAL HYSTERECTOMY     CHOLECYSTECTOMY N/A 02/24/2019   Procedure: LAPAROSCOPIC CHOLECYSTECTOMY WITH INTRAOPERATIVE CHOLANGIOGRAM;  Surgeon: Donnie Mesa, MD;  Location: [facility];  Service: General;  Laterality: N/A;   KNEE SURGERY     left   THYROIDECTOMY     TOTAL KNEE ARTHROPLASTY     right knee    Family History  Problem Relation Age of Onset   Asthma Other    Cancer Other    Hypertension Other    Diabetes Other     Social History   Tobacco Use   Smoking status: Former    Types:  Cigarettes    Quit date: 09/18/2001    Years since quitting: 20.0   Smokeless tobacco: Never  Substance Use Topics   Alcohol use: Yes   Drug use: No    ROS   Objective:   Vitals: BP (!) 133/57    Pulse 73    Temp 98.1 F (36.7 C) (Oral)    Resp 18    SpO2 98%   Physical Exam Constitutional:      General: She is not in acute distress.    Appearance: Normal appearance. She is well-developed. She is obese. She is not ill-appearing, toxic-appearing or diaphoretic.  HENT:     Head: Normocephalic and atraumatic.     Right Ear: External ear normal.     Left Ear: External ear normal.     Nose: Congestion and rhinorrhea present.     Mouth/Throat:     Mouth: Mucous membranes are moist.     Pharynx: No oropharyngeal exudate.  Eyes:     General: No scleral icterus.       Right eye: No discharge.        Left eye: No discharge.     Extraocular Movements: Extraocular movements intact.  Cardiovascular:     Rate and Rhythm: Normal rate and regular rhythm.     Pulses: Normal pulses.     Heart sounds: Normal heart sounds. No murmur heard.   No friction rub. No gallop.  Pulmonary:     Effort: Pulmonary effort is normal. No respiratory distress.     Breath sounds: No stridor. Wheezing (throughout) present. No rhonchi or rales.  Skin:    General: Skin is warm and dry.     Findings: No rash.  Neurological:     Mental Status: She is alert and oriented to person, place, and time.  Psychiatric:        Mood and Affect: Mood normal.        Behavior: Behavior normal.        Thought Content: Thought content normal.    Results for orders placed or performed during the hospital encounter of 09/17/21 (from the past 24 hour(s))  POC Influenza A & B Ag (Urgent Care)     Status: None   Collection Time: 09/17/21  5:46 PM  Result Value Ref Range   INFLUENZA A ANTIGEN, POC NEGATIVE NEGATIVE   INFLUENZA B ANTIGEN, POC NEGATIVE NEGATIVE    Assessment and Plan :   PDMP not reviewed this  encounter.  1. Acute viral syndrome   2. Acute cough   3. Type 2 diabetes mellitus treated without insulin ([place])   4. Mild persistent asthma without complication    X-ray over-read  was pending at time of discharge, recommended follow up with only abnormal results. Otherwise will not call for negative over-read. Patient was in agreement.  In light of her asthma and physical exam findings, will be using an oral prednisone course.  Suspect an acute viral syndrome, COVID-19.  At this stage, she would not benefit from using COVID antivirals given the timeline of her illness.  Flu test was negative.  Recommend supportive care otherwise. Counseled patient on potential for adverse effects with medications prescribed/recommended today, ER and return-to-clinic precautions discussed, patient verbalized understanding.    Jaynee Eagles, [place] 09/17/21 [id number]

## 2021-09-17 NOTE — Discharge Instructions (Addendum)
We will notify you of your test results as they arrive and may take between 48-72 hours.  I encourage you to sign up for MyChart if you have not already done so as this can be the easiest way for [place] to communicate results to you online or through a phone app.  Generally, we only contact you if it is a positive test result.  In the meantime, if you develop worsening symptoms including fever, chest pain, shortness of breath despite our current treatment plan then please report to the emergency room as this may be a sign of worsening status from possible viral infection.  Otherwise, we will manage this as a viral syndrome. For sore throat or cough try using a honey-based tea. Use 3 teaspoons of honey with juice squeezed from half lemon. Place shaved pieces of ginger into 1/2-1 cup of water and warm over stove top. Then mix the ingredients and repeat every 4 hours as needed. Please take Tylenol 500mg -650mg  every 6 hours for aches and pains, fevers. Hydrate very well with at least 2 liters of water. Eat light meals such as soups to replenish electrolytes and soft fruits, veggies. Start an antihistamine like Zyrtec for postnasal drainage, sinus congestion.  You can take this together with the prednisone to help you with the respiratory symptoms.  This is very necessary because of your asthma.  Regarding your x-ray, I will contact you tomorrow only if we have to discuss abnormal results.  Otherwise if your x-ray looks good you will hear from you.

## 2021-09-17 NOTE — ED Triage Notes (Signed)
Pt presents with cold chills, chest pain when coughing X   States she has nasal congestion and c/o not being able to sleep due to the SOB.   States she has an on and off fever.

## 2021-09-18 LAB — SARS CORONAVIRUS 2 (TAT 6-24 HRS): SARS Coronavirus 2: NEGATIVE

## 2021-09-27 ENCOUNTER — Encounter: Payer: Self-pay | Admitting: Internal Medicine

## 2021-09-27 ENCOUNTER — Ambulatory Visit: Payer: Medicare HMO | Attending: Internal Medicine | Admitting: Internal Medicine

## 2021-09-27 VITALS — BP 135/69

## 2021-09-27 DIAGNOSIS — E785 Hyperlipidemia, unspecified: Secondary | ICD-10-CM | POA: Diagnosis not present

## 2021-09-27 DIAGNOSIS — B9789 Other viral agents as the cause of diseases classified elsewhere: Secondary | ICD-10-CM | POA: Diagnosis not present

## 2021-09-27 DIAGNOSIS — E890 Postprocedural hypothyroidism: Secondary | ICD-10-CM | POA: Diagnosis not present

## 2021-09-27 DIAGNOSIS — E1169 Type 2 diabetes mellitus with other specified complication: Secondary | ICD-10-CM | POA: Diagnosis not present

## 2021-09-27 DIAGNOSIS — I10 Essential (primary) hypertension: Secondary | ICD-10-CM | POA: Diagnosis not present

## 2021-09-27 DIAGNOSIS — J040 Acute laryngitis: Secondary | ICD-10-CM | POA: Diagnosis not present

## 2021-09-27 DIAGNOSIS — J4520 Mild intermittent asthma, uncomplicated: Secondary | ICD-10-CM

## 2021-09-27 MED ORDER — LEVOTHYROXINE SODIUM 100 MCG PO TABS: 100.0000 ug | ORAL_TABLET | Freq: Every day | ORAL | 1 refills | Status: DC

## 2021-09-27 MED ORDER — ALBUTEROL SULFATE 108 (90 BASE) MCG/ACT IN AEPB: 2.0000 | INHALATION_SPRAY | Freq: Four times a day (QID) | RESPIRATORY_TRACT | 5 refills | Status: DC | PRN

## 2021-09-27 MED ORDER — PRAVASTATIN SODIUM 40 MG PO TABS: ORAL_TABLET | ORAL | 0 refills | Status: DC

## 2021-09-27 MED ORDER — SITAGLIPTIN PHOSPHATE 100 MG PO TABS: 100.0000 mg | ORAL_TABLET | Freq: Every day | ORAL | 1 refills | Status: DC

## 2021-09-27 MED ORDER — LISINOPRIL 20 MG PO TABS: 20.0000 mg | ORAL_TABLET | Freq: Every day | ORAL | 1 refills | Status: DC

## 2021-09-27 MED ORDER — GLIPIZIDE 10 MG PO TABS: 10.0000 mg | ORAL_TABLET | Freq: Two times a day (BID) | ORAL | 1 refills | Status: DC

## 2021-09-27 MED ORDER — AMLODIPINE BESYLATE 10 MG PO TABS: 10.0000 mg | ORAL_TABLET | Freq: Every day | ORAL | 1 refills | Status: DC

## 2021-09-27 NOTE — Progress Notes (Signed)
Patient ID: Kayla Bean, female   DOB: 04/14/1956, 66 y.o.   MRN: [id number] Virtual Visit via Telephone Note  I connected with Kayla Bean on 09/27/2021 at 3:38 p.m by telephone and verified that I am speaking with the correct person using two identifiers  Location: Patient: home Provider: office  Participants: Myself Patient   I discussed the limitations, risks, security and privacy concerns of performing an evaluation and management service by telephone and the availability of in person appointments. I also discussed with the patient that there may be a patient responsible charge related to this service. The patient expressed understanding and agreed to proceed.   History of Present Illness: Patient with history of diabetes, hypertension, HL, post thyroidectomy hypothyroidism (1973, noncancerous), asthma, CKD stage III, OA knee and obesity.  This is for chronic ds management.  Seen in ER 12/31/222 with 4 days of chest and head congestion, cough, chills and low grade fever.  Patient tells me she was in [place] visiting her daughter and family.  Her grandson got sick with respiratory illness while she was there.  COVID and flu test were negative in ER and CXR negative.  He was Dx with acute viral illness and send home with Prednisone, Tessalon Perles, Zyrtec.  Reports appetite has been down since the start of this.  Appetite now coming back.  Now eating soups.  Weight is down by 8 to 9 pounds since she last saw me in August.  Weight at that time was 252 pounds. She has completed the prednisone.  She feels that the type of albuterol inhaler that she currently has does not work as well as the albuterol that she used to get in the past.   -The cough and chest congestion have resolved.  What has persisted his hoarseness.  She has been drinking hot tea with lemon and hot tea with honey.  She denies sore throat.  DM: I had her discontinue Wilder Glade several weeks ago due to recurrent  vaginal yeast symptoms.  Those symptoms have resolved.  She has continued on Januvia and Glucotrol. She has not been checking BS regularly since being sick Today BS this a.m was 160   HTN:  BP was 135/69 today. Today was the 1st time she has checked in wks No CP Reports compliance with taking amlodipine and lisinopril.  HL:  taking and tolerating Pravachol  Hypothyroidism: Reports consistency with taking levothyroxine.  Outpatient Encounter Medications as of 09/27/2021  Medication Sig   albuterol (VENTOLIN HFA) 108 (90 Base) MCG/ACT inhaler INHALE 2 PUFFS BY MOUTH EVERY 6 HOURS AS NEEDED FOR WHEEZING FOR SHORTNESS OF BREATH   amLODipine (NORVASC) 10 MG tablet Take 1 tablet by mouth once daily   aspirin 81 MG tablet Take 1 tablet (81 mg total) daily by mouth.   benzonatate (TESSALON) 100 MG capsule Take 1-2 capsules (100-200 mg total) by mouth 3 (three) times daily as needed for cough.   Blood Glucose Monitoring Suppl (ACCU-CHEK GUIDE ME) w/Device KIT 1 Device by Does not apply route daily.   brimonidine-timolol (COMBIGAN) 0.2-0.5 % ophthalmic solution Place 1 drop into both eyes every 12 (twelve) hours.   cetirizine (ZYRTEC ALLERGY) 10 MG tablet Take 1 tablet (10 mg total) by mouth daily.   cycloSPORINE (RESTASIS) 0.05 % ophthalmic emulsion Place 1 drop into both eyes 2 (two) times daily.   dapagliflozin propanediol (FARXIGA) 5 MG TABS tablet Take 1 tablet (5 mg total) by mouth daily before breakfast.   fluconazole (DIFLUCAN) 200  MG tablet Take 1 tablet (200 mg total) by mouth daily.   glipiZIDE (GLUCOTROL) 10 MG tablet TAKE 1 TABLET BY MOUTH TWICE DAILY BEFORE A MEAL   glucose blood (ACCU-CHEK GUIDE) test strip Use as instructed   JANUVIA 100 MG tablet Take 1 tablet by mouth once daily   Lancet Devices (ACCU-CHEK SOFTCLIX) lancets Use as instructed for 3 times daily testing of blood glucose   Lancets Misc. (ACCU-CHEK FASTCLIX LANCET) KIT Use as directed   latanoprost (XALATAN) 0.005 %  ophthalmic solution Place 1 drop into both eyes at bedtime.    levothyroxine (SYNTHROID) 100 MCG tablet Take 1 tablet (100 mcg total) by mouth daily before breakfast.   lisinopril (ZESTRIL) 20 MG tablet Take 1 tablet by mouth once daily   nystatin-triamcinolone (MYCOLOG II) cream Apply 1 application topically 2 (two) times daily. Use on affected area twice a day for 4-5 days.  Do not insert into the vagina or rectum.   pravastatin (PRAVACHOL) 40 MG tablet Take 1 tablet by mouth once daily   predniSONE (DELTASONE) 50 MG tablet Take 1 tablet (50 mg total) by mouth daily with breakfast.   promethazine-dextromethorphan (PROMETHAZINE-DM) 6.25-15 MG/5ML syrup Take 5 mLs by mouth at bedtime as needed for cough.   No facility-administered encounter medications on file as of 09/27/2021.    Observations/Objective: Patient with mild audible hoarseness.  No other direct observation done as this was a telephone visit. Lab Results  Component Value Date   HGBA1C 7.9 (A) 05/12/2021     Chemistry      Component Value Date/Time   NA 136 05/12/2021 1645   K 4.3 05/12/2021 1645   CL 100 05/12/2021 1645   CO2 22 05/12/2021 1645   BUN 13 05/12/2021 1645   CREATININE 1.06 (H) 05/12/2021 1645   CREATININE 1.29 (H) 07/19/2016 1105      Component Value Date/Time   CALCIUM 9.7 05/12/2021 1645   ALKPHOS 122 (H) 05/12/2021 1645   AST 19 05/12/2021 1645   ALT 16 05/12/2021 1645   BILITOT 0.4 05/12/2021 1645     Lab Results  Component Value Date   WBC 9.5 10/22/2019   HGB 13.1 10/22/2019   HCT 39.1 10/22/2019   MCV 92.0 10/22/2019   PLT 221 10/22/2019     Assessment and Plan: 1. Viral laryngitis Patient is recovering from acute respiratory viral illness.  Other symptoms have resolved except for the hoarseness.  I recommend voice rest and continue drinking warm to hot fluids like tea.  If this does not resolve in the next 1 to 2 weeks, we will refer her to ENT.  2. Type 2 diabetes mellitus with  morbid obesity ([place]) Level of control unknown as patient has not been checking blood sugars consistently recently.  She will come to the lab to have A1c.  She will continue Januvia and glipizide for now. - Hemoglobin A1c; Future - Microalbumin / creatinine urine ratio; Future - glipiZIDE (GLUCOTROL) 10 MG tablet; Take 1 tablet (10 mg total) by mouth 2 (two) times daily before a meal.  Dispense: 180 tablet; Refill: 1 - sitaGLIPtin (JANUVIA) 100 MG tablet; Take 1 tablet (100 mg total) by mouth daily.  Dispense: 90 tablet; Refill: 1  3. Essential hypertension Reported blood pressure reading close to goal.  Continue amlodipine and lisinopril. - amLODipine (NORVASC) 10 MG tablet; Take 1 tablet (10 mg total) by mouth daily.  Dispense: 90 tablet; Refill: 1 - lisinopril (ZESTRIL) 20 MG tablet; Take 1 tablet (20 mg  total) by mouth daily.  Dispense: 90 tablet; Refill: 1  4. Hyperlipidemia associated with type 2 diabetes mellitus ([place]) - Lipid panel; Future - pravastatin (PRAVACHOL) 40 MG tablet; Take 1 tablet by mouth once daily  Dispense: 90 tablet; Refill: 0  5. Postoperative hypothyroidism - TSH; Future - levothyroxine (SYNTHROID) 100 MCG tablet; Take 1 tablet (100 mcg total) by mouth daily before breakfast.  Dispense: 90 tablet; Refill: 1  6. Mild intermittent asthma without complication Try to get her ProAir - Albuterol Sulfate (PROAIR RESPICLICK) 027 (90 Base) MCG/ACT AEPB; Inhale 2 puffs into the lungs every 6 (six) hours as needed.  Dispense: 1 each; Refill: 5   Follow Up Instructions: 4 mths and PRN   I discussed the assessment and treatment plan with the patient. The patient was provided an opportunity to ask questions and all were answered. The patient agreed with the plan and demonstrated an understanding of the instructions.   The patient was advised to call back or seek an in-person evaluation if the symptoms worsen or if the condition fails to improve as anticipated.  I  Spent 21  minutes on this telephone encounter  Karle Plumber, MD

## 2021-09-28 ENCOUNTER — Telehealth: Payer: Self-pay

## 2021-09-28 NOTE — Telephone Encounter (Signed)
-----   Message from Ladell Pier, MD sent at 09/27/2021  5:50 PM EST ----- Give follow-up appointment in 4 months in person.

## 2021-09-28 NOTE — Telephone Encounter (Signed)
Pt has been scheduled and reminder has been mailed.  

## 2021-11-30 DIAGNOSIS — M1712 Unilateral primary osteoarthritis, left knee: Secondary | ICD-10-CM | POA: Diagnosis not present

## 2021-11-30 DIAGNOSIS — G5602 Carpal tunnel syndrome, left upper limb: Secondary | ICD-10-CM | POA: Diagnosis not present

## 2021-12-31 ENCOUNTER — Other Ambulatory Visit: Payer: Self-pay | Admitting: Internal Medicine

## 2021-12-31 DIAGNOSIS — E1169 Type 2 diabetes mellitus with other specified complication: Secondary | ICD-10-CM

## 2022-01-02 NOTE — Telephone Encounter (Signed)
Requested medication (s) are due for refill today:   Yes ? ?Requested medication (s) are on the active medication list:   Yes ? ?Future visit scheduled:   Yes in 3 wks ? ? ?Last ordered: 09/27/2021 #90, 0 refills ? ?Returned because per protocol las are overdue  ? ?Requested Prescriptions  ?Pending Prescriptions Disp Refills  ? pravastatin (PRAVACHOL) 40 MG tablet [Pharmacy Med Name: Pravastatin Sodium 40 MG Oral Tablet] 90 tablet 0  ?  Sig: Take 1 tablet by mouth once daily  ?  ? Cardiovascular:  Antilipid - Statins Failed - 12/31/2021 10:47 AM  ?  ?  Failed - Lipid Panel in normal range within the last 12 months  ?  Cholesterol, Total  ?Date Value Ref Range Status  ?07/08/2020 125 100 - 199 mg/dL Final  ? ?LDL Chol Calc (NIH)  ?Date Value Ref Range Status  ?07/08/2020 68 0 - 99 mg/dL Final  ? ?HDL  ?Date Value Ref Range Status  ?07/08/2020 39 (L) >39 mg/dL Final  ? ?Triglycerides  ?Date Value Ref Range Status  ?07/08/2020 96 0 - 149 mg/dL Final  ? ?  ?  ?  Passed - Patient is not pregnant  ?  ?  Passed - Valid encounter within last 12 months  ?  Recent Outpatient Visits   ? ?      ? 3 months ago Viral laryngitis  ? [place] Ladell Pier, MD  ? 7 months ago Type 2 diabetes mellitus with morbid obesity ([place])  ? [place] Ladell Pier, MD  ? 10 months ago Encounter for [employer] annual wellness exam  ? [place] Ladell Pier, MD  ? 1 year ago Type 2 diabetes mellitus with morbid obesity ([place])  ? [place] Ladell Pier, MD  ? 1 year ago Type 2 diabetes mellitus with morbid obesity ([place])  ? [facility], Darrick Penna, MD  ? ?  ?  ?Future Appointments   ? ?        ? In 3 weeks Ladell Pier, MD [place]  ? ?  ? ? ?  ?  ?  ? ?

## 2022-01-03 ENCOUNTER — Other Ambulatory Visit: Payer: Self-pay | Admitting: Internal Medicine

## 2022-01-03 DIAGNOSIS — J4540 Moderate persistent asthma, uncomplicated: Secondary | ICD-10-CM

## 2022-01-03 NOTE — Telephone Encounter (Signed)
Requested medications are due for refill today.  unsure ? ?Requested medications are on the active medications list.  no ? ?Last refill. 01/07/2021  ? ?Future visit scheduled.   yes ? ?Notes to clinic.  Medication was discontinued 03/05/2021. ? ? ? ?Requested Prescriptions  ?Pending Prescriptions Disp Refills  ? albuterol (VENTOLIN HFA) 108 (90 Base) MCG/ACT inhaler [Pharmacy Med Name: Albuterol Sulfate HFA 108 (90 Base) MCG/ACT Inhalation Aerosol Solution] 18 g 0  ?  Sig: INHALE 2 PUFFS BY MOUTH EVERY 6 HOURS AS NEEDED FOR WHEEZING FOR SHORTNESS OF BREATH  ?  ? Pulmonology:  Beta Agonists 2 Passed - 01/03/2022  1:01 PM  ?  ?  Passed - Last BP in normal range  ?  BP Readings from Last 1 Encounters:  ?09/27/21 135/69  ?  ?  ?  ?  Passed - Last Heart Rate in normal range  ?  Pulse Readings from Last 1 Encounters:  ?09/17/21 73  ?  ?  ?  ?  Passed - Valid encounter within last 12 months  ?  Recent Outpatient Visits   ? ?      ? 3 months ago Viral laryngitis  ? [place] Ladell Pier, MD  ? 7 months ago Type 2 diabetes mellitus with morbid obesity ([place])  ? [place] Ladell Pier, MD  ? 10 months ago Encounter for [employer] annual wellness exam  ? [place] Ladell Pier, MD  ? 1 year ago Type 2 diabetes mellitus with morbid obesity ([place])  ? [place] Ladell Pier, MD  ? 1 year ago Type 2 diabetes mellitus with morbid obesity ([place])  ? [facility], Darrick Penna, MD  ? ?  ?  ?Future Appointments   ? ?        ? In 3 weeks Ladell Pier, MD [place]  ? ?  ? ? ?  ?  ?  ?  ?

## 2022-01-11 DIAGNOSIS — M1712 Unilateral primary osteoarthritis, left knee: Secondary | ICD-10-CM | POA: Diagnosis not present

## 2022-01-13 DIAGNOSIS — H16223 Keratoconjunctivitis sicca, not specified as Sjogren's, bilateral: Secondary | ICD-10-CM | POA: Diagnosis not present

## 2022-01-13 DIAGNOSIS — H401111 Primary open-angle glaucoma, right eye, mild stage: Secondary | ICD-10-CM | POA: Diagnosis not present

## 2022-01-13 DIAGNOSIS — H5213 Myopia, bilateral: Secondary | ICD-10-CM | POA: Diagnosis not present

## 2022-01-13 DIAGNOSIS — H401122 Primary open-angle glaucoma, left eye, moderate stage: Secondary | ICD-10-CM | POA: Diagnosis not present

## 2022-01-13 DIAGNOSIS — H52223 Regular astigmatism, bilateral: Secondary | ICD-10-CM | POA: Diagnosis not present

## 2022-01-13 DIAGNOSIS — H524 Presbyopia: Secondary | ICD-10-CM | POA: Diagnosis not present

## 2022-01-17 ENCOUNTER — Encounter (HOSPITAL_COMMUNITY): Payer: Self-pay

## 2022-01-17 ENCOUNTER — Ambulatory Visit (HOSPITAL_COMMUNITY)
Admission: EM | Admit: 2022-01-17 | Discharge: 2022-01-17 | Disposition: A | Discharge status: Home / Self Care | Payer: Medicare Other | Attending: Family Medicine | Admitting: Family Medicine

## 2022-01-17 ENCOUNTER — Ambulatory Visit (INDEPENDENT_AMBULATORY_CARE_PROVIDER_SITE_OTHER): Payer: Medicare Other

## 2022-01-17 DIAGNOSIS — M79672 Pain in left foot: Secondary | ICD-10-CM

## 2022-01-17 DIAGNOSIS — M19072 Primary osteoarthritis, left ankle and foot: Secondary | ICD-10-CM | POA: Diagnosis not present

## 2022-01-17 DIAGNOSIS — S99922A Unspecified injury of left foot, initial encounter: Secondary | ICD-10-CM | POA: Diagnosis not present

## 2022-01-17 DIAGNOSIS — M7732 Calcaneal spur, left foot: Secondary | ICD-10-CM | POA: Diagnosis not present

## 2022-01-17 NOTE — ED Provider Notes (Signed)
?[facility] ? ? ?[id number] ?01/17/22 Arrival Time: 1202 ? ?ASSESSMENT & PLAN: ? ?1. Left foot pain   ? ?I have personally viewed the imaging studies ordered this visit. ?No foot/toe fracture appreciated. ? ?With partial nail avulsion. Bandage to hold in place and let new nail grow. Keep clean and dry. ?OTC analgesics as needed. ? ?Orders Placed This Encounter  ?Procedures  ? DG Foot Complete Left  ? Apply dressing  ? ? ?Recommend: ? Follow-up Information   ? ? Ladell Pier, MD.   ?Specialty: Internal Medicine ?Why: If worsening or failing to improve as anticipated. ?Contact information: ?[address] ?[address] ?[place] [address] ?[phone] ? ? ?  ?  ? ?  ?  ? ?  ? ? ?Reviewed expectations re: course of current medical issues. Questions answered. ?Outlined signs and symptoms indicating need for more acute intervention. ?Patient verbalized understanding. ?After Visit Summary given. ? ?SUBJECTIVE: ?History from: patient. ?Kayla Bean is a 66 y.o. female who reports L lat foot pain after striking door with bare foot last evening. Continuing pain today. Feels nail is a little loose. Ambulatory here. No extremity sensation changes or weakness.  ?No tx PTA. ? ? ?Past Surgical History:  ?Procedure Laterality Date  ? ABDOMINAL HYSTERECTOMY    ? CHOLECYSTECTOMY N/A 02/24/2019  ? Procedure: LAPAROSCOPIC CHOLECYSTECTOMY WITH INTRAOPERATIVE CHOLANGIOGRAM;  Surgeon: Donnie Mesa, MD;  Location: [facility];  Service: General;  Laterality: N/A;  ? KNEE SURGERY    ? left  ? THYROIDECTOMY    ? TOTAL KNEE ARTHROPLASTY    ? right knee  ?  ? ? ?OBJECTIVE: ? ?Vitals:  ? 01/17/22 1233  ?Pulse: 67  ?Resp: 17  ?Temp: 98.2 ?F (36.8 ?C)  ?TempSrc: Oral  ?SpO2: 97%  ?  ?General appearance: alert; no distress ?HEENT: [place]; AT ?Neck: supple with FROM ?Resp: unlabored respirations ?Extremities: ?LLE: warm with well perfused appearance; poorly localized mild to moderate tenderness over left lateral foot including 5th  toe where toenail is loose but attached; swelling: none; bruising: none; ankle ROM: normal ?CV: brisk extremity capillary refill of LUE; 2+ DP pulse of LLE. ?Skin: warm and dry; no visible rashes ?Neurologic: normal sensation and strength of LLE ?Psychological: alert and cooperative; normal mood and affect ? ?Imaging: ?DG Foot Complete Left ? ?Result Date: 01/17/2022 ?CLINICAL DATA:  Trauma.  Fifth toe injury EXAM: LEFT FOOT - COMPLETE 3+ VIEW COMPARISON:  12/26/2019 FINDINGS: Negative for fracture. Mild degenerative change in the midfoot.  Mild calcaneal spurring. IMPRESSION: Negative for fracture. Electronically Signed   By: Franchot Gallo M.D.   On: 01/17/2022 13:29   ? ? ? ?Allergies  ?Allergen Reactions  ? Wilder Glade [Dapagliflozin] Other (See Comments)  ?  Recurrent vaginal yeast  ? Latex Hives  ? Metformin And Related   ?  Hair loss  ? Penicillins Hives  ? Warfarin Sodium Hives  ?  REACTION: Got rash when took one pill of coumadin  related to knee replacement surgery in 2007.  ? Iodinated Contrast Media Other (See Comments)  ?  PT STATES SHE HAD AN ALLERGIC REACTION OF BLISTERS ON HANDS AND FEET 2 DAYS AFTER CT SCAN W/ IV CONTRAST INJECTION, DR. MANSELL SUGGESTS 13 HR PREP//A.C.  ? ? ?Past Medical History:  ?Diagnosis Date  ? Arthritis   ? Asthma   ? Diabetes mellitus   ? Heart murmur   ? Hemorrhoids   ? Hypertension   ? POAG (primary open-angle glaucoma)   ?  BL - followed by Dr. Schuyler Amor  ? Thyroid disease   ? hypothyroidism  ? ?Social History  ? ?Socioeconomic History  ? Marital status: Single  ?  Spouse name: Not on file  ? Number of children: Not on file  ? Years of education: Not on file  ? Highest education level: Not on file  ?Occupational History  ? Not on file  ?Tobacco Use  ? Smoking status: Former  ?  Types: Cigarettes  ?  Quit date: 09/18/2001  ?  Years since quitting: 20.3  ? Smokeless tobacco: Never  ?Substance and Sexual Activity  ? Alcohol use: Yes  ? Drug use: No  ? Sexual activity: Not Currently   ?Other Topics Concern  ? Not on file  ?Social History Narrative  ? Not on file  ? ?Social Determinants of Health  ? ?Financial Resource Strain: Not on file  ?Food Insecurity: Not on file  ?Transportation Needs: Not on file  ?Physical Activity: Not on file  ?Stress: Not on file  ?Social Connections: Not on file  ? ?Family History  ?Problem Relation Age of Onset  ? Asthma Other   ? Cancer Other   ? Hypertension Other   ? Diabetes Other   ? ?Past Surgical History:  ?Procedure Laterality Date  ? ABDOMINAL HYSTERECTOMY    ? CHOLECYSTECTOMY N/A 02/24/2019  ? Procedure: LAPAROSCOPIC CHOLECYSTECTOMY WITH INTRAOPERATIVE CHOLANGIOGRAM;  Surgeon: Donnie Mesa, MD;  Location: [facility];  Service: General;  Laterality: N/A;  ? KNEE SURGERY    ? left  ? THYROIDECTOMY    ? TOTAL KNEE ARTHROPLASTY    ? right knee  ? ? ? ?  ?Rosalyn Kick, MD ?01/17/22 1342 ? ?

## 2022-01-17 NOTE — ED Triage Notes (Signed)
Pt presents with left little toe injury after slamming it into a door frame last night. ?

## 2022-01-27 ENCOUNTER — Ambulatory Visit: Payer: Medicare Other | Attending: Internal Medicine | Admitting: Internal Medicine

## 2022-01-27 ENCOUNTER — Encounter: Payer: Self-pay | Admitting: Internal Medicine

## 2022-01-27 DIAGNOSIS — E1169 Type 2 diabetes mellitus with other specified complication: Secondary | ICD-10-CM | POA: Diagnosis not present

## 2022-01-27 DIAGNOSIS — I152 Hypertension secondary to endocrine disorders: Secondary | ICD-10-CM | POA: Diagnosis not present

## 2022-01-27 DIAGNOSIS — E1159 Type 2 diabetes mellitus with other circulatory complications: Secondary | ICD-10-CM

## 2022-01-27 DIAGNOSIS — E785 Hyperlipidemia, unspecified: Secondary | ICD-10-CM

## 2022-01-27 DIAGNOSIS — Z6841 Body Mass Index (BMI) 40.0 and over, adult: Secondary | ICD-10-CM

## 2022-01-27 DIAGNOSIS — E890 Postprocedural hypothyroidism: Secondary | ICD-10-CM | POA: Diagnosis not present

## 2022-01-27 DIAGNOSIS — M79675 Pain in left toe(s): Secondary | ICD-10-CM | POA: Diagnosis not present

## 2022-01-27 LAB — POCT GLYCOSYLATED HEMOGLOBIN (HGB A1C): HbA1c, POC (controlled diabetic range): 7.4 % — AB (ref 0.0–7.0)

## 2022-01-27 LAB — GLUCOSE, POCT (MANUAL RESULT ENTRY): POC Glucose: 190 mg/dl — AB (ref 70–99)

## 2022-01-27 MED ORDER — PRAVASTATIN SODIUM 40 MG PO TABS: 40.0000 mg | ORAL_TABLET | Freq: Every day | ORAL | 0 refills | Status: DC

## 2022-01-27 MED ORDER — SITAGLIPTIN PHOSPHATE 100 MG PO TABS: 100.0000 mg | ORAL_TABLET | Freq: Every day | ORAL | 1 refills | Status: DC

## 2022-01-27 MED ORDER — LEVOTHYROXINE SODIUM 100 MCG PO TABS: 100.0000 ug | ORAL_TABLET | Freq: Every day | ORAL | 1 refills | Status: DC

## 2022-01-27 MED ORDER — GLIPIZIDE 10 MG PO TABS: 10.0000 mg | ORAL_TABLET | Freq: Two times a day (BID) | ORAL | 1 refills | Status: DC

## 2022-01-27 NOTE — Progress Notes (Signed)
? ? ?Patient ID: Kayla Bean, female    DOB: 28-Aug-1956  MRN: [id number] ? ?CC: Diabetes ? ? ?Subjective: ?Kayla Bean is a 66 y.o. female who presents for chronic ds management ?Her concerns today include:  ?Patient with history of diabetes, hypertension, HL, post thyroidectomy hypothyroidism (1973, noncancerous), asthma, CKD stage III, OA knee and obesity, glaucoma. ? ?HTN: checks BP  regularly and reports readings have been good.  Blood pressure this morning when she checked it was 118/67.  She does not use salt in her foods.  No chest pains or shortness of breath.  No lower extremity edema. ? ?DM: ?Results for orders placed or performed in visit on 01/27/22  ?POCT glucose (manual entry)  ?Result Value Ref Range  ? POC Glucose 190 (A) 70 - 99 mg/dl  ?POCT glycosylated hemoglobin (Hb A1C)  ?Result Value Ref Range  ? Hemoglobin A1C    ? HbA1c POC (<> result, manual entry)    ? HbA1c, POC (prediabetic range)    ? HbA1c, POC (controlled diabetic range) 7.4 (A) 0.0 - 7.0 %  ?Reports compliance with taking glipizide 10 mg twice a day and Januvia 100 mg daily. ?Checks BS 2x/wk. Range in low 100s. BS elev today, just drank an Ensure shake ?Eating habits good, staying away from fried chicken.  Eating more salads, baked meats, chicken and steak.  ?Down 8 lbs since 04/2021.She has been mowing her grass about once every 2 to 3 weeks..  Not safe to walk in neighborhood.  Thinks she may have silver sneakers.  She plans to check with her insurance. ? ?Hypothyroid: Reports compliance with taking her levothyroxine 100 mcg daily first thing in the morning was before she takes her other medications. ? ?Complains of pain in her left fifth toe.  About 1 week ago she stubbed it against a door in her house.  Seen at urgent care and x-ray was negative for fracture.  The toe is still sore and the nail is loose. ? ?HM: She had her second shingles vaccine and COVID booster at [facility] on [address].  I will have our  clinical pharmacist call to get this information so that we can update her health maintenance. ?Patient Active Problem List  ? Diagnosis Date Noted  ? Controlled type 2 diabetes mellitus without complication, without long-term current use of insulin ([place]) 07/08/2020  ? Class 3 severe obesity with serious comorbidity and body mass index (BMI) of 45.0 to 49.9 in adult [facility]) 10/01/2018  ? Moderate persistent asthma without complication 74/73/4037  ? Hyperlipidemia 03/22/2017  ? Cholelithiasis 12/24/2012  ? LIPOMA 07/26/2010  ? CONSTIPATION, CHRONIC 01/18/2009  ? EUSTACHIAN TUBE DYSFUNCTION, RIGHT 10/16/2008  ? LOM 09/01/2008  ? Essential hypertension 08/18/2008  ? Osteoarthritis of left knee 04/23/2008  ? Hypothyroidism 10/15/2007  ?  ? ?Current Outpatient Medications on File Prior to Visit  ?Medication Sig Dispense Refill  ? albuterol (VENTOLIN HFA) 108 (90 Base) MCG/ACT inhaler INHALE 2 PUFFS BY MOUTH EVERY 6 HOURS AS NEEDED FOR WHEEZING FOR SHORTNESS OF BREATH 18 g 0  ? amLODipine (NORVASC) 10 MG tablet Take 1 tablet (10 mg total) by mouth daily. 90 tablet 1  ? aspirin 81 MG tablet Take 1 tablet (81 mg total) daily by mouth. 90 tablet 0  ? Blood Glucose Monitoring Suppl (ACCU-CHEK GUIDE ME) w/Device KIT 1 Device by Does not apply route daily. 1 kit 0  ? brimonidine-timolol (COMBIGAN) 0.2-0.5 % ophthalmic solution Place 1 drop into both eyes every 12 (  twelve) hours.    ? cycloSPORINE (RESTASIS) 0.05 % ophthalmic emulsion Place 1 drop into both eyes 2 (two) times daily.    ? glucose blood (ACCU-CHEK GUIDE) test strip Use as instructed 100 each 12  ? Lancet Devices (ACCU-CHEK SOFTCLIX) lancets Use as instructed for 3 times daily testing of blood glucose 1 each 0  ? Lancets Misc. (ACCU-CHEK FASTCLIX LANCET) KIT Use as directed 1 kit 12  ? latanoprost (XALATAN) 0.005 % ophthalmic solution Place 1 drop into both eyes at bedtime.     ? lisinopril (ZESTRIL) 20 MG tablet Take 1 tablet (20 mg total) by mouth daily. 90 tablet  1  ? promethazine-dextromethorphan (PROMETHAZINE-DM) 6.25-15 MG/5ML syrup Take 5 mLs by mouth at bedtime as needed for cough. (Patient not taking: Reported on 01/27/2022) 100 mL 0  ? ?No current facility-administered medications on file prior to visit.  ? ? ?Allergies  ?Allergen Reactions  ? Wilder Glade [Dapagliflozin] Other (See Comments)  ?  Recurrent vaginal yeast  ? Latex Hives  ? Metformin And Related   ?  Hair loss  ? Penicillins Hives  ? Warfarin Sodium Hives  ?  REACTION: Got rash when took one pill of coumadin  related to knee replacement surgery in 2007.  ? Iodinated Contrast Media Other (See Comments)  ?  PT STATES SHE HAD AN ALLERGIC REACTION OF BLISTERS ON HANDS AND FEET 2 DAYS AFTER CT SCAN W/ IV CONTRAST INJECTION, DR. MANSELL SUGGESTS 13 HR PREP//A.C.  ? ? ?Social History  ? ?Socioeconomic History  ? Marital status: Single  ?  Spouse name: Not on file  ? Number of children: Not on file  ? Years of education: Not on file  ? Highest education level: Not on file  ?Occupational History  ? Not on file  ?Tobacco Use  ? Smoking status: Former  ?  Types: Cigarettes  ?  Quit date: 09/18/2001  ?  Years since quitting: 20.3  ? Smokeless tobacco: Never  ?Substance and Sexual Activity  ? Alcohol use: Yes  ? Drug use: No  ? Sexual activity: Not Currently  ?Other Topics Concern  ? Not on file  ?Social History Narrative  ? Not on file  ? ?Social Determinants of Health  ? ?Financial Resource Strain: Not on file  ?Food Insecurity: Not on file  ?Transportation Needs: Not on file  ?Physical Activity: Not on file  ?Stress: Not on file  ?Social Connections: Not on file  ?Intimate Partner Violence: Not on file  ? ? ?Family History  ?Problem Relation Age of Onset  ? Asthma Other   ? Cancer Other   ? Hypertension Other   ? Diabetes Other   ? ? ?Past Surgical History:  ?Procedure Laterality Date  ? ABDOMINAL HYSTERECTOMY    ? CHOLECYSTECTOMY N/A 02/24/2019  ? Procedure: LAPAROSCOPIC CHOLECYSTECTOMY WITH INTRAOPERATIVE CHOLANGIOGRAM;   Surgeon: Donnie Mesa, MD;  Location: [place];  Service: General;  Laterality: N/A;  ? KNEE SURGERY    ? left  ? THYROIDECTOMY    ? TOTAL KNEE ARTHROPLASTY    ? right knee  ? ? ?ROS: ?Review of Systems ?Negative except as stated above ? ?PHYSICAL EXAM: ?BP 117/76   Pulse 72   Ht $R'5\' 2"'KT$  (1.575 m)   Wt 243 lb (110.2 kg)   SpO2 98%   BMI 44.45 kg/m?   ?Wt Readings from Last 3 Encounters:  ?01/27/22 243 lb (110.2 kg)  ?05/12/21 251 lb 12.8 oz (114.2 kg)  ?02/22/21 249 lb 6.4 oz (  113.1 kg)  ? ? ?Physical Exam ? ?General appearance - alert, well appearing, obese older African-American female and in no distress ?Mental status - normal mood, behavior, speech, dress, motor activity, and thought processes ?Mouth - mucous membranes moist, pharynx normal without lesions ?Neck - supple, no significant adenopathy ?Chest - clear to auscultation, no wheezes, rales or rhonchi, symmetric air entry ?Heart - normal rate, regular rhythm, normal S1, S2, no murmurs, rubs, clicks or gallops ?Musculoskeletal -left foot: No edema or erythema noted of the left fifth toe.  The toenail however is partially loose when touched. ?Extremities - peripheral pulses normal, no pedal edema, no clubbing or cyanosis ?Diabetic Foot Exam - Simple   ?Simple Foot Form ?Diabetic Foot exam was performed with the following findings: Yes 01/27/2022  6:24 PM  ?Visual Inspection ?See comments: Yes ?Sensation Testing ?Intact to touch and monofilament testing bilaterally: Yes ?Pulse Check ?Posterior Tibialis and Dorsalis pulse intact bilaterally: Yes ?Comments ?Patient is flat-footed.  Toenail on the left fifth toe is loose. ?  ? ? ? ? ?  Latest Ref Rng & Units 05/12/2021  ?  4:45 PM 07/08/2020  ?  4:09 PM 10/22/2019  ? 11:13 AM  ?CMP  ?Glucose 65 - 99 mg/dL 122   111   131    ?BUN 8 - 27 mg/dL $Remove'13   11   13    'gUFEWYJ$ ?Creatinine 0.57 - 1.00 mg/dL 1.06   1.01   1.09    ?Sodium 134 - 144 mmol/L 136   140   142    ?Potassium 3.5 - 5.2 mmol/L 4.3   4.3   3.9    ?Chloride 96 -  106 mmol/L 100   102   109    ?CO2 20 - 29 mmol/L $RemoveB'22   25   24    'uVcqTBpK$ ?Calcium 8.7 - 10.3 mg/dL 9.7   9.6   9.1    ?Total Protein 6.0 - 8.5 g/dL 8.3   8.6   7.7    ?Total Bilirubin 0.0 - 1.2 mg/dL 0.4   0.5   0.6    ?

## 2022-01-27 NOTE — Progress Notes (Signed)
Left pinky toe injury. ?

## 2022-01-30 ENCOUNTER — Encounter: Payer: Self-pay | Admitting: Pharmacist

## 2022-01-30 ENCOUNTER — Telehealth: Payer: Self-pay | Admitting: Internal Medicine

## 2022-01-30 NOTE — Telephone Encounter (Signed)
-----   Message from Tresa Endo, RPH-CPP sent at 01/30/2022  9:05 AM EDT ----- ?HM updated with Shingrix. In regards to her 4th Covid vaccine (2nd booster), they do not have record of this. She will need to bring in her card. I checked the [facility] website as well and they only have 3 Covid vaccines listed with the last being administered 07/22/20 (accurate with what he have in our system).  ?----- Message ----- ?From: Ladell Pier, MD ?Sent: 01/27/2022   6:29 PM EDT ?To: Tresa Endo, RPH-CPP ? ?Patient reports having had her second Shingrix and COVID booster shots a few months ago at [facility] [employer].  Please have your student call and get the information so that we can update her health maintenance.  Thanks. ? ? ?

## 2022-01-31 ENCOUNTER — Ambulatory Visit: Payer: Medicare Other | Attending: Internal Medicine

## 2022-01-31 DIAGNOSIS — E1169 Type 2 diabetes mellitus with other specified complication: Secondary | ICD-10-CM | POA: Diagnosis not present

## 2022-01-31 DIAGNOSIS — E890 Postprocedural hypothyroidism: Secondary | ICD-10-CM | POA: Diagnosis not present

## 2022-01-31 DIAGNOSIS — M79675 Pain in left toe(s): Secondary | ICD-10-CM | POA: Diagnosis not present

## 2022-02-01 LAB — TSH: TSH: 0.697 u[IU]/mL (ref 0.450–4.500)

## 2022-02-01 LAB — COMPREHENSIVE METABOLIC PANEL
ALT: 13 IU/L (ref 0–32)
AST: 14 IU/L (ref 0–40)
Albumin/Globulin Ratio: 1.1 — ABNORMAL LOW (ref 1.2–2.2)
Albumin: 3.9 g/dL (ref 3.8–4.8)
Alkaline Phosphatase: 121 IU/L (ref 44–121)
BUN/Creatinine Ratio: 9 — ABNORMAL LOW (ref 12–28)
BUN: 11 mg/dL (ref 8–27)
Bilirubin Total: 0.3 mg/dL (ref 0.0–1.2)
CO2: 23 mmol/L (ref 20–29)
Calcium: 9.4 mg/dL (ref 8.7–10.3)
Chloride: 104 mmol/L (ref 96–106)
Creatinine, Ser: 1.2 mg/dL — ABNORMAL HIGH (ref 0.57–1.00)
Globulin, Total: 3.7 g/dL (ref 1.5–4.5)
Glucose: 145 mg/dL — ABNORMAL HIGH (ref 70–99)
Potassium: 4.6 mmol/L (ref 3.5–5.2)
Sodium: 141 mmol/L (ref 134–144)
Total Protein: 7.6 g/dL (ref 6.0–8.5)
eGFR: 50 mL/min/{1.73_m2} — ABNORMAL LOW (ref >59)

## 2022-02-01 LAB — LIPID PANEL
Chol/HDL Ratio: 2.8 ratio (ref 0.0–4.4)
Cholesterol, Total: 124 mg/dL (ref 100–199)
HDL: 44 mg/dL (ref >39)
LDL Chol Calc (NIH): 68 mg/dL (ref 0–99)
Triglycerides: 54 mg/dL (ref 0–149)
VLDL Cholesterol Cal: 12 mg/dL (ref 5–40)

## 2022-02-01 LAB — MICROALBUMIN / CREATININE URINE RATIO
Creatinine, Urine: 130 mg/dL
Microalb/Creat Ratio: 3 mg/g creat (ref 0–29)
Microalbumin, Urine: 4 ug/mL

## 2022-02-01 LAB — CBC
Hematocrit: 38.4 % (ref 34.0–46.6)
Hemoglobin: 13.2 g/dL (ref 11.1–15.9)
MCH: 31.1 pg (ref 26.6–33.0)
MCHC: 34.4 g/dL (ref 31.5–35.7)
MCV: 90 fL (ref 79–97)
Platelets: 273 10*3/uL (ref 150–450)
RBC: 4.25 x10E6/uL (ref 3.77–5.28)
RDW: 12.8 % (ref 11.7–15.4)
WBC: 9.5 10*3/uL (ref 3.4–10.8)

## 2022-02-01 NOTE — Progress Notes (Signed)
Kidney function not 100% and remains relatively in the same range.  Avoid taking over-the-counter anti-inflammatory pills like ibuprofen, Aleve, Motrin, Advil and Naprosyn as these can make kidney function worse.  Good diabetes and blood pressure control also important.  We will continue to monitor.  Liver function tests normal.  Cholesterol levels within normal range.  Thyroid level normal.  Blood cell counts normal.

## 2022-02-17 ENCOUNTER — Telehealth: Payer: Self-pay

## 2022-02-17 NOTE — Telephone Encounter (Signed)
Pt was given lab results from Dr. Wynetta Emery on 02/17/22. Pt verbalized understanding and scheduled for 4 month fu appt on 06/01/22.

## 2022-02-20 ENCOUNTER — Encounter: Payer: Medicare Other | Admitting: Pharmacist

## 2022-02-23 ENCOUNTER — Encounter: Payer: Self-pay | Admitting: Pharmacist

## 2022-02-23 ENCOUNTER — Ambulatory Visit: Payer: Medicare Other | Attending: Internal Medicine | Admitting: Pharmacist

## 2022-02-23 VITALS — Ht 62.0 in | Wt 246.8 lb

## 2022-02-23 DIAGNOSIS — Z0000 Encounter for general adult medical examination without abnormal findings: Secondary | ICD-10-CM

## 2022-02-23 NOTE — Progress Notes (Signed)
Subjective:   Kayla Bean is a 66 y.o. female who presents for Medicare Annual (Subsequent) preventive examination.  Objective:    Today's Vitals   02/23/22 1556  Weight: 246 lb 12.8 oz (111.9 kg)  Height: $Remove'5\' 2"'eeiEVIe$  (1.575 m)  PainSc: 0-No pain   Body mass index is 45.14 kg/m.     02/23/2022    4:05 PM 02/22/2021    2:33 PM 10/22/2019   10:25 AM 02/25/2019    3:41 AM 07/03/2018    3:26 PM 09/09/2017    9:46 PM 05/10/2017    9:01 AM  Advanced Directives  Does Patient Have a Medical Advance Directive? No No No No Yes No No  Does patient want to make changes to medical advance directive?     No - Patient declined    Would patient like information on creating a medical advance directive? No - Patient declined No - Patient declined No - Patient declined No - Guardian declined   No - Patient declined    Current Medications (verified) Outpatient Encounter Medications as of 02/23/2022  Medication Sig   albuterol (VENTOLIN HFA) 108 (90 Base) MCG/ACT inhaler INHALE 2 PUFFS BY MOUTH EVERY 6 HOURS AS NEEDED FOR WHEEZING FOR SHORTNESS OF BREATH   amLODipine (NORVASC) 10 MG tablet Take 1 tablet (10 mg total) by mouth daily.   aspirin 81 MG tablet Take 1 tablet (81 mg total) daily by mouth.   Blood Glucose Monitoring Suppl (ACCU-CHEK GUIDE ME) w/Device KIT 1 Device by Does not apply route daily.   brimonidine-timolol (COMBIGAN) 0.2-0.5 % ophthalmic solution Place 1 drop into both eyes every 12 (twelve) hours.   cycloSPORINE (RESTASIS) 0.05 % ophthalmic emulsion Place 1 drop into both eyes 2 (two) times daily.   glipiZIDE (GLUCOTROL) 10 MG tablet Take 1 tablet (10 mg total) by mouth 2 (two) times daily before a meal.   glucose blood (ACCU-CHEK GUIDE) test strip Use as instructed   Lancet Devices (ACCU-CHEK SOFTCLIX) lancets Use as instructed for 3 times daily testing of blood glucose   Lancets Misc. (ACCU-CHEK FASTCLIX LANCET) KIT Use as directed   latanoprost (XALATAN) 0.005 % ophthalmic  solution Place 1 drop into both eyes at bedtime.    levothyroxine (SYNTHROID) 100 MCG tablet Take 1 tablet (100 mcg total) by mouth daily before breakfast.   lisinopril (ZESTRIL) 20 MG tablet Take 1 tablet (20 mg total) by mouth daily.   pravastatin (PRAVACHOL) 40 MG tablet Take 1 tablet (40 mg total) by mouth daily.   sitaGLIPtin (JANUVIA) 100 MG tablet Take 1 tablet (100 mg total) by mouth daily.   [DISCONTINUED] promethazine-dextromethorphan (PROMETHAZINE-DM) 6.25-15 MG/5ML syrup Take 5 mLs by mouth at bedtime as needed for cough. (Patient not taking: Reported on 01/27/2022)   No facility-administered encounter medications on file as of 02/23/2022.    Allergies (verified) Farxiga [dapagliflozin], Latex, Metformin, Metformin and related, Penicillin g, Penicillins, Warfarin sodium, Warfarin sodium, and Iodinated contrast media   History: Past Medical History:  Diagnosis Date   Arthritis    Asthma    Diabetes mellitus    Heart murmur    Hemorrhoids    Hypertension    POAG (primary open-angle glaucoma)    BL - followed by Dr. Schuyler Amor   Thyroid disease    hypothyroidism   Past Surgical History:  Procedure Laterality Date   ABDOMINAL HYSTERECTOMY     CHOLECYSTECTOMY N/A 02/24/2019   Procedure: LAPAROSCOPIC CHOLECYSTECTOMY WITH INTRAOPERATIVE CHOLANGIOGRAM;  Surgeon: Donnie Mesa, MD;  Location: [facility]  OR;  Service: General;  Laterality: N/A;   KNEE SURGERY     left   THYROIDECTOMY     TOTAL KNEE ARTHROPLASTY     right knee   Family History  Problem Relation Age of Onset   Asthma Other    Cancer Other    Hypertension Other    Diabetes Other    Social History   Socioeconomic History   Marital status: Single    Spouse name: Not on file   Number of children: Not on file   Years of education: Not on file   Highest education level: Not on file  Occupational History   Not on file  Tobacco Use   Smoking status: Former    Types: Cigarettes    Quit date: 09/18/2001    Years since  quitting: 20.4   Smokeless tobacco: Never  Substance and Sexual Activity   Alcohol use: Yes   Drug use: No   Sexual activity: Not Currently  Other Topics Concern   Not on file  Social History Narrative   Not on file   Social Determinants of Health   Financial Resource Strain: Not on file  Food Insecurity: Not on file  Transportation Needs: Not on file  Physical Activity: Not on file  Stress: Not on file  Social Connections: Not on file    Tobacco Counseling Counseling given: Not Answered   Clinical Intake:  Pre-visit preparation completed: No  Pain : No/denies pain Pain Score: 0-No pain     BMI - recorded: 45.14 Nutritional Status: BMI > 30  Obese Diabetes: Yes CBG done?: No CBG resulted in Enter/ Edit results?: No Did pt. bring in CBG monitor from home?: No  How often do you need to have someone help you when you read instructions, pamphlets, or other written materials from your doctor or pharmacy?: 1 - Never What is the last grade level you completed in school?: 12th; post-graduate training  Diabetic? Yes  Interpreter Needed?: No  Information entered by :: [place]   Activities of Daily Living    02/23/2022    4:08 PM  In your present state of health, do you have any difficulty performing the following activities:  Hearing? 0  Vision? 0  Difficulty concentrating or making decisions? 0  Walking or climbing stairs? 0  Dressing or bathing? 0  Doing errands, shopping? 0  Preparing Food and eating ? N  Using the Toilet? N  In the past six months, have you accidently leaked urine? N  Do you have problems with loss of bowel control? N  Managing your Medications? N  Managing your Finances? N  Housekeeping or managing your Housekeeping? N    Patient Care Team: Ladell Pier, MD as PCP - General (Internal Medicine)  Indicate any recent Medical Services you may have received from other than [facility] providers in the past year (date may be approximate).      Assessment:   This is a routine wellness examination for Kayla Bean.  Hearing/Vision screen No results found.  Dietary issues and exercise activities discussed: Current Exercise Habits: The patient does not participate in regular exercise at present, Exercise limited by: orthopedic condition(s)   Goals Addressed   None   Depression Screen    02/23/2022    4:07 PM 05/12/2021    4:23 PM 02/22/2021    2:32 PM 08/05/2020   10:25 AM 07/08/2020    3:11 PM 06/12/2019   12:08 PM 10/01/2018    2:54 PM  PHQ  2/9 Scores  PHQ - 2 Score 0 0 0 0 0 0   PHQ- 9 Score     0    Exception Documentation       Patient refusal    Fall Risk    02/23/2022    4:06 PM 01/27/2022    4:10 PM 05/12/2021    4:23 PM 02/22/2021    2:32 PM 12/17/2020   10:36 AM  Fall Risk   Falls in the past year? 0 0 0 0 0  Number falls in past yr: 0 0 0 0 0  Injury with Fall? 0 0 0 0 0  Risk for fall due to :   No Fall Risks No Fall Risks   Follow up Falls evaluation completed;Education provided;Falls prevention discussed        FALL RISK PREVENTION PERTAINING TO THE HOME:  Any stairs in or around the home? No  If so, are there any without handrails? No  Home free of loose throw rugs in walkways, pet beds, electrical cords, etc? No  Adequate lighting in your home to reduce risk of falls? No   ASSISTIVE DEVICES UTILIZED TO PREVENT FALLS:  Life alert? No  Use of a cane, walker or w/c? No  Grab bars in the bathroom? No  Shower chair or bench in shower? No  Elevated toilet seat or a handicapped toilet? No   TIMED UP AND GO:  Was the test performed? Yes .  Length of time to ambulate 10 feet: 8 sec.   Gait slow and steady without use of assistive device  Cognitive Function:    02/23/2022    4:09 PM 02/22/2021    2:33 PM  MMSE - Mini Mental State Exam  Orientation to time 5 5  Orientation to Place 5 5  Registration 3 3  Attention/ Calculation 5 5  Recall 2 2  Language- name 2 objects 2 2  Language- repeat 1 1   Language- follow 3 step command 3 3  Language- read & follow direction 1 1  Write a sentence 1 1  Copy design 1 0  Total score 29 28        Immunizations Immunization History  Administered Date(s) Administered   H1N1 08/18/2008   Influenza Whole 07/31/2007, 08/18/2008, 08/10/2009, 07/26/2010   Influenza, Quadrivalent, Recombinant, Inj, Pf 05/23/2019   Influenza,inj,Quad PF,6+ Mos 06/17/2015, 07/19/2016, 05/14/2018, 05/19/2020, 05/12/2021   PFIZER(Purple Top)SARS-COV-2 Vaccination 11/29/2019, 01/07/2020, 07/22/2020   Pneumococcal Conjugate-13 05/10/2017   Pneumococcal Polysaccharide-23 01/18/2009, 11/12/2019   Td 01/18/2009   Tdap 04/24/2019, 08/22/2020   Zoster Recombinat (Shingrix) 02/22/2021, 11/29/2021    TDAP status: Up to date  Flu Vaccine status: Up to date  Pneumococcal vaccine status: Up to date  Covid-19 vaccine status: Completed vaccines  Qualifies for Shingles Vaccine? Yes   Zostavax completed No   Shingrix Completed?: Yes  Screening Tests Health Maintenance  Topic Date Due   COVID-19 Vaccine (4 - Booster for Pfizer series) 09/16/2020   INFLUENZA VACCINE  04/18/2022   OPHTHALMOLOGY EXAM  04/28/2022   HEMOGLOBIN A1C  07/30/2022   FOOT EXAM  01/28/2023   MAMMOGRAM  07/26/2023   COLONOSCOPY (Pts 45-51yrs Insurance coverage will need to be confirmed)  03/07/2027   TETANUS/TDAP  08/22/2030   Pneumonia Vaccine 46+ Years old  Completed   DEXA SCAN  Completed   Hepatitis C Screening  Completed   Zoster Vaccines- Shingrix  Completed   [facility] [place]  Maintenance Due  Topic Date Due   COVID-19 Vaccine (4 - Booster for Pfizer series) 09/16/2020    Colorectal cancer screening: Type of screening: Colonoscopy. Completed 03/06/2018. Repeat every 10 years  Mammogram status: Completed 08/04/21. Repeat every year  Bone Density status: Completed 08/25/2021. Results reflect: Bone density results: NORMAL. Repeat every 3-5  years.  Lung Cancer Screening: (Low Dose CT Chest recommended if Age 47-80 years, 30 pack-year currently smoking OR have quit w/in 15years.) does not qualify.   Lung Cancer Screening Referral: None needed  Additional Screening:  Hepatitis C Screening: does qualify; Completed 03/14/16  Vision Screening: Recommended annual ophthalmology exams for early detection of glaucoma and other disorders of the eye. Is the patient up to date with their annual eye exam?  Yes  Who is the provider or what is the name of the office in which the patient attends annual eye exams? [facility]  Dental Screening: Recommended annual dental exams for proper oral hygiene  Community Resource Referral / Chronic Care Management: CRR required this visit?  No   CCM required this visit?  No      Plan:     I have personally reviewed and noted the following in the patient's chart:   Medical and social history Use of alcohol, tobacco or illicit drugs  Current medications and supplements including opioid prescriptions.  Functional ability and status Nutritional status Physical activity Advanced directives List of other physicians Hospitalizations, surgeries, and ER visits in previous 12 months Vitals Screenings to include cognitive, depression, and falls Referrals and appointments  In addition, I have reviewed and discussed with patient certain preventive protocols, quality metrics, and best practice recommendations. A written personalized care plan for preventive services as well as general preventive health recommendations were provided to patient.     Tresa Endo, RPH-CPP   02/23/2022

## 2022-03-03 ENCOUNTER — Other Ambulatory Visit: Payer: Self-pay | Admitting: Internal Medicine

## 2022-03-03 DIAGNOSIS — I10 Essential (primary) hypertension: Secondary | ICD-10-CM

## 2022-03-09 DIAGNOSIS — H16223 Keratoconjunctivitis sicca, not specified as Sjogren's, bilateral: Secondary | ICD-10-CM | POA: Diagnosis not present

## 2022-03-09 DIAGNOSIS — H401111 Primary open-angle glaucoma, right eye, mild stage: Secondary | ICD-10-CM | POA: Diagnosis not present

## 2022-03-09 DIAGNOSIS — H401122 Primary open-angle glaucoma, left eye, moderate stage: Secondary | ICD-10-CM | POA: Diagnosis not present

## 2022-03-13 DIAGNOSIS — M1712 Unilateral primary osteoarthritis, left knee: Secondary | ICD-10-CM | POA: Diagnosis not present

## 2022-03-23 ENCOUNTER — Other Ambulatory Visit: Payer: Self-pay | Admitting: Internal Medicine

## 2022-03-24 NOTE — Telephone Encounter (Signed)
rx was sent to pharmacy on 01/27/22. Requested Prescriptions  Pending Prescriptions Disp Refills  . glipiZIDE (GLUCOTROL) 10 MG tablet [Pharmacy Med Name: glipiZIDE 10 MG Oral Tablet] 180 tablet 0    Sig: TAKE 1 TABLET BY MOUTH TWICE DAILY BEFORE A MEAL     Endocrinology:  Diabetes - Sulfonylureas Failed - 03/23/2022  7:16 PM      Failed - Cr in normal range and within 360 days    Creat  Date Value Ref Range Status  07/19/2016 1.29 (H) 0.50 - 0.99 mg/dL Final    Comment:      For patients > or = 68 years of age: The upper reference limit for Creatinine is approximately 13% higher for people identified as African-American.      Creatinine, Ser  Date Value Ref Range Status  01/31/2022 1.20 (H) 0.57 - 1.00 mg/dL Final   Creatinine, Urine  Date Value Ref Range Status  03/19/2013 129.5 mg/dL Final         Passed - HBA1C is between 0 and 7.9 and within 180 days    HbA1c, POC (controlled diabetic range)  Date Value Ref Range Status  01/27/2022 7.4 (A) 0.0 - 7.0 % Final         Passed - Valid encounter within last 6 months    Recent Outpatient Visits          4 weeks ago Encounter for [employer] annual wellness exam   [place], Annie Main L, RPH-CPP   1 month ago Type 2 diabetes mellitus with morbid obesity ([place])   [place], MD   5 months ago Viral laryngitis   [place], MD   10 months ago Type 2 diabetes mellitus with morbid obesity [facility])   [place] Ladell Pier, MD   1 year ago Encounter for [employer] annual wellness exam   [place], MD      Future Appointments            In 2 months Wynetta Emery Dalbert Batman, MD [place]           . JANUVIA 100 MG tablet [Pharmacy Med Name: Januvia 100 MG Oral Tablet] 90 tablet  0    Sig: Take 1 tablet by mouth once daily     Endocrinology:  Diabetes - DPP-4 Inhibitors Failed - 03/23/2022  7:16 PM      Failed - Cr in normal range and within 360 days    Creat  Date Value Ref Range Status  07/19/2016 1.29 (H) 0.50 - 0.99 mg/dL Final    Comment:      For patients > or = 68 years of age: The upper reference limit for Creatinine is approximately 13% higher for people identified as African-American.      Creatinine, Ser  Date Value Ref Range Status  01/31/2022 1.20 (H) 0.57 - 1.00 mg/dL Final   Creatinine, Urine  Date Value Ref Range Status  03/19/2013 129.5 mg/dL Final         Passed - HBA1C is between 0 and 7.9 and within 180 days    HbA1c, POC (controlled diabetic range)  Date Value Ref Range Status  01/27/2022 7.4 (A) 0.0 - 7.0 % Final         Passed - Valid encounter within last 6  months    Recent Outpatient Visits          4 weeks ago Encounter for [employer] annual wellness exam   [place], Jarome Matin, RPH-CPP   1 month ago Type 2 diabetes mellitus with morbid obesity [facility])   [place], MD   5 months ago Viral laryngitis   [place], MD   10 months ago Type 2 diabetes mellitus with morbid obesity [facility])   [place] Ladell Pier, MD   1 year ago Encounter for [employer] annual wellness exam   [place], MD      Future Appointments            In 2 months Wynetta Emery Dalbert Batman, MD [place]

## 2022-03-28 ENCOUNTER — Encounter (HOSPITAL_COMMUNITY): Payer: Self-pay | Admitting: *Deleted

## 2022-03-28 ENCOUNTER — Ambulatory Visit (HOSPITAL_COMMUNITY)
Admission: EM | Admit: 2022-03-28 | Discharge: 2022-03-28 | Disposition: A | Discharge status: Home / Self Care | Payer: Medicare Other | Attending: Emergency Medicine | Admitting: Emergency Medicine

## 2022-03-28 ENCOUNTER — Other Ambulatory Visit: Payer: Self-pay

## 2022-03-28 DIAGNOSIS — H60501 Unspecified acute noninfective otitis externa, right ear: Secondary | ICD-10-CM | POA: Diagnosis not present

## 2022-03-28 DIAGNOSIS — H6121 Impacted cerumen, right ear: Secondary | ICD-10-CM

## 2022-03-28 MED ORDER — CEFDINIR 300 MG PO CAPS: 300.0000 mg | ORAL_CAPSULE | Freq: Two times a day (BID) | ORAL | 0 refills | Status: DC

## 2022-03-28 MED ORDER — OFLOXACIN 0.3 % OT SOLN: 10.0000 [drp] | Freq: Every day | OTIC | 0 refills | Status: DC

## 2022-03-28 NOTE — ED Triage Notes (Signed)
Pt reports Rt ear pain since yesterday.

## 2022-03-28 NOTE — ED Provider Notes (Signed)
[facility]    CSN: [id number] Arrival date & time: 03/28/22  1124      History   Chief Complaint Chief Complaint  Patient presents with   Otalgia    HPI Vonna Brabson is a 66 y.o. female.  Patient reports right ear pain since yesterday.  Denies fever, chills, sore throat.  Reports always has mild rhinorrhea, does not feel ill.  Patient feels like external ear and in front of ear is swollen    Otalgia   Past Medical History:  Diagnosis Date   Arthritis    Asthma    Diabetes mellitus    Heart murmur    Hemorrhoids    Hypertension    POAG (primary open-angle glaucoma)    BL - followed by Dr. Schuyler Amor   Thyroid disease    hypothyroidism    Patient Active Problem List   Diagnosis Date Noted   Controlled type 2 diabetes mellitus without complication, without long-term current use of insulin ([place]) 07/08/2020   Class 3 severe obesity with serious comorbidity and body mass index (BMI) of 45.0 to 49.9 in adult ([place]) 10/01/2018   Moderate persistent asthma without complication 57/84/6962   Hyperlipidemia 03/22/2017   Cholelithiasis 12/24/2012   LIPOMA 07/26/2010   CONSTIPATION, CHRONIC 01/18/2009   EUSTACHIAN TUBE DYSFUNCTION, RIGHT 10/16/2008   LOM 09/01/2008   Essential hypertension 08/18/2008   Osteoarthritis of left knee 04/23/2008   Hypothyroidism 10/15/2007    Past Surgical History:  Procedure Laterality Date   ABDOMINAL HYSTERECTOMY     CHOLECYSTECTOMY N/A 02/24/2019   Procedure: LAPAROSCOPIC CHOLECYSTECTOMY WITH INTRAOPERATIVE CHOLANGIOGRAM;  Surgeon: Donnie Mesa, MD;  Location: [facility];  Service: General;  Laterality: N/A;   KNEE SURGERY     left   THYROIDECTOMY     TOTAL KNEE ARTHROPLASTY     right knee    OB History   No obstetric history on file.      Home Medications    Prior to Admission medications   Medication Sig Start Date End Date Taking? Authorizing Provider  cefdinir (OMNICEF) 300 MG capsule Take 1 capsule (300 mg  total) by mouth 2 (two) times daily. 03/28/22  Yes Carvel Getting, NP  ofloxacin (FLOXIN) 0.3 % OTIC solution Place 10 drops into the right ear daily. 03/28/22  Yes Carvel Getting, NP  albuterol (VENTOLIN HFA) 108 (90 Base) MCG/ACT inhaler INHALE 2 PUFFS BY MOUTH EVERY 6 HOURS AS NEEDED FOR WHEEZING FOR SHORTNESS OF BREATH 01/03/22   Ladell Pier, MD  amLODipine (NORVASC) 10 MG tablet Take 1 tablet (10 mg total) by mouth daily. 09/27/21   Ladell Pier, MD  aspirin 81 MG tablet Take 1 tablet (81 mg total) daily by mouth. 07/30/17   Ladell Pier, MD  Blood Glucose Monitoring Suppl (ACCU-CHEK GUIDE ME) w/Device KIT 1 Device by Does not apply route daily. 05/12/21   Ladell Pier, MD  brimonidine-timolol (COMBIGAN) 0.2-0.5 % ophthalmic solution Place 1 drop into both eyes every 12 (twelve) hours.    [provider]  cycloSPORINE (RESTASIS) 0.05 % ophthalmic emulsion Place 1 drop into both eyes 2 (two) times daily.    [provider]  glipiZIDE (GLUCOTROL) 10 MG tablet Take 1 tablet (10 mg total) by mouth 2 (two) times daily before a meal. 01/27/22   Ladell Pier, MD  glucose blood (ACCU-CHEK GUIDE) test strip Use as instructed 04/25/19   Ladell Pier, MD  Lancet Devices [facility]) lancets Use as  instructed for 3 times daily testing of blood glucose 08/09/16   Langeland, Dawn T, MD  Lancets Misc. (ACCU-CHEK FASTCLIX LANCET) KIT Use as directed 04/25/19   Ladell Pier, MD  latanoprost (XALATAN) 0.005 % ophthalmic solution Place 1 drop into both eyes at bedtime.     [provider]  levothyroxine (SYNTHROID) 100 MCG tablet Take 1 tablet (100 mcg total) by mouth daily before breakfast. 01/27/22   Ladell Pier, MD  lisinopril (ZESTRIL) 20 MG tablet Take 1 tablet by mouth once daily 03/03/22   Ladell Pier, MD  pravastatin (PRAVACHOL) 40 MG tablet Take 1 tablet (40 mg total) by mouth daily. 01/27/22   Ladell Pier, MD   sitaGLIPtin (JANUVIA) 100 MG tablet Take 1 tablet (100 mg total) by mouth daily. 01/27/22   Ladell Pier, MD    Family History Family History  Problem Relation Age of Onset   Asthma Other    Cancer Other    Hypertension Other    Diabetes Other     Social History Social History   Tobacco Use   Smoking status: Former    Types: Cigarettes    Quit date: 09/18/2001    Years since quitting: 20.5   Smokeless tobacco: Never  Substance Use Topics   Alcohol use: Yes   Drug use: No     Allergies   Farxiga [dapagliflozin], Latex, Metformin, Metformin and related, Penicillin g, Penicillins, Warfarin sodium, Warfarin sodium, and Iodinated contrast media   Review of Systems Review of Systems  HENT:  Positive for ear pain.      Physical Exam Triage Vital Signs ED Triage Vitals  Enc Vitals Group     BP 03/28/22 1242 117/64     Pulse Rate 03/28/22 1242 (!) 51     Resp 03/28/22 1242 18     Temp 03/28/22 1242 98.5 F (36.9 C)     Temp src --      SpO2 03/28/22 1242 99 %     Weight --      Height --      Head Circumference --      Peak Flow --      Pain Score 03/28/22 1240 10     Pain Loc --      Pain Edu? --      Excl. in [place]? --    No data found.  Updated Vital Signs BP 117/64   Pulse (!) 51   Temp 98.5 F (36.9 C)   Resp 18   SpO2 99%   Visual Acuity Right Eye Distance:   Left Eye Distance:   Bilateral Distance:    Right Eye Near:   Left Eye Near:    Bilateral Near:     Physical Exam Constitutional:      Appearance: Normal appearance. She is not ill-appearing.  HENT:     Right Ear: Tenderness present. No drainage. There is impacted cerumen. No mastoid tenderness.     Left Ear: Tympanic membrane, ear canal and external ear normal.     Ears:     Comments: No pain with pinna retraction but pain with tragal palpation and in the preauricular area down to submandibular area.  No lymphadenopathy.    Nose: No congestion.     Mouth/Throat:     Mouth:  Mucous membranes are moist.     Pharynx: Oropharynx is clear.  Neurological:     Mental Status: She is alert.      UC Treatments /  Results  Labs (all labs ordered are listed, but only abnormal results are displayed) Labs Reviewed - No data to display  EKG   Radiology No results found.  Procedures Procedures (including critical care time)  Medications Ordered in UC Medications - No data to display  Initial Impression / Assessment and Plan / UC Course  I have reviewed the triage vital signs and the nursing notes.  Pertinent labs & imaging results that were available during my care of the patient were reviewed by me and considered in my medical decision making (see chart for details).    We will attempted with curette and with ear irrigation.  Some debris removed but still cannot visualize TM.  Ear canal with edema, erythema, and discharge.  Given ear canal findings and tenderness on face, will treat with eardrops as well as oral antibiotics  Final Clinical Impressions(s) / UC Diagnoses   Final diagnoses:  Acute otitis externa of right ear, unspecified type     Discharge Instructions      Follow up with Dr. Wynetta Emery next week. I would call today to get an appointment.    ED Prescriptions     Medication Sig Dispense Auth. Provider   cefdinir (OMNICEF) 300 MG capsule Take 1 capsule (300 mg total) by mouth 2 (two) times daily. 14 capsule Carvel Getting, NP   ofloxacin (FLOXIN) 0.3 % OTIC solution Place 10 drops into the right ear daily. 5 mL Carvel Getting, NP      PDMP not reviewed this encounter.   Carvel Getting, NP 03/28/22 1405

## 2022-03-28 NOTE — Discharge Instructions (Signed)
Follow up with Dr. Wynetta Emery next week. I would call today to get an appointment.

## 2022-04-03 ENCOUNTER — Other Ambulatory Visit: Payer: Self-pay | Admitting: Internal Medicine

## 2022-04-03 DIAGNOSIS — E1169 Type 2 diabetes mellitus with other specified complication: Secondary | ICD-10-CM

## 2022-04-04 NOTE — Telephone Encounter (Signed)
Requesting refill too early. Requested Prescriptions  Refused Prescriptions Disp Refills  . glipiZIDE (GLUCOTROL) 10 MG tablet [Pharmacy Med Name: glipiZIDE 10 MG Oral Tablet] 180 tablet 0    Sig: TAKE 1 TABLET BY MOUTH TWICE DAILY BEFORE A MEAL     Endocrinology:  Diabetes - Sulfonylureas Failed - 04/03/2022  5:38 PM      Failed - Cr in normal range and within 360 days    Creat  Date Value Ref Range Status  07/19/2016 1.29 (H) 0.50 - 0.99 mg/dL Final    Comment:      For patients > or = 68 years of age: The upper reference limit for Creatinine is approximately 13% higher for people identified as African-American.      Creatinine, Ser  Date Value Ref Range Status  01/31/2022 1.20 (H) 0.57 - 1.00 mg/dL Final   Creatinine, Urine  Date Value Ref Range Status  03/19/2013 129.5 mg/dL Final         Passed - HBA1C is between 0 and 7.9 and within 180 days    HbA1c, POC (controlled diabetic range)  Date Value Ref Range Status  01/27/2022 7.4 (A) 0.0 - 7.0 % Final         Passed - Valid encounter within last 6 months    Recent Outpatient Visits          1 month ago Encounter for [employer] annual wellness exam   [place], Jarome Matin, RPH-CPP   2 months ago Type 2 diabetes mellitus with morbid obesity ([place])   [place], MD   6 months ago Viral laryngitis   [place], MD   10 months ago Type 2 diabetes mellitus with morbid obesity [facility])   [place] Ladell Pier, MD   1 year ago Encounter for [employer] annual wellness exam   [place], MD      Future Appointments            In 1 month Wynetta Emery, Dalbert Batman, MD [place]           . JANUVIA 100 MG tablet [Pharmacy Med Name: Januvia 100 MG Oral Tablet] 90 tablet 0     Sig: Take 1 tablet by mouth once daily     Endocrinology:  Diabetes - DPP-4 Inhibitors Failed - 04/03/2022  5:38 PM      Failed - Cr in normal range and within 360 days    Creat  Date Value Ref Range Status  07/19/2016 1.29 (H) 0.50 - 0.99 mg/dL Final    Comment:      For patients > or = 68 years of age: The upper reference limit for Creatinine is approximately 13% higher for people identified as African-American.      Creatinine, Ser  Date Value Ref Range Status  01/31/2022 1.20 (H) 0.57 - 1.00 mg/dL Final   Creatinine, Urine  Date Value Ref Range Status  03/19/2013 129.5 mg/dL Final         Passed - HBA1C is between 0 and 7.9 and within 180 days    HbA1c, POC (controlled diabetic range)  Date Value Ref Range Status  01/27/2022 7.4 (A) 0.0 - 7.0 % Final         Passed - Valid encounter within last 6 months  Recent Outpatient Visits          1 month ago Encounter for Medicare annual wellness exam   [place], RPH-CPP   2 months ago Type 2 diabetes mellitus with morbid obesity [facility])   [place], MD   6 months ago Viral laryngitis   [place], MD   10 months ago Type 2 diabetes mellitus with morbid obesity [facility])   [place] Ladell Pier, MD   1 year ago Encounter for [employer] annual wellness exam   [place], MD      Future Appointments            In 1 month Wynetta Emery Dalbert Batman, MD [place]

## 2022-04-26 ENCOUNTER — Other Ambulatory Visit: Payer: Self-pay | Admitting: Internal Medicine

## 2022-04-26 DIAGNOSIS — E890 Postprocedural hypothyroidism: Secondary | ICD-10-CM

## 2022-04-26 DIAGNOSIS — I10 Essential (primary) hypertension: Secondary | ICD-10-CM

## 2022-04-26 DIAGNOSIS — E1169 Type 2 diabetes mellitus with other specified complication: Secondary | ICD-10-CM

## 2022-04-26 MED ORDER — LEVOTHYROXINE SODIUM 100 MCG PO TABS: 100.0000 ug | ORAL_TABLET | Freq: Every day | ORAL | 1 refills | Status: DC

## 2022-04-26 MED ORDER — PRAVASTATIN SODIUM 40 MG PO TABS: 40.0000 mg | ORAL_TABLET | Freq: Every day | ORAL | 0 refills | Status: DC

## 2022-04-26 MED ORDER — LISINOPRIL 20 MG PO TABS: 20.0000 mg | ORAL_TABLET | Freq: Every day | ORAL | 0 refills | Status: DC

## 2022-04-26 MED ORDER — AMLODIPINE BESYLATE 10 MG PO TABS: 10.0000 mg | ORAL_TABLET | Freq: Every day | ORAL | 1 refills | Status: DC

## 2022-04-26 NOTE — Telephone Encounter (Signed)
Patient has changed to mail order pharmacy- remainder of Rx forwarded to mail order pharmacy Requested Prescriptions  Pending Prescriptions Disp Refills  . amLODipine (NORVASC) 10 MG tablet 90 tablet 1    Sig: Take 1 tablet (10 mg total) by mouth daily.     Cardiovascular: Calcium Channel Blockers 2 Passed - 04/26/2022  2:05 PM      Passed - Last BP in normal range    BP Readings from Last 1 Encounters:  03/28/22 117/64         Passed - Last Heart Rate in normal range    Pulse Readings from Last 1 Encounters:  03/28/22 (!) 51         Passed - Valid encounter within last 6 months    Recent Outpatient Visits          2 months ago Encounter for [employer] annual wellness exam   [place], RPH-CPP   2 months ago Type 2 diabetes mellitus with morbid obesity ([place])   [place], MD   7 months ago Viral laryngitis   [place], MD   11 months ago Type 2 diabetes mellitus with morbid obesity [facility])   [place] Ladell Pier, MD   1 year ago Encounter for [employer] annual wellness exam   [place], MD      Future Appointments            In 1 month Wynetta Emery, Dalbert Batman, MD [place]           . levothyroxine (SYNTHROID) 100 MCG tablet 90 tablet 1    Sig: Take 1 tablet (100 mcg total) by mouth daily before breakfast.     Endocrinology:  Hypothyroid Agents Passed - 04/26/2022  2:05 PM      Passed - TSH in normal range and within 360 days    TSH  Date Value Ref Range Status  01/31/2022 0.697 0.450 - 4.500 uIU/mL Final         Passed - Valid encounter within last 12 months    Recent Outpatient Visits          2 months ago Encounter for [employer] annual wellness exam   [place], Jarome Matin, RPH-CPP   2 months ago Type 2 diabetes mellitus with morbid obesity ([place])   [place], MD   7 months ago Viral laryngitis   [place], MD   11 months ago Type 2 diabetes mellitus with morbid obesity [facility])   [place] Ladell Pier, MD   1 year ago Encounter for [employer] annual wellness exam   [place], MD      Future Appointments            In 1 month Wynetta Emery, Dalbert Batman, MD [place]           . lisinopril (ZESTRIL) 20 MG tablet 90 tablet 1    Sig: Take 1 tablet (20 mg total) by mouth daily.     Cardiovascular:  ACE Inhibitors Failed - 04/26/2022  2:05 PM      Failed -  Cr in normal range and within 180 days    Creat  Date Value Ref Range Status  07/19/2016 1.29 (H) 0.50 - 0.99 mg/dL Final    Comment:      For patients > or = 68 years of age: The upper reference limit for Creatinine is approximately 13% higher for people identified as African-American.      Creatinine, Ser  Date Value Ref Range Status  01/31/2022 1.20 (H) 0.57 - 1.00 mg/dL Final   Creatinine, Urine  Date Value Ref Range Status  03/19/2013 129.5 mg/dL Final         Passed - K in normal range and within 180 days    Potassium  Date Value Ref Range Status  01/31/2022 4.6 3.5 - 5.2 mmol/L Final         Passed - Patient is not pregnant      Passed - Last BP in normal range    BP Readings from Last 1 Encounters:  03/28/22 117/64         Passed - Valid encounter within last 6 months    Recent Outpatient Visits          2 months ago Encounter for Medicare annual wellness exam   [place], Jarome Matin, RPH-CPP   2 months ago Type 2 diabetes mellitus with morbid obesity ([place])   [place]  Ladell Pier, MD   7 months ago Viral laryngitis   [place], MD   11 months ago Type 2 diabetes mellitus with morbid obesity [facility])   [place] Ladell Pier, MD   1 year ago Encounter for [employer] annual wellness exam   [place], MD      Future Appointments            In 1 month Wynetta Emery, Dalbert Batman, MD [place]           . pravastatin (PRAVACHOL) 40 MG tablet 90 tablet 0    Sig: Take 1 tablet (40 mg total) by mouth daily.     Cardiovascular:  Antilipid - Statins Failed - 04/26/2022  2:05 PM      Failed - Lipid Panel in normal range within the last 12 months    Cholesterol, Total  Date Value Ref Range Status  01/31/2022 124 100 - 199 mg/dL Final   LDL Chol Calc (NIH)  Date Value Ref Range Status  01/31/2022 68 0 - 99 mg/dL Final   HDL  Date Value Ref Range Status  01/31/2022 44 >39 mg/dL Final   Triglycerides  Date Value Ref Range Status  01/31/2022 54 0 - 149 mg/dL Final         Passed - Patient is not pregnant      Passed - Valid encounter within last 12 months    Recent Outpatient Visits          2 months ago Encounter for [employer] annual wellness exam   [place], RPH-CPP   2 months ago Type 2 diabetes mellitus with morbid obesity [facility])   [place] Ladell Pier, MD   7 months ago Viral laryngitis   [place] Karle Plumber B, MD   11 months ago Type 2 diabetes mellitus  with morbid obesity [facility])   [place] Ladell Pier, MD   1 year ago Encounter for [employer] annual wellness exam   [place], MD      Future Appointments            In 1 month Wynetta Emery, Dalbert Batman, MD [place]

## 2022-04-26 NOTE — Telephone Encounter (Signed)
Pt called in requesting a refill for a few medications. Pt says that she switched to mail order and is unsure if Rx request were sent.   levothyroxine (SYNTHROID) 100 MCG tablet - pt says that she has been with out this medication for days. No symptoms.    lisinopril (ZESTRIL) 20 MG tablet  amLODipine (NORVASC) 10 MG tablet pravastatin (PRAVACHOL) 40 MG tablet      Pharmacy:  [facility] ([facility]) - [place], [place] Phone:  [phone]  Fax:  [phone]      Future appt: 9/14

## 2022-05-09 ENCOUNTER — Other Ambulatory Visit: Payer: Self-pay | Admitting: Internal Medicine

## 2022-05-09 DIAGNOSIS — E890 Postprocedural hypothyroidism: Secondary | ICD-10-CM

## 2022-05-09 NOTE — Telephone Encounter (Unsigned)
Copied from [place] [id number]. Topic: General - Other >> May 09, 2022  9:32 AM Everette C wrote: Reason for CRM: Medication Refill - Medication: levothyroxine (SYNTHROID) 100 MCG tablet [[id number]]   Has the patient contacted their pharmacy? Yes.  The patient has been directed to contact their PCP  (Agent: If no, request that the patient contact the pharmacy for the refill. If patient does not wish to contact the pharmacy document the reason why and proceed with request.) (Agent: If yes, when and what did the pharmacy advise?)  Preferred Pharmacy (with phone number or street name): [facility] ([facility]) - [place], [address] [place] [address] Phone: [phone] Fax: [phone] Hours: Not open 24 hours  Has the patient been seen for an appointment in the last year OR does the patient have an upcoming appointment? Yes.    Agent: Please be advised that RX refills may take up to 3 business days. We ask that you follow-up with your pharmacy.

## 2022-05-10 NOTE — Telephone Encounter (Signed)
Refilled 04/26/22 # 90 with 1 refill. Requested Prescriptions  Refused Prescriptions Disp Refills  . levothyroxine (SYNTHROID) 100 MCG tablet 90 tablet 1    Sig: Take 1 tablet (100 mcg total) by mouth daily before breakfast.     Endocrinology:  Hypothyroid Agents Passed - 05/09/2022  9:58 AM      Passed - TSH in normal range and within 360 days    TSH  Date Value Ref Range Status  01/31/2022 0.697 0.450 - 4.500 uIU/mL Final         Passed - Valid encounter within last 12 months    Recent Outpatient Visits          2 months ago Encounter for [employer] annual wellness exam   [place], Jarome Matin, RPH-CPP   3 months ago Type 2 diabetes mellitus with morbid obesity ([place])   [place], MD   7 months ago Viral laryngitis   [place], MD   12 months ago Type 2 diabetes mellitus with morbid obesity [facility])   [place] Ladell Pier, MD   1 year ago Encounter for [employer] annual wellness exam   [place], MD      Future Appointments            In 3 weeks Ladell Pier, MD [place]

## 2022-05-12 ENCOUNTER — Other Ambulatory Visit: Payer: Self-pay | Admitting: Internal Medicine

## 2022-05-12 DIAGNOSIS — E1169 Type 2 diabetes mellitus with other specified complication: Secondary | ICD-10-CM

## 2022-05-15 NOTE — Telephone Encounter (Signed)
Requested Prescriptions  Pending Prescriptions Disp Refills  . JANUVIA 100 MG tablet [Pharmacy Med Name: Januvia 100 MG Oral Tablet] 100 tablet 0    Sig: TAKE 1 TABLET BY MOUTH DAILY     Endocrinology:  Diabetes - DPP-4 Inhibitors Failed - 05/12/2022 10:05 PM      Failed - Cr in normal range and within 360 days    Creat  Date Value Ref Range Status  07/19/2016 1.29 (H) 0.50 - 0.99 mg/dL Final    Comment:      For patients > or = 68 years of age: The upper reference limit for Creatinine is approximately 13% higher for people identified as African-American.      Creatinine, Ser  Date Value Ref Range Status  01/31/2022 1.20 (H) 0.57 - 1.00 mg/dL Final   Creatinine, Urine  Date Value Ref Range Status  03/19/2013 129.5 mg/dL Final         Passed - HBA1C is between 0 and 7.9 and within 180 days    HbA1c, POC (controlled diabetic range)  Date Value Ref Range Status  01/27/2022 7.4 (A) 0.0 - 7.0 % Final         Passed - Valid encounter within last 6 months    Recent Outpatient Visits          2 months ago Encounter for [employer] annual wellness exam   [place], Annie Main L, RPH-CPP   3 months ago Type 2 diabetes mellitus with morbid obesity ([place])   [place], MD   7 months ago Viral laryngitis   [place], Deborah B, MD   1 year ago Type 2 diabetes mellitus with morbid obesity [facility])   [place] Ladell Pier, MD   1 year ago Encounter for [employer] annual wellness exam   [place], MD      Future Appointments            In 2 weeks Ladell Pier, MD [place]           . glipiZIDE (GLUCOTROL) 10 MG tablet [Pharmacy Med Name: glipiZIDE 10 MG Oral Tablet] 200 tablet 0    Sig: TAKE 1 TABLET BY MOUTH TWICE  DAILY BEFORE  A MEAL     Endocrinology:  Diabetes - Sulfonylureas Failed - 05/12/2022 10:05 PM      Failed - Cr in normal range and within 360 days    Creat  Date Value Ref Range Status  07/19/2016 1.29 (H) 0.50 - 0.99 mg/dL Final    Comment:      For patients > or = 68 years of age: The upper reference limit for Creatinine is approximately 13% higher for people identified as African-American.      Creatinine, Ser  Date Value Ref Range Status  01/31/2022 1.20 (H) 0.57 - 1.00 mg/dL Final   Creatinine, Urine  Date Value Ref Range Status  03/19/2013 129.5 mg/dL Final         Passed - HBA1C is between 0 and 7.9 and within 180 days    HbA1c, POC (controlled diabetic range)  Date Value Ref Range Status  01/27/2022 7.4 (A) 0.0 - 7.0 % Final         Passed - Valid encounter within last 6 months    Recent Outpatient Visits  2 months ago Encounter for [employer] annual wellness exam   [place], RPH-CPP   3 months ago Type 2 diabetes mellitus with morbid obesity [facility])   [place], MD   7 months ago Viral laryngitis   [place], Deborah B, MD   1 year ago Type 2 diabetes mellitus with morbid obesity [facility])   [place] Ladell Pier, MD   1 year ago Encounter for [employer] annual wellness exam   [place], MD      Future Appointments            In 2 weeks Ladell Pier, MD [place]

## 2022-05-31 DIAGNOSIS — M79641 Pain in right hand: Secondary | ICD-10-CM | POA: Diagnosis not present

## 2022-05-31 DIAGNOSIS — G5603 Carpal tunnel syndrome, bilateral upper limbs: Secondary | ICD-10-CM | POA: Diagnosis not present

## 2022-05-31 DIAGNOSIS — M25562 Pain in left knee: Secondary | ICD-10-CM | POA: Diagnosis not present

## 2022-05-31 DIAGNOSIS — G5602 Carpal tunnel syndrome, left upper limb: Secondary | ICD-10-CM | POA: Diagnosis not present

## 2022-05-31 DIAGNOSIS — G5601 Carpal tunnel syndrome, right upper limb: Secondary | ICD-10-CM | POA: Diagnosis not present

## 2022-05-31 DIAGNOSIS — M79642 Pain in left hand: Secondary | ICD-10-CM | POA: Diagnosis not present

## 2022-06-01 ENCOUNTER — Ambulatory Visit: Payer: Medicare Other | Attending: Internal Medicine | Admitting: Internal Medicine

## 2022-06-01 ENCOUNTER — Encounter: Payer: Self-pay | Admitting: Internal Medicine

## 2022-06-01 DIAGNOSIS — E890 Postprocedural hypothyroidism: Secondary | ICD-10-CM | POA: Diagnosis not present

## 2022-06-01 DIAGNOSIS — E785 Hyperlipidemia, unspecified: Secondary | ICD-10-CM

## 2022-06-01 DIAGNOSIS — I152 Hypertension secondary to endocrine disorders: Secondary | ICD-10-CM

## 2022-06-01 DIAGNOSIS — E1169 Type 2 diabetes mellitus with other specified complication: Secondary | ICD-10-CM

## 2022-06-01 DIAGNOSIS — E1159 Type 2 diabetes mellitus with other circulatory complications: Secondary | ICD-10-CM

## 2022-06-01 DIAGNOSIS — G5603 Carpal tunnel syndrome, bilateral upper limbs: Secondary | ICD-10-CM

## 2022-06-01 LAB — POCT GLYCOSYLATED HEMOGLOBIN (HGB A1C): HbA1c, POC (controlled diabetic range): 6.8 % (ref 0.0–7.0)

## 2022-06-01 MED ORDER — ALBUTEROL SULFATE HFA 108 (90 BASE) MCG/ACT IN AERS: INHALATION_SPRAY | RESPIRATORY_TRACT | 2 refills | Status: DC

## 2022-06-01 NOTE — Progress Notes (Signed)
Patient ID: Kayla Bean, female    DOB: Aug 09, 1956  MRN: [id number]  CC: Diabetes   Subjective: Kayla Bean is a 66 y.o. female who presents for chronic ds management Her concerns today include:  Patient with history of diabetes, hypertension, HL, post thyroidectomy hypothyroidism (1973, noncancerous), asthma, CKD stage III, OA knee and obesity, glaucoma.  HM:  Got Flu and RSV vaccines at [facility] ([place]).    HTN: Reports compliance with taking amlodipine 10 mg and lisinopril 20 mg daily.  She limits salt in the foods.  No chest pains or shortness of breath.  HL:  taking and tolerating pravastatin 40 mg daily.  Most recent LDL was 68 which is at goal.  DM: Results for orders placed or performed in visit on 06/01/22  POCT glycosylated hemoglobin (Hb A1C)  Result Value Ref Range   Hemoglobin A1C     HbA1c POC (<> result, manual entry)     HbA1c, POC (prediabetic range)     HbA1c, POC (controlled diabetic range) 6.8 0.0 - 7.0 %  -feels she does well with eating habits. One large meal a day.  Eating more salads.   Not exercising, too boring to do it home but feels it is too hot to go outside.  On last visit she told me she would inquire whether her insurance covers for Silver sneakers so that she can go to the gym.  She did not do so Eye exam scheduled for next mth with [facility].  C/o numbness and tingling at the tips of fingers BL except for 5th digits and numbness in both arms x several mths.  Numbness when holding objects like her phone or when peeling a veggie. Some pain on palmer surface both wrists.  Told she likely has carpal tunnel syndrome and was given a trial of cock-up wrist splints which she wears at night.  No significant difference. Seeing ortho/sports med ([facility]) for this.  Had what sounds like a EMG study few days ago ordered by orthopedics.  Told she has carpal tunnel syndrome RT>LT but LT more symptomatic and bothersome.   She has follow-up with orthopedics on the 25th of this month to discuss management.    Thyroid: Taking and tolerating levothyroxine.  Last thyroid level was within normal range..    Patient Active Problem List   Diagnosis Date Noted   Controlled type 2 diabetes mellitus without complication, without long-term current use of insulin ([place]) 07/08/2020   Class 3 severe obesity with serious comorbidity and body mass index (BMI) of 45.0 to 49.9 in adult ([employer]) 10/01/2018   Moderate persistent asthma without complication 03/00/9233   Hyperlipidemia 03/22/2017   Cholelithiasis 12/24/2012   LIPOMA 07/26/2010   CONSTIPATION, CHRONIC 01/18/2009   EUSTACHIAN TUBE DYSFUNCTION, RIGHT 10/16/2008   LOM 09/01/2008   Essential hypertension 08/18/2008   Osteoarthritis of left knee 04/23/2008   Hypothyroidism 10/15/2007     Current Outpatient Medications on File Prior to Visit  Medication Sig Dispense Refill   albuterol (VENTOLIN HFA) 108 (90 Base) MCG/ACT inhaler INHALE 2 PUFFS BY MOUTH EVERY 6 HOURS AS NEEDED FOR WHEEZING FOR SHORTNESS OF BREATH 18 g 0   amLODipine (NORVASC) 10 MG tablet Take 1 tablet (10 mg total) by mouth daily. 90 tablet 1   aspirin 81 MG tablet Take 1 tablet (81 mg total) daily by mouth. 90 tablet 0   Blood Glucose Monitoring Suppl (ACCU-CHEK GUIDE ME) w/Device KIT 1 Device by Does not apply  route daily. 1 kit 0   brimonidine-timolol (COMBIGAN) 0.2-0.5 % ophthalmic solution Place 1 drop into both eyes every 12 (twelve) hours.     cefdinir (OMNICEF) 300 MG capsule Take 1 capsule (300 mg total) by mouth 2 (two) times daily. 14 capsule 0   cycloSPORINE (RESTASIS) 0.05 % ophthalmic emulsion Place 1 drop into both eyes 2 (two) times daily.     glipiZIDE (GLUCOTROL) 10 MG tablet TAKE 1 TABLET BY MOUTH TWICE  DAILY BEFORE A MEAL 200 tablet 0   glucose blood (ACCU-CHEK GUIDE) test strip Use as instructed 100 each 12   JANUVIA 100 MG tablet TAKE 1 TABLET BY MOUTH DAILY 100 tablet 0   Lancet  Devices (ACCU-CHEK SOFTCLIX) lancets Use as instructed for 3 times daily testing of blood glucose 1 each 0   Lancets Misc. (ACCU-CHEK FASTCLIX LANCET) KIT Use as directed 1 kit 12   latanoprost (XALATAN) 0.005 % ophthalmic solution Place 1 drop into both eyes at bedtime.      levothyroxine (SYNTHROID) 100 MCG tablet Take 1 tablet (100 mcg total) by mouth daily before breakfast. 90 tablet 1   lisinopril (ZESTRIL) 20 MG tablet Take 1 tablet (20 mg total) by mouth daily. 90 tablet 0   ofloxacin (FLOXIN) 0.3 % OTIC solution Place 10 drops into the right ear daily. 5 mL 0   pravastatin (PRAVACHOL) 40 MG tablet Take 1 tablet (40 mg total) by mouth daily. 90 tablet 0   No current facility-administered medications on file prior to visit.    Allergies  Allergen Reactions   Farxiga [Dapagliflozin] Other (See Comments)    Recurrent vaginal yeast   Latex Hives and Other (See Comments)   Metformin     Other reaction(s): hair loss   Metformin And Related     Hair loss   Penicillin G Other (See Comments)   Penicillins Hives   Warfarin Sodium Hives    REACTION: Got rash when took one pill of coumadin  related to knee replacement surgery in 2007.   Warfarin Sodium Other (See Comments)   Iodinated Contrast Media Other (See Comments)    PT STATES SHE HAD AN ALLERGIC REACTION OF BLISTERS ON HANDS AND FEET 2 DAYS AFTER CT SCAN W/ IV CONTRAST INJECTION, DR. MANSELL SUGGESTS 13 HR PREP//A.C.    Social History   Socioeconomic History   Marital status: Single    Spouse name: Not on file   Number of children: Not on file   Years of education: Not on file   Highest education level: Not on file  Occupational History   Not on file  Tobacco Use   Smoking status: Former    Types: Cigarettes    Quit date: 09/18/2001    Years since quitting: 20.7   Smokeless tobacco: Never  Substance and Sexual Activity   Alcohol use: Yes   Drug use: No   Sexual activity: Not Currently  Other Topics Concern   Not on  file  Social History Narrative   Not on file   Social Determinants of Health   Financial Resource Strain: Not on file  Food Insecurity: Not on file  Transportation Needs: Not on file  Physical Activity: Not on file  Stress: Not on file  Social Connections: Not on file  Intimate Partner Violence: Not on file    Family History  Problem Relation Age of Onset   Asthma Other    Cancer Other    Hypertension Other    Diabetes Other  Past Surgical History:  Procedure Laterality Date   ABDOMINAL HYSTERECTOMY     CHOLECYSTECTOMY N/A 02/24/2019   Procedure: LAPAROSCOPIC CHOLECYSTECTOMY WITH INTRAOPERATIVE CHOLANGIOGRAM;  Surgeon: Donnie Mesa, MD;  Location: [facility];  Service: General;  Laterality: N/A;   KNEE SURGERY     left   THYROIDECTOMY     TOTAL KNEE ARTHROPLASTY     right knee    ROS: Review of Systems Negative except as stated above  PHYSICAL EXAM: BP 124/64   Pulse 64   Temp 98.4 F (36.9 C) (Oral)   Wt 246 lb (111.6 kg)   SpO2 98%   BMI 44.99 kg/m   Wt Readings from Last 3 Encounters:  06/01/22 246 lb (111.6 kg)  02/23/22 246 lb 12.8 oz (111.9 kg)  01/27/22 243 lb (110.2 kg)    Physical Exam  General appearance - alert, well appearing, older African-American female and in no distress Mental status - normal mood, behavior, speech, dress, motor activity, and thought processes Neck - supple, no significant adenopathy Chest - clear to auscultation, no wheezes, rales or rhonchi, symmetric air entry Heart - normal rate, regular rhythm, normal S1, S2, no murmurs, rubs, clicks or gallops Extremities - peripheral pulses normal, no pedal edema, no clubbing or cyanosis Hands: Grip 5/5 laterally.  No intrinsic muscle wasting.     Latest Ref Rng & Units 01/31/2022   11:58 AM 05/12/2021    4:45 PM 07/08/2020    4:09 PM  CMP  Glucose 70 - 99 mg/dL 145  122  111   BUN 8 - 27 mg/dL _0 Creatinine 0.57 - 1.00 mg/dL 1.20  1.06  1.01   Sodium 134 - 144  mmol/L 141  136  140   Potassium 3.5 - 5.2 mmol/L 4.6  4.3  4.3   Chloride 96 - 106 mmol/L 104  100  102   CO2 20 - 29 mmol/L _1 Calcium 8.7 - 10.3 mg/dL 9.4  9.7  9.6   Total Protein 6.0 - 8.5 g/dL 7.6  8.3  8.6   Total Bilirubin 0.0 - 1.2 mg/dL 0.3  0.4  0.5   Alkaline Phos 44 - 121 IU/L 121  122  88   AST 0 - 40 IU/L _2 ALT 0 - 32 IU/L _3 Lipid Panel     Component Value Date/Time   CHOL 124 01/31/2022 1158   TRIG 54 01/31/2022 1158   HDL 44 01/31/2022 1158   CHOLHDL 2.8 01/31/2022 1158   CHOLHDL 3.6 03/14/2016 1238   VLDL 12 03/14/2016 1238   LDLCALC 68 01/31/2022 1158    CBC    Component Value Date/Time   WBC 9.5 01/31/2022 1158   WBC 9.5 10/22/2019 1113   RBC 4.25 01/31/2022 1158   RBC 4.25 10/22/2019 1113   HGB 13.2 01/31/2022 1158   HCT 38.4 01/31/2022 1158   PLT 273 01/31/2022 1158   MCV 90 01/31/2022 1158   MCH 31.1 01/31/2022 1158   MCH 30.8 10/22/2019 1113   MCHC 34.4 01/31/2022 1158   MCHC 33.5 10/22/2019 1113   RDW 12.8 01/31/2022 1158   LYMPHSABS 1.8 10/22/2019 1113   MONOABS 0.4 10/22/2019 1113   EOSABS 0.4 10/22/2019 1113   BASOSABS 0.1 10/22/2019 1113    ASSESSMENT AND PLAN:  1. Type 2 diabetes mellitus with morbid obesity (HCC) At goal. Commended her on  trying to eat better. Encouraged her to move more.  Discussed ways she can exercise at home like walking in place while watching a TV program.  Continue Januvia and glipizide. - POCT glycosylated hemoglobin (Hb A1C)  2. Hypertension associated with diabetes ([place]) At goal.  Continue amlodipine and lisinopril.  3. Hyperlipidemia associated with type 2 diabetes mellitus ([place]) Continue Pravachol.  LDL at goal.  4. Postoperative hypothyroidism Continue levothyroxine.  5. Bilateral carpal tunnel syndrome Patient was wondering whether she can wear the cock-up wrist splints during the day.  States she was told by Ortho to wear them at night.  I told her that she  can wear them during the day as well as long as it does not interfere or get in the way of her activities that she has to perform with her hands.    Patient was given the opportunity to ask questions.  Patient verbalized understanding of the plan and was able to repeat key elements of the plan.   This documentation was completed using Radio producer.  Any transcriptional errors are unintentional.  No orders of the defined types were placed in this encounter.    Requested Prescriptions    No prescriptions requested or ordered in this encounter    No follow-ups on file.  Karle Plumber, MD, FACP

## 2022-06-12 DIAGNOSIS — G5601 Carpal tunnel syndrome, right upper limb: Secondary | ICD-10-CM | POA: Diagnosis not present

## 2022-06-28 ENCOUNTER — Other Ambulatory Visit: Payer: Self-pay | Admitting: Internal Medicine

## 2022-06-28 DIAGNOSIS — E1169 Type 2 diabetes mellitus with other specified complication: Secondary | ICD-10-CM

## 2022-06-29 DIAGNOSIS — G5601 Carpal tunnel syndrome, right upper limb: Secondary | ICD-10-CM | POA: Diagnosis not present

## 2022-07-03 ENCOUNTER — Other Ambulatory Visit: Payer: Self-pay | Admitting: Internal Medicine

## 2022-07-03 DIAGNOSIS — I10 Essential (primary) hypertension: Secondary | ICD-10-CM

## 2022-07-03 DIAGNOSIS — E890 Postprocedural hypothyroidism: Secondary | ICD-10-CM

## 2022-07-07 DIAGNOSIS — M1712 Unilateral primary osteoarthritis, left knee: Secondary | ICD-10-CM | POA: Diagnosis not present

## 2022-07-11 ENCOUNTER — Other Ambulatory Visit: Payer: Self-pay | Admitting: Internal Medicine

## 2022-07-11 DIAGNOSIS — I10 Essential (primary) hypertension: Secondary | ICD-10-CM

## 2022-07-30 ENCOUNTER — Other Ambulatory Visit: Payer: Self-pay | Admitting: Internal Medicine

## 2022-07-31 NOTE — Telephone Encounter (Signed)
Requested Prescriptions  Pending Prescriptions Disp Refills   glipiZIDE (GLUCOTROL) 10 MG tablet [Pharmacy Med Name: glipiZIDE 10 MG Oral Tablet] 200 tablet 1    Sig: TAKE 1 TABLET BY MOUTH TWICE  DAILY BEFORE MEALS     Endocrinology:  Diabetes - Sulfonylureas Failed - 07/30/2022 10:16 PM      Failed - Cr in normal range and within 360 days    Creat  Date Value Ref Range Status  07/19/2016 1.29 (H) 0.50 - 0.99 mg/dL Final    Comment:      For patients > or = 68 years of age: The upper reference limit for Creatinine is approximately 13% higher for people identified as African-American.      Creatinine, Ser  Date Value Ref Range Status  01/31/2022 1.20 (H) 0.57 - 1.00 mg/dL Final   Creatinine, Urine  Date Value Ref Range Status  03/19/2013 129.5 mg/dL Final         Passed - HBA1C is between 0 and 7.9 and within 180 days    HbA1c, POC (controlled diabetic range)  Date Value Ref Range Status  06/01/2022 6.8 0.0 - 7.0 % Final         Passed - Valid encounter within last 6 months    Recent Outpatient Visits           2 months ago Type 2 diabetes mellitus with morbid obesity ([place])   [place], MD   5 months ago Encounter for [employer] annual wellness exam   [place], Annie Main L, RPH-CPP   6 months ago Type 2 diabetes mellitus with morbid obesity [facility])   [place] Lad[place], MD   10 months ago Viral laryngitis   [place], Deborah B, MD   1 year ago Type 2 diabetes mellitus with morbid obesity [facility])   [place], MD       Future Appointments             In 2 months Wynetta Emery, Dalbert Batman, MD [place] 100 MG tablet [Pharmacy Med Name: Januvia 100 MG Oral Tablet] 100 tablet 2    Sig: TAKE 1  TABLET BY MOUTH DAILY     Endocrinology:  Diabetes - DPP-4 Inhibitors Failed - 07/30/2022 10:16 PM      Failed - Cr in normal range and within 360 days    Creat  Date Value Ref Range Status  07/19/2016 1.29 (H) 0.50 - 0.99 mg/dL Final    Comment:      For patients > or = 68 years of age: The upper reference limit for Creatinine is approximately 13% higher for people identified as African-American.      Creatinine, Ser  Date Value Ref Range Status  01/31/2022 1.20 (H) 0.57 - 1.00 mg/dL Final   Creatinine, Urine  Date Value Ref Range Status  03/19/2013 129.5 mg/dL Final         Passed - HBA1C is between 0 and 7.9 and within 180 days    HbA1c, POC (controlled diabetic range)  Date Value Ref Range Status  06/01/2022 6.8 0.0 - 7.0 % Final         Passed - Valid encounter within last 6 months    Recent Outpatient  Visits           2 months ago Type 2 diabetes mellitus with morbid obesity ([place])   [place], MD   5 months ago Encounter for [employer] annual wellness exam   [place], Jarome Matin, RPH-CPP   6 months ago Type 2 diabetes mellitus with morbid obesity [facility])   [place], MD   10 months ago Viral laryngitis   [place], Deborah B, MD   1 year ago Type 2 diabetes mellitus with morbid obesity [facility])   [place], MD       Future Appointments             In 2 months Wynetta Emery Dalbert Batman, MD [place]

## 2022-09-26 DIAGNOSIS — M1712 Unilateral primary osteoarthritis, left knee: Secondary | ICD-10-CM | POA: Diagnosis not present

## 2022-09-28 ENCOUNTER — Other Ambulatory Visit: Payer: Self-pay | Admitting: Internal Medicine

## 2022-09-28 DIAGNOSIS — Z1231 Encounter for screening mammogram for malignant neoplasm of breast: Secondary | ICD-10-CM

## 2022-09-29 ENCOUNTER — Ambulatory Visit
Admission: RE | Admit: 2022-09-29 | Discharge: 2022-09-29 | Disposition: A | Discharge status: Home / Self Care | Payer: 59 | Source: Ambulatory Visit | Attending: Internal Medicine | Admitting: Internal Medicine

## 2022-09-29 DIAGNOSIS — Z1231 Encounter for screening mammogram for malignant neoplasm of breast: Secondary | ICD-10-CM | POA: Diagnosis not present

## 2022-10-02 ENCOUNTER — Ambulatory Visit: Payer: 59 | Attending: Internal Medicine | Admitting: Internal Medicine

## 2022-10-02 ENCOUNTER — Encounter: Payer: Self-pay | Admitting: Internal Medicine

## 2022-10-02 DIAGNOSIS — E1169 Type 2 diabetes mellitus with other specified complication: Secondary | ICD-10-CM | POA: Diagnosis not present

## 2022-10-02 DIAGNOSIS — E785 Hyperlipidemia, unspecified: Secondary | ICD-10-CM

## 2022-10-02 DIAGNOSIS — J4520 Mild intermittent asthma, uncomplicated: Secondary | ICD-10-CM

## 2022-10-02 DIAGNOSIS — E890 Postprocedural hypothyroidism: Secondary | ICD-10-CM

## 2022-10-02 DIAGNOSIS — I152 Hypertension secondary to endocrine disorders: Secondary | ICD-10-CM

## 2022-10-02 DIAGNOSIS — N1831 Chronic kidney disease, stage 3a: Secondary | ICD-10-CM | POA: Diagnosis not present

## 2022-10-02 DIAGNOSIS — E1159 Type 2 diabetes mellitus with other circulatory complications: Secondary | ICD-10-CM

## 2022-10-02 DIAGNOSIS — Z6841 Body Mass Index (BMI) 40.0 and over, adult: Secondary | ICD-10-CM

## 2022-10-02 LAB — GLUCOSE, POCT (MANUAL RESULT ENTRY): POC Glucose: 125 mg/dl — AB (ref 70–99)

## 2022-10-02 LAB — POCT GLYCOSYLATED HEMOGLOBIN (HGB A1C): HbA1c, POC (controlled diabetic range): 7.1 % — AB (ref 0.0–7.0)

## 2022-10-02 MED ORDER — ALBUTEROL SULFATE HFA 108 (90 BASE) MCG/ACT IN AERS: 2.0000 | INHALATION_SPRAY | Freq: Four times a day (QID) | RESPIRATORY_TRACT | 5 refills | Status: DC | PRN

## 2022-10-02 NOTE — Progress Notes (Signed)
Patient ID: Kayla Bean, female    DOB: [birth year]  MRN: [id number]  CC: Diabetes   Subjective: Kayla Bean is a 67 y.o. female who presents for chronic ds management Her concerns today include:  Patient with history of diabetes, hypertension, HL, post thyroidectomy hypothyroidism (1973, noncancerous), asthma, CKD stage III, OA knee and obesity, glaucoma.   DM: Results for orders placed or performed in visit on 10/02/22  POCT glucose (manual entry)  Result Value Ref Range   POC Glucose 125 (A) 70 - 99 mg/dl  POCT glycosylated hemoglobin (Hb A1C)  Result Value Ref Range   Hemoglobin A1C     HbA1c POC (<> result, manual entry)     HbA1c, POC (prediabetic range)     HbA1c, POC (controlled diabetic range) 7.1 (A) 0.0 - 7.0 %  -A1c increased from 6.8 on last visit Admits to dietary indiscretions over the holidays.  She spent a month in [place] with her daughter and grandkids.  She is getting back on track now that she is back home.  She does not exercise despite having a treadmill, a gizzell and small weights.  Reports she isin her living room to set up the treadmill.  Weight has remained stable since last visit.  Reports compliance with glipizide and Januvia.  Checks blood sugars intermittently.  Blood sugar this morning was 158. -Has eye appointment with [facility] on 10/09/2022. -Compliant with taking Norvasc 10 mg daily and lisinopril 20 mg daily for blood pressure.  She does not use much salt in the foods.  No chest pains or shortness of breath. Compliant with Pravachol for the cholesterol.  Taking and tolerating the medication. Requests refill on ProAir inhaler to use as needed for asthma.  She does not have to use the inhaler every day.  She has noted some mild congestion.  She has been taking Coricidin HBP.  She does not feel that it helps. She reports compliance with taking her thyroid medicine levothyroxine 100 mcg daily. Had [facility] shot [facility] in  October.    Patient Active Problem List   Diagnosis Date Noted   Controlled type 2 diabetes mellitus without complication, without long-term current use of insulin ([place]) 07/08/2020   Class 3 severe obesity with serious comorbidity and body mass index (BMI) of 45.0 to 49.9 in adult ([place]) 10/01/2018   Moderate persistent asthma without complication 00/93/8182   Hyperlipidemia 03/22/2017   Cholelithiasis 12/24/2012   LIPOMA 07/26/2010   CONSTIPATION, CHRONIC 01/18/2009   EUSTACHIAN TUBE DYSFUNCTION, RIGHT 10/16/2008   LOM 09/01/2008   Essential hypertension 08/18/2008   Osteoarthritis of left knee 04/23/2008   Hypothyroidism 10/15/2007     Current Outpatient Medications on File Prior to Visit  Medication Sig Dispense Refill   amLODipine (NORVASC) 10 MG tablet TAKE 1 TABLET BY MOUTH DAILY 100 tablet 1   aspirin 81 MG tablet Take 1 tablet (81 mg total) daily by mouth. 90 tablet 0   Blood Glucose Monitoring Suppl (ACCU-CHEK GUIDE ME) w/Device KIT 1 Device by Does not apply route daily. 1 kit 0   brimonidine-timolol (COMBIGAN) 0.2-0.5 % ophthalmic solution Place 1 drop into both eyes every 12 (twelve) hours.     cefdinir (OMNICEF) 300 MG capsule Take 1 capsule (300 mg total) by mouth 2 (two) times daily. 14 capsule 0   cycloSPORINE (RESTASIS) 0.05 % ophthalmic emulsion Place 1 drop into both eyes 2 (two) times daily.     glipiZIDE (GLUCOTROL) 10 MG tablet TAKE 1  TABLET BY MOUTH TWICE  DAILY BEFORE MEALS 200 tablet 1   glucose blood (ACCU-CHEK GUIDE) test strip Use as instructed 100 each 12   Lancet Devices (ACCU-CHEK SOFTCLIX) lancets Use as instructed for 3 times daily testing of blood glucose 1 each 0   Lancets Misc. (ACCU-CHEK FASTCLIX LANCET) KIT Use as directed 1 kit 12   latanoprost (XALATAN) 0.005 % ophthalmic solution Place 1 drop into both eyes at bedtime.      levothyroxine (SYNTHROID) 100 MCG tablet TAKE 1 TABLET BY MOUTH DAILY  BEFORE BREAKFAST 100 tablet 1   lisinopril  (ZESTRIL) 20 MG tablet TAKE 1 TABLET BY MOUTH DAILY 100 tablet 1   ofloxacin (FLOXIN) 0.3 % OTIC solution Place 10 drops into the right ear daily. 5 mL 0   pravastatin (PRAVACHOL) 40 MG tablet TAKE 1 TABLET BY MOUTH DAILY 100 tablet 2   sitaGLIPtin (JANUVIA) 100 MG tablet TAKE 1 TABLET BY MOUTH DAILY 100 tablet 1   No current facility-administered medications on file prior to visit.    Allergies  Allergen Reactions   Farxiga [Dapagliflozin] Other (See Comments)    Recurrent vaginal yeast   Latex Hives and Other (See Comments)   Metformin     Other reaction(s): hair loss   Metformin And Related     Hair loss   Penicillin G Other (See Comments)   Penicillins Hives   Warfarin Sodium Hives    REACTION: Got rash when took one pill of coumadin  related to knee replacement surgery in 2007.   Warfarin Sodium Other (See Comments)   Iodinated Contrast Media Other (See Comments)    PT STATES SHE HAD AN ALLERGIC REACTION OF BLISTERS ON HANDS AND FEET 2 DAYS AFTER CT SCAN W/ IV CONTRAST INJECTION, DR. MANSELL SUGGESTS 13 HR PREP//A.C.    Social History   Socioeconomic History   Marital status: Single    Spouse name: Not on file   Number of children: Not on file   Years of education: Not on file   Highest education level: Not on file  Occupational History   Not on file  Tobacco Use   Smoking status: Former    Types: Cigarettes    Quit date: 09/18/2001    Years since quitting: 21.0   Smokeless tobacco: Never  Substance and Sexual Activity   Alcohol use: Yes   Drug use: No   Sexual activity: Not Currently  Other Topics Concern   Not on file  Social History Narrative   Not on file   Social Determinants of Health   Financial Resource Strain: Not on file  Food Insecurity: Not on file  Transportation Needs: Not on file  Physical Activity: Not on file  Stress: Not on file  Social Connections: Not on file  Intimate Partner Violence: Not on file    Family History  Problem  Relation Age of Onset   Asthma Other    Cancer Other    Hypertension Other    Diabetes Other     Past Surgical History:  Procedure Laterality Date   ABDOMINAL HYSTERECTOMY     CHOLECYSTECTOMY N/A 02/24/2019   Procedure: LAPAROSCOPIC CHOLECYSTECTOMY WITH INTRAOPERATIVE CHOLANGIOGRAM;  Surgeon: Donnie Mesa, MD;  Location: [place];  Service: General;  Laterality: N/A;   KNEE SURGERY     left   THYROIDECTOMY     TOTAL KNEE ARTHROPLASTY     right knee    ROS: Review of Systems Negative except as stated above  PHYSICAL EXAM: BP Marland Kitchen)  140/70   Pulse 61   Wt 244 lb (110.7 kg)   SpO2 99%   BMI 44.63 kg/m   Wt Readings from Last 3 Encounters:  10/02/22 244 lb (110.7 kg)  06/01/22 246 lb (111.6 kg)  02/23/22 246 lb 12.8 oz (111.9 kg)    Physical Exam  General appearance - alert, well appearing, and in no distress Mental status - normal mood, behavior, speech, dress, motor activity, and thought processes Mouth - mucous membranes moist, pharynx normal without lesions Chest - clear to auscultation, no wheezes, rales or rhonchi, symmetric air entry Heart - normal rate, regular rhythm, normal S1, S2, no murmurs, rubs, clicks or gallops Extremities - peripheral pulses normal, no pedal edema, no clubbing or cyanosis      Latest Ref Rng & Units 01/31/2022   11:58 AM 05/12/2021    4:45 PM 07/08/2020    4:09 PM  CMP  Glucose 70 - 99 mg/dL 145  122  111   BUN 8 - 27 mg/dL '11  13  11   '$ Creatinine 0.57 - 1.00 mg/dL 1.20  1.06  1.01   Sodium 134 - 144 mmol/L 141  136  140   Potassium 3.5 - 5.2 mmol/L 4.6  4.3  4.3   Chloride 96 - 106 mmol/L 104  100  102   CO2 20 - 29 mmol/L '23  22  25   '$ Calcium 8.7 - 10.3 mg/dL 9.4  9.7  9.6   Total Protein 6.0 - 8.5 g/dL 7.6  8.3  8.6   Total Bilirubin 0.0 - 1.2 mg/dL 0.3  0.4  0.5   Alkaline Phos 44 - 121 IU/L 121  122  88   AST 0 - 40 IU/L '14  19  19   '$ ALT 0 - 32 IU/L '13  16  13    '$ Lipid Panel     Component Value Date/Time   CHOL 124  01/31/2022 1158   TRIG 54 01/31/2022 1158   HDL 44 01/31/2022 1158   CHOLHDL 2.8 01/31/2022 1158   CHOLHDL 3.6 03/14/2016 1238   VLDL 12 03/14/2016 1238   LDLCALC 68 01/31/2022 1158    CBC    Component Value Date/Time   WBC 9.5 01/31/2022 1158   WBC 9.5 10/22/2019 1113   RBC 4.25 01/31/2022 1158   RBC 4.25 10/22/2019 1113   HGB 13.2 01/31/2022 1158   HCT 38.4 01/31/2022 1158   PLT 273 01/31/2022 1158   MCV 90 01/31/2022 1158   MCH 31.1 01/31/2022 1158   MCH 30.8 10/22/2019 1113   MCHC 34.4 01/31/2022 1158   MCHC 33.5 10/22/2019 1113   RDW 12.8 01/31/2022 1158   LYMPHSABS 1.8 10/22/2019 1113   MONOABS 0.4 10/22/2019 1113   EOSABS 0.4 10/22/2019 1113   BASOSABS 0.1 10/22/2019 1113    ASSESSMENT AND PLAN:  1. Type 2 diabetes mellitus with morbid obesity ([place]) Close to goal. Commended her on getting back on track with her eating habits. Strongly encouraged her to try to exercise.  If she is unable to set up a treadmill, she can do walking in place for about 10-15 mins daily while watching TV.  Patient is agreeable to doing this.  Continue current dose of Januvia and glipizide. - POCT glucose (manual entry) - POCT glycosylated hemoglobin (Hb A1C)  2. Hypertension associated with diabetes ([place]) Not at goal. Continue Norvasc 10 mg daily and lisinopril 20 mg daily.  Discussed adding low-dose of HCTZ but patient declined adding any new  medication.  Continue DASH  3. Hyperlipidemia associated with type 2 diabetes mellitus (HCC) Continue Pravachol.  Last LDL was at goal at 68.  4. Postoperative hypothyroidism Continue levothyroxine.  5. Stage 3a chronic kidney disease ([place]) Continue to avoid NSAIDs.  She is on Celebrex through her orthopedic specialist but patient states she takes it only a few times a week. - Basic Metabolic Panel; Future  6. Mild intermittent asthma without complication Stable.  Recommend using some Vicks vapor rub over-the-counter for mild  congestion. - albuterol (PROAIR HFA) 108 (90 Base) MCG/ACT inhaler; Inhale 2 puffs into the lungs every 6 (six) hours as needed for wheezing or shortness of breath.  Dispense: 18 g; Refill: 5    Patient was given the opportunity to ask questions.  Patient verbalized understanding of the plan and was able to repeat key elements of the plan.   This documentation was completed using Radio producer.  Any transcriptional errors are unintentional.  Orders Placed This Encounter  Procedures   Basic Metabolic Panel   POCT glucose (manual entry)   POCT glycosylated hemoglobin (Hb A1C)     Requested Prescriptions   Signed Prescriptions Disp Refills   albuterol (PROAIR HFA) 108 (90 Base) MCG/ACT inhaler 18 g 5    Sig: Inhale 2 puffs into the lungs every 6 (six) hours as needed for wheezing or shortness of breath.    Return in about 4 months (around 01/31/2023) for Give lab appt for this week.  Karle Plumber, MD, FACP

## 2022-10-09 ENCOUNTER — Ambulatory Visit: Payer: 59 | Attending: Internal Medicine

## 2022-10-09 DIAGNOSIS — H35033 Hypertensive retinopathy, bilateral: Secondary | ICD-10-CM | POA: Diagnosis not present

## 2022-10-09 DIAGNOSIS — Z961 Presence of intraocular lens: Secondary | ICD-10-CM | POA: Diagnosis not present

## 2022-10-09 DIAGNOSIS — H02535 Eyelid retraction left lower eyelid: Secondary | ICD-10-CM | POA: Diagnosis not present

## 2022-10-09 DIAGNOSIS — E890 Postprocedural hypothyroidism: Secondary | ICD-10-CM

## 2022-10-09 DIAGNOSIS — E119 Type 2 diabetes mellitus without complications: Secondary | ICD-10-CM | POA: Diagnosis not present

## 2022-10-09 DIAGNOSIS — H401122 Primary open-angle glaucoma, left eye, moderate stage: Secondary | ICD-10-CM | POA: Diagnosis not present

## 2022-10-09 DIAGNOSIS — H401111 Primary open-angle glaucoma, right eye, mild stage: Secondary | ICD-10-CM | POA: Diagnosis not present

## 2022-10-09 DIAGNOSIS — H02532 Eyelid retraction right lower eyelid: Secondary | ICD-10-CM | POA: Diagnosis not present

## 2022-10-09 DIAGNOSIS — N1831 Chronic kidney disease, stage 3a: Secondary | ICD-10-CM | POA: Diagnosis not present

## 2022-10-10 LAB — BASIC METABOLIC PANEL
BUN/Creatinine Ratio: 13 (ref 12–28)
BUN: 14 mg/dL (ref 8–27)
CO2: 24 mmol/L (ref 20–29)
Calcium: 9.3 mg/dL (ref 8.7–10.3)
Chloride: 101 mmol/L (ref 96–106)
Creatinine, Ser: 1.05 mg/dL — ABNORMAL HIGH (ref 0.57–1.00)
Glucose: 151 mg/dL — ABNORMAL HIGH (ref 70–99)
Potassium: 4.4 mmol/L (ref 3.5–5.2)
Sodium: 140 mmol/L (ref 134–144)
eGFR: 58 mL/min/{1.73_m2} — ABNORMAL LOW (ref >59)

## 2022-10-10 LAB — TSH: TSH: 1.95 u[IU]/mL (ref 0.450–4.500)

## 2022-10-20 ENCOUNTER — Ambulatory Visit: Payer: Self-pay | Admitting: *Deleted

## 2022-10-20 ENCOUNTER — Other Ambulatory Visit: Payer: Self-pay | Admitting: Pharmacist

## 2022-10-20 MED ORDER — PROAIR RESPICLICK 108 (90 BASE) MCG/ACT IN AEPB: 2.0000 | INHALATION_SPRAY | Freq: Four times a day (QID) | RESPIRATORY_TRACT | 3 refills | Status: DC | PRN

## 2022-10-20 NOTE — Telephone Encounter (Signed)
Pt given lab results per notes of Dr. Wynetta Emery from 10/10/22 on 10/20/22. Pt verbalized understanding and to continue current dose of levothyroxine. Patient requesting if PCP can order (PROAIR) inhaler again. [facility] sent her albuterol inhaler and patient usually gets "respi click" Proair inhaler that works better for her. Reports "not the right one". Last ordered on 10/02/22. Recommended patient contact [facility] and review medication she is requesting as well. Please advise.

## 2022-10-25 ENCOUNTER — Telehealth: Payer: Self-pay | Admitting: Internal Medicine

## 2022-10-25 MED ORDER — ALBUTEROL SULFATE HFA 108 (90 BASE) MCG/ACT IN AERS: 2.0000 | INHALATION_SPRAY | Freq: Four times a day (QID) | RESPIRATORY_TRACT | 2 refills | Status: AC | PRN

## 2022-10-26 NOTE — Telephone Encounter (Signed)
Called & spoke to the patient. Verified name & DOB. Informed of prescription change due to insurance coverage. Made aware that prescription has been sent to her pharmacy. Patient expressed verbal understanding.

## 2022-12-26 ENCOUNTER — Other Ambulatory Visit: Payer: Self-pay | Admitting: Internal Medicine

## 2022-12-26 DIAGNOSIS — I10 Essential (primary) hypertension: Secondary | ICD-10-CM

## 2022-12-26 DIAGNOSIS — E890 Postprocedural hypothyroidism: Secondary | ICD-10-CM

## 2022-12-27 DIAGNOSIS — G5602 Carpal tunnel syndrome, left upper limb: Secondary | ICD-10-CM | POA: Diagnosis not present

## 2022-12-27 DIAGNOSIS — M1712 Unilateral primary osteoarthritis, left knee: Secondary | ICD-10-CM | POA: Diagnosis not present

## 2023-01-08 ENCOUNTER — Other Ambulatory Visit: Payer: Self-pay | Admitting: Internal Medicine

## 2023-01-09 ENCOUNTER — Other Ambulatory Visit: Payer: Self-pay | Admitting: Internal Medicine

## 2023-01-09 DIAGNOSIS — I10 Essential (primary) hypertension: Secondary | ICD-10-CM

## 2023-01-09 NOTE — Telephone Encounter (Signed)
Unable to refill per protocol, Rx request is too soon. Last refill 07/31/22 for 100 days and 1 refill.  Requested Prescriptions  Pending Prescriptions Disp Refills   glipiZIDE (GLUCOTROL) 10 MG tablet [Pharmacy Med Name: glipiZIDE 10 MG Oral Tablet] 200 tablet 2    Sig: TAKE 1 TABLET BY MOUTH TWICE  DAILY BEFORE MEALS     Endocrinology:  Diabetes - Sulfonylureas Failed - 01/08/2023 10:23 PM      Failed - Cr in normal range and within 360 days    Creat  Date Value Ref Range Status  07/19/2016 1.29 (H) 0.50 - 0.99 mg/dL Final    Comment:      For patients > or = 69 years of age: The upper reference limit for Creatinine is approximately 13% higher for people identified as African-American.      Creatinine, Ser  Date Value Ref Range Status  10/09/2022 1.05 (H) 0.57 - 1.00 mg/dL Final   Creatinine, Urine  Date Value Ref Range Status  03/19/2013 129.5 mg/dL Final         Passed - HBA1C is between 0 and 7.9 and within 180 days    HbA1c, POC (controlled diabetic range)  Date Value Ref Range Status  10/02/2022 7.1 (A) 0.0 - 7.0 % Final         Passed - Valid encounter within last 6 months    Recent Outpatient Visits           3 months ago Type 2 diabetes mellitus with morbid obesity (HCC)   [place] [facility] Jonah Blue B, MD   7 months ago Type 2 diabetes mellitus with morbid obesity [facility])   [place] [facility] Marcine Matar, MD   10 months ago Encounter for [employer] annual wellness exam   [facility] [place] L, RPH-CPP   11 months ago Type 2 diabetes mellitus with morbid obesity [facility])   [place] [facility] Marcine Matar, MD   1 year ago Viral laryngitis   [place] [facility] Marcine Matar, MD       Future Appointments             In 3 weeks Marcine [facility]ar, MD  [facility]             JANUVIA 100 MG tablet [Pharmacy Med Name: Januvia 100 MG Oral Tablet] 100 tablet 2    Sig: TAKE 1 TABLET BY MOUTH DAILY     Endocrinology:  Diabetes - DPP-4 Inhibitors Failed - 01/08/2023 10:23 PM      Failed - Cr in normal range and within 360 days    Creat  Date Value Ref Range Status  07/19/2016 1.29 (H) 0.50 - 0.99 mg/dL Final    Comment:      For patients > or = 69 years of age: The upper reference limit for Creatinine is approximately 13% higher for people identified as African-American.      Creatinine, Ser  Date Value Ref Range Status  10/09/2022 1.05 (H) 0.57 - 1.00 mg/dL Final   Creatinine, Urine  Date Value Ref Range Status  03/19/2013 129.5 mg/dL Final         Passed - HBA1C is between 0 and 7.9 and within 180 days    HbA1c, POC (controlled diabetic range)  Date Value Ref Range Status  10/02/2022  7.1 (A) 0.0 - 7.0 % Final         Passed - Valid encounter within last 6 months    Recent Outpatient Visits           3 months ago Type 2 diabetes mellitus with morbid obesity (HCC)   [place] Jonah Blue B, MD   7 months ago Type 2 diabetes mellitus with morbid obesity [facility])   [place] [facility] Marcine Matar, MD   10 months ago Encounter for [employer] annual wellness exam   [facility] [place], Cornelius Moras, RPH-CPP   11 months ago Type 2 diabetes mellitus with morbid obesity [facility])   [place] [facility] Marcine Matar, MD   1 year ago Viral laryngitis   [place] [facility] Marcine Matar, MD       Future Appointments             In 3 weeks Marcine Matar, MD [facility]

## 2023-01-09 NOTE — Telephone Encounter (Signed)
Requested Prescriptions  Pending Prescriptions Disp Refills   lisinopril (ZESTRIL) 20 MG tablet [Pharmacy Med Name: Lisinopril 20 MG Oral Tablet] 100 tablet 2    Sig: TAKE 1 TABLET BY MOUTH DAILY     Cardiovascular:  ACE Inhibitors Failed - 01/09/2023  5:36 AM      Failed - Cr in normal range and within 180 days    Creat  Date Value Ref Range Status  07/19/2016 1.29 (H) 0.50 - 0.99 mg/dL Final    Comment:      For patients > or = 69 years of age: The upper reference limit for Creatinine is approximately 13% higher for people identified as African-American.      Creatinine, Ser  Date Value Ref Range Status  10/09/2022 1.05 (H) 0.57 - 1.00 mg/dL Final   Creatinine, Urine  Date Value Ref Range Status  03/19/2013 129.5 mg/dL Final         Failed - Last BP in normal range    BP Readings from Last 1 Encounters:  10/02/22 (!) 140/70         Passed - K in normal range and within 180 days    Potassium  Date Value Ref Range Status  10/09/2022 4.4 3.5 - 5.2 mmol/L Final         Passed - Patient is not pregnant      Passed - Valid encounter within last 6 months    Recent Outpatient Visits           3 months ago Type 2 diabetes mellitus with morbid obesity (HCC)   [facility] Jonah Blue B, MD   7 months ago Type 2 diabetes mellitus with morbid obesity [facility])   [place] [facility] Marcine Matar, MD   10 months ago Encounter for [employer] annual wellness exam   [facility] [place] L, RPH-CPP   11 months ago Type 2 diabetes mellitus with morbid obesity [facility])   [place] [facility] Marcine Matar, MD   1 year ago Viral laryngitis   [place] [facility] Marcine Matar, MD       Future Appointments             In 3 weeks Marcine Matar, MD [facility]

## 2023-02-02 ENCOUNTER — Ambulatory Visit: Payer: 59 | Attending: Internal Medicine | Admitting: Internal Medicine

## 2023-02-02 ENCOUNTER — Encounter: Payer: Self-pay | Admitting: Internal Medicine

## 2023-02-02 DIAGNOSIS — N1831 Chronic kidney disease, stage 3a: Secondary | ICD-10-CM

## 2023-02-02 DIAGNOSIS — Z7984 Long term (current) use of oral hypoglycemic drugs: Secondary | ICD-10-CM | POA: Diagnosis not present

## 2023-02-02 DIAGNOSIS — E890 Postprocedural hypothyroidism: Secondary | ICD-10-CM | POA: Diagnosis not present

## 2023-02-02 DIAGNOSIS — I10 Essential (primary) hypertension: Secondary | ICD-10-CM | POA: Diagnosis not present

## 2023-02-02 DIAGNOSIS — E785 Hyperlipidemia, unspecified: Secondary | ICD-10-CM | POA: Diagnosis not present

## 2023-02-02 DIAGNOSIS — E1169 Type 2 diabetes mellitus with other specified complication: Secondary | ICD-10-CM

## 2023-02-02 LAB — POCT GLYCOSYLATED HEMOGLOBIN (HGB A1C): HbA1c, POC (controlled diabetic range): 7.2 % — AB (ref 0.0–7.0)

## 2023-02-02 LAB — GLUCOSE, POCT (MANUAL RESULT ENTRY): POC Glucose: 136 mg/dl — AB (ref 70–99)

## 2023-02-02 MED ORDER — SITAGLIPTIN PHOSPHATE 100 MG PO TABS
100.0000 mg | ORAL_TABLET | Freq: Every day | ORAL | 1 refills | Status: DC
Start: 2023-02-02 — End: 2023-06-28

## 2023-02-02 MED ORDER — LEVOTHYROXINE SODIUM 100 MCG PO TABS
100.0000 ug | ORAL_TABLET | Freq: Every day | ORAL | 0 refills | Status: DC
Start: 2023-02-02 — End: 2023-06-07

## 2023-02-02 MED ORDER — AMLODIPINE BESYLATE 10 MG PO TABS: 10.0000 mg | ORAL_TABLET | Freq: Every day | ORAL | 1 refills | Status: DC

## 2023-02-02 NOTE — Progress Notes (Signed)
Patient ID: Kayla Bean, female    DOB: 09-Mar-1956  MRN: [id number]  CC: Diabetes Management Plan (DM f/u. Med refill./Dark spots on kuckles of L & R hand X3 mo/Icthy back X3 mo)   Subjective: Kayla Bean is a 67 y.o. female who presents for chronic ds management Her concerns today include:  Patient with history of diabetes, hypertension, HL, post thyroidectomy hypothyroidism (1973, noncancerous), asthma, CKD stage III, OA knee and obesity, glaucoma, mild intermittent asthma.   DM/obesity: Results for orders placed or performed in visit on 02/02/23  POCT glucose (manual entry)  Result Value Ref Range   POC Glucose 136 (A) 70 - 99 mg/dl  POCT glycosylated hemoglobin (Hb A1C)  Result Value Ref Range   Hemoglobin A1C     HbA1c POC (<> result, manual entry)     HbA1c, POC (prediabetic range)     HbA1c, POC (controlled diabetic range) 7.2 (A) 0.0 - 7.0 %  Checks BS 2x/wk.  Gives range 130s. Compliant with Glucotrol 10 mg BID and Januvia 100 mg Wgh down 5 lbs since last visit.  Eating meanly lean meats especially chicken; but likes [employer], staying away from sugary drinks Was going to exercise group at church 3x/wk.  It aggravated her LT knee so she had to stop -has appt with Dr. Dione Booze in June for DM eye  HTN:  Compliant with taking Norvasc 10 mg daily and lisinopril 20 mg daily for blood pressure.  Limits salt No CP/SOB Checks BP a few times a wk. This a.m was 126/64  HL:  Compliant with Pravachol for the cholesterol. Taking and tolerating the medication.   CKD 3; GFR has remained in the 50s.  Last GFR in January was 58.    Post-ablative hypothyroid: Reports compliance with taking her levothyroxine.  TSH done in January was within normal range.  C/o Itching on back since February mainly at nights.   Patient Active Problem List   Diagnosis Date Noted   Controlled type 2 diabetes mellitus without complication, without long-term current use of insulin (HCC)  07/08/2020   Class 3 severe obesity with serious comorbidity and body mass index (BMI) of 45.0 to 49.9 in adult (HCC) 10/01/2018   Moderate persistent asthma without complication 07/30/2017   Hyperlipidemia 03/22/2017   Cholelithiasis 12/24/2012   LIPOMA 07/26/2010   CONSTIPATION, CHRONIC 01/18/2009   EUSTACHIAN TUBE DYSFUNCTION, RIGHT 10/16/2008   LOM 09/01/2008   Essential hypertension 08/18/2008   Osteoarthritis of left knee 04/23/2008   Hypothyroidism 10/15/2007     Current Outpatient Medications on File Prior to Visit  Medication Sig Dispense Refill   albuterol (VENTOLIN HFA) 108 (90 Base) MCG/ACT inhaler Inhale 2 puffs into the lungs every 6 (six) hours as needed for wheezing or shortness of breath. 18 g 2   aspirin 81 MG tablet Take 1 tablet (81 mg total) daily by mouth. 90 tablet 0   Blood Glucose Monitoring Suppl (ACCU-CHEK GUIDE ME) w/Device KIT 1 Device by Does not apply route daily. 1 kit 0   brimonidine-timolol (COMBIGAN) 0.2-0.5 % ophthalmic solution Place 1 drop into both eyes every 12 (twelve) hours.     celecoxib (CELEBREX) 200 MG capsule Take 200 mg by mouth daily.     cycloSPORINE (RESTASIS) 0.05 % ophthalmic emulsion Place 1 drop into both eyes 2 (two) times daily.     glipiZIDE (GLUCOTROL) 10 MG tablet TAKE 1 TABLET BY MOUTH TWICE  DAILY BEFORE MEALS 200 tablet 1   glucose blood (ACCU-CHEK GUIDE)  test strip Use as instructed 100 each 12   Lancet Devices (ACCU-CHEK SOFTCLIX) lancets Use as instructed for 3 times daily testing of blood glucose 1 each 0   Lancets Misc. (ACCU-CHEK FASTCLIX LANCET) KIT Use as directed 1 kit 12   latanoprost (XALATAN) 0.005 % ophthalmic solution Place 1 drop into both eyes at bedtime.      lisinopril (ZESTRIL) 20 MG tablet TAKE 1 TABLET BY MOUTH DAILY 100 tablet 1   pravastatin (PRAVACHOL) 40 MG tablet TAKE 1 TABLET BY MOUTH DAILY 100 tablet 2   No current facility-administered medications on file prior to visit.    Allergies   Allergen Reactions   Farxiga [Dapagliflozin] Other (See Comments)    Recurrent vaginal yeast   Latex Hives and Other (See Comments)   Metformin     Other reaction(s): hair loss   Metformin And Related     Hair loss   Penicillin G Other (See Comments)   Penicillins Hives   Warfarin Sodium Hives    REACTION: Got rash when took one pill of coumadin  related to knee replacement surgery in 2007.   Warfarin Sodium Other (See Comments)   Iodinated Contrast Media Other (See Comments)    PT STATES SHE HAD AN ALLERGIC REACTION OF BLISTERS ON HANDS AND FEET 2 DAYS AFTER CT SCAN W/ IV CONTRAST INJECTION, DR. MANSELL SUGGESTS 13 HR PREP//A.C.    Social History   Socioeconomic History   Marital status: Single    Spouse name: Not on file   Number of children: Not on file   Years of education: Not on file   Highest education level: Not on file  Occupational History   Not on file  Tobacco Use   Smoking status: Former    Types: Cigarettes    Quit date: 09/18/2001    Years since quitting: 21.3   Smokeless tobacco: Never  Substance and Sexual Activity   Alcohol use: Yes   Drug use: No   Sexual activity: Not Currently  Other Topics Concern   Not on file  Social History Narrative   Not on file   Social Determinants of Health   Financial Resource Strain: Not on file  Food Insecurity: Not on file  Transportation Needs: Not on file  Physical Activity: Not on file  Stress: Not on file  Social Connections: Not on file  Intimate Partner Violence: Not on file    Family History  Problem Relation Age of Onset   Asthma Other    Cancer Other    Hypertension Other    Diabetes Other     Past Surgical History:  Procedure Laterality Date   ABDOMINAL HYSTERECTOMY     CHOLECYSTECTOMY N/A 02/24/2019   Procedure: LAPAROSCOPIC CHOLECYSTECTOMY WITH INTRAOPERATIVE CHOLANGIOGRAM;  Surgeon: Manus Rudd, MD;  Location: [facility] OR;  Service: General;  Laterality: N/A;   KNEE SURGERY     left    THYROIDECTOMY     TOTAL KNEE ARTHROPLASTY     right knee    ROS: Review of Systems Negative except as stated above  PHYSICAL EXAM: BP 132/68   Pulse 72   Temp 98.4 F (36.9 C) (Oral)   Ht 5\' 2"  (1.575 m)   Wt 239 lb (108.4 kg)   SpO2 98%   BMI 43.71 kg/m   Wt Readings from Last 3 Encounters:  02/02/23 239 lb (108.4 kg)  10/02/22 244 lb (110.7 kg)  06/01/22 246 lb (111.6 kg)    Physical Exam  General appearance -  alert, well appearing, and in no distress Mental status - normal mood, behavior, speech, dress, motor activity, and thought processes Neck - supple, no significant adenopathy Chest - clear to auscultation, no wheezes, rales or rhonchi, symmetric air entry Heart - normal rate, regular rhythm, normal S1, S2, no murmurs, rubs, clicks or gallops Extremities - peripheral pulses normal, no pedal edema, no clubbing or cyanosis Skin: No rash or abnormal skin lesions noted on the posterior thorax. Diabetic Foot Exam - Simple   Simple Foot Form Diabetic Foot exam was performed with the following findings: Yes 02/02/2023  6:33 PM  Visual Inspection See comments: Yes Sensation Testing Intact to touch and monofilament testing bilaterally: Yes Pulse Check Posterior Tibialis and Dorsalis pulse intact bilaterally: Yes Comments Noninflamed bunion on the right foot.         Latest Ref Rng & Units 10/09/2022   12:09 PM 01/31/2022   11:58 AM 05/12/2021    4:45 PM  CMP  Glucose 70 - 99 mg/dL 161  096  045   BUN 8 - 27 mg/dL 14  11  13    Creatinine 0.57 - 1.00 mg/dL 4.09  8.11  9.14   Sodium 134 - 144 mmol/L 140  141  136   Potassium 3.5 - 5.2 mmol/L 4.4  4.6  4.3   Chloride 96 - 106 mmol/L 101  104  100   CO2 20 - 29 mmol/L 24  23  22    Calcium 8.7 - 10.3 mg/dL 9.3  9.4  9.7   Total Protein 6.0 - 8.5 g/dL  7.6  8.3   Total Bilirubin 0.0 - 1.2 mg/dL  0.3  0.4   Alkaline Phos 44 - 121 IU/L  121  122   AST 0 - 40 IU/L  14  19   ALT 0 - 32 IU/L  13  16    Lipid Panel      Component Value Date/Time   CHOL 124 01/31/2022 1158   TRIG 54 01/31/2022 1158   HDL 44 01/31/2022 1158   CHOLHDL 2.8 01/31/2022 1158   CHOLHDL 3.6 03/14/2016 1238   VLDL 12 03/14/2016 1238   LDLCALC 68 01/31/2022 1158    CBC    Component Value Date/Time   WBC 9.5 01/31/2022 1158   WBC 9.5 10/22/2019 1113   RBC 4.25 01/31/2022 1158   RBC 4.25 10/22/2019 1113   HGB 13.2 01/31/2022 1158   HCT 38.4 01/31/2022 1158   PLT 273 01/31/2022 1158   MCV 90 01/31/2022 1158   MCH 31.1 01/31/2022 1158   MCH 30.8 10/22/2019 1113   MCHC 34.4 01/31/2022 1158   MCHC 33.5 10/22/2019 1113   RDW 12.8 01/31/2022 1158   LYMPHSABS 1.8 10/22/2019 1113   MONOABS 0.4 10/22/2019 1113   EOSABS 0.4 10/22/2019 1113   BASOSABS 0.1 10/22/2019 1113    ASSESSMENT AND PLAN:  1. Type 2 diabetes mellitus with morbid obesity (HCC) A1c slightly above goal. Patient will work on improving her eating habits more.  She will look into water aerobics.  She will continue glipizide 10 mg twice a day and Januvia 100 mg daily. - POCT glucose (manual entry) - POCT glycosylated hemoglobin (Hb A1C) - Microalbumin / creatinine urine ratio - Hepatic Function Panel - sitaGLIPtin (JANUVIA) 100 MG tablet; Take 1 tablet (100 mg total) by mouth daily.  Dispense: 100 tablet; Refill: 1  2. Essential hypertension Close to goal.  Continue Norvasc and lisinopril. - amLODipine (NORVASC) 10 MG tablet; Take 1 tablet (  10 mg total) by mouth daily.  Dispense: 100 tablet; Refill: 1  3. Postoperative hypothyroidism Last TSH at goal.  Continue levothyroxine - levothyroxine (SYNTHROID) 100 MCG tablet; Take 1 tablet (100 mcg total) by mouth daily before breakfast.  Dispense: 100 tablet; Refill: 0  4. Stage 3a chronic kidney disease (HCC) Stable.  Continue to avoid NSAIDs.  5. Hyperlipidemia associated with type 2 diabetes mellitus (HCC) Continue pravastatin 40 mg daily. - Lipid panel    Patient was given the opportunity to  ask questions.  Patient verbalized understanding of the plan and was able to repeat key elements of the plan.   This documentation was completed using Paediatric nurse.  Any transcriptional errors are unintentional.  Orders Placed This Encounter  Procedures   Lipid panel   Microalbumin / creatinine urine ratio   Hepatic Function Panel   POCT glucose (manual entry)   POCT glycosylated hemoglobin (Hb A1C)     Requested Prescriptions   Signed Prescriptions Disp Refills   amLODipine (NORVASC) 10 MG tablet 100 tablet 1    Sig: Take 1 tablet (10 mg total) by mouth daily.   levothyroxine (SYNTHROID) 100 MCG tablet 100 tablet 0    Sig: Take 1 tablet (100 mcg total) by mouth daily before breakfast.   sitaGLIPtin (JANUVIA) 100 MG tablet 100 tablet 1    Sig: Take 1 tablet (100 mg total) by mouth daily.    Return in about 4 months (around 06/05/2023) for Give [employer] Wellness visit via phone with CMA after 02/24/2023.  Jonah Blue, MD, FACP

## 2023-02-03 LAB — MICROALBUMIN / CREATININE URINE RATIO
Creatinine, Urine: 92.1 mg/dL
Microalb/Creat Ratio: 6 mg/g creat (ref 0–29)
Microalbumin, Urine: 5.4 ug/mL

## 2023-02-03 LAB — HEPATIC FUNCTION PANEL
ALT: 11 IU/L (ref 0–32)
AST: 14 IU/L (ref 0–40)
Albumin: 4.3 g/dL (ref 3.9–4.9)
Alkaline Phosphatase: 131 IU/L — ABNORMAL HIGH (ref 44–121)
Bilirubin Total: 0.4 mg/dL (ref 0.0–1.2)
Bilirubin, Direct: 0.12 mg/dL (ref 0.00–0.40)
Total Protein: 7.9 g/dL (ref 6.0–8.5)

## 2023-02-03 LAB — LIPID PANEL
Chol/HDL Ratio: 3.1 ratio (ref 0.0–4.4)
Cholesterol, Total: 127 mg/dL (ref 100–199)
HDL: 41 mg/dL (ref >39)
LDL Chol Calc (NIH): 64 mg/dL (ref 0–99)
Triglycerides: 122 mg/dL (ref 0–149)
VLDL Cholesterol Cal: 22 mg/dL (ref 5–40)

## 2023-02-27 ENCOUNTER — Ambulatory Visit: Payer: 59 | Attending: Internal Medicine

## 2023-02-27 VITALS — Ht 62.0 in | Wt 239.0 lb

## 2023-02-27 DIAGNOSIS — Z0000 Encounter for general adult medical examination without abnormal findings: Secondary | ICD-10-CM | POA: Diagnosis not present

## 2023-02-27 NOTE — Patient Instructions (Addendum)
Kayla Bean , Thank you for taking time to come for your Medicare Wellness Visit. I appreciate your ongoing commitment to your health goals. Please review the following plan we discussed and let me know if I can assist you in the future.   These are the goals we discussed:  Goals      Remain active and independent        This is a list of the screening recommended for you and due dates:  Health Maintenance  Topic Date Due   Eye exam for diabetics  04/28/2022   COVID-19 Vaccine (4 - 2023-24 season) 05/19/2022   Flu Shot  04/19/2023   Hemoglobin A1C  08/05/2023   Yearly kidney function blood test for diabetes  10/10/2023   Yearly kidney health urinalysis for diabetes  02/02/2024   Complete foot exam   02/02/2024   Medicare Annual Wellness Visit  02/27/2024   Mammogram  09/29/2024   Colon Cancer Screening  06/03/2030   DTaP/Tdap/Td vaccine (4 - Td or Tdap) 08/22/2030   Pneumonia Vaccine  Completed   DEXA scan (bone density measurement)  Completed   Hepatitis C Screening  Completed   Zoster (Shingles) Vaccine  Completed   HPV Vaccine  Aged Out    Advanced directives: Information on Advanced Care Planning can be found at [facility] Advance Health Care Directives Advance Health Care Directives ([email or web address] Please bring a copy of your health care power of attorney and living will to the office to be added to your chart at your convenience.   Conditions/risks identified: Aim for 30 minutes of exercise or brisk walking, 6-8 glasses of water, and 5 servings of fruits and vegetables each day.   Next appointment: Follow up in one year for your annual wellness visit    Preventive Care 65 Years and Older, Female Preventive care refers to lifestyle choices and visits with your health care provider that can promote health and wellness. What does preventive care include? A yearly physical exam. This is also called an annual well check. Dental exams once or twice a  year. Routine eye exams. Ask your health care provider how often you should have your eyes checked. Personal lifestyle choices, including: Daily care of your teeth and gums. Regular physical activity. Eating a healthy diet. Avoiding tobacco and drug use. Limiting alcohol use. Practicing safe sex. Taking low-dose aspirin every day. Taking vitamin and mineral supplements as recommended by your health care provider. What happens during an annual well check? The services and screenings done by your health care provider during your annual well check will depend on your age, overall health, lifestyle risk factors, and family history of disease. Counseling  Your health care provider may ask you questions about your: Alcohol use. Tobacco use. Drug use. Emotional well-being. Home and relationship well-being. Sexual activity. Eating habits. History of falls. Memory and ability to understand (cognition). Work and work Astronomer. Reproductive health. Screening  You may have the following tests or measurements: Height, weight, and BMI. Blood pressure. Lipid and cholesterol levels. These may be checked every 5 years, or more frequently if you are over 23 years old. Skin check. Lung cancer screening. You may have this screening every year starting at age 60 if you have a 30-pack-year history of smoking and currently smoke or have quit within the past 15 years. Fecal occult blood test (FOBT) of the stool. You may have this test every year starting at age 71. Flexible sigmoidoscopy or colonoscopy. You may  have a sigmoidoscopy every 5 years or a colonoscopy every 10 years starting at age 49. Hepatitis C blood test. Hepatitis B blood test. Sexually transmitted disease (STD) testing. Diabetes screening. This is done by checking your blood sugar (glucose) after you have not eaten for a while (fasting). You may have this done every 1-3 years. Bone density scan. This is done to screen for  osteoporosis. You may have this done starting at age 22. Mammogram. This may be done every 1-2 years. Talk to your health care provider about how often you should have regular mammograms. Talk with your health care provider about your test results, treatment options, and if necessary, the need for more tests. Vaccines  Your health care provider may recommend certain vaccines, such as: Influenza vaccine. This is recommended every year. Tetanus, diphtheria, and acellular pertussis (Tdap, Td) vaccine. You may need a Td booster every 10 years. Zoster vaccine. You may need this after age 11. Pneumococcal 13-valent conjugate (PCV13) vaccine. One dose is recommended after age 20. Pneumococcal polysaccharide (PPSV23) vaccine. One dose is recommended after age 47. Talk to your health care provider about which screenings and vaccines you need and how often you need them. This information is not intended to replace advice given to you by your health care provider. Make sure you discuss any questions you have with your health care provider. Document Released: 10/01/2015 Document Revised: 05/24/2016 Document Reviewed: 07/06/2015 Elsevier Interactive Patient Education  2017 [place] Prevention in the Home Falls can cause injuries. They can happen to people of all ages. There are many things you can do to make your home safe and to help prevent falls. What can I do on the outside of my home? Regularly fix the edges of walkways and driveways and fix any cracks. Remove anything that might make you trip as you walk through a door, such as a raised step or threshold. Trim any bushes or trees on the path to your home. Use bright outdoor lighting. Clear any walking paths of anything that might make someone trip, such as rocks or tools. Regularly check to see if handrails are loose or broken. Make sure that both sides of any steps have handrails. Any raised decks and porches should have guardrails on  the edges. Have any leaves, snow, or ice cleared regularly. Use sand or salt on walking paths during winter. Clean up any spills in your garage right away. This includes oil or grease spills. What can I do in the bathroom? Use night lights. Install grab bars by the toilet and in the tub and shower. Do not use towel bars as grab bars. Use non-skid mats or decals in the tub or shower. If you need to sit down in the shower, use a plastic, non-slip stool. Keep the floor dry. Clean up any water that spills on the floor as soon as it happens. Remove soap buildup in the tub or shower regularly. Attach bath mats securely with double-sided non-slip rug tape. Do not have throw rugs and other things on the floor that can make you trip. What can I do in the bedroom? Use night lights. Make sure that you have a light by your bed that is easy to reach. Do not use any sheets or blankets that are too big for your bed. They should not hang down onto the floor. Have a firm chair that has side arms. You can use this for support while you get dressed. Do not have throw rugs  and other things on the floor that can make you trip. What can I do in the kitchen? Clean up any spills right away. Avoid walking on wet floors. Keep items that you use a lot in easy-to-reach places. If you need to reach something above you, use a strong step stool that has a grab bar. Keep electrical cords out of the way. Do not use floor polish or wax that makes floors slippery. If you must use wax, use non-skid floor wax. Do not have throw rugs and other things on the floor that can make you trip. What can I do with my stairs? Do not leave any items on the stairs. Make sure that there are handrails on both sides of the stairs and use them. Fix handrails that are broken or loose. Make sure that handrails are as long as the stairways. Check any carpeting to make sure that it is firmly attached to the stairs. Fix any carpet that is loose  or worn. Avoid having throw rugs at the top or bottom of the stairs. If you do have throw rugs, attach them to the floor with carpet tape. Make sure that you have a light switch at the top of the stairs and the bottom of the stairs. If you do not have them, ask someone to add them for you. What else can I do to help prevent falls? Wear shoes that: Do not have high heels. Have rubber bottoms. Are comfortable and fit you well. Are closed at the toe. Do not wear sandals. If you use a stepladder: Make sure that it is fully opened. Do not climb a closed stepladder. Make sure that both sides of the stepladder are locked into place. Ask someone to hold it for you, if possible. Clearly mark and make sure that you can see: Any grab bars or handrails. First and last steps. Where the edge of each step is. Use tools that help you move around (mobility aids) if they are needed. These include: Canes. Walkers. Scooters. Crutches. Turn on the lights when you go into a dark area. Replace any light bulbs as soon as they burn out. Set up your furniture so you have a clear path. Avoid moving your furniture around. If any of your floors are uneven, fix them. If there are any pets around you, be aware of where they are. Review your medicines with your doctor. Some medicines can make you feel dizzy. This can increase your chance of falling. Ask your doctor what other things that you can do to help prevent falls. This information is not intended to replace advice given to you by your health care provider. Make sure you discuss any questions you have with your health care provider. Document Released: 07/01/2009 Document Revised: 02/10/2016 Document Reviewed: 10/09/2014 Elsevier Interactive Patient Education  2017 [employer].

## 2023-02-27 NOTE — Progress Notes (Signed)
Subjective:   Kayla Bean is a 67 y.o. female who presents for Medicare Annual (Subsequent) preventive examination.  I connected with  Kayla Bean on 02/27/23 by a audio enabled telemedicine application and verified that I am speaking with the correct person using two identifiers.  Patient Location: Home  Provider Location: Home Office  I discussed the limitations of evaluation and management by telemedicine. The patient expressed understanding and agreed to proceed.  Review of Systems     Cardiac Risk Factors include: advanced age (>19men, >7 women);diabetes mellitus;dyslipidemia;hypertension     Objective:    Today's Vitals   02/27/23 1446  Weight: 239 lb (108.4 kg)  Height: 5\' 2"  (1.575 m)   Body mass index is 43.71 kg/m.     02/27/2023    2:53 PM 02/23/2022    4:05 PM 02/22/2021    2:33 PM 10/22/2019   10:25 AM 02/25/2019    3:41 AM 07/03/2018    3:26 PM 09/09/2017    9:46 PM  Advanced Directives  Does Patient Have a Medical Advance Directive? No No No No No Yes No  Does patient want to make changes to medical advance directive?      No - Patient declined   Would patient like information on creating a medical advance directive? Yes (MAU/Ambulatory/Procedural Areas - Information given) No - Patient declined No - Patient declined No - Patient declined No - Guardian declined      Current Medications (verified) Outpatient Encounter Medications as of 02/27/2023  Medication Sig   albuterol (VENTOLIN HFA) 108 (90 Base) MCG/ACT inhaler Inhale 2 puffs into the lungs every 6 (six) hours as needed for wheezing or shortness of breath.   amLODipine (NORVASC) 10 MG tablet Take 1 tablet (10 mg total) by mouth daily.   aspirin 81 MG tablet Take 1 tablet (81 mg total) daily by mouth.   Blood Glucose Monitoring Suppl (ACCU-CHEK GUIDE ME) w/Device KIT 1 Device by Does not apply route daily.   brimonidine-timolol (COMBIGAN) 0.2-0.5 % ophthalmic solution Place 1 drop  into both eyes every 12 (twelve) hours.   celecoxib (CELEBREX) 200 MG capsule Take 200 mg by mouth daily.   cycloSPORINE (RESTASIS) 0.05 % ophthalmic emulsion Place 1 drop into both eyes 2 (two) times daily.   glipiZIDE (GLUCOTROL) 10 MG tablet TAKE 1 TABLET BY MOUTH TWICE  DAILY BEFORE MEALS   glucose blood (ACCU-CHEK GUIDE) test strip Use as instructed   Lancet Devices (ACCU-CHEK SOFTCLIX) lancets Use as instructed for 3 times daily testing of blood glucose   Lancets Misc. (ACCU-CHEK FASTCLIX LANCET) KIT Use as directed   latanoprost (XALATAN) 0.005 % ophthalmic solution Place 1 drop into both eyes at bedtime.    levothyroxine (SYNTHROID) 100 MCG tablet Take 1 tablet (100 mcg total) by mouth daily before breakfast.   lisinopril (ZESTRIL) 20 MG tablet TAKE 1 TABLET BY MOUTH DAILY   pravastatin (PRAVACHOL) 40 MG tablet TAKE 1 TABLET BY MOUTH DAILY   sitaGLIPtin (JANUVIA) 100 MG tablet Take 1 tablet (100 mg total) by mouth daily.   No facility-administered encounter medications on file as of 02/27/2023.    Allergies (verified) Farxiga [dapagliflozin], Latex, Metformin, Metformin and related, Penicillin g, Penicillins, Warfarin sodium, Warfarin sodium, and Iodinated contrast media   History: Past Medical History:  Diagnosis Date   Arthritis    Asthma    Diabetes mellitus    Heart murmur    Hemorrhoids    Hypertension    POAG (primary open-angle glaucoma)  BL - followed by Dr. Lavona Mound   Thyroid disease    hypothyroidism   Past Surgical History:  Procedure Laterality Date   ABDOMINAL HYSTERECTOMY     CHOLECYSTECTOMY N/A 02/24/2019   Procedure: LAPAROSCOPIC CHOLECYSTECTOMY WITH INTRAOPERATIVE CHOLANGIOGRAM;  Surgeon: Manus Rudd, MD;  Location: [facility] OR;  Service: General;  Laterality: N/A;   KNEE SURGERY     left   THYROIDECTOMY     TOTAL KNEE ARTHROPLASTY     right knee   Family History  Problem Relation Age of Onset   Asthma Other    Cancer Other    Hypertension Other     Diabetes Other    Social History   Socioeconomic History   Marital status: Single    Spouse name: Not on file   Number of children: Not on file   Years of education: Not on file   Highest education level: Not on file  Occupational History   Not on file  Tobacco Use   Smoking status: Former    Types: Cigarettes    Quit date: 09/18/2001    Years since quitting: 21.4   Smokeless tobacco: Never  Substance and Sexual Activity   Alcohol use: Yes   Drug use: No   Sexual activity: Not Currently  Other Topics Concern   Not on file  Social History Narrative   Not on file   Social Determinants of Health   Financial Resource Strain: Low Risk  (02/27/2023)   Overall Financial Resource Strain (CARDIA)    Difficulty of Paying Living Expenses: Not hard at all  Food Insecurity: No Food Insecurity (02/27/2023)   Hunger Vital Sign    Worried About Running Out of Food in the Last Year: Never true    Ran Out of Food in the Last Year: Never true  Transportation Needs: No Transportation Needs (02/27/2023)   PRAPARE - Administrator, Civil Service (Medical): No    Lack of Transportation (Non-Medical): No  Physical Activity: Inactive (02/27/2023)   Exercise Vital Sign    Days of Exercise per Week: 0 days    Minutes of Exercise per Session: 0 min  Stress: No Stress Concern Present (02/27/2023)   [employer] of Occupational Health - Occupational Stress Questionnaire    Feeling of Stress : Not at all  Social Connections: Moderately Isolated (02/27/2023)   Social Connection and Isolation Panel [NHANES]    Frequency of Communication with Friends and Family: More than three times a week    Frequency of Social Gatherings with Friends and Family: Three times a week    Attends Religious Services: More than 4 times per year    Active Member of Clubs or Organizations: No    Attends Banker Meetings: Never    Marital Status: Never married    Tobacco Counseling Counseling  given: Not Answered   Clinical Intake:  Pre-visit preparation completed: Yes  Pain : No/denies pain     Diabetes: Yes CBG done?: No Did pt. bring in CBG monitor from home?: No  How often do you need to have someone help you when you read instructions, pamphlets, or other written materials from your doctor or pharmacy?: 1 - Never  Diabetic?Yes   Nutrition Risk Assessment:  Has the patient had any N/V/D within the last 2 months?  No  Does the patient have any non-healing wounds?  No  Has the patient had any unintentional weight loss or weight gain?  No   Diabetes:  Is the  patient diabetic?  Yes  If diabetic, was a CBG obtained today?  No  Did the patient bring in their glucometer from home?  No  How often do you monitor your CBG's? daily.   Financial Strains and Diabetes Management:  Are you having any financial strains with the device, your supplies or your medication? No .  Does the patient want to be seen by Chronic Care Management for management of their diabetes?  No  Would the patient like to be referred to a Nutritionist or for Diabetic Management?  No   Diabetic Exams:  Diabetic Eye Exam: Completed records requested  Diabetic Foot Exam: Completed 02/02/23   Interpreter Needed?: No  Information entered by :: Kandis Fantasia LPN   Activities of Daily Living    02/27/2023    2:49 PM 02/24/2023    6:29 PM  In your present state of health, do you have any difficulty performing the following activities:  Hearing? 0 0  Vision? 0 0  Difficulty concentrating or making decisions? 0 0  Walking or climbing stairs? 0 1  Dressing or bathing? 0 0  Doing errands, shopping? 0 0  Preparing Food and eating ? N N  Using the Toilet? N N  In the past six months, have you accidently leaked urine? N N  Do you have problems with loss of bowel control? N N  Managing your Medications? N N  Managing your Finances? N N  Housekeeping or managing your Housekeeping? N N     Patient Care Team: Marcine Matar, MD as PCP - General (Internal Medicine) [facility] Elodia Florence, PA-C (Orthopedic Surgery)  Indicate any recent Medical Services you may have received from other than [facility] providers in the past year (date may be approximate).     Assessment:   This is a routine wellness examination for Dezeree.  Hearing/Vision screen Hearing Screening - Comments:: Denies hearing difficulties   Vision Screening - Comments:: Wears rx glasses - up to date with routine eye exams with [facility]    Dietary issues and exercise activities discussed: Current Exercise Habits: The patient does not participate in regular exercise at present   Goals Addressed             This Visit's Progress    Remain active and independent         Depression Screen    02/27/2023    2:48 PM 10/02/2022    4:17 PM 02/23/2022    4:07 PM 05/12/2021    4:23 PM 02/22/2021    2:32 PM 08/05/2020   10:25 AM 07/08/2020    3:11 PM  PHQ 2/9 Scores  PHQ - 2 Score 0 0 0 0 0 0 0  PHQ- 9 Score       0    Fall Risk    02/27/2023    2:44 PM 02/24/2023    6:29 PM 02/02/2023    4:10 PM 10/02/2022    4:17 PM 06/01/2022    4:21 PM  Fall Risk   Falls in the past year? 0 0 0 0 0  Number falls in past yr: 0  0 0 0  Injury with Fall? 0 0 0 0 0  Risk for fall due to : No Fall Risks  No Fall Risks No Fall Risks No Fall Risks  Follow up Falls prevention discussed;Education provided;Falls evaluation completed    Falls evaluation completed    FALL RISK PREVENTION PERTAINING TO THE HOME:  Any  stairs in or around the home? No  If so, are there any without handrails? No  Home free of loose throw rugs in walkways, pet beds, electrical cords, etc? Yes  Adequate lighting in your home to reduce risk of falls? Yes   ASSISTIVE DEVICES UTILIZED TO PREVENT FALLS:  Life alert? No  Use of a cane, walker or w/c? No  Grab bars in the bathroom? Yes  Shower chair or bench in  shower? No  Elevated toilet seat or a handicapped toilet? Yes   TIMED UP AND GO:  Was the test performed? No . Telephonic visit   Cognitive Function:    02/23/2022    4:09 PM 02/22/2021    2:33 PM  MMSE - Mini Mental State Exam  Orientation to time 5 5  Orientation to Place 5 5  Registration 3 3  Attention/ Calculation 5 5  Recall 2 2  Language- name 2 objects 2 2  Language- repeat 1 1  Language- follow 3 step command 3 3  Language- read & follow direction 1 1  Write a sentence 1 1  Copy design 1 0  Total score 29 28        02/27/2023    2:53 PM  6CIT Screen  What Year? 0 points  What month? 0 points  What time? 0 points  Count back from 20 0 points  Months in reverse 0 points  Repeat phrase 0 points  Total Score 0 points    Immunizations Immunization History  Administered Date(s) Administered   H1N1 08/18/2008   Influenza Whole 07/31/2007, 08/18/2008, 08/10/2009, 07/26/2010   Influenza, Quadrivalent, Recombinant, Inj, Pf 05/23/2019   Influenza,inj,Quad PF,6+ Mos 06/17/2015, 07/19/2016, 05/14/2018, 05/19/2020, 05/12/2021   Influenza-Unspecified 06/18/2022   PFIZER(Purple Top)SARS-COV-2 Vaccination 11/29/2019, 01/07/2020, 07/22/2020   Pneumococcal Conjugate-13 05/10/2017   Pneumococcal Polysaccharide-23 01/18/2009, 11/12/2019   Td 01/18/2009   Tdap 04/24/2019, 08/22/2020   Zoster Recombinat (Shingrix) 02/22/2021, 11/29/2021    TDAP status: Up to date  Pneumococcal vaccine status: Up to date  Covid-19 vaccine status: Information provided on how to obtain vaccines.   Qualifies for Shingles Vaccine? Yes   Zostavax completed No   Shingrix Completed?: Yes  Screening Tests Health Maintenance  Topic Date Due   OPHTHALMOLOGY EXAM  04/28/2022   COVID-19 Vaccine (4 - 2023-24 season) 05/19/2022   INFLUENZA VACCINE  04/19/2023   HEMOGLOBIN A1C  08/05/2023   Diabetic kidney evaluation - eGFR measurement  10/10/2023   Diabetic kidney evaluation - Urine ACR   02/02/2024   FOOT EXAM  02/02/2024   Medicare Annual Wellness (AWV)  02/27/2024   MAMMOGRAM  09/29/2024   Colonoscopy  06/03/2030   DTaP/Tdap/Td (4 - Td or Tdap) 08/22/2030   Pneumonia Vaccine 11+ Years old  Completed   DEXA SCAN  Completed   Hepatitis C Screening  Completed   Zoster Vaccines- Shingrix  Completed   HPV VACCINES  Aged Out    Health Maintenance  Health Maintenance Due  Topic Date Due   OPHTHALMOLOGY EXAM  04/28/2022   COVID-19 Vaccine (4 - 2023-24 season) 05/19/2022    Colorectal cancer screening: Type of screening: Colonoscopy. Completed 06/03/20. Repeat every 10 years  Mammogram status: Completed 09/29/22. Repeat every year  Bone Density status: Completed 08/25/21. Results reflect: Bone density results: NORMAL. Repeat every 5 years.  Lung Cancer Screening: (Low Dose CT Chest recommended if Age 23-80 years, 30 pack-year currently smoking OR have quit w/in 15years.) does not qualify.   Lung Cancer Screening Referral:  n/a  Additional Screening:  Hepatitis C Screening: does qualify; Completed 03/14/16  Vision Screening: Recommended annual ophthalmology exams for early detection of glaucoma and other disorders of the eye. Is the patient up to date with their annual eye exam?  Yes  Who is the provider or what is the name of the office in which the patient attends annual eye exams? [facility]  If pt is not established with a provider, would they like to be referred to a provider to establish care? No .   Dental Screening: Recommended annual dental exams for proper oral hygiene  Community Resource Referral / Chronic Care Management: CRR required this visit?  No   CCM required this visit?  No      Plan:     I have personally reviewed and noted the following in the patient's chart:   Medical and social history Use of alcohol, tobacco or illicit drugs  Current medications and supplements including opioid prescriptions. Patient is not currently taking  opioid prescriptions. Functional ability and status Nutritional status Physical activity Advanced directives List of other physicians Hospitalizations, surgeries, and ER visits in previous 12 months Vitals Screenings to include cognitive, depression, and falls Referrals and appointments  In addition, I have reviewed and discussed with patient certain preventive protocols, quality metrics, and best practice recommendations. A written personalized care plan for preventive services as well as general preventive health recommendations were provided to patient.     Durwin Nora, [place]   11/01/863   Due to this being a virtual visit, the after visit summary with patients personalized plan was offered to patient via mail or my-chart. Patient declined at this time./ Patient would like to access on my-chart/ per request, patient was mailed a copy of AVS./ Patient preferred to pick up at office at next visit  Nurse Notes: No concerns; Patient received notice that she may have a medication that is requiring a PA, she is going to check and contact the office if needed.

## 2023-04-01 ENCOUNTER — Emergency Department (HOSPITAL_COMMUNITY): Payer: 59

## 2023-04-01 ENCOUNTER — Emergency Department (HOSPITAL_COMMUNITY)
Admission: EM | Admit: 2023-04-01 | Discharge: 2023-04-01 | Disposition: A | Discharge status: Home / Self Care | Payer: 59 | Attending: Emergency Medicine | Admitting: Emergency Medicine

## 2023-04-01 ENCOUNTER — Other Ambulatory Visit: Payer: Self-pay

## 2023-04-01 DIAGNOSIS — R0603 Acute respiratory distress: Secondary | ICD-10-CM | POA: Diagnosis not present

## 2023-04-01 DIAGNOSIS — M19011 Primary osteoarthritis, right shoulder: Secondary | ICD-10-CM | POA: Diagnosis not present

## 2023-04-01 DIAGNOSIS — M4316 Spondylolisthesis, lumbar region: Secondary | ICD-10-CM | POA: Diagnosis not present

## 2023-04-01 DIAGNOSIS — Z043 Encounter for examination and observation following other accident: Secondary | ICD-10-CM | POA: Diagnosis not present

## 2023-04-01 DIAGNOSIS — Z91040 Latex allergy status: Secondary | ICD-10-CM | POA: Insufficient documentation

## 2023-04-01 DIAGNOSIS — Z7982 Long term (current) use of aspirin: Secondary | ICD-10-CM | POA: Insufficient documentation

## 2023-04-01 DIAGNOSIS — S4981XA Other specified injuries of right shoulder and upper arm, initial encounter: Secondary | ICD-10-CM | POA: Diagnosis not present

## 2023-04-01 DIAGNOSIS — W1839XA Other fall on same level, initial encounter: Secondary | ICD-10-CM | POA: Insufficient documentation

## 2023-04-01 DIAGNOSIS — M25511 Pain in right shoulder: Secondary | ICD-10-CM | POA: Insufficient documentation

## 2023-04-01 DIAGNOSIS — J9811 Atelectasis: Secondary | ICD-10-CM | POA: Diagnosis not present

## 2023-04-01 DIAGNOSIS — W19XXXA Unspecified fall, initial encounter: Secondary | ICD-10-CM

## 2023-04-01 DIAGNOSIS — R0781 Pleurodynia: Secondary | ICD-10-CM | POA: Diagnosis not present

## 2023-04-01 DIAGNOSIS — M47816 Spondylosis without myelopathy or radiculopathy, lumbar region: Secondary | ICD-10-CM | POA: Diagnosis not present

## 2023-04-01 LAB — CBC WITH DIFFERENTIAL/PLATELET
Abs Immature Granulocytes: 0.09 10*3/uL — ABNORMAL HIGH (ref 0.00–0.07)
Basophils Absolute: 0.1 10*3/uL (ref 0.0–0.1)
Basophils Relative: 1 %
Eosinophils Absolute: 0.4 10*3/uL (ref 0.0–0.5)
Eosinophils Relative: 4 %
HCT: 41.2 % (ref 36.0–46.0)
Hemoglobin: 13.7 g/dL (ref 12.0–15.0)
Immature Granulocytes: 1 %
Lymphocytes Relative: 16 %
Lymphs Abs: 1.4 10*3/uL (ref 0.7–4.0)
MCH: 31.4 pg (ref 26.0–34.0)
MCHC: 33.3 g/dL (ref 30.0–36.0)
MCV: 94.3 fL (ref 80.0–100.0)
Monocytes Absolute: 0.4 10*3/uL (ref 0.1–1.0)
Monocytes Relative: 4 %
Neutro Abs: 6.6 10*3/uL (ref 1.7–7.7)
Neutrophils Relative %: 74 %
Platelets: 211 10*3/uL (ref 150–400)
RBC: 4.37 MIL/uL (ref 3.87–5.11)
RDW: 12.7 % (ref 11.5–15.5)
WBC: 8.9 10*3/uL (ref 4.0–10.5)
nRBC: 0 % (ref 0.0–0.2)

## 2023-04-01 LAB — BASIC METABOLIC PANEL
Anion gap: 14 (ref 5–15)
BUN: 13 mg/dL (ref 8–23)
CO2: 23 mmol/L (ref 22–32)
Calcium: 8.9 mg/dL (ref 8.9–10.3)
Chloride: 102 mmol/L (ref 98–111)
Creatinine, Ser: 1.2 mg/dL — ABNORMAL HIGH (ref 0.44–1.00)
GFR, Estimated: 49 mL/min — ABNORMAL LOW (ref >60)
Glucose, Bld: 133 mg/dL — ABNORMAL HIGH (ref 70–99)
Potassium: 3.9 mmol/L (ref 3.5–5.1)
Sodium: 139 mmol/L (ref 135–145)

## 2023-04-01 MED ORDER — HYDROCODONE-ACETAMINOPHEN 5-325 MG PO TABS: 2.0000 | ORAL_TABLET | ORAL | 0 refills | Status: DC | PRN

## 2023-04-01 MED ORDER — MORPHINE SULFATE (PF) 4 MG/ML IV SOLN
4.0000 mg | Freq: Once | INTRAVENOUS | Status: AC
  Administered 2023-04-01: 4 mg via INTRAVENOUS
  Filled 2023-04-01: qty 1

## 2023-04-01 NOTE — ED Triage Notes (Signed)
Pt came into the emergency room, because of a fall this morning after standing a stepstool. She report trying to reach for something in her cabinet and the leg of the stepstool bend under. Patient report falling and hitting her right shoulder and her buttocks. After falling she c/o having shortness, pain in the abdomen and lower back. Patient report her pain is 10/10 and sharp. She denies hitting her head, and report she is not on thinners. Patient was placed on 2 L of 02 when she go in the room. She does not use oxygen at home. Hx of Asthma. She is A & 0 x 4.

## 2023-04-01 NOTE — ED Provider Notes (Signed)
Kayla Bean EMERGENCY DEPARTMENT AT [facility] Provider Note   CSN: [id number] Arrival date & time: 04/01/23  4098     History  Chief Complaint  Patient presents with   Fall   Respiratory Distress    Kayla Bean is a 67 y.o. female.  67 year old female with prior medical history as detailed below presents for evaluation.  Patient reports that she was at home beginning to cook her Sunday meal.  She read something from high cabinet.  She used a plastic stepstool to step up.  The leg of the plastic stepstool bent underneath her weight and she fell backwards.  She landed on her back and then onto her right shoulder.  She did not hit her head.  She did not pass out.  She denies neck pain.  She complains of significant pain to the right shoulder.  She is able to get up after the fall and drive her self to the hospital.  The history is provided by the patient and medical records.       Home Medications Prior to Admission medications   Medication Sig Start Date End Date Taking? Authorizing Provider  albuterol (VENTOLIN HFA) 108 (90 Base) MCG/ACT inhaler Inhale 2 puffs into the lungs every 6 (six) hours as needed for wheezing or shortness of breath. 10/25/22   Marcine Matar, MD  amLODipine (NORVASC) 10 MG tablet Take 1 tablet (10 mg total) by mouth daily. 02/02/23   Marcine Matar, MD  aspirin 81 MG tablet Take 1 tablet (81 mg total) daily by mouth. 07/30/17   Marcine Matar, MD  Blood Glucose Monitoring Suppl (ACCU-CHEK GUIDE ME) w/Device KIT 1 Device by Does not apply route daily. 05/12/21   Marcine Matar, MD  brimonidine-timolol (COMBIGAN) 0.2-0.5 % ophthalmic solution Place 1 drop into both eyes every 12 (twelve) hours.    [provider]  celecoxib (CELEBREX) 200 MG capsule Take 200 mg by mouth daily. 07/04/22   [provider]  cycloSPORINE (RESTASIS) 0.05 % ophthalmic emulsion Place 1 drop into both eyes 2 (two) times daily.     [provider]  glipiZIDE (GLUCOTROL) 10 MG tablet TAKE 1 TABLET BY MOUTH TWICE  DAILY BEFORE MEALS 07/31/22   Marcine Matar, MD  glucose blood (ACCU-CHEK GUIDE) test strip Use as instructed 04/25/19   Marcine Matar, MD  Lancet Devices [facility]) lancets Use as instructed for 3 times daily testing of blood glucose 08/09/16   Langeland, Dawn T, MD  Lancets Misc. (ACCU-CHEK FASTCLIX LANCET) KIT Use as directed 04/25/19   Marcine Matar, MD  latanoprost (XALATAN) 0.005 % ophthalmic solution Place 1 drop into both eyes at bedtime.     [provider]  levothyroxine (SYNTHROID) 100 MCG tablet Take 1 tablet (100 mcg total) by mouth daily before breakfast. 02/02/23   Marcine Matar, MD  lisinopril (ZESTRIL) 20 MG tablet TAKE 1 TABLET BY MOUTH DAILY 01/09/23   Marcine Matar, MD  pravastatin (PRAVACHOL) 40 MG tablet TAKE 1 TABLET BY MOUTH DAILY 06/29/22   Marcine Matar, MD  sitaGLIPtin (JANUVIA) 100 MG tablet Take 1 tablet (100 mg total) by mouth daily. 02/02/23   Marcine Matar, MD      Allergies    Marcelline Deist [dapagliflozin], Latex, Metformin, Metformin and related, Penicillin g, Penicillins, Warfarin sodium, Warfarin sodium, and Iodinated contrast media    Review of Systems   Review of Systems  All other systems reviewed and are negative.  Physical Exam Updated Vital Signs BP 118/73 (BP Location: Left Arm)   Pulse 60   Temp 98.2 F (36.8 C)   Resp 19   Ht 5\' 2"  (1.575 m)   Wt 107 kg   SpO2 100%   BMI 43.16 kg/m  Physical Exam Vitals and nursing note reviewed.  Constitutional:      General: She is not in acute distress.    Appearance: Normal appearance. She is well-developed.  HENT:     Head: Normocephalic and atraumatic.  Eyes:     Conjunctiva/sclera: Conjunctivae normal.     Pupils: Pupils are equal, round, and reactive to light.  Cardiovascular:     Rate and Rhythm: Normal rate and regular rhythm.     Heart sounds: Normal  heart sounds.  Pulmonary:     Effort: Pulmonary effort is normal. No respiratory distress.     Breath sounds: Normal breath sounds.  Abdominal:     General: There is no distension.     Palpations: Abdomen is soft.     Tenderness: There is no abdominal tenderness.  Musculoskeletal:        General: No deformity. Normal range of motion.     Cervical back: Normal range of motion and neck supple.  Skin:    General: Skin is warm and dry.  Neurological:     General: No focal deficit present.     Mental Status: She is alert and oriented to person, place, and time.     ED Results / Procedures / Treatments   Labs (all labs ordered are listed, but only abnormal results are displayed) Labs Reviewed  CBC WITH DIFFERENTIAL/PLATELET - Abnormal; Notable for the following components:      Result Value   Abs Immature Granulocytes 0.09 (*)    All other components within normal limits  BASIC METABOLIC PANEL - Abnormal; Notable for the following components:   Glucose, Bld 133 (*)    Creatinine, Ser 1.20 (*)    GFR, Estimated 49 (*)    All other components within normal limits    EKG None  Radiology DG Lumbar Spine Complete  Result Date: 04/01/2023 CLINICAL DATA:  Fall. EXAM: LUMBAR SPINE - COMPLETE 4 VIEW COMPARISON:  CT of the abdomen and pelvis 02/24/2019 FINDINGS: Slight degenerative anterolisthesis at L4-5 is stable. Alignment is otherwise anatomic. Lumbar lordosis is preserved. Facet degenerative changes are again noted at L4-5 and L5-S1. IMPRESSION: 1. No acute abnormality or significant interval change. 2. Degenerative changes in the lower lumbar spine. Electronically Signed   By: Marin Roberts M.D.   On: 04/01/2023 11:43   DG Ribs Unilateral W/Chest Right  Result Date: 04/01/2023 CLINICAL DATA:  Fall, right rib pain EXAM: RIGHT RIBS AND CHEST - 3+ VIEW COMPARISON:  09/17/2021 FINDINGS: Heart size appears to be within normal limits. Low lung volumes. Bibasilar atelectasis. No  large pleural effusion. No pneumothorax. No obvious displaced right rib fracture. Nondiagnostic assessment of the lower right ribs secondary to poor penetration related to patient body habitus. IMPRESSION: 1. No obvious displaced right rib fracture. Nondiagnostic assessment of the lower right ribs secondary to poor penetration related to patient body habitus. 2. Low lung volumes with bibasilar atelectasis. Electronically Signed   By: Duanne Guess D.O.   On: 04/01/2023 11:43   DG Shoulder Right  Result Date: 04/01/2023 CLINICAL DATA:  Fall.  Right shoulder pain. EXAM: RIGHT SHOULDER - 3 VIEW COMPARISON:  Two-view chest x-ray 09/17/2021 FINDINGS: Degenerative changes are present the right shoulder, including  the [facility] joint. Shoulder is located. No acute bone or soft tissue abnormality is present. IMPRESSION: 1. Degenerative changes of the right shoulder. 2. No acute abnormality. Electronically Signed   By: Marin Roberts M.D.   On: 04/01/2023 11:42   DG Pelvis 1-2 Views  Result Date: 04/01/2023 CLINICAL DATA:  Fall. EXAM: PELVIS - 1 VIEW COMPARISON:  Abdomen radiographs 10/09/2012. FINDINGS: There is no evidence of pelvic fracture or diastasis. No pelvic bone lesions are seen. IMPRESSION: No acute abnormality. Electronically Signed   By: Marin Roberts M.D.   On: 04/01/2023 11:39    Procedures Procedures    Medications Ordered in ED Medications  morphine (PF) 4 MG/ML injection 4 mg (4 mg Intravenous Given 04/01/23 1100)    ED Course/ Medical Decision Making/ A&P                             Medical Decision Making Amount and/or Complexity of Data Reviewed Labs: ordered. Radiology: ordered.  Risk Prescription drug management.    Medical Screen Complete  This patient presented to the ED with complaint of fall, right shoulder pain.  This complaint involves an extensive number of treatment options. The initial differential diagnosis includes, but is not limited to, trauma  from fall  This presentation is: Acute, Self-Limited, Previously Undiagnosed, Uncertain Prognosis, Complicated, Systemic Symptoms, and Threat to Life/Bodily Function  Patient is presenting with complaint of right shoulder pain after fall.  Patient's exam suggest more contusion versus possible fracture.  Imaging does not show fracture or other significant traumatic injury.  Patient does feel improved after ED evaluation.  Patient appropriate for discharge.  Sling provided for comfort.  Patient understands need for close outpatient follow-up with orthopedics.  Strict return precautions given and understood.  Additional history obtained: External records from outside sources obtained and reviewed including prior ED visits and prior Inpatient records.    Lab Tests:  I ordered and personally interpreted labs.    Imaging Studies ordered:  I ordered imaging studies including plain films of right shoulder, right ribs, lumbar spine, and pelvis I independently visualized and interpreted obtained imaging which showed NAD I agree with the radiologist interpretation.   Cardiac Monitoring:  The patient was maintained on a cardiac monitor.  I personally viewed and interpreted the cardiac monitor which showed an underlying rhythm of: NSR   Medicines ordered:  I ordered medication including morphine for pain Reevaluation of the patient after these medicines showed that the patient: improved   Problem List / ED Course:  Fall, right shoulder pain   Reevaluation:  After the interventions noted above, I reevaluated the patient and found that they have: improved   Disposition:  After consideration of the diagnostic results and the patients response to treatment, I feel that the patent would benefit from close outpatient follow-up.          Final Clinical Impression(s) / ED Diagnoses Final diagnoses:  Fall, initial encounter  Acute pain of right shoulder    Rx / DC  Orders ED Discharge Orders          Ordered    HYDROcodone-acetaminophen (NORCO/VICODIN) 5-325 MG tablet  Every 4 hours PRN        04/01/23 1253              Wynetta Fines, MD 04/01/23 1255

## 2023-04-01 NOTE — Discharge Instructions (Addendum)
Return for any problem.   Use sling as instructed for comfort.  Take pain medicine as needed for pain.  Follow-up closely with your PCP and also with orthopedics as instructed.

## 2023-04-01 NOTE — Progress Notes (Signed)
Orthopedic Tech Progress Note Patient Details:  Kayla Bean 1956-05-27 [id number]  Ortho Devices Type of Ortho Device: Shoulder immobilizer Ortho Device/Splint Location: RUE Ortho Device/Splint Interventions: Ordered, Application, Adjustment   Post Interventions Patient Tolerated: Well Instructions Provided: Care of device, Adjustment of device  [place] A Nashla Althoff 04/01/2023, 1:11 PM

## 2023-04-02 DIAGNOSIS — M25511 Pain in right shoulder: Secondary | ICD-10-CM | POA: Diagnosis not present

## 2023-04-02 DIAGNOSIS — M1712 Unilateral primary osteoarthritis, left knee: Secondary | ICD-10-CM | POA: Diagnosis not present

## 2023-04-02 DIAGNOSIS — M79641 Pain in right hand: Secondary | ICD-10-CM | POA: Diagnosis not present

## 2023-04-13 ENCOUNTER — Other Ambulatory Visit: Payer: Self-pay | Admitting: Internal Medicine

## 2023-04-13 DIAGNOSIS — E1169 Type 2 diabetes mellitus with other specified complication: Secondary | ICD-10-CM

## 2023-04-16 NOTE — Telephone Encounter (Signed)
Requested Prescriptions  Pending Prescriptions Disp Refills   pravastatin (PRAVACHOL) 40 MG tablet [Pharmacy Med Name: Pravastatin Sodium 40 MG Oral Tablet] 100 tablet 0    Sig: TAKE 1 TABLET BY MOUTH DAILY     Cardiovascular:  Antilipid - Statins Failed - 04/13/2023 10:35 PM      Failed - Lipid Panel in normal range within the last 12 months    Cholesterol, Total  Date Value Ref Range Status  02/02/2023 127 100 - 199 mg/dL Final   LDL Chol Calc (NIH)  Date Value Ref Range Status  02/02/2023 64 0 - 99 mg/dL Final   HDL  Date Value Ref Range Status  02/02/2023 41 >39 mg/dL Final   Triglycerides  Date Value Ref Range Status  02/02/2023 122 0 - 149 mg/dL Final         Passed - Patient is not pregnant      Passed - Valid encounter within last 12 months    Recent Outpatient Visits           2 months ago Type 2 diabetes mellitus with morbid obesity (HCC)   [place] [facility] Jonah Blue B, MD   6 months ago Type 2 diabetes mellitus with morbid obesity [facility])   [place] [facility] Jonah Blue B, MD   10 months ago Type 2 diabetes mellitus with morbid obesity [facility])   [place] [facility] Marcine Matar, MD   1 year ago Encounter for [employer] annual wellness exam   [facility] [place], Leola L, RPH-CPP   1 year ago Type 2 diabetes mellitus with morbid obesity [facility])   [place] [facility] Marcine Matar, MD       Future Appointments             In 3 weeks Marcine Matar, MD [facility]

## 2023-05-07 ENCOUNTER — Ambulatory Visit: Payer: 59 | Attending: Internal Medicine | Admitting: Internal Medicine

## 2023-05-07 ENCOUNTER — Encounter: Payer: Self-pay | Admitting: Internal Medicine

## 2023-05-07 DIAGNOSIS — Z7985 Long-term (current) use of injectable non-insulin antidiabetic drugs: Secondary | ICD-10-CM

## 2023-05-07 DIAGNOSIS — E890 Postprocedural hypothyroidism: Secondary | ICD-10-CM

## 2023-05-07 DIAGNOSIS — E1159 Type 2 diabetes mellitus with other circulatory complications: Secondary | ICD-10-CM

## 2023-05-07 DIAGNOSIS — L309 Dermatitis, unspecified: Secondary | ICD-10-CM | POA: Diagnosis not present

## 2023-05-07 DIAGNOSIS — E1169 Type 2 diabetes mellitus with other specified complication: Secondary | ICD-10-CM

## 2023-05-07 DIAGNOSIS — R21 Rash and other nonspecific skin eruption: Secondary | ICD-10-CM

## 2023-05-07 DIAGNOSIS — E785 Hyperlipidemia, unspecified: Secondary | ICD-10-CM | POA: Diagnosis not present

## 2023-05-07 DIAGNOSIS — I152 Hypertension secondary to endocrine disorders: Secondary | ICD-10-CM | POA: Diagnosis not present

## 2023-05-07 DIAGNOSIS — Z7984 Long term (current) use of oral hypoglycemic drugs: Secondary | ICD-10-CM | POA: Diagnosis not present

## 2023-05-07 DIAGNOSIS — E119 Type 2 diabetes mellitus without complications: Secondary | ICD-10-CM

## 2023-05-07 LAB — GLUCOSE, POCT (MANUAL RESULT ENTRY): POC Glucose: 110 mg/dl — AB (ref 70–99)

## 2023-05-07 LAB — POCT GLYCOSYLATED HEMOGLOBIN (HGB A1C): HbA1c, POC (controlled diabetic range): 6.6 % (ref 0.0–7.0)

## 2023-05-07 MED ORDER — ACCU-CHEK GUIDE VI STRP: ORAL_STRIP | 12 refills | Status: DC

## 2023-05-07 MED ORDER — ACCU-CHEK SOFTCLIX LANCETS MISC: 12 refills | Status: DC

## 2023-05-07 MED ORDER — TRULICITY 0.75 MG/0.5ML ~~LOC~~ SOAJ: 0.7500 mg | SUBCUTANEOUS | 2 refills | Status: DC

## 2023-05-07 MED ORDER — ACCU-CHEK GUIDE W/DEVICE KIT: PACK | 0 refills | Status: DC

## 2023-05-07 MED ORDER — TRIAMCINOLONE ACETONIDE 0.1 % EX CREA: 1.0000 | TOPICAL_CREAM | Freq: Two times a day (BID) | CUTANEOUS | 0 refills | Status: DC

## 2023-05-07 NOTE — Progress Notes (Signed)
Patient ID: Kayla Bean, female    DOB: 10/31/1955  MRN: [id number]  CC: Diabetes (DM f/u./Requesting a new BS lancet pen/Requesting weight loss options - [facility]Hock )   Subjective: Kayla Bean is a 67 y.o. female who presents for chronic ds management Her concerns today include:  Patient with history of diabetes, hypertension, HL, post thyroidectomy hypothyroidism (1973, noncancerous), asthma, CKD stage III, OA knee and obesity, glaucoma, mild intermittent asthma.   Since last visit with me, she fell at home in her kitchen trying to reach something in the cupboard.  Landed on her right shoulder.  Seen in the emergency room.  No fracture.  Degenerative changes seen.  She has since followed up with orthopedics.  Right shoulder is better.  DM: Results for orders placed or performed in visit on 05/07/23  POCT glycosylated hemoglobin (Hb A1C)  Result Value Ref Range   Hemoglobin A1C     HbA1c POC (<> result, manual entry)     HbA1c, POC (prediabetic range)     HbA1c, POC (controlled diabetic range) 6.6 0.0 - 7.0 %  POCT glucose (manual entry)  Result Value Ref Range   POC Glucose 110 (A) 70 - 99 mg/dl  Compliant with Glucotrol 10 mg BID and Januvia 100 mg.  Would like to try Wegovy to assist with weight loss. No low BS Going to [employer] 5 days a week for water aerobics.  She is enjoying it.  Doing okay with eating habits. Eye appt next mth Needs new glucometer.  HTN:  Compliant with taking Norvasc 10 mg daily and lisinopril 20 mg daily for blood pressure.  Limits salt No CP/SOB Checks BP a few times a mth.  BP this a.m 122/61, 110/59 HL:  Compliant with Pravachol for the cholesterol. Taking and tolerating the medication.  Thyroid: Compliant with levothyroxine 100 mcg daily.  Last TSH in January of this year was normal.  Complains of itchy rash on the knuckles of both middle fingers times several months.  Also has an area on the right upper cheek. Patient Active Problem List    Diagnosis Date Noted   Controlled type 2 diabetes mellitus without complication, without long-term current use of insulin (HCC) 07/08/2020   Class 3 severe obesity with serious comorbidity and body mass index (BMI) of 45.0 to 49.9 in adult (HCC) 10/01/2018   Moderate persistent asthma without complication 07/30/2017   Hyperlipidemia 03/22/2017   Cholelithiasis 12/24/2012   LIPOMA 07/26/2010   CONSTIPATION, CHRONIC 01/18/2009   EUSTACHIAN TUBE DYSFUNCTION, RIGHT 10/16/2008   LOM 09/01/2008   Essential hypertension 08/18/2008   Osteoarthritis of left knee 04/23/2008   Hypothyroidism 10/15/2007     Current Outpatient Medications on File Prior to Visit  Medication Sig Dispense Refill   albuterol (VENTOLIN HFA) 108 (90 Base) MCG/ACT inhaler Inhale 2 puffs into the lungs every 6 (six) hours as needed for wheezing or shortness of breath. 18 g 2   amLODipine (NORVASC) 10 MG tablet Take 1 tablet (10 mg total) by mouth daily. 100 tablet 1   aspirin 81 MG tablet Take 1 tablet (81 mg total) daily by mouth. 90 tablet 0   brimonidine-timolol (COMBIGAN) 0.2-0.5 % ophthalmic solution Place 1 drop into both eyes every 12 (twelve) hours.     celecoxib (CELEBREX) 200 MG capsule Take 200 mg by mouth daily.     cycloSPORINE (RESTASIS) 0.05 % ophthalmic emulsion Place 1 drop into both eyes 2 (two) times daily.     glipiZIDE (GLUCOTROL) 10  MG tablet TAKE 1 TABLET BY MOUTH TWICE  DAILY BEFORE MEALS 200 tablet 1   HYDROcodone-acetaminophen (NORCO/VICODIN) 5-325 MG tablet Take 2 tablets by mouth every 4 (four) hours as needed. 10 tablet 0   Lancet Devices (ACCU-CHEK SOFTCLIX) lancets Use as instructed for 3 times daily testing of blood glucose 1 each 0   Lancets Misc. (ACCU-CHEK FASTCLIX LANCET) KIT Use as directed 1 kit 12   latanoprost (XALATAN) 0.005 % ophthalmic solution Place 1 drop into both eyes at bedtime.      levothyroxine (SYNTHROID) 100 MCG tablet Take 1 tablet (100 mcg total) by mouth daily before  breakfast. 100 tablet 0   lisinopril (ZESTRIL) 20 MG tablet TAKE 1 TABLET BY MOUTH DAILY 100 tablet 1   pravastatin (PRAVACHOL) 40 MG tablet TAKE 1 TABLET BY MOUTH DAILY 100 tablet 0   sitaGLIPtin (JANUVIA) 100 MG tablet Take 1 tablet (100 mg total) by mouth daily. 100 tablet 1   No current facility-administered medications on file prior to visit.    Allergies  Allergen Reactions   Farxiga [Dapagliflozin] Other (See Comments)    Recurrent vaginal yeast   Latex Hives and Other (See Comments)   Metformin     Other reaction(s): hair loss   Metformin And Related     Hair loss   Penicillin G Other (See Comments)   Penicillins Hives   Warfarin Sodium Hives    REACTION: Got rash when took one pill of coumadin  related to knee replacement surgery in 2007.   Warfarin Sodium Other (See Comments)   Iodinated Contrast Media Other (See Comments)    PT STATES SHE HAD AN ALLERGIC REACTION OF BLISTERS ON HANDS AND FEET 2 DAYS AFTER CT SCAN W/ IV CONTRAST INJECTION, DR. MANSELL SUGGESTS 13 HR PREP//A.C.    Social History   Socioeconomic History   Marital status: Single    Spouse name: Not on file   Number of children: Not on file   Years of education: Not on file   Highest education level: Not on file  Occupational History   Not on file  Tobacco Use   Smoking status: Former    Current packs/day: 0.00    Types: Cigarettes    Quit date: 09/18/2001    Years since quitting: 21.6   Smokeless tobacco: Never  Substance and Sexual Activity   Alcohol use: Yes   Drug use: No   Sexual activity: Not Currently  Other Topics Concern   Not on file  Social History Narrative   Not on file   Social Determinants of Health   Financial Resource Strain: Low Risk  (02/27/2023)   Overall Financial Resource Strain (CARDIA)    Difficulty of Paying Living Expenses: Not hard at all  Food Insecurity: No Food Insecurity (02/27/2023)   Hunger Vital Sign    Worried About Running Out of Food in the Last Year:  Never true    Ran Out of Food in the Last Year: Never true  Transportation Needs: No Transportation Needs (02/27/2023)   PRAPARE - Administrator, Civil Service (Medical): No    Lack of Transportation (Non-Medical): No  Physical Activity: Inactive (02/27/2023)   Exercise Vital Sign    Days of Exercise per Week: 0 days    Minutes of Exercise per Session: 0 min  Stress: No Stress Concern Present (02/27/2023)   [employer] of Occupational Health - Occupational Stress Questionnaire    Feeling of Stress : Not at all  Social Connections:  Moderately Isolated (02/27/2023)   Social Connection and Isolation Panel [NHANES]    Frequency of Communication with Friends and Family: More than three times a week    Frequency of Social Gatherings with Friends and Family: Three times a week    Attends Religious Services: More than 4 times per year    Active Member of Clubs or Organizations: No    Attends Banker Meetings: Never    Marital Status: Never married  Intimate Partner Violence: Not At Risk (02/27/2023)   Humiliation, Afraid, Rape, and Kick questionnaire    Fear of Current or Ex-Partner: No    Emotionally Abused: No    Physically Abused: No    Sexually Abused: No    Family History  Problem Relation Age of Onset   Asthma Other    Cancer Other    Hypertension Other    Diabetes Other     Past Surgical History:  Procedure Laterality Date   ABDOMINAL HYSTERECTOMY     CHOLECYSTECTOMY N/A 02/24/2019   Procedure: LAPAROSCOPIC CHOLECYSTECTOMY WITH INTRAOPERATIVE CHOLANGIOGRAM;  Surgeon: Manus Rudd, MD;  Location: [facility] OR;  Service: General;  Laterality: N/A;   KNEE SURGERY     left   THYROIDECTOMY     TOTAL KNEE ARTHROPLASTY     right knee    ROS: Review of Systems Negative except as stated above  PHYSICAL EXAM: BP 130/65   Pulse 66   Temp 98.2 F (36.8 C) (Oral)   Ht 5\' 2"  (1.575 m)   Wt 235 lb (106.6 kg)   SpO2 99%   BMI 42.98 kg/m   Wt Readings  from Last 3 Encounters:  05/07/23 235 lb (106.6 kg)  04/01/23 236 lb (107 kg)  02/27/23 239 lb (108.4 kg)    Physical Exam  General appearance - alert, well appearing, and in no distress Mental status - normal mood, behavior, speech, dress, motor activity, and thought processes Neck - supple, no significant adenopathy Chest - clear to auscultation, no wheezes, rales or rhonchi, symmetric air entry Heart - normal rate, regular rhythm, normal S1, S2, no murmurs, rubs, clicks or gallops Extremities - peripheral pulses normal, no pedal edema, no clubbing or cyanosis Skin -mildly hyperpigmented macular 2 cm area on the upper right cheek.  Hyperpigmented macular rash on knuckles of both middle fingers.      Latest Ref Rng & Units 04/01/2023   10:52 AM 02/02/2023    5:12 PM 10/09/2022   12:09 PM  CMP  Glucose 70 - 99 mg/dL 147   829   BUN 8 - 23 mg/dL 13   14   Creatinine 5.62 - 1.00 mg/dL 1.30   8.65   Sodium 784 - 145 mmol/L 139   140   Potassium 3.5 - 5.1 mmol/L 3.9   4.4   Chloride 98 - 111 mmol/L 102   101   CO2 22 - 32 mmol/L 23   24   Calcium 8.9 - 10.3 mg/dL 8.9   9.3   Total Protein 6.0 - 8.5 g/dL  7.9    Total Bilirubin 0.0 - 1.2 mg/dL  0.4    Alkaline Phos 44 - 121 IU/L  131    AST 0 - 40 IU/L  14    ALT 0 - 32 IU/L  11     Lipid Panel     Component Value Date/Time   CHOL 127 02/02/2023 1712   TRIG 122 02/02/2023 1712   HDL 41 02/02/2023 1712   CHOLHDL  3.1 02/02/2023 1712   CHOLHDL 3.6 03/14/2016 1238   VLDL 12 03/14/2016 1238   LDLCALC 64 02/02/2023 1712    CBC    Component Value Date/Time   WBC 8.9 04/01/2023 1052   RBC 4.37 04/01/2023 1052   HGB 13.7 04/01/2023 1052   HGB 13.2 01/31/2022 1158   HCT 41.2 04/01/2023 1052   HCT 38.4 01/31/2022 1158   PLT 211 04/01/2023 1052   PLT 273 01/31/2022 1158   MCV 94.3 04/01/2023 1052   MCV 90 01/31/2022 1158   MCH 31.4 04/01/2023 1052   MCHC 33.3 04/01/2023 1052   RDW 12.7 04/01/2023 1052   RDW 12.8  01/31/2022 1158   LYMPHSABS 1.4 04/01/2023 1052   MONOABS 0.4 04/01/2023 1052   EOSABS 0.4 04/01/2023 1052   BASOSABS 0.1 04/01/2023 1052    ASSESSMENT AND PLAN: 1. Type 2 diabetes mellitus with morbid obesity (HCC) At goal. Commended her on starting regular exercise routine.  Encouraged her to continue.  Continue healthy eating habits. We discussed trying her with Trulicity since this is the GLP that is covered by her insurance.  Went over with her how the medication works and possible side effects.  Advised to expect some nausea the first several weeks.  Advised that the medication can cause vomiting, pancreatitis, bowel obstruction, constipation, diarrhea, palpitations.   Advised to stop the medicine and be seen if she develops vomiting or pain in the abdomen as this can be indicative of pancreatitis or bowel obstruction.  She was agreeable to trying the medicine.  Clinical pharmacist saw her today to teach administration.  Start at the 0.75 mg dose.  Will have her follow-up with me in about 6 weeks. - POCT glycosylated hemoglobin (Hb A1C) - POCT glucose (manual entry) - Dulaglutide (TRULICITY) 0.75 MG/0.5ML SOPN; Inject 0.75 mg into the skin once a week.  Dispense: 2 mL; Refill: 2 - glucose blood (ACCU-CHEK GUIDE) test strip; Use as instructed to check blood sugars once a day.  Dispense: 100 each; Refill: 12 - Accu-Chek Softclix Lancets lancets; Use as instructed to check blood sugars once a day.  Dispense: 100 each; Refill: 12 - Blood Glucose Monitoring Suppl (ACCU-CHEK GUIDE) w/Device KIT; Use as directed to check blood sugars once a day.  Dispense: 1 kit; Refill: 0  2. Diabetes mellitus treated with oral medication (HCC) Advised to stop Januvia and glipizide once she starts the Trulicity.  3. Long-term (current) use of injectable non-insulin antidiabetic drugs To start Trulicity  4. Hypertension associated with diabetes (HCC) At goal.  Continue lisinopril 20 mg daily and Norvasc 10  mg daily.  5. Hyperlipidemia associated with type 2 diabetes mellitus (HCC) Continue pravastatin.  6. Postoperative hypothyroidism Continue levothyroxine 100 mcg daily.  7. Dermatitis of face  - Ambulatory referral to Dermatology  8. Rash of both hands - triamcinolone cream (KENALOG) 0.1 %; Apply 1 Application topically 2 (two) times daily. Do not put on face  Dispense: 30 g; Refill: 0    Patient was given the opportunity to ask questions.  Patient verbalized understanding of the plan and was able to repeat key elements of the plan.   This documentation was completed using Paediatric nurse.  Any transcriptional errors are unintentional.  Orders Placed This Encounter  Procedures   Ambulatory referral to Dermatology   POCT glycosylated hemoglobin (Hb A1C)   POCT glucose (manual entry)     Requested Prescriptions   Signed Prescriptions Disp Refills   Dulaglutide (TRULICITY) 0.75 MG/0.5ML SOPN  2 mL 2    Sig: Inject 0.75 mg into the skin once a week.   triamcinolone cream (KENALOG) 0.1 % 30 g 0    Sig: Apply 1 Application topically 2 (two) times daily. Do not put on face   glucose blood (ACCU-CHEK GUIDE) test strip 100 each 12    Sig: Use as instructed to check blood sugars once a day.   Accu-Chek Softclix Lancets lancets 100 each 12    Sig: Use as instructed to check blood sugars once a day.   Blood Glucose Monitoring Suppl (ACCU-CHEK GUIDE) w/Device KIT 1 kit 0    Sig: Use as directed to check blood sugars once a day.    Return in about 6 weeks (around 06/18/2023) for f/u obesity and Trulicity.  Jonah Blue, MD, FACP

## 2023-05-07 NOTE — Patient Instructions (Addendum)
STOP GLIPIZIDE and JANUVIA once you start TRULICITY.    Dulaglutide Injection What is this medication? DULAGLUTIDE (DOO la GLOO tide) treats type 2 diabetes. It works by increasing insulin levels in your body, which decreases your blood sugar (glucose).   It also reduces the amount of sugar released into your blood and slows down your digestion. It may also be used to lower the risk of heart attack and stroke in people with type 2 diabetes. Changes to diet and exercise are often combined with this medication. This medicine may be used for other purposes; ask your health care provider or pharmacist if you have questions. COMMON BRAND NAME(S): Trulicity What should I tell my care team before I take this medication? They need to know if you have any of these conditions: Endocrine tumors (MEN 2) or if someone in your family had these tumors Eye disease, vision problems History of pancreatitis Kidney disease Liver disease Stomach or intestine problems Thyroid cancer or if someone in your family had thyroid cancer An unusual or allergic reaction to dulaglutide, other medications, foods, dyes, or preservatives Pregnant or trying to get pregnant Breast-feeding How should I use this medication? This medication is injected under the skin. You will be taught how to prepare and give it. Take it as directed on the prescription label on the same day of each week. Do NOT prime the pen. Keep taking it unless your care team tells you to stop. If you use this medication with insulin, you should inject this medication and the insulin separately. Do not mix them together. Do not give the injections right next to each other. Change (rotate) injection sites with each injection. This medication comes with INSTRUCTIONS FOR USE. Ask your pharmacist for directions on how to use this medication. Read the information carefully. Talk to your pharmacist or care team if you have questions. It is important that you put your  used needles and syringes in a special sharps container. Do not put them in a trash can. If you do not have a sharps container, call your pharmacist or care team to get one. A special MedGuide will be given to you by the pharmacist with each prescription and refill. Be sure to read this information carefully each time. Talk to your care team about the use of this medication in children. While it may be prescribed for children as young as 10 years for selected conditions, precautions do apply. Overdosage: If you think you have taken too much of this medicine contact a poison control center or emergency room at once. NOTE: This medicine is only for you. Do not share this medicine with others. What if I miss a dose? If you miss a dose, take it as soon as you can unless it is more than 3 days late. If it is more than 3 days late, skip the missed dose. Take the next dose at the normal time. What may interact with this medication? Other medications for diabetes Many medications may cause changes in blood sugar, these include: Alcohol Antiviral medications for HIV or AIDS Aspirin and aspirin-like medications Certain medications for blood pressure, heart disease, irregular heartbeat Chromium Diuretics Estrogen or progestin hormones Fenofibrate Gemfibrozil Isoniazid Lanreotide MAOIs, such as Carbex, Eldepryl, Marplan, Nardil, or Parnate Medications for allergies, asthma, cold, or cough Medications for mental health conditions Medications for weight loss Niacin Nicotine NSAIDs, medications for pain and inflammation, such as ibuprofen or naproxen Octreotide Pasireotide Pentamidine Phenytoin Probenecid Quinolone antibiotics such as ciprofloxacin, levofloxacin, or  ofloxacin Some herbal dietary supplements Steroid medications, such as prednisone or cortisone Sulfamethoxazole; trimethoprim Testosterone or anabolic steroids Thyroid hormones Some medications can hide the warning symptoms of low  blood sugar (hypoglycemia). You may need to monitor your blood sugar more closely if you are taking one of these medications. These include: Beta blockers, such as atenolol, metoprolol, or propranolol Clonidine Guanethidine Reserpine This list may not describe all possible interactions. Give your health care provider a list of all the medicines, herbs, non-prescription drugs, or dietary supplements you use. Also tell them if you smoke, drink alcohol, or use illegal drugs. Some items may interact with your medicine. What should I watch for while using this medication? Visit your care team for regular checks on your progress. Check with your care team if you have severe diarrhea, nausea, and vomiting, or if you sweat a lot. The loss of too much body fluid may make it dangerous for you to take this medication. A test called the HbA1C (A1C) will be monitored. This is a simple blood test. It measures your blood sugar control over the last 2 to 3 months. You will receive this test every 3 to 6 months. Learn how to check your blood sugar. Learn the symptoms of low and high blood sugar and how to manage them. Always carry a quick-source of sugar with you in case you have symptoms of low blood sugar. Examples include hard sugar candy or glucose tablets. Make sure others know that you can choke if you eat or drink when you develop serious symptoms of low blood sugar, such as seizures or unconsciousness. Get medical help at once. Tell your care team if you have high blood sugar. You might need to change the dose of your medication. If you are sick or exercising more than usual, you may need to change the dose of your medication. Do not skip meals. Ask your care team if you should avoid alcohol. Many nonprescription cough and cold products contain sugar or alcohol. These can affect blood sugar. Pens should never be shared. Even if the needle is changed, sharing may result in passing of viruses like hepatitis or  HIV. Wear a medical ID bracelet or chain. Carry a card that describes your condition. List the medications and doses you take on the card. What side effects may I notice from receiving this medication? Side effects that you should report to your care team as soon as possible: Allergic reactions--skin rash, itching, hives, swelling of the face, lips, tongue, or throat Change in vision Dehydration--increased thirst, dry mouth, feeling faint or lightheaded, headache, dark yellow or brown urine Kidney injury--decrease in the amount of urine, swelling of the ankles, hands, or feet Pancreatitis--severe stomach pain that spreads to your back or gets worse after eating or when touched, fever, nausea, vomiting Thoughts of suicide or self-harm, worsening mood, feelings of depression Thyroid cancer--new mass or lump in the neck, pain or trouble swallowing, trouble breathing, hoarseness Side effects that usually do not require medical attention (report these to your care team if they continue or are bothersome): Diarrhea Loss of appetite Nausea Upset stomach This list may not describe all possible side effects. Call your doctor for medical advice about side effects. You may report side effects to FDA at 1-800-FDA-1088. Where should I keep my medication? Keep out of the reach of children and pets. Refrigeration (preferred): Store unopened pens in a refrigerator between 2 and 8 degrees C (36 and 46 degrees F). Keep it in the  original carton until you are ready to take it. Do not freeze or use if the medication has been frozen. Protect from light. Get rid of any unused medication after the expiration date on the label. Room Temperature: The pen may be stored at room temperature below 30 degrees C (86 degrees F) for up to a total of 14 days if needed. Protect from light. Avoid exposure to extreme heat. If it is stored at room temperature, throw away any unused medication after 14 days or after it expires,  whichever is first. To get rid of medications that are no longer needed or have expired: Take the medication to a medication take-back program. Check with your pharmacy or law enforcement to find a location. If you cannot return the medication, ask your pharmacist or care team how to get rid of this medication safely. NOTE: This sheet is a summary. It may not cover all possible information. If you have questions about this medicine, talk to your doctor, pharmacist, or health care provider.  2024 Elsevier/Gold Standard (2022-12-11 00:00:00)

## 2023-05-22 ENCOUNTER — Other Ambulatory Visit: Payer: Self-pay | Admitting: Internal Medicine

## 2023-05-22 DIAGNOSIS — E1169 Type 2 diabetes mellitus with other specified complication: Secondary | ICD-10-CM

## 2023-05-22 MED ORDER — TRULICITY 0.75 MG/0.5ML ~~LOC~~ SOAJ
0.7500 mg | SUBCUTANEOUS | 2 refills | Status: DC
Start: 2023-05-22 — End: 2023-05-23

## 2023-05-22 NOTE — Telephone Encounter (Signed)
Requested Prescriptions  Pending Prescriptions Disp Refills   Dulaglutide (TRULICITY) 0.75 MG/0.5ML SOPN 2 mL 2    Sig: Inject 0.75 mg into the skin once a week.     Endocrinology:  Diabetes - GLP-1 Receptor Agonists Passed - 05/22/2023  5:24 PM      Passed - HBA1C is between 0 and 7.9 and within 180 days    HbA1c, POC (controlled diabetic range)  Date Value Ref Range Status  05/07/2023 6.6 0.0 - 7.0 % Final         Passed - Valid encounter within last 6 months    Recent Outpatient Visits           2 weeks ago Type 2 diabetes mellitus with morbid obesity (HCC)   [place] [facility] Jonah Blue B, MD   3 months ago Type 2 diabetes mellitus with morbid obesity [facility])   [place] [facility] Jonah Blue B, MD   7 months ago Type 2 diabetes mellitus with morbid obesity [facility])   [place] [facility] Jonah Blue B, MD   11 months ago Type 2 diabetes mellitus with morbid obesity [facility])   [place] [facility] Marcine Matar, MD   1 year ago Encounter for [employer] annual wellness exam   [facility] Drucilla Chalet, RPH-CPP       Future Appointments             In 1 month Laural Benes, Binnie Rail, MD [facility]

## 2023-05-22 NOTE — Telephone Encounter (Signed)
Medication Refill - Medication: Dulaglutide (TRULICITY) 0.75 MG/0.5ML SOPN         Has the patient contacted their pharmacy? No     Preferred Pharmacy?   [facility] - [place] - [address]  [address] [place] [address]  Phone: [phone] Fax: [phone]      Has the patient been seen for an appointment in the last year OR does the patient have an upcoming appointment? Yes

## 2023-05-23 ENCOUNTER — Other Ambulatory Visit: Payer: Self-pay | Admitting: Internal Medicine

## 2023-05-23 DIAGNOSIS — E1169 Type 2 diabetes mellitus with other specified complication: Secondary | ICD-10-CM

## 2023-05-23 NOTE — Telephone Encounter (Signed)
Medication Refill - Medication: Dulaglutide (TRULICITY) 0.75 MG/0.5ML SOPN   Has the patient contacted their pharmacy? No   Preferred Pharmacy (with phone number or street name):  [facility] - [place]),  - [address] Phone: [phone]  Fax: [phone]      Has the patient been seen for an appointment in the last year OR does the patient have an upcoming appointment? Yes.    The patient states she talked with [facility] and they told her they do not have the medicine in stock and it will probably be at least several weeks before they get it back in. She now wants to try her local pharmacy to see if they have it. Please assist patient further

## 2023-05-24 ENCOUNTER — Telehealth: Payer: Self-pay | Admitting: Internal Medicine

## 2023-05-24 MED ORDER — TRULICITY 0.75 MG/0.5ML ~~LOC~~ SOAJ: 0.7500 mg | SUBCUTANEOUS | 2 refills | Status: DC

## 2023-05-24 NOTE — Telephone Encounter (Signed)
Pt called saying [facility] [address] has the Trulicity in stock.  She ask that it be sent there.  She said send it on the 10th so ins will cover.

## 2023-05-24 NOTE — Telephone Encounter (Signed)
There is an existing refill encounter on 05/23/23 already started on this. I have copied/pasted the below information into that existing refill encounter. Refill is pending approval at this time.

## 2023-05-24 NOTE — Telephone Encounter (Signed)
Bean, [name]6 minutes ago (2:29 PM)   AM Medication Refill - Medication: Dulaglutide (TRULICITY) 0.75 MG/0.5ML SOPN    Pt called to f/u on her medication refill. Pt asked if the nurse had called the pharmacy and asked if they had the medication in stock. I explained to pt that this is something that we ask the patients to do: call around and ask if the medication is available; she can then request a transfer between pharmacies or call us to request if the pharmacy cannot help.   Please transfer mediation to pharmacy below as they have it in stock.    Has the patient contacted their pharmacy? Yes.     (Agent: If yes, when and what did the pharmacy advise?)   Preferred Pharmacy (with phone number or street name):  [facility] - [place] ([place]), [place] - [address]  [address] [place] ([place]) [place] [address]  Phone: [phone] Fax: [phone]  Hours: Not open 24 hours    Has the patient been seen for an appointment in the last year OR does the patient have an upcoming appointment? Yes.     Agent: Please be advised that RX refills may take up to 3 business days. We ask that you follow-up with your pharmacy.

## 2023-05-24 NOTE — Telephone Encounter (Signed)
Change in pharmacy- Rx routed to requested pharmacy Requested Prescriptions  Pending Prescriptions Disp Refills   Dulaglutide (TRULICITY) 0.75 MG/0.5ML SOPN 2 mL 2    Sig: Inject 0.75 mg into the skin once a week.     Endocrinology:  Diabetes - GLP-1 Receptor Agonists Passed - 05/24/2023  2:37 PM      Passed - HBA1C is between 0 and 7.9 and within 180 days    HbA1c, POC (controlled diabetic range)  Date Value Ref Range Status  05/07/2023 6.6 0.0 - 7.0 % Final         Passed - Valid encounter within last 6 months    Recent Outpatient Visits           2 weeks ago Type 2 diabetes mellitus with morbid obesity (HCC)   [facility] Jonah Blue B, MD   3 months ago Type 2 diabetes mellitus with morbid obesity [facility])   [place] [facility] Jonah Blue B, MD   7 months ago Type 2 diabetes mellitus with morbid obesity [facility])   [place] [facility] Jonah Blue B, MD   11 months ago Type 2 diabetes mellitus with morbid obesity [facility])    [place] [facility] Marcine Matar, MD   1 year ago Encounter for [employer] annual wellness exam   [facility] Drucilla Chalet, RPH-CPP       Future Appointments             In 1 month Laural Benes, Binnie Rail, MD [facility]

## 2023-05-24 NOTE — Telephone Encounter (Signed)
Medication Refill - Medication: Dulaglutide (TRULICITY) 0.75 MG/0.5ML SOPN   Pt called to f/u on her medication refill. Pt asked if the nurse had called the pharmacy and asked if they had the medication in stock. I explained to pt that this is something that we ask the patients to do: call around and ask if the medication is available; she can then request a transfer between pharmacies or call us to request if the pharmacy cannot help.  Please transfer mediation to pharmacy below as they have it in stock.   Has the patient contacted their pharmacy? Yes.    (Agent: If yes, when and what did the pharmacy advise?)  Preferred Pharmacy (with phone number or street name):  [facility] - [place] ([place]), [place] - [address]  [address] [place] ([place]) [place] [address]  Phone: [phone] Fax: [phone]  Hours: Not open 24 hours   Has the patient been seen for an appointment in the last year OR does the patient have an upcoming appointment? Yes.    Agent: Please be advised that RX refills may take up to 3 business days. We ask that you follow-up with your pharmacy.

## 2023-05-28 ENCOUNTER — Telehealth: Payer: Self-pay | Admitting: Internal Medicine

## 2023-05-28 NOTE — Telephone Encounter (Signed)
Called but no answer. LVM to call back.  

## 2023-05-28 NOTE — Telephone Encounter (Signed)
Let patient know that I received notice from  [facility] letting me know that they do not have the Trulicity in stock due to nationwide shortage. Was she able to get it at [facility]? If not I think we have it at our pharmacy and I can send prescription there.

## 2023-05-28 NOTE — Telephone Encounter (Signed)
Pt states, the Tr[facility]icity is available at [facility], she says she has enough for next Tuesday but wants to put the request in for it now. [facility]  [address] [place], [place], [place]. Phone [phone]

## 2023-05-29 NOTE — Telephone Encounter (Signed)
Rx was sent to requested pharmacy 05/24/2023.

## 2023-06-06 ENCOUNTER — Other Ambulatory Visit: Payer: Self-pay | Admitting: Internal Medicine

## 2023-06-06 DIAGNOSIS — E890 Postprocedural hypothyroidism: Secondary | ICD-10-CM

## 2023-06-12 DIAGNOSIS — H02532 Eyelid retraction right lower eyelid: Secondary | ICD-10-CM | POA: Diagnosis not present

## 2023-06-12 DIAGNOSIS — H401122 Primary open-angle glaucoma, left eye, moderate stage: Secondary | ICD-10-CM | POA: Diagnosis not present

## 2023-06-12 DIAGNOSIS — H16223 Keratoconjunctivitis sicca, not specified as Sjogren's, bilateral: Secondary | ICD-10-CM | POA: Diagnosis not present

## 2023-06-12 DIAGNOSIS — H02535 Eyelid retraction left lower eyelid: Secondary | ICD-10-CM | POA: Diagnosis not present

## 2023-06-12 DIAGNOSIS — H401111 Primary open-angle glaucoma, right eye, mild stage: Secondary | ICD-10-CM | POA: Diagnosis not present

## 2023-06-12 DIAGNOSIS — Z961 Presence of intraocular lens: Secondary | ICD-10-CM | POA: Diagnosis not present

## 2023-06-12 LAB — HM DIABETES EYE EXAM

## 2023-06-13 ENCOUNTER — Telehealth: Payer: Self-pay | Admitting: Internal Medicine

## 2023-06-13 NOTE — Telephone Encounter (Signed)
Last TSH nl. Levothyroxine dose has been stable. Advised pharmacy to fill but encourage patient to return in 6-8 weeks for r/p labs. Has an appt on 10/10. We can get these then.

## 2023-06-13 NOTE — Telephone Encounter (Signed)
Elijah Birk, pharmacist with [facility] called and stated the pharmacy needs the providers approval for medication, levothyroxine (SYNTHROID) 100 MCG tablet, manufacturer change. Per Elijah Birk, the manufacture for medication is changing from Amneal to Lupin.    [id number] [facility] Pharmacy # : [phone]  REF # : [id number]

## 2023-06-25 ENCOUNTER — Other Ambulatory Visit: Payer: Self-pay | Admitting: Internal Medicine

## 2023-06-25 DIAGNOSIS — I10 Essential (primary) hypertension: Secondary | ICD-10-CM

## 2023-06-25 DIAGNOSIS — E785 Hyperlipidemia, unspecified: Secondary | ICD-10-CM

## 2023-06-26 NOTE — Telephone Encounter (Signed)
Requested Prescriptions  Pending Prescriptions Disp Refills   pravastatin (PRAVACHOL) 40 MG tablet [Pharmacy Med Name: Pravastatin Sodium 40 MG Oral Tablet] 100 tablet 2    Sig: TAKE 1 TABLET BY MOUTH DAILY     Cardiovascular:  Antilipid - Statins Failed - 06/25/2023 10:14 PM      Failed - Lipid Panel in normal range within the last 12 months    Cholesterol, Total  Date Value Ref Range Status  02/02/2023 127 100 - 199 mg/dL Final   LDL Chol Calc (NIH)  Date Value Ref Range Status  02/02/2023 64 0 - 99 mg/dL Final   HDL  Date Value Ref Range Status  02/02/2023 41 >39 mg/dL Final   Triglycerides  Date Value Ref Range Status  02/02/2023 122 0 - 149 mg/dL Final         Passed - Patient is not pregnant      Passed - Valid encounter within last 12 months    Recent Outpatient Visits           1 month ago Type 2 diabetes mellitus with morbid obesity (HCC)   [place] [facility] Jonah Blue B, MD   4 months ago Type 2 diabetes mellitus with morbid obesity [facility])   [place] [facility] Jonah Blue B, MD   8 months ago Type 2 diabetes mellitus with morbid obesity [facility])   [place] [facility] Jonah Blue B, MD   1 year ago Type 2 diabetes mellitus with morbid obesity [facility])   [place] [facility] Marcine Matar, MD   1 year ago Encounter for [employer] annual wellness exam   [facility] Hopkins, Cornelius Moras, RPH-CPP       Future Appointments             In 2 days Marcine Matar, MD [facility]             lisinopril (ZESTRIL) 20 MG tablet [Pharmacy Med Name: Lisinopril 20 MG Oral Tablet] 100 tablet 1    Sig: TAKE 1 TABLET BY MOUTH DAILY     Cardiovascular:  ACE Inhibitors Failed - 06/25/2023 10:14 PM      Failed - Cr in normal range and within 180 days    Creat  Date Value Ref  Range Status  07/19/2016 1.29 (H) 0.50 - 0.99 mg/dL Final    Comment:      For patients > or = 68 years of age: The upper reference limit for Creatinine is approximately 13% higher for people identified as African-American.      Creatinine, Ser  Date Value Ref Range Status  04/01/2023 1.20 (H) 0.44 - 1.00 mg/dL Final   Creatinine, Urine  Date Value Ref Range Status  03/19/2013 129.5 mg/dL Final         Passed - K in normal range and within 180 days    Potassium  Date Value Ref Range Status  04/01/2023 3.9 3.5 - 5.1 mmol/L Final         Passed - Patient is not pregnant      Passed - Last BP in normal range    BP Readings from Last 1 Encounters:  05/07/23 130/65         Passed - Valid encounter within last 6 months    Recent Outpatient Visits  1 month ago Type 2 diabetes mellitus with morbid obesity [facility])   [place] [facility] Jonah Blue B, MD   4 months ago Type 2 diabetes mellitus with morbid obesity [facility])   [place] [facility] Jonah Blue B, MD   8 months ago Type 2 diabetes mellitus with morbid obesity [facility])   [place] [facility] Jonah Blue B, MD   1 year ago Type 2 diabetes mellitus with morbid obesity [facility])   [place] [facility] Marcine Matar, MD   1 year ago Encounter for [employer] annual wellness exam   [facility] Drucilla Chalet, RPH-CPP       Future Appointments             In 2 days Marcine Matar, MD [facility]

## 2023-06-28 ENCOUNTER — Ambulatory Visit: Payer: 59 | Attending: Internal Medicine | Admitting: Internal Medicine

## 2023-06-28 ENCOUNTER — Encounter: Payer: Self-pay | Admitting: Internal Medicine

## 2023-06-28 DIAGNOSIS — E890 Postprocedural hypothyroidism: Secondary | ICD-10-CM

## 2023-06-28 DIAGNOSIS — Z7984 Long term (current) use of oral hypoglycemic drugs: Secondary | ICD-10-CM | POA: Diagnosis not present

## 2023-06-28 DIAGNOSIS — E1169 Type 2 diabetes mellitus with other specified complication: Secondary | ICD-10-CM | POA: Diagnosis not present

## 2023-06-28 DIAGNOSIS — I10 Essential (primary) hypertension: Secondary | ICD-10-CM | POA: Diagnosis not present

## 2023-06-28 MED ORDER — LEVOTHYROXINE SODIUM 100 MCG PO TABS
100.0000 ug | ORAL_TABLET | Freq: Every day | ORAL | 2 refills | Status: DC
Start: 2023-06-28 — End: 2023-10-09

## 2023-06-28 MED ORDER — LISINOPRIL 20 MG PO TABS
20.0000 mg | ORAL_TABLET | Freq: Every day | ORAL | 1 refills | Status: DC
Start: 2023-06-28 — End: 2023-06-28

## 2023-06-28 MED ORDER — TRULICITY 1.5 MG/0.5ML ~~LOC~~ SOAJ: 1.5000 mg | SUBCUTANEOUS | 4 refills | Status: DC

## 2023-06-28 MED ORDER — LEVOTHYROXINE SODIUM 100 MCG PO TABS
100.0000 ug | ORAL_TABLET | Freq: Every day | ORAL | 2 refills | Status: DC
Start: 2023-06-28 — End: 2023-06-28

## 2023-06-28 MED ORDER — LISINOPRIL 20 MG PO TABS
20.0000 mg | ORAL_TABLET | Freq: Every day | ORAL | 1 refills | Status: DC
Start: 2023-06-28 — End: 2023-12-10

## 2023-06-28 NOTE — Progress Notes (Signed)
Patient ID: Kayla Bean, female    DOB: 01/08/1956  MRN: [id number]  CC: Medication Refill (Trulicity f/u - pt requesting a dosage increase, [facility] [address]/Pain on thumb & finger of R hand /Instructed to bring vax record - already received flu vax.)   Subjective: Kayla Bean is a 67 y.o. female who presents for 6 weeks follow-up since starting Trulicity. Her concerns today include:  Patient with history of diabetes, hypertension, HL, post thyroidectomy hypothyroidism (1973, noncancerous), asthma, CKD stage III, OA knee and obesity, glaucoma, mild intermittent asthma.   Patient was started on Trulicity 0.75 mg once a week on last visit for diabetes and more so to assist with weight loss. Tolerating it well; no significant S.E It has deceased her appetite. Still going to [facility] 5 days a wk for water aerobics.  Down 12 lbs since then.  She is very happy today No low BS.  BS this a.m was 111 Had recent diabetic eye exam at [facility].  Brings in blood pressure reading from this morning at home.  It was 123/65.  Reports compliance with taking amlodipine 10 mg daily and lisinopril 20 mg daily  Hypothyroid: She is on levothyroxine 100 mcg daily.  [facility] will be changing the manufacturer.  last TSH was checked in January of this year.  HM: Reports having received the flu shot, COVID booster, RSV and pneumonia vaccine at [facility] [employer]. Patient Active Problem List   Diagnosis Date Noted   Controlled type 2 diabetes mellitus without complication, without long-term current use of insulin (HCC) 07/08/2020   Class 3 severe obesity with serious comorbidity and body mass index (BMI) of 45.0 to 49.9 in adult (HCC) 10/01/2018   Moderate persistent asthma without complication 07/30/2017   Hyperlipidemia 03/22/2017   Cholelithiasis 12/24/2012   LIPOMA 07/26/2010   CONSTIPATION, CHRONIC  01/18/2009   EUSTACHIAN TUBE DYSFUNCTION, RIGHT 10/16/2008   LOM 09/01/2008   Essential hypertension 08/18/2008   Osteoarthritis of left knee 04/23/2008   Hypothyroidism 10/15/2007     Current Outpatient Medications on File Prior to Visit  Medication Sig Dispense Refill   Accu-Chek Softclix Lancets lancets Use as instructed to check blood sugars once a day. 100 each 12   albuterol (VENTOLIN HFA) 108 (90 Base) MCG/ACT inhaler Inhale 2 puffs into the lungs every 6 (six) hours as needed for wheezing or shortness of breath. 18 g 2   amLODipine (NORVASC) 10 MG tablet Take 1 tablet (10 mg total) by mouth daily. 100 tablet 1   aspirin 81 MG tablet Take 1 tablet (81 mg total) daily by mouth. 90 tablet 0   Blood Glucose Monitoring Suppl (ACCU-CHEK GUIDE) w/Device KIT Use as directed to check blood sugars once a day. 1 kit 0   brimonidine-timolol (COMBIGAN) 0.2-0.5 % ophthalmic solution Place 1 drop into both eyes every 12 (twelve) hours.     celecoxib (CELEBREX) 200 MG capsule Take 200 mg by mouth daily.     cycloSPORINE (RESTASIS) 0.05 % ophthalmic emulsion Place 1 drop into both eyes 2 (two) times daily.     glucose blood (ACCU-CHEK GUIDE) test strip Use as instructed to check blood sugars once a day. 100 each 12   HYDROcodone-acetaminophen (NORCO/VICODIN) 5-325 MG tablet Take 2 tablets by mouth every 4 (four) hours as needed. 10 tablet 0   Lancet Devices (ACCU-CHEK SOFTCLIX) lancets Use as instructed for 3 times daily testing of  blood glucose 1 each 0   Lancets Misc. (ACCU-CHEK FASTCLIX LANCET) KIT Use as directed 1 kit 12   latanoprost (XALATAN) 0.005 % ophthalmic solution Place 1 drop into both eyes at bedtime.      pravastatin (PRAVACHOL) 40 MG tablet TAKE 1 TABLET BY MOUTH DAILY 100 tablet 2   triamcinolone cream (KENALOG) 0.1 % Apply 1 Application topically 2 (two) times daily. Do not put on face 30 g 0   No current facility-administered medications on file prior to visit.    Allergies   Allergen Reactions   Farxiga [Dapagliflozin] Other (See Comments)    Recurrent vaginal yeast   Latex Hives and Other (See Comments)   Metformin     Other reaction(s): hair loss   Metformin And Related     Hair loss   Penicillin G Other (See Comments)   Penicillins Hives   Warfarin Sodium Hives    REACTION: Got rash when took one pill of coumadin  related to knee replacement surgery in 2007.   Warfarin Sodium Other (See Comments)   Iodinated Contrast Media Other (See Comments)    PT STATES SHE HAD AN ALLERGIC REACTION OF BLISTERS ON HANDS AND FEET 2 DAYS AFTER CT SCAN W/ IV CONTRAST INJECTION, DR. MANSELL SUGGESTS 13 HR PREP//A.C.    Social History   Socioeconomic History   Marital status: Single    Spouse name: Not on file   Number of children: Not on file   Years of education: Not on file   Highest education level: Associate degree: occupational, Scientist, product/process development, or vocational program  Occupational History   Not on file  Tobacco Use   Smoking status: Former    Current packs/day: 0.00    Types: Cigarettes    Quit date: 09/18/2001    Years since quitting: 21.7   Smokeless tobacco: Never  Substance and Sexual Activity   Alcohol use: Yes   Drug use: No   Sexual activity: Not Currently  Other Topics Concern   Not on file  Social History Narrative   Not on file   Social Determinants of Health   Financial Resource Strain: Low Risk  (06/25/2023)   Overall Financial Resource Strain (CARDIA)    Difficulty of Paying Living Expenses: Not hard at all  Food Insecurity: No Food Insecurity (06/25/2023)   Hunger Vital Sign    Worried About Running Out of Food in the Last Year: Never true    Ran Out of Food in the Last Year: Never true  Transportation Needs: No Transportation Needs (06/25/2023)   PRAPARE - Administrator, Civil Service (Medical): No    Lack of Transportation (Non-Medical): No  Physical Activity: Sufficiently Active (06/25/2023)   Exercise Vital Sign    Days  of Exercise per Week: 5 days    Minutes of Exercise per Session: 150+ min  Stress: No Stress Concern Present (06/25/2023)   [employer] of Occupational Health - Occupational Stress Questionnaire    Feeling of Stress : Not at all  Social Connections: Moderately Isolated (06/25/2023)   Social Connection and Isolation Panel [NHANES]    Frequency of Communication with Friends and Family: More than three times a week    Frequency of Social Gatherings with Friends and Family: Twice a week    Attends Religious Services: 1 to 4 times per year    Active Member of [employer] or Organizations: No    Attends Banker Meetings: Never    Marital Status: Never married  Intimate Partner Violence: Not At Risk (02/27/2023)   Humiliation, Afraid, Rape, and Kick questionnaire    Fear of Current or Ex-Partner: No    Emotionally Abused: No    Physically Abused: No    Sexually Abused: No    Family History  Problem Relation Age of Onset   Asthma Other    Cancer Other    Hypertension Other    Diabetes Other     Past Surgical History:  Procedure Laterality Date   ABDOMINAL HYSTERECTOMY     CHOLECYSTECTOMY N/A 02/24/2019   Procedure: LAPAROSCOPIC CHOLECYSTECTOMY WITH INTRAOPERATIVE CHOLANGIOGRAM;  Surgeon: Manus Rudd, MD;  Location: [facility] OR;  Service: General;  Laterality: N/A;   KNEE SURGERY     left   THYROIDECTOMY     TOTAL KNEE ARTHROPLASTY     right knee    ROS: Review of Systems Negative except as stated above  PHYSICAL EXAM: BP 131/77 (BP Location: Left Arm, Patient Position: Sitting, Cuff Size: Normal)   Pulse (!) 55   Temp 98.2 F (36.8 C) (Oral)   Ht 5\' 2"  (1.575 m)   Wt 223 lb (101.2 kg)   SpO2 99%   BMI 40.79 kg/m   Wt Readings from Last 3 Encounters:  06/28/23 223 lb (101.2 kg)  05/07/23 235 lb (106.6 kg)  04/01/23 236 lb (107 kg)    Physical Exam  General appearance - alert, well appearing, and in no distress Mental status - normal mood, behavior, speech,  dress, motor activity, and thought processes Chest - clear to auscultation, no wheezes, rales or rhonchi, symmetric air entry Heart - normal rate, regular rhythm, normal S1, S2, no murmurs, rubs, clicks or gallops Extremities - peripheral pulses normal, no pedal edema, no clubbing or cyanosis      Latest Ref Rng & Units 04/01/2023   10:52 AM 02/02/2023    5:12 PM 10/09/2022   12:09 PM  CMP  Glucose 70 - 99 mg/dL 161   096   BUN 8 - 23 mg/dL 13   14   Creatinine 0.45 - 1.00 mg/dL 4.09   8.11   Sodium 914 - 145 mmol/L 139   140   Potassium 3.5 - 5.1 mmol/L 3.9   4.4   Chloride 98 - 111 mmol/L 102   101   CO2 22 - 32 mmol/L 23   24   Calcium 8.9 - 10.3 mg/dL 8.9   9.3   Total Protein 6.0 - 8.5 g/dL  7.9    Total Bilirubin 0.0 - 1.2 mg/dL  0.4    Alkaline Phos 44 - 121 IU/L  131    AST 0 - 40 IU/L  14    ALT 0 - 32 IU/L  11     Lipid Panel     Component Value Date/Time   CHOL 127 02/02/2023 1712   TRIG 122 02/02/2023 1712   HDL 41 02/02/2023 1712   CHOLHDL 3.1 02/02/2023 1712   CHOLHDL 3.6 03/14/2016 1238   VLDL 12 03/14/2016 1238   LDLCALC 64 02/02/2023 1712    CBC    Component Value Date/Time   WBC 8.9 04/01/2023 1052   RBC 4.37 04/01/2023 1052   HGB 13.7 04/01/2023 1052   HGB 13.2 01/31/2022 1158   HCT 41.2 04/01/2023 1052   HCT 38.4 01/31/2022 1158   PLT 211 04/01/2023 1052   PLT 273 01/31/2022 1158   MCV 94.3 04/01/2023 1052   MCV 90 01/31/2022 1158   MCH 31.4 04/01/2023 1052  MCHC 33.3 04/01/2023 1052   RDW 12.7 04/01/2023 1052   RDW 12.8 01/31/2022 1158   LYMPHSABS 1.4 04/01/2023 1052   MONOABS 0.4 04/01/2023 1052   EOSABS 0.4 04/01/2023 1052   BASOSABS 0.1 04/01/2023 1052    ASSESSMENT AND PLAN:  1. Type 2 diabetes mellitus with morbid obesity (HCC) Commended her on weight loss so far. Encouraged her to continue healthy eating habits and regular exercise. We discussed increasing the dose of the Trulicity to 1.5 mg once a week.  She is agreeable to  this.  Went over with her again possible side effects of the medication.  She should stop the medicine and let me know if she develops any abdominal pain, vomiting, severe diarrhea or constipation. - Dulaglutide (TRULICITY) 1.5 MG/0.5ML SOPN; Inject 1.5 mg into the skin once a week.  Dispense: 2 mL; Refill: 4  2. Essential hypertension Close to goal.  Continue current medications - lisinopril (ZESTRIL) 20 MG tablet; Take 1 tablet (20 mg total) by mouth daily.  Dispense: 100 tablet; Refill: 1  3. Postoperative hypothyroidism Since her pharmacy will be changing to a different manufacturer of levothyroxine, I advised that once she receive a new bottle/her next refill, she should return to the lab to have TSH checked 1 month after.  In the meantime we do need to check her TSH today as well since it has been 9 months since it was last checked. - TSH - levothyroxine (SYNTHROID) 100 MCG tablet; Take 1 tablet (100 mcg total) by mouth daily before breakfast.  Dispense: 100 tablet; Refill: 2  Patient was given the opportunity to ask questions.  Patient verbalized understanding of the plan and was able to repeat key elements of the plan.   This documentation was completed using Paediatric nurse.  Any transcriptional errors are unintentional.  Orders Placed This Encounter  Procedures   TSH     Requested Prescriptions   Signed Prescriptions Disp Refills   Dulaglutide (TRULICITY) 1.5 MG/0.5ML SOPN 2 mL 4    Sig: Inject 1.5 mg into the skin once a week.   levothyroxine (SYNTHROID) 100 MCG tablet 100 tablet 2    Sig: Take 1 tablet (100 mcg total) by mouth daily before breakfast.   lisinopril (ZESTRIL) 20 MG tablet 100 tablet 1    Sig: Take 1 tablet (20 mg total) by mouth daily.    Return in about 4 months (around 10/29/2023).  Jonah Blue, MD, FACP

## 2023-06-28 NOTE — Patient Instructions (Signed)
We will check your thyroid level today. Once you start your next bottle of levothyroxine from [facility], please return to the lab to have thyroid level checked again after you have been on it for about 1 month.  Increase Trulicity to 1.5 mg once a week.  Please let me know if you have any significant side effects.

## 2023-06-29 DIAGNOSIS — G5602 Carpal tunnel syndrome, left upper limb: Secondary | ICD-10-CM | POA: Diagnosis not present

## 2023-06-29 DIAGNOSIS — M1712 Unilateral primary osteoarthritis, left knee: Secondary | ICD-10-CM | POA: Diagnosis not present

## 2023-06-29 LAB — TSH: TSH: 1 u[IU]/mL (ref 0.450–4.500)

## 2023-07-16 ENCOUNTER — Other Ambulatory Visit: Payer: Self-pay | Admitting: Internal Medicine

## 2023-07-18 DIAGNOSIS — M65311 Trigger thumb, right thumb: Secondary | ICD-10-CM | POA: Diagnosis not present

## 2023-09-09 ENCOUNTER — Other Ambulatory Visit: Payer: Self-pay | Admitting: Internal Medicine

## 2023-09-09 DIAGNOSIS — I10 Essential (primary) hypertension: Secondary | ICD-10-CM

## 2023-10-09 ENCOUNTER — Other Ambulatory Visit: Payer: Self-pay | Admitting: Internal Medicine

## 2023-10-09 DIAGNOSIS — E890 Postprocedural hypothyroidism: Secondary | ICD-10-CM

## 2023-10-09 MED ORDER — LEVOTHYROXINE SODIUM 100 MCG PO TABS
100.0000 ug | ORAL_TABLET | Freq: Every day | ORAL | 2 refills | Status: DC
Start: 2023-10-09 — End: 2024-08-11

## 2023-10-09 NOTE — Telephone Encounter (Signed)
Copied from CRM [id number]. Topic: General - Other >> Oct 09, 2023  8:07 AM Everette C wrote: Reason for CRM: Medication Refill - Most Recent Primary Care Visit:  Provider: Marcine Matar Department: [facility] Visit Type: OFFICE VISIT Date: 06/28/2023  Medication: levothyroxine (SYNTHROID) 100 MCG tablet [[id number]]  Has the patient contacted their pharmacy? Yes (Agent: If no, request that the patient contact the pharmacy for the refill. If patient does not wish to contact the pharmacy document the reason why and proceed with request.) (Agent: If yes, when and what did the pharmacy advise?)  Is this the correct pharmacy for this prescription? Yes If no, delete pharmacy and type the correct one.  This is the patient's preferred pharmacy:  [id number] - [place] ([place]), [place] - [address] [place] ([place]) [place] [address] Phone: [phone] Fax: [phone]  Has the prescription been filled recently? Yes  Is the patient out of the medication? Yes  Has the patient been seen for an appointment in the last year OR does the patient have an upcoming appointment? Yes  Can we respond through MyChart? No  Agent: Please be advised that Rx refills may take up to 3 business days. We ask that you follow-up with your pharmacy.

## 2023-10-09 NOTE — Telephone Encounter (Signed)
Requested Prescriptions  Pending Prescriptions Disp Refills   levothyroxine (SYNTHROID) 100 MCG tablet 100 tablet 2    Sig: Take 1 tablet (100 mcg total) by mouth daily before breakfast.     Endocrinology:  Hypothyroid Agents Passed - 10/09/2023 12:56 PM      Passed - TSH in normal range and within 360 days    TSH  Date Value Ref Range Status  06/28/2023 1.000 0.450 - 4.500 uIU/mL Final         Passed - Valid encounter within last 12 months    Recent Outpatient Visits           3 months ago Type 2 diabetes mellitus with morbid obesity (HCC)   [facility] [place]. [facility] Jonah Blue B, MD   5 months ago Type 2 diabetes mellitus with morbid obesity [facility])   [facility] [place]. [facility] Jonah Blue B, MD   8 months ago Type 2 diabetes mellitus with morbid obesity [facility])   [facility] [place]. [facility] Jonah Blue B, MD   1 year ago Type 2 diabetes mellitus with morbid obesity [facility])   [facility] Of [place]. [facility] Jonah Blue B, MD   1 year ago Type 2 diabetes mellitus with morbid obesity [facility])   [facility] - [facility] Of [place]. [facility] Marcine Matar, MD

## 2023-10-31 ENCOUNTER — Encounter: Payer: Self-pay | Admitting: Physician Assistant

## 2023-10-31 ENCOUNTER — Ambulatory Visit: Payer: 59 | Admitting: Family Medicine

## 2023-10-31 ENCOUNTER — Ambulatory Visit: Payer: 59 | Admitting: Physician Assistant

## 2023-10-31 VITALS — BP 136/82 | HR 78 | Ht 62.0 in | Wt 222.0 lb

## 2023-10-31 DIAGNOSIS — E66813 Obesity, class 3: Secondary | ICD-10-CM | POA: Diagnosis not present

## 2023-10-31 DIAGNOSIS — E1169 Type 2 diabetes mellitus with other specified complication: Secondary | ICD-10-CM

## 2023-10-31 DIAGNOSIS — Z6841 Body Mass Index (BMI) 40.0 and over, adult: Secondary | ICD-10-CM

## 2023-10-31 DIAGNOSIS — Z7985 Long-term (current) use of injectable non-insulin antidiabetic drugs: Secondary | ICD-10-CM

## 2023-10-31 DIAGNOSIS — I10 Essential (primary) hypertension: Secondary | ICD-10-CM

## 2023-10-31 DIAGNOSIS — E119 Type 2 diabetes mellitus without complications: Secondary | ICD-10-CM

## 2023-10-31 LAB — POCT GLYCOSYLATED HEMOGLOBIN (HGB A1C)
HbA1c, POC (controlled diabetic range): 5.6 % (ref 0.0–7.0)
Hemoglobin A1C: 5.6 % (ref 4.0–5.6)

## 2023-10-31 MED ORDER — ACCU-CHEK SOFTCLIX LANCETS MISC
12 refills | Status: DC
Start: 2023-10-31 — End: 2023-12-24

## 2023-10-31 MED ORDER — ACCU-CHEK AVIVA PLUS VI STRP: 1.0000 | ORAL_STRIP | 12 refills | Status: DC | PRN

## 2023-10-31 MED ORDER — ACCU-CHEK AVIVA PLUS W/DEVICE KIT
1.0000 [IU] | PACK | Freq: Once | 0 refills | Status: AC
Start: 2023-10-31 — End: 2023-10-31

## 2023-10-31 MED ORDER — TRULICITY 1.5 MG/0.5ML ~~LOC~~ SOAJ
1.5000 mg | SUBCUTANEOUS | 4 refills | Status: DC
Start: 2023-10-31 — End: 2024-03-26

## 2023-10-31 NOTE — Patient Instructions (Signed)
VISIT SUMMARY:  During today's visit, we discussed your ongoing management of diabetes, glaucoma, and hypertension. You have made excellent progress with your diabetes through lifestyle changes and medication, resulting in significant weight loss and improved blood sugar levels. Your hypertension is also well controlled with your current medication. We have refilled your prescriptions and ordered necessary supplies and tests to continue monitoring your health.  YOUR PLAN:  -TYPE 2 DIABETES MELLITUS: Type 2 diabetes is a condition where your body does not use insulin properly, leading to high blood sugar levels. Your diabetes is well controlled with an A1c of 5.6, down from 6.6 five months ago, thanks to your lifestyle changes and Trulicity. We have refilled your Trulicity prescription and ordered Accu-Chek test strips and lancets for blood sugar monitoring. We will also check your kidney and liver function today.  -HYPERTENSION: Hypertension, or high blood pressure, is a condition where the force of the blood against your artery walls is too high. Your blood pressure is well controlled with Lisinopril. Please continue taking Lisinopril as prescribed.  -GENERAL HEALTH MAINTENANCE: We have scheduled a follow-up appointment with Dr. Laural Benes on April 7th at 3:10pm. Please continue with your current healthy lifestyle choices, including regular exercise and a balanced diet, and monitor your blood pressure at home.  INSTRUCTIONS:  Please follow up with Dr. Laural Benes on April 7th at 3:10pm for your next appointment. Continue taking your medications as prescribed and monitor your blood pressure regularly. We will check your kidney and liver function today.

## 2023-10-31 NOTE — Progress Notes (Signed)
Established Patient Office Visit  Subjective   Patient ID: Kayla Bean, female    DOB: August 31, 1956  Age: 68 y.o. MRN: [id number]  Chief Complaint  Patient presents with   Diabetes   Discussed the use of AI scribe software for clinical note transcription with the patient, who gave verbal consent to proceed.  History of Present Illness       The patient, with a history of diabetes, glaucoma, and hypertension, presents for a medication refill. She reports that she has one injection of Trulicity left and is due to see her primary care provider soon. She has been managing her diabetes with Trulicity and lifestyle modifications, including regular exercise at the local [employer] and dietary changes. She has significantly reduced her intake of bread, pasta, and rice, and has increased her consumption of salads, vegetables, and fruits. She also reports drinking a lot of water and using olive oil and spices instead of salt for cooking. These changes have resulted in a weight loss of 15 pounds over a 6 months period.  The patient also has a history of glaucoma and has had surgery on both eyes over ten years ago. She regularly visits her eye doctor, with the most recent visit being four months ago. She also reports having high blood pressure, which she monitors at home. She takes all her medications, including lisinopril, every morning.   Past Medical History:  Diagnosis Date   Arthritis    Asthma    Diabetes mellitus    Heart murmur    Hemorrhoids    Hypertension    POAG (primary open-angle glaucoma)    BL - followed by Dr. Lavona Mound   Thyroid disease    hypothyroidism   Social History   Socioeconomic History   Marital status: Single    Spouse name: Not on file   Number of children: Not on file   Years of education: Not on file   Highest education level: Associate degree: occupational, Scientist, product/process development, or vocational program  Occupational History   Not on file  Tobacco Use   Smoking status:  Former    Current packs/day: 0.00    Types: Cigarettes    Quit date: 09/18/2001    Years since quitting: 22.1   Smokeless tobacco: Never  Substance and Sexual Activity   Alcohol use: Yes   Drug use: No   Sexual activity: Not Currently  Other Topics Concern   Not on file  Social History Narrative   Not on file   Social Drivers of Health   Financial Resource Strain: Low Risk  (06/25/2023)   Overall Financial Resource Strain (CARDIA)    Difficulty of Paying Living Expenses: Not hard at all  Food Insecurity: No Food Insecurity (06/25/2023)   Hunger Vital Sign    Worried About Running Out of Food in the Last Year: Never true    Ran Out of Food in the Last Year: Never true  Transportation Needs: No Transportation Needs (06/25/2023)   PRAPARE - Administrator, Civil Service (Medical): No    Lack of Transportation (Non-Medical): No  Physical Activity: Sufficiently Active (06/25/2023)   Exercise Vital Sign    Days of Exercise per Week: 5 days    Minutes of Exercise per Session: 150+ min  Stress: No Stress Concern Present (06/25/2023)   [employer] of Occupational Health - Occupational Stress Questionnaire    Feeling of Stress : Not at all  Social Connections: Moderately Isolated (06/25/2023)   Social Connection and  Isolation Panel [NHANES]    Frequency of Communication with Friends and Family: More than three times a week    Frequency of Social Gatherings with Friends and Family: Twice a week    Attends Religious Services: 1 to 4 times per year    Active Member of [employer] or Organizations: No    Attends Banker Meetings: Never    Marital Status: Never married  Intimate Partner Violence: Not At Risk (02/27/2023)   Humiliation, Afraid, Rape, and Kick questionnaire    Fear of Current or Ex-Partner: No    Emotionally Abused: No    Physically Abused: No    Sexually Abused: No   Family History  Problem Relation Age of Onset   Asthma Other    Cancer Other     Hypertension Other    Diabetes Other    Allergies  Allergen Reactions   Farxiga [Dapagliflozin] Other (See Comments)    Recurrent vaginal yeast   Latex Hives and Other (See Comments)   Metformin     Other reaction(s): hair loss   Metformin And Related     Hair loss   Penicillin G Other (See Comments)   Penicillins Hives   Warfarin Sodium Other (See Comments)   Warfarin Sodium Other (See Comments)   Iodinated Contrast Media Other (See Comments)    PT STATES SHE HAD AN ALLERGIC REACTION OF BLISTERS ON HANDS AND FEET 2 DAYS AFTER CT SCAN W/ IV CONTRAST INJECTION, DR. MANSELL SUGGESTS 13 HR PREP//A.C.    Review of Systems  Constitutional: Negative.   HENT: Negative.    Eyes: Negative.   Respiratory: Negative.    Cardiovascular: Negative.   Gastrointestinal: Negative.   Genitourinary: Negative.   Musculoskeletal: Negative.   Skin: Negative.   Neurological: Negative.   Endo/Heme/Allergies: Negative.   Psychiatric/Behavioral: Negative.        Objective:     BP 136/82 (BP Location: Left Arm, Patient Position: Sitting, Cuff Size: Large)   Pulse 78   Ht 5\' 2"  (1.575 m)   Wt 222 lb (100.7 kg)   SpO2 98%   BMI 40.60 kg/m  BP Readings from Last 3 Encounters:  10/31/23 136/82  06/28/23 131/77  05/07/23 130/65   Wt Readings from Last 3 Encounters:  10/31/23 222 lb (100.7 kg)  06/28/23 223 lb (101.2 kg)  05/07/23 235 lb (106.6 kg)    Physical Exam Constitutional:      Appearance: Normal appearance. She is obese.  HENT:     Head: Normocephalic and atraumatic.     Right Ear: External ear normal.     Left Ear: External ear normal.     Nose: Nose normal.     Mouth/Throat:     Mouth: Mucous membranes are moist.  Eyes:     Extraocular Movements: Extraocular movements intact.  Cardiovascular:     Rate and Rhythm: Normal rate and regular rhythm.     Pulses: Normal pulses.     Heart sounds: Normal heart sounds. No murmur heard.    No friction rub. No gallop.   Pulmonary:     Effort: Pulmonary effort is normal.     Breath sounds: Normal breath sounds.  Abdominal:     General: Bowel sounds are normal. There is no distension.     Palpations: Abdomen is soft. There is no mass.     Tenderness: There is no abdominal tenderness. There is no right CVA tenderness, left CVA tenderness, guarding or rebound.     Hernia: No  hernia is present.  Musculoskeletal:        General: No swelling or tenderness.     Right lower leg: No edema.     Left lower leg: No edema.  Skin:    General: Skin is warm and dry.  Neurological:     General: No focal deficit present.     Mental Status: She is alert and oriented to person, place, and time.  Psychiatric:        Mood and Affect: Mood normal.        Behavior: Behavior normal.        Thought Content: Thought content normal.        Judgment: Judgment normal.        Assessment & Plan:   Problem List Items Addressed This Visit       Cardiovascular and Mediastinum   Essential hypertension - Primary (Chronic)     Endocrine   Controlled type 2 diabetes mellitus without complication, without long-term current use of insulin (HCC)   Relevant Medications   Dulaglutide (TRULICITY) 1.5 MG/0.5ML SOAJ   Blood Glucose Monitoring Suppl (ACCU-CHEK AVIVA PLUS) w/Device KIT   glucose blood (ACCU-CHEK AVIVA PLUS) test strip   Accu-Chek Softclix Lancets lancets   Other Visit Diagnoses       Type 2 diabetes mellitus with morbid obesity (HCC)       Relevant Medications   Dulaglutide (TRULICITY) 1.5 MG/0.5ML SOAJ   Blood Glucose Monitoring Suppl (ACCU-CHEK AVIVA PLUS) w/Device KIT   glucose blood (ACCU-CHEK AVIVA PLUS) test strip   Accu-Chek Softclix Lancets lancets   Other Relevant Orders   POCT glycosylated hemoglobin (Hb A1C)   CBC with Differential/Platelet   Comp. Metabolic Panel (12)      1. Type 2 diabetes mellitus with morbid obesity (HCC) A1C improved to 5.6.  Continue current regimen. - POCT  glycosylated hemoglobin (Hb A1C) - Dulaglutide (TRULICITY) 1.5 MG/0.5ML SOAJ; Inject 1.5 mg into the skin once a week.  Dispense: 2 mL; Refill: 4 - CBC with Differential/Platelet - Comp. Metabolic Panel (12) - Blood Glucose Monitoring Suppl (ACCU-CHEK AVIVA PLUS) w/Device KIT; 1 Units by Does not apply route once for 1 dose.  Dispense: 1 kit; Refill: 0 - glucose blood (ACCU-CHEK AVIVA PLUS) test strip; 1 each by Other route as needed. Use as instructed  Dispense: 100 each; Refill: 12 - Accu-Chek Softclix Lancets lancets; Use as instructed  Dispense: 100 each; Refill: 12  2. Essential hypertension (Primary) No refills needed today   3. Controlled type 2 diabetes mellitus without complication, without long-term current use of insulin (HCC)   I have reviewed the patient's medical history (PMH, PSH, Social History, Family History, Medications, and allergies) , and have been updated if relevant. I spent 30 minutes reviewing chart and  face to face time with patient.   Return in about 8 weeks (around 12/24/2023) for [facility].    Kasandra Knudsen Mayers, PA-C

## 2023-11-02 LAB — CBC WITH DIFFERENTIAL/PLATELET
Basophils Absolute: 0 10*3/uL (ref 0.0–0.2)
Basos: 1 %
EOS (ABSOLUTE): 0.3 10*3/uL (ref 0.0–0.4)
Eos: 6 %
Hematocrit: 44.6 % (ref 34.0–46.6)
Hemoglobin: 14.6 g/dL (ref 11.1–15.9)
Immature Grans (Abs): 0 10*3/uL (ref 0.0–0.1)
Immature Granulocytes: 1 %
Lymphocytes Absolute: 1.5 10*3/uL (ref 0.7–3.1)
Lymphs: 32 %
MCH: 30.8 pg (ref 26.6–33.0)
MCHC: 32.7 g/dL (ref 31.5–35.7)
MCV: 94 fL (ref 79–97)
Monocytes Absolute: 0.3 10*3/uL (ref 0.1–0.9)
Monocytes: 6 %
Neutrophils Absolute: 2.6 10*3/uL (ref 1.4–7.0)
Neutrophils: 54 %
Platelets: 218 10*3/uL (ref 150–450)
RBC: 4.74 x10E6/uL (ref 3.77–5.28)
RDW: 13 % (ref 11.7–15.4)
WBC: 4.7 10*3/uL (ref 3.4–10.8)

## 2023-11-02 LAB — COMP. METABOLIC PANEL (12)
AST: 15 [IU]/L (ref 0–40)
Albumin: 4.3 g/dL (ref 3.9–4.9)
Alkaline Phosphatase: 94 [IU]/L (ref 44–121)
BUN/Creatinine Ratio: 16 (ref 12–28)
BUN: 14 mg/dL (ref 8–27)
Bilirubin Total: 0.4 mg/dL (ref 0.0–1.2)
Calcium: 9.4 mg/dL (ref 8.7–10.3)
Chloride: 103 mmol/L (ref 96–106)
Creatinine, Ser: 0.88 mg/dL (ref 0.57–1.00)
Globulin, Total: 3.6 g/dL (ref 1.5–4.5)
Glucose: 78 mg/dL (ref 70–99)
Potassium: 3.9 mmol/L (ref 3.5–5.2)
Sodium: 140 mmol/L (ref 134–144)
Total Protein: 7.9 g/dL (ref 6.0–8.5)
eGFR: 71 mL/min/{1.73_m2} (ref >59)

## 2023-11-05 ENCOUNTER — Encounter: Payer: Self-pay | Admitting: Internal Medicine

## 2023-11-22 ENCOUNTER — Other Ambulatory Visit: Payer: Self-pay | Admitting: Internal Medicine

## 2023-11-22 DIAGNOSIS — Z1231 Encounter for screening mammogram for malignant neoplasm of breast: Secondary | ICD-10-CM

## 2023-12-05 ENCOUNTER — Ambulatory Visit
Admission: RE | Admit: 2023-12-05 | Discharge: 2023-12-05 | Disposition: A | Discharge status: Home / Self Care | Source: Ambulatory Visit | Attending: Internal Medicine | Admitting: Internal Medicine

## 2023-12-05 DIAGNOSIS — Z1231 Encounter for screening mammogram for malignant neoplasm of breast: Secondary | ICD-10-CM

## 2023-12-07 ENCOUNTER — Other Ambulatory Visit: Payer: Self-pay | Admitting: Internal Medicine

## 2023-12-07 DIAGNOSIS — I10 Essential (primary) hypertension: Secondary | ICD-10-CM

## 2023-12-24 ENCOUNTER — Ambulatory Visit: Payer: 59 | Attending: Internal Medicine | Admitting: Internal Medicine

## 2023-12-24 ENCOUNTER — Encounter: Payer: Self-pay | Admitting: Internal Medicine

## 2023-12-24 VITALS — BP 110/74 | HR 73 | Temp 98.3°F | Ht 62.0 in | Wt 209.0 lb

## 2023-12-24 DIAGNOSIS — I152 Hypertension secondary to endocrine disorders: Secondary | ICD-10-CM

## 2023-12-24 DIAGNOSIS — Z6838 Body mass index (BMI) 38.0-38.9, adult: Secondary | ICD-10-CM

## 2023-12-24 DIAGNOSIS — I10 Essential (primary) hypertension: Secondary | ICD-10-CM | POA: Diagnosis not present

## 2023-12-24 DIAGNOSIS — E785 Hyperlipidemia, unspecified: Secondary | ICD-10-CM

## 2023-12-24 DIAGNOSIS — E119 Type 2 diabetes mellitus without complications: Secondary | ICD-10-CM

## 2023-12-24 DIAGNOSIS — E1169 Type 2 diabetes mellitus with other specified complication: Secondary | ICD-10-CM

## 2023-12-24 DIAGNOSIS — E890 Postprocedural hypothyroidism: Secondary | ICD-10-CM

## 2023-12-24 DIAGNOSIS — E669 Obesity, unspecified: Secondary | ICD-10-CM

## 2023-12-24 DIAGNOSIS — Z7985 Long-term (current) use of injectable non-insulin antidiabetic drugs: Secondary | ICD-10-CM

## 2023-12-24 MED ORDER — ACCU-CHEK GUIDE W/DEVICE KIT
PACK | 0 refills | Status: AC
Start: 2023-12-24 — End: ?

## 2023-12-24 MED ORDER — ACCU-CHEK SOFTCLIX LANCETS MISC: 12 refills | Status: AC

## 2023-12-24 MED ORDER — LISINOPRIL 20 MG PO TABS: 20.0000 mg | ORAL_TABLET | Freq: Every day | ORAL | 1 refills | Status: DC

## 2023-12-24 MED ORDER — ACCU-CHEK GUIDE TEST VI STRP: ORAL_STRIP | 12 refills | Status: AC

## 2023-12-24 NOTE — Progress Notes (Signed)
 Patient ID: Kayla Bean, female    DOB: [birth year]  MRN: [id number]  CC: Diabetes (DM & hypothyroidism. Herold Harms BS monitor Dallie Piles received flu vax)   Subjective: Kayla Bean is a 68 y.o. female who presents for chronic ds management. Her concerns today include:  Patient with history of diabetes, hypertension, HL, post thyroidectomy hypothyroidism (1973, noncancerous), asthma, CKD stage III, OA knee and obesity, glaucoma, mild intermittent asthma.   Discussed the use of AI scribe software for clinical note transcription with the patient, who gave verbal consent to proceed.  History of Present Illness The patient, with a history of diabetes, hypertension, thyroid disease, and kidney disease, presents for a routine follow-up.  DM/Obesity:    She reports compliance with her current diabetes regimen of Trulicity 1.5mg  once weekly. She has noticed a significant improvement in her diabetes control since starting Trulicity, with her most recent HbA1c in February being 5.6%, down from 6.6% in August of the previous year and in the 7s prior to that. She expresses interest in increasing her Trulicity dosage, but does not provide a clear reason for this request. She reports checking her blood sugars twice weekly, with a recent reading of 93mg /dL. -She reports significant decrease appetite with Trulicity.  Did not eat anything yesterday because of no appetite.  She has not had any vomiting or abdominal pain.  She is pleased with her weight loss.  She has lost 30 pounds since June of last year. -She has not been going to swimming at the [facility] during the winter months but started back 3 weeks ago.  She goes 4 times a week.  HTN: Reports compliance with Norvasc 10 mg daily and lisinopril 20 mg daily.  She limits salt in the foods.  No chest pains or shortness of breath.  Previously had CKD 3 AA but recent GFR had improved from the 50s to 71.  She checks her blood pressure regularly and  reports good readings.  Reading this morning was 125/69 at home. Taking and tolerating pravastatin 40 mg daily for cholesterol.  Hypothyroidism: She continues to take the levothyroxine 100 mcg daily.    Patient Active Problem List   Diagnosis Date Noted   Long-term current use of injectable noninsulin antidiabetic medication 10/31/2023   Controlled type 2 diabetes mellitus without complication, without long-term current use of insulin (HCC) 07/08/2020   Class 3 severe obesity due to excess calories with serious comorbidity and body mass index (BMI) of 40.0 to 44.9 in adult (HCC) 10/01/2018   Moderate persistent asthma without complication 07/30/2017   Hyperlipidemia 03/22/2017   Cholelithiasis 12/24/2012   LIPOMA 07/26/2010   CONSTIPATION, CHRONIC 01/18/2009   EUSTACHIAN TUBE DYSFUNCTION, RIGHT 10/16/2008   LOM 09/01/2008   Essential hypertension 08/18/2008   Osteoarthritis of left knee 04/23/2008   Hypothyroidism 10/15/2007     Current Outpatient Medications on File Prior to Visit  Medication Sig Dispense Refill   Accu-Chek Softclix Lancets lancets Use as instructed 100 each 12   albuterol (VENTOLIN HFA) 108 (90 Base) MCG/ACT inhaler Inhale 2 puffs into the lungs every 6 (six) hours as needed for wheezing or shortness of breath. 18 g 2   amLODipine (NORVASC) 10 MG tablet TAKE 1 TABLET BY MOUTH DAILY 100 tablet 2   aspirin 81 MG tablet Take 1 tablet (81 mg total) daily by mouth. 90 tablet 0   brimonidine-timolol (COMBIGAN) 0.2-0.5 % ophthalmic solution Place 1 drop into both eyes every 12 (twelve) hours.     Dulaglutide (  TRULICITY) 1.5 MG/0.5ML SOAJ Inject 1.5 mg into the skin once a week. 2 mL 4   glucose blood (ACCU-CHEK AVIVA PLUS) test strip 1 each by Other route as needed. Use as instructed 100 each 12   glucose blood (ACCU-CHEK GUIDE) test strip Use as instructed to check blood sugars once a day. 100 each 12   Lancet Devices (ACCU-CHEK SOFTCLIX) lancets Use as instructed for 3  times daily testing of blood glucose 1 each 0   Lancets Misc. (ACCU-CHEK FASTCLIX LANCET) KIT Use as directed 1 kit 12   latanoprost (XALATAN) 0.005 % ophthalmic solution Place 1 drop into both eyes at bedtime.      levothyroxine (SYNTHROID) 100 MCG tablet Take 1 tablet (100 mcg total) by mouth daily before breakfast. 100 tablet 2   lisinopril (ZESTRIL) 20 MG tablet TAKE 1 TABLET BY MOUTH DAILY 100 tablet 0   pravastatin (PRAVACHOL) 40 MG tablet TAKE 1 TABLET BY MOUTH DAILY 100 tablet 2   celecoxib (CELEBREX) 200 MG capsule Take 200 mg by mouth daily. (Patient not taking: Reported on 12/24/2023)     cycloSPORINE (RESTASIS) 0.05 % ophthalmic emulsion Place 1 drop into both eyes 2 (two) times daily. (Patient not taking: Reported on 12/24/2023)     HYDROcodone-acetaminophen (NORCO/VICODIN) 5-325 MG tablet Take 2 tablets by mouth every 4 (four) hours as needed. (Patient not taking: Reported on 12/24/2023) 10 tablet 0   triamcinolone cream (KENALOG) 0.1 % Apply 1 Application topically 2 (two) times daily. Do not put on face (Patient not taking: Reported on 12/24/2023) 30 g 0   No current facility-administered medications on file prior to visit.    Allergies  Allergen Reactions   Farxiga [Dapagliflozin] Other (See Comments)    Recurrent vaginal yeast   Latex Hives and Other (See Comments)   Metformin     Other reaction(s): hair loss   Metformin And Related     Hair loss   Penicillin G Other (See Comments)   Penicillins Hives   Warfarin Sodium Other (See Comments)   Warfarin Sodium Other (See Comments)   Iodinated Contrast Media Other (See Comments)    PT STATES SHE HAD AN ALLERGIC REACTION OF BLISTERS ON HANDS AND FEET 2 DAYS AFTER CT SCAN W/ IV CONTRAST INJECTION, DR. MANSELL SUGGESTS 13 HR PREP//A.C.    Social History   Socioeconomic History   Marital status: Single    Spouse name: Not on file   Number of children: Not on file   Years of education: Not on file   Highest education level:  Associate degree: occupational, Scientist, product/process development, or vocational program  Occupational History   Not on file  Tobacco Use   Smoking status: Former    Current packs/day: 0.00    Types: Cigarettes    Quit date: 09/18/2001    Years since quitting: 22.2   Smokeless tobacco: Never  Substance and Sexual Activity   Alcohol use: Yes   Drug use: No   Sexual activity: Not Currently  Other Topics Concern   Not on file  Social History Narrative   Not on file   Social Drivers of Health   Financial Resource Strain: Low Risk  (06/25/2023)   Overall Financial Resource Strain (CARDIA)    Difficulty of Paying Living Expenses: Not hard at all  Food Insecurity: No Food Insecurity (06/25/2023)   Hunger Vital Sign    Worried About Running Out of Food in the Last Year: Never true    Ran Out of Food in the  Last Year: Never true  Transportation Needs: No Transportation Needs (06/25/2023)   PRAPARE - Administrator, Civil Service (Medical): No    Lack of Transportation (Non-Medical): No  Physical Activity: Sufficiently Active (06/25/2023)   Exercise Vital Sign    Days of Exercise per Week: 5 days    Minutes of Exercise per Session: 150+ min  Stress: No Stress Concern Present (06/25/2023)   [employer] of Occupational Health - Occupational Stress Questionnaire    Feeling of Stress : Not at all  Social Connections: Moderately Isolated (06/25/2023)   Social Connection and Isolation Panel [NHANES]    Frequency of Communication with Friends and Family: More than three times a week    Frequency of Social Gatherings with Friends and Family: Twice a week    Attends Religious Services: 1 to 4 times per year    Active Member of [employer] or Organizations: No    Attends Banker Meetings: Never    Marital Status: Never married  Intimate Partner Violence: Not At Risk (02/27/2023)   Humiliation, Afraid, Rape, and Kick questionnaire    Fear of Current or Ex-Partner: No    Emotionally Abused: No     Physically Abused: No    Sexually Abused: No    Family History  Problem Relation Age of Onset   Asthma Other    Cancer Other    Hypertension Other    Diabetes Other     Past Surgical History:  Procedure Laterality Date   ABDOMINAL HYSTERECTOMY     CHOLECYSTECTOMY N/A 02/24/2019   Procedure: LAPAROSCOPIC CHOLECYSTECTOMY WITH INTRAOPERATIVE CHOLANGIOGRAM;  Surgeon: Manus Rudd, MD;  Location: [facility] OR;  Service: General;  Laterality: N/A;   KNEE SURGERY     left   THYROIDECTOMY     TOTAL KNEE ARTHROPLASTY     right knee    ROS: Review of Systems Negative except as stated above  PHYSICAL EXAM: BP 110/74 (BP Location: Left Arm, Patient Position: Sitting, Cuff Size: Large)   Pulse 73   Temp 98.3 F (36.8 C) (Oral)   Ht 5\' 2"  (1.575 m)   Wt 209 lb (94.8 kg)   SpO2 99%   BMI 38.23 kg/m   Wt Readings from Last 3 Encounters:  12/24/23 209 lb (94.8 kg)  10/31/23 222 lb (100.7 kg)  06/28/23 223 lb (101.2 kg)    Physical Exam  General appearance - alert, well appearing, and in no distress Mental status - normal mood, behavior, speech, dress, motor activity, and thought processes Neck - supple, no significant adenopathy Chest - clear to auscultation, no wheezes, rales or rhonchi, symmetric air entry Heart - normal rate, regular rhythm, normal S1, S2, no murmurs, rubs, clicks or gallops Extremities - peripheral pulses normal, no pedal edema, no clubbing or cyanosis MSK: Power 5/5 bilaterally for grip.     Latest Ref Rng & Units 10/31/2023    4:18 PM 04/01/2023   10:52 AM 02/02/2023    5:12 PM  CMP  Glucose 70 - 99 mg/dL 78  454    BUN 8 - 27 mg/dL 14  13    Creatinine 0.98 - 1.00 mg/dL 1.19  1.47    Sodium 829 - 144 mmol/L 140  139    Potassium 3.5 - 5.2 mmol/L 3.9  3.9    Chloride 96 - 106 mmol/L 103  102    CO2 22 - 32 mmol/L  23    Calcium 8.7 - 10.3 mg/dL 9.4  8.9  Total Protein 6.0 - 8.5 g/dL 7.9   7.9   Total Bilirubin 0.0 - 1.2 mg/dL 0.4   0.4   Alkaline  Phos 44 - 121 IU/L 94   131   AST 0 - 40 IU/L 15   14   ALT 0 - 32 IU/L   11    Lipid Panel     Component Value Date/Time   CHOL 127 02/02/2023 1712   TRIG 122 02/02/2023 1712   HDL 41 02/02/2023 1712   CHOLHDL 3.1 02/02/2023 1712   CHOLHDL 3.6 03/14/2016 1238   VLDL 12 03/14/2016 1238   LDLCALC 64 02/02/2023 1712    CBC    Component Value Date/Time   WBC 4.7 10/31/2023 1618   WBC 8.9 04/01/2023 1052   RBC 4.74 10/31/2023 1618   RBC 4.37 04/01/2023 1052   HGB 14.6 10/31/2023 1618   HCT 44.6 10/31/2023 1618   PLT 218 10/31/2023 1618   MCV 94 10/31/2023 1618   MCH 30.8 10/31/2023 1618   MCH 31.4 04/01/2023 1052   MCHC 32.7 10/31/2023 1618   MCHC 33.3 04/01/2023 1052   RDW 13.0 10/31/2023 1618   LYMPHSABS 1.5 10/31/2023 1618   MONOABS 0.4 04/01/2023 1052   EOSABS 0.3 10/31/2023 1618   BASOSABS 0.0 10/31/2023 1618    ASSESSMENT AND PLAN: 1. Type 2 diabetes mellitus with obesity (HCC) (Primary) At goal. We discussed keeping her on the same dose of Trulicity of 1.5 mg/week since she continues to lose weight on this dose.  I did not want to increase the dose since she sometimes does not eat because of decreased appetite.  Discussed the importance of eating 3 meals a day even if they are smaller.  Advised to try to eat a protein with each meal.  If she does not feel like eating breakfast, she can have something like [place] yogurt with some berries and mixed nuts in it. - Accu-Chek Softclix Lancets lancets; UAD to check BS once a day  Dispense: 100 each; Refill: 12 - glucose blood (ACCU-CHEK GUIDE TEST) test strip; UAD to check BS once a day  Dispense: 100 each; Refill: 12 - Blood Glucose Monitoring Suppl (ACCU-CHEK GUIDE) w/Device KIT; UAD to check BS once a day  Dispense: 1 kit; Refill: 0  2. Long-term (current) use of injectable non-insulin antidiabetic drugs See #1 above  3. Hypertension associated with diabetes (HCC) At goal.  Continue amlodipine 10 mg daily and  lisinopril 20 mg daily - lisinopril (ZESTRIL) 20 MG tablet; Take 1 tablet (20 mg total) by mouth daily.  Dispense: 90 tablet; Refill: 1  4. Hyperlipidemia associated with type 2 diabetes mellitus (HCC) Continue pravastatin  5. Postoperative hypothyroidism Continue levothyroxine 100 mcg daily. - TSH; Future         Patient was given the opportunity to ask questions.  Patient verbalized understanding of the plan and was able to repeat key elements of the plan.   This documentation was completed using Paediatric nurse.  Any transcriptional errors are unintentional.  No orders of the defined types were placed in this encounter.    Requested Prescriptions   Pending Prescriptions Disp Refills   lisinopril (ZESTRIL) 20 MG tablet 90 tablet 1    Sig: Take 1 tablet (20 mg total) by mouth daily.    No follow-ups on file.  Jonah Blue, MD, FACP

## 2024-01-22 ENCOUNTER — Other Ambulatory Visit: Payer: Self-pay | Admitting: Internal Medicine

## 2024-01-22 DIAGNOSIS — E890 Postprocedural hypothyroidism: Secondary | ICD-10-CM

## 2024-01-22 NOTE — Telephone Encounter (Signed)
 Copied from CRM [id number]. Topic: Clinical - Medication Refill >> Jan 22, 2024  8:10 AM Oddis Bench wrote: Most Recent Primary Care Visit:  Provider: Concetta Dee B  Department: [facility]  Visit Type: OFFICE VISIT  Date: 12/24/2023  Medication: levothyroxine  (SYNTHROID ) 100 MCG tablet  Has the patient contacted their pharmacy? No They inform her to call doctor's office when she does  Is this the correct pharmacy for this prescription? Yes If no, delete pharmacy and type the correct one.  This is the patient's preferred pharmacy:  [id number] - [place] - [address] [place]) [place] [address] Phone: [phone] Fax: [phone]    Has the prescription been filled recently? Yes  Is the patient out of the medication? Yes  Has the patient been seen for an appointment in the last year OR does the patient have an upcoming appointment? Yes  Can we respond through MyChart? No  Agent: Please be advised that Rx refills may take up to 3 business days. We ask that you follow-up with your pharmacy.

## 2024-03-11 ENCOUNTER — Other Ambulatory Visit: Payer: Self-pay | Admitting: Internal Medicine

## 2024-03-11 DIAGNOSIS — E1169 Type 2 diabetes mellitus with other specified complication: Secondary | ICD-10-CM

## 2024-03-17 ENCOUNTER — Encounter (HOSPITAL_COMMUNITY): Payer: Self-pay | Admitting: Emergency Medicine

## 2024-03-17 ENCOUNTER — Ambulatory Visit (HOSPITAL_COMMUNITY): Admission: EM | Admit: 2024-03-17 | Discharge: 2024-03-17 | Disposition: A | Discharge status: Home / Self Care

## 2024-03-17 DIAGNOSIS — S40861A Insect bite (nonvenomous) of right upper arm, initial encounter: Secondary | ICD-10-CM

## 2024-03-17 DIAGNOSIS — L03113 Cellulitis of right upper limb: Secondary | ICD-10-CM | POA: Diagnosis not present

## 2024-03-17 DIAGNOSIS — W57XXXA Bitten or stung by nonvenomous insect and other nonvenomous arthropods, initial encounter: Secondary | ICD-10-CM

## 2024-03-17 MED ORDER — HYDROCORTISONE 1 % EX CREA: TOPICAL_CREAM | CUTANEOUS | 0 refills | Status: DC

## 2024-03-17 MED ORDER — DOXYCYCLINE HYCLATE 100 MG PO CAPS: 100.0000 mg | ORAL_CAPSULE | Freq: Two times a day (BID) | ORAL | 0 refills | Status: DC

## 2024-03-17 NOTE — Discharge Instructions (Addendum)
 Start taking doxycycline  twice daily for infection coverage. You can apply hydrocortisone cream to the surrounding rash to help with itching and inflammation. Take cetirizine  (Zyrtec ) or loratadine  (Claritin ) once daily to help with itching. If you notice increased swelling, spreading redness, purulent drainage return here for reevaluation. If you develop a fever, body aches, chills, weakness please seek immediate medical treatment in the emergency department. Otherwise follow-up with your primary care provider as needed.

## 2024-03-17 NOTE — ED Provider Notes (Signed)
 [facility]    CSN: [id number] Arrival date & time: 03/17/24  1304      History   Chief Complaint Chief Complaint  Patient presents with   Insect Bite    HPI Kayla Bean is a 68 y.o. female.   Patient presents with concerns for bug bite that occurred to her right upper arm.  Patient states that she was getting out of her car on 6/25 when something bit her on her arm.  Patient states that the area has increased in swelling and redness, and states that the area is very itchy.  Patient is unsure of exactly what kind of bug bit her.  Patient states that she has been applying liquid bandage and taking Benadryl  without relief.  Denies fever, body aches, chills, and weakness.  The history is provided by the patient and medical records.    Past Medical History:  Diagnosis Date   Arthritis    Asthma    Diabetes mellitus    Heart murmur    Hemorrhoids    Hypertension    POAG (primary open-angle glaucoma)    BL - followed by Dr. Fate   Thyroid  disease    hypothyroidism    Patient Active Problem List   Diagnosis Date Noted   Long-term current use of injectable noninsulin antidiabetic medication 10/31/2023   Controlled type 2 diabetes mellitus without complication, without long-term current use of insulin  (HCC) 07/08/2020   Class 3 severe obesity due to excess calories with serious comorbidity and body mass index (BMI) of 40.0 to 44.9 in adult 10/01/2018   Moderate persistent asthma without complication 07/30/2017   Hyperlipidemia 03/22/2017   Cholelithiasis 12/24/2012   LIPOMA 07/26/2010   CONSTIPATION, CHRONIC 01/18/2009   EUSTACHIAN TUBE DYSFUNCTION, RIGHT 10/16/2008   LOM 09/01/2008   Essential hypertension 08/18/2008   Osteoarthritis of left knee 04/23/2008   Hypothyroidism 10/15/2007    Past Surgical History:  Procedure Laterality Date   ABDOMINAL HYSTERECTOMY     CHOLECYSTECTOMY N/A 02/24/2019   Procedure: LAPAROSCOPIC CHOLECYSTECTOMY WITH  INTRAOPERATIVE CHOLANGIOGRAM;  Surgeon: Belinda Cough, MD;  Location: [facility] OR;  Service: General;  Laterality: N/A;   KNEE SURGERY     left   THYROIDECTOMY     TOTAL KNEE ARTHROPLASTY     right knee    OB History   No obstetric history on file.      Home Medications    Prior to Admission medications   Medication Sig Start Date End Date Taking? Authorizing Provider  celecoxib (CELEBREX) 200 MG capsule Take 200 mg by mouth daily as needed. 02/04/24  Yes [provider]  doxycycline  (VIBRAMYCIN ) 100 MG capsule Take 1 capsule (100 mg total) by mouth 2 (two) times daily. 03/17/24  Yes Johnie Flaming A, NP  hydrocortisone cream 1 % Apply to affected area 2 times daily 03/17/24  Yes Johnie Flaming A, NP  Accu-Chek Softclix Lancets lancets UAD to check BS once a day 12/24/23   Vicci Barnie NOVAK, MD  albuterol  (VENTOLIN  HFA) 108 [phone] Base) MCG/ACT inhaler Inhale 2 puffs into the lungs every 6 (six) hours as needed for wheezing or shortness of breath. 10/25/22   Vicci Barnie NOVAK, MD  amLODipine  (NORVASC ) 10 MG tablet TAKE 1 TABLET BY MOUTH DAILY 09/10/23   Vicci Barnie NOVAK, MD  aspirin  81 MG tablet Take 1 tablet (81 mg total) daily by mouth. 07/30/17   Vicci Barnie NOVAK, MD  Blood Glucose Monitoring Suppl (ACCU-CHEK GUIDE) w/Device KIT UAD to check  BS once a day 12/24/23   Vicci Barnie NOVAK, MD  brimonidine -timolol  (COMBIGAN ) 0.2-0.5 % ophthalmic solution Place 1 drop into both eyes every 12 (twelve) hours.    [provider]  cycloSPORINE  (RESTASIS ) 0.05 % ophthalmic emulsion Place 1 drop into both eyes 2 (two) times daily. Patient not taking: Reported on 12/24/2023    [provider]  Dulaglutide  (TRULICITY ) 1.5 MG/0.5ML SOAJ Inject 1.5 mg into the skin once a week. 10/31/23   Mayers, Cari S, PA-C  glucose blood (ACCU-CHEK GUIDE TEST) test strip UAD to check BS once a day 12/24/23   Vicci Barnie NOVAK, MD  Lancet Devices [facility]) lancets Use as instructed for  3 times daily testing of blood glucose 08/09/16   Langeland, Dawn T, MD  Lancets Misc. (ACCU-CHEK FASTCLIX LANCET) KIT Use as directed 04/25/19   Vicci Barnie NOVAK, MD  latanoprost  (XALATAN ) 0.005 % ophthalmic solution Place 1 drop into both eyes at bedtime.     [provider]  levothyroxine  (SYNTHROID ) 100 MCG tablet Take 1 tablet (100 mcg total) by mouth daily before breakfast. 10/09/23   Vicci Barnie NOVAK, MD  lisinopril  (ZESTRIL ) 20 MG tablet Take 1 tablet (20 mg total) by mouth daily. 12/24/23   Vicci Barnie NOVAK, MD  pravastatin  (PRAVACHOL ) 40 MG tablet TAKE 1 TABLET BY MOUTH DAILY 03/12/24   Vicci Barnie NOVAK, MD  triamcinolone  cream (KENALOG ) 0.1 % Apply 1 Application topically 2 (two) times daily. Do not put on face Patient not taking: Reported on 12/24/2023 05/07/23   Vicci Barnie NOVAK, MD    Family History Family History  Problem Relation Age of Onset   Asthma Other    Cancer Other    Hypertension Other    Diabetes Other     Social History Social History   Tobacco Use   Smoking status: Former    Current packs/day: 0.00    Types: Cigarettes    Quit date: 09/18/2001    Years since quitting: 22.5   Smokeless tobacco: Never  Substance Use Topics   Alcohol use: Yes   Drug use: No     Allergies   Farxiga  [dapagliflozin ], Latex, Metformin , Metformin  and related, Penicillin g, Penicillins, Warfarin sodium, Warfarin sodium, and Iodinated contrast media   Review of Systems Review of Systems  Per HPI  Physical Exam Triage Vital Signs ED Triage Vitals  Encounter Vitals Group     BP 03/17/24 1415 106/74     Girls Systolic BP Percentile --      Girls Diastolic BP Percentile --      Boys Systolic BP Percentile --      Boys Diastolic BP Percentile --      Pulse Rate 03/17/24 1415 60     Resp 03/17/24 1415 17     Temp 03/17/24 1415 98 F (36.7 C)     Temp Source 03/17/24 1415 Oral     SpO2 03/17/24 1415 96 %     Weight --      Height --      Head  Circumference --      Peak Flow --      Pain Score 03/17/24 1414 0     Pain Loc --      Pain Education --      Exclude from Growth Chart --    No data found.  Updated Vital Signs BP 106/74 (BP Location: Left Arm)   Pulse 60   Temp 98 F (36.7 C) (Oral)   Resp 17  SpO2 96%   Visual Acuity Right Eye Distance:   Left Eye Distance:   Bilateral Distance:    Right Eye Near:   Left Eye Near:    Bilateral Near:     Physical Exam Vitals and nursing note reviewed.  Constitutional:      General: She is awake. She is not in acute distress.    Appearance: Normal appearance. She is well-developed and well-groomed. She is not ill-appearing.   Skin:    General: Skin is warm and dry.     Findings: Erythema, rash and wound present.         Comments: Area of erythema and rash noted to the posterior right upper arm with central wound that appears consistent with bug bite.  Approximately 5 cm in diameter area of induration to this area as well.   Neurological:     Mental Status: She is alert.   Psychiatric:        Behavior: Behavior is cooperative.      UC Treatments / Results  Labs (all labs ordered are listed, but only abnormal results are displayed) Labs Reviewed - No data to display  EKG   Radiology No results found.  Procedures Procedures (including critical care time)  Medications Ordered in UC Medications - No data to display  Initial Impression / Assessment and Plan / UC Course  I have reviewed the triage vital signs and the nursing notes.  Pertinent labs & imaging results that were available during my care of the patient were reviewed by me and considered in my medical decision making (see chart for details).     Patient is overall well-appearing.  Vitals are stable.  Upon assessment there is an area of erythema and rash noted to the posterior right upper arm with central wound that appears consistent with bug bite.  There is approximately 5 cm in diameter  area of induration to this area as well.  Prescribed doxycycline  for cellulitis coverage.  Prescribed hydrocortisone cream to apply to the rash.  Discussed over-the-counter antihistamines.  Discussed follow-up, return, and strict ER precautions Final Clinical Impressions(s) / UC Diagnoses   Final diagnoses:  Insect bite of right upper arm, initial encounter  Cellulitis of right upper extremity     Discharge Instructions      Start taking doxycycline  twice daily for infection coverage. You can apply hydrocortisone cream to the surrounding rash to help with itching and inflammation. Take cetirizine  (Zyrtec ) or loratadine  (Claritin ) once daily to help with itching. If you notice increased swelling, spreading redness, purulent drainage return here for reevaluation. If you develop a fever, body aches, chills, weakness please seek immediate medical treatment in the emergency department. Otherwise follow-up with your primary care provider as needed.    ED Prescriptions     Medication Sig Dispense Auth. Provider   doxycycline  (VIBRAMYCIN ) 100 MG capsule Take 1 capsule (100 mg total) by mouth 2 (two) times daily. 20 capsule Johnie Flaming A, NP   hydrocortisone cream 1 % Apply to affected area 2 times daily 15 g Johnie Flaming A, NP      PDMP not reviewed this encounter.   Johnie Flaming A, NP 03/17/24 1510

## 2024-03-17 NOTE — ED Triage Notes (Signed)
 Pt reports last Wed getting out of car when something bit her on right posterior arm. Reports used Liquid bandage and Benadryl  without relief. Reports itching.

## 2024-03-25 ENCOUNTER — Other Ambulatory Visit: Payer: Self-pay | Admitting: Physician Assistant

## 2024-03-26 ENCOUNTER — Telehealth: Payer: Self-pay | Admitting: Internal Medicine

## 2024-03-26 NOTE — Telephone Encounter (Signed)
 Rx RF sent to pharmacy today 03/26/2024

## 2024-03-26 NOTE — Telephone Encounter (Signed)
 Copied from CRM [id number]. Topic: Clinical - Prescription Issue >> Mar 26, 2024  9:07 AM Antwanette L wrote:  Reason for CRM: The patient submitted a refill request for Dulaglutide  (TRULICITY ) 1.5 MG/0.5ML SOAJ through [facility]. Since it is the last refill, the pharmacy needs Dr. Vicci permission. Please have Dr. Vicci to contact to the pharmacy. The patient can be contacted at [phone]  Pharmacy Details [facility] - [address], [place] ([place]) [place] [address] Phone: [phone]  Fax: [phone]

## 2024-05-01 ENCOUNTER — Ambulatory Visit: Attending: Internal Medicine | Admitting: Internal Medicine

## 2024-05-01 ENCOUNTER — Encounter: Payer: Self-pay | Admitting: Internal Medicine

## 2024-05-01 VITALS — BP 113/67 | HR 54 | Temp 98.1°F | Ht 62.0 in | Wt 193.0 lb

## 2024-05-01 DIAGNOSIS — Z7985 Long-term (current) use of injectable non-insulin antidiabetic drugs: Secondary | ICD-10-CM

## 2024-05-01 DIAGNOSIS — E1169 Type 2 diabetes mellitus with other specified complication: Secondary | ICD-10-CM | POA: Diagnosis not present

## 2024-05-01 DIAGNOSIS — I152 Hypertension secondary to endocrine disorders: Secondary | ICD-10-CM | POA: Diagnosis not present

## 2024-05-01 DIAGNOSIS — E1159 Type 2 diabetes mellitus with other circulatory complications: Secondary | ICD-10-CM | POA: Diagnosis not present

## 2024-05-01 DIAGNOSIS — E669 Obesity, unspecified: Secondary | ICD-10-CM | POA: Diagnosis not present

## 2024-05-01 DIAGNOSIS — Z6835 Body mass index (BMI) 35.0-35.9, adult: Secondary | ICD-10-CM | POA: Diagnosis not present

## 2024-05-01 DIAGNOSIS — E890 Postprocedural hypothyroidism: Secondary | ICD-10-CM | POA: Diagnosis not present

## 2024-05-01 DIAGNOSIS — E785 Hyperlipidemia, unspecified: Secondary | ICD-10-CM

## 2024-05-01 LAB — POCT GLYCOSYLATED HEMOGLOBIN (HGB A1C): HbA1c, POC (controlled diabetic range): 5.4 % (ref 0.0–7.0)

## 2024-05-01 LAB — GLUCOSE, POCT (MANUAL RESULT ENTRY): POC Glucose: 89 mg/dL (ref 70–99)

## 2024-05-01 NOTE — Patient Instructions (Signed)
 VISIT SUMMARY:  During your follow-up visit, we reviewed your management plan for diabetes, hypertension, hyperlipidemia, and hypothyroidism. You have made significant progress, especially with your diabetes and weight loss. 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  YOUR PLAN:  -TYPE 2 DIABETES MELLITUS: Type 2 diabetes is a condition where your body does not use insulin  properly, leading to high blood sugar levels. Your diabetes is well-controlled with an A1c of 5.5%. Continue taking Trulicity  1.5 mg weekly, monitor your blood glucose regularly, and avoid skipping meals by eating protein-rich foods.  -OBESITY: Obesity is a condition characterized by excessive body fat. You have lost 30 pounds with the help of Trulicity . It's important to resume regular exercise to maintain muscle mass.  -HYPERTENSION: Hypertension is high blood pressure. Your blood pressure is well-controlled with your current medications. Continue taking amlodipine  10 mg daily and lisinopril  20 mg daily.  -HYPERLIPIDEMIA: Hyperlipidemia is having high levels of fats in the blood. Continue taking pravastatin  as prescribed. We will order a cholesterol level test to monitor your condition.  -HYPOTHYROIDISM: Hypothyroidism is a condition where your thyroid  gland does not produce enough thyroid  hormone. Continue taking levothyroxine  100 mcg daily. We will order a thyroid  level test to ensure your condition is well-managed.  INSTRUCTIONS:  Please continue with your current medications as prescribed. We will order tests for your cholesterol and thyroid  levels. Make sure to monitor your blood glucose regularly and try to resume regular exercise. Follow up with us  if you have any concerns or notice any changes in your health.

## 2024-05-01 NOTE — Progress Notes (Signed)
 Patient ID: Kayla Bean, female    DOB: 12-23-1955  MRN: [id number]  CC: Diabetes (DM f/u. Jerre questions / concerns/)   Subjective: Kayla Bean is a 68 y.o. female who presents for chronic ds management. Her concerns today include:  Patient with history of diabetes, hypertension, HL, post thyroidectomy hypothyroidism (1973, noncancerous), asthma, CKD stage III, OA knee and obesity, glaucoma, mild intermittent asthma.   Discussed the use of AI scribe software for clinical note transcription with the patient, who gave verbal consent to proceed.  History of Present Illness Kayla Bean is a 68 year old female with diabetes, hypertension, hyperlipidemia, and hypothyroidism who presents for a follow-up visit.  DM: Results for orders placed or performed in visit on 05/01/24  POCT glucose (manual entry)   Collection Time: 05/01/24  3:42 PM  Result Value Ref Range   POC Glucose 89 70 - 99 mg/dl  POCT glycosylated hemoglobin (Hb A1C)   Collection Time: 05/01/24  4:08 PM  Result Value Ref Range   Hemoglobin A1C     HbA1c POC (<> result, manual entry)     HbA1c, POC (prediabetic range)     HbA1c, POC (controlled diabetic range) 5.4 0.0 - 7.0 %   She is currently on Trulicity  1.5 mg once a week for diabetes management and tolerates the medication well. She notes a decrease in appetite due to the medication. She checks her blood sugar once a week, with the most recent check being the morning of the visit. Since February, she has lost approximately 30 pounds, with her weight decreasing from 222 pounds to 193 pounds.   She has not been able to exercise recently due to taking guardianship of her brother, a disabled veteran with paranoid schizophrenia. This has involved managing his care and legal matters, which has limited her ability to visit the gym.  For hypertension, she is on amlodipine  10 mg daily and lisinopril  20 mg daily. Her home blood pressure reading was 121/71  mmHg today, and she reports consistent adherence to her medication regimen.  HL: she continues to take pravastatin  40 mg daily and tolerating it well.  Hypothyroidism: She continues to take levothyroxine  100 mcg daily.      Patient Active Problem List   Diagnosis Date Noted   Long-term current use of injectable noninsulin antidiabetic medication 10/31/2023   Controlled type 2 diabetes mellitus without complication, without long-term current use of insulin  (HCC) 07/08/2020   Class 3 severe obesity due to excess calories with serious comorbidity and body mass index (BMI) of 40.0 to 44.9 in adult 10/01/2018   Moderate persistent asthma without complication 07/30/2017   Hyperlipidemia 03/22/2017   Cholelithiasis 12/24/2012   LIPOMA 07/26/2010   CONSTIPATION, CHRONIC 01/18/2009   EUSTACHIAN TUBE DYSFUNCTION, RIGHT 10/16/2008   LOM 09/01/2008   Essential hypertension 08/18/2008   Osteoarthritis of left knee 04/23/2008   Hypothyroidism 10/15/2007     Current Outpatient Medications on File Prior to Visit  Medication Sig Dispense Refill   Accu-Chek Softclix Lancets lancets UAD to check BS once a day 100 each 12   albuterol  (VENTOLIN  HFA) 108 (90 Base) MCG/ACT inhaler Inhale 2 puffs into the lungs every 6 (six) hours as needed for wheezing or shortness of breath. 18 g 2   amLODipine  (NORVASC ) 10 MG tablet TAKE 1 TABLET BY MOUTH DAILY 100 tablet 2   aspirin  81 MG tablet Take 1 tablet (81 mg total) daily by mouth. 90 tablet 0   Blood Glucose Monitoring  Suppl (ACCU-CHEK GUIDE) w/Device KIT UAD to check BS once a day 1 kit 0   brimonidine -timolol  (COMBIGAN ) 0.2-0.5 % ophthalmic solution Place 1 drop into both eyes every 12 (twelve) hours.     cycloSPORINE  (RESTASIS ) 0.05 % ophthalmic emulsion Place 1 drop into both eyes 2 (two) times daily.     Dulaglutide  (TRULICITY ) 1.5 MG/0.5ML SOAJ INJECT 1.5 MG INTO THE SKIN ONCE A WEEK 4 mL 5   glucose blood (ACCU-CHEK GUIDE TEST) test strip UAD to check  BS once a day 100 each 12   Lancet Devices (ACCU-CHEK SOFTCLIX) lancets Use as instructed for 3 times daily testing of blood glucose 1 each 0   Lancets Misc. (ACCU-CHEK FASTCLIX LANCET) KIT Use as directed 1 kit 12   latanoprost  (XALATAN ) 0.005 % ophthalmic solution Place 1 drop into both eyes at bedtime.      levothyroxine  (SYNTHROID ) 100 MCG tablet Take 1 tablet (100 mcg total) by mouth daily before breakfast. 100 tablet 2   lisinopril  (ZESTRIL ) 20 MG tablet Take 1 tablet (20 mg total) by mouth daily. 90 tablet 1   pravastatin  (PRAVACHOL ) 40 MG tablet TAKE 1 TABLET BY MOUTH DAILY 100 tablet 2   hydrocortisone  cream 1 % Apply to affected area 2 times daily (Patient not taking: Reported on 05/01/2024) 15 g 0   triamcinolone  cream (KENALOG ) 0.1 % Apply 1 Application topically 2 (two) times daily. Do not put on face (Patient not taking: Reported on 05/01/2024) 30 g 0   No current facility-administered medications on file prior to visit.    Allergies  Allergen Reactions   Farxiga  [Dapagliflozin ] Other (See Comments)    Recurrent vaginal yeast   Latex Hives and Other (See Comments)   Metformin      Other reaction(s): hair loss   Metformin  And Related     Hair loss   Penicillin G Other (See Comments)   Penicillins Hives   Warfarin Sodium Other (See Comments)   Warfarin Sodium Other (See Comments)   Iodinated Contrast Media Other (See Comments)    PT STATES SHE HAD AN ALLERGIC REACTION OF BLISTERS ON HANDS AND FEET 2 DAYS AFTER CT SCAN W/ IV CONTRAST INJECTION, DR. MANSELL SUGGESTS 13 HR PREP//A.C.    Social History   Socioeconomic History   Marital status: Single    Spouse name: Not on file   Number of children: Not on file   Years of education: Not on file   Highest education level: Associate degree: occupational, Scientist, product/process development, or vocational program  Occupational History   Not on file  Tobacco Use   Smoking status: Former    Current packs/day: 0.00    Types: Cigarettes    Quit date:  09/18/2001    Years since quitting: 22.6   Smokeless tobacco: Never  Substance and Sexual Activity   Alcohol use: Yes   Drug use: No   Sexual activity: Not Currently  Other Topics Concern   Not on file  Social History Narrative   Not on file   Social Drivers of Health   Financial Resource Strain: Low Risk  (06/25/2023)   Overall Financial Resource Strain (CARDIA)    Difficulty of Paying Living Expenses: Not hard at all  Food Insecurity: No Food Insecurity (06/25/2023)   Hunger Vital Sign    Worried About Running Out of Food in the Last Year: Never true    Ran Out of Food in the Last Year: Never true  Transportation Needs: No Transportation Needs (06/25/2023)   PRAPARE -  Administrator, Civil Service (Medical): No    Lack of Transportation (Non-Medical): No  Physical Activity: Sufficiently Active (06/25/2023)   Exercise Vital Sign    Days of Exercise per Week: 5 days    Minutes of Exercise per Session: 150+ min  Stress: No Stress Concern Present (06/25/2023)   [employer] of Occupational Health - Occupational Stress Questionnaire    Feeling of Stress : Not at all  Social Connections: Moderately Isolated (06/25/2023)   Social Connection and Isolation Panel    Frequency of Communication with Friends and Family: More than three times a week    Frequency of Social Gatherings with Friends and Family: Twice a week    Attends Religious Services: 1 to 4 times per year    Active Member of [employer] or Organizations: No    Attends Banker Meetings: Never    Marital Status: Never married  Intimate Partner Violence: Not At Risk (02/27/2023)   Humiliation, Afraid, Rape, and Kick questionnaire    Fear of Current or Ex-Partner: No    Emotionally Abused: No    Physically Abused: No    Sexually Abused: No    Family History  Problem Relation Age of Onset   Asthma Other    Cancer Other    Hypertension Other    Diabetes Other     Past Surgical History:  Procedure  Laterality Date   ABDOMINAL HYSTERECTOMY     CHOLECYSTECTOMY N/A 02/24/2019   Procedure: LAPAROSCOPIC CHOLECYSTECTOMY WITH INTRAOPERATIVE CHOLANGIOGRAM;  Surgeon: Belinda Cough, MD;  Location: [facility] OR;  Service: General;  Laterality: N/A;   KNEE SURGERY     left   THYROIDECTOMY     TOTAL KNEE ARTHROPLASTY     right knee    ROS: Review of Systems Negative except as stated above  PHYSICAL EXAM: BP 113/67 (BP Location: Left Arm, Patient Position: Sitting, Cuff Size: Normal)   Pulse (!) 54   Temp 98.1 F (36.7 C) (Oral)   Ht 5' 2 (1.575 m)   Wt 193 lb (87.5 kg)   SpO2 98%   BMI 35.30 kg/m   Wt Readings from Last 3 Encounters:  05/01/24 193 lb (87.5 kg)  12/24/23 209 lb (94.8 kg)  10/31/23 222 lb (100.7 kg)    Physical Exam  General appearance - alert, well appearing, and in no distress Mental status - normal mood, behavior, speech, dress, motor activity, and thought processes Neck - supple, no significant adenopathy Chest - clear to auscultation, no wheezes, rales or rhonchi, symmetric air entry Heart - normal rate, regular rhythm, normal S1, S2, no murmurs, rubs, clicks or gallops Extremities - peripheral pulses normal, no pedal edema, no clubbing or cyanosis Diabetic Foot Exam - Simple   Simple Foot Form Diabetic Foot exam was performed with the following findings: Yes 05/01/2024  4:22 PM  Visual Inspection See comments: Yes Sensation Testing Intact to touch and monofilament testing bilaterally: Yes Pulse Check Posterior Tibialis and Dorsalis pulse intact bilaterally: Yes Comments Noninflamed bunion on the right big toe.         Latest Ref Rng & Units 10/31/2023    4:18 PM 04/01/2023   10:52 AM 02/02/2023    5:12 PM  CMP  Glucose 70 - 99 mg/dL 78  866    BUN 8 - 27 mg/dL 14  13    Creatinine 9.42 - 1.00 mg/dL 9.11  8.79    Sodium 865 - 144 mmol/L 140  139  Potassium 3.5 - 5.2 mmol/L 3.9  3.9    Chloride 96 - 106 mmol/L 103  102    CO2 22 - 32 mmol/L  23     Calcium 8.7 - 10.3 mg/dL 9.4  8.9    Total Protein 6.0 - 8.5 g/dL 7.9   7.9   Total Bilirubin 0.0 - 1.2 mg/dL 0.4   0.4   Alkaline Phos 44 - 121 IU/L 94   131   AST 0 - 40 IU/L 15   14   ALT 0 - 32 IU/L   11    Lipid Panel     Component Value Date/Time   CHOL 127 02/02/2023 1712   TRIG 122 02/02/2023 1712   HDL 41 02/02/2023 1712   CHOLHDL 3.1 02/02/2023 1712   CHOLHDL 3.6 03/14/2016 1238   VLDL 12 03/14/2016 1238   LDLCALC 64 02/02/2023 1712    CBC    Component Value Date/Time   WBC 4.7 10/31/2023 1618   WBC 8.9 04/01/2023 1052   RBC 4.74 10/31/2023 1618   RBC 4.37 04/01/2023 1052   HGB 14.6 10/31/2023 1618   HCT 44.6 10/31/2023 1618   PLT 218 10/31/2023 1618   MCV 94 10/31/2023 1618   MCH 30.8 10/31/2023 1618   MCH 31.4 04/01/2023 1052   MCHC 32.7 10/31/2023 1618   MCHC 33.3 04/01/2023 1052   RDW 13.0 10/31/2023 1618   LYMPHSABS 1.5 10/31/2023 1618   MONOABS 0.4 04/01/2023 1052   EOSABS 0.3 10/31/2023 1618   BASOSABS 0.0 10/31/2023 1618    ASSESSMENT AND PLAN: 1. Type 2 diabetes mellitus with obesity (HCC) (Primary) At goal.  She is taking and tolerating Trulicity  and has had significant weight loss with the medication.  We will continue the Trulicity  1.5 mg once a week.  She aims to get back to the [facility] soon once she gets her brother settled in. - POCT glycosylated hemoglobin (Hb A1C) - POCT glucose (manual entry) - Microalbumin / creatinine urine ratio  2. Long-term (current) use of injectable non-insulin  antidiabetic drugs See #1 above  3. Hypertension associated with diabetes (HCC) At goal.  Continue lisinopril  20 mg and amlodipine  10 mg  4. Hyperlipidemia associated with type 2 diabetes mellitus (HCC) Continue pravastatin  40 mg daily.  Due for lipid profile today - Lipid panel  5. Postoperative hypothyroidism Continue levothyroxine  100 mcg daily - TSH  Patient was given the opportunity to ask questions.  Patient verbalized understanding of the  plan and was able to repeat key elements of the plan.   This documentation was completed using Paediatric nurse.  Any transcriptional errors are unintentional.  Orders Placed This Encounter  Procedures   Microalbumin / creatinine urine ratio   TSH   Lipid panel   POCT glycosylated hemoglobin (Hb A1C)   POCT glucose (manual entry)     Requested Prescriptions    No prescriptions requested or ordered in this encounter    Return in about 4 months (around 08/31/2024) for Medicare Wellness Visit in 1-6  wks with CMA/LPN.  Barnie Louder, MD, FACP

## 2024-05-02 LAB — LIPID PANEL
Chol/HDL Ratio: 3.3 ratio (ref 0.0–4.4)
Cholesterol, Total: 142 mg/dL (ref 100–199)
HDL: 43 mg/dL (ref >39)
LDL Chol Calc (NIH): 85 mg/dL (ref 0–99)
Triglycerides: 69 mg/dL (ref 0–149)
VLDL Cholesterol Cal: 14 mg/dL (ref 5–40)

## 2024-05-02 LAB — TSH: TSH: 0.69 u[IU]/mL (ref 0.450–4.500)

## 2024-05-02 LAB — MICROALBUMIN / CREATININE URINE RATIO
Creatinine, Urine: 327.1 mg/dL
Microalb/Creat Ratio: 9 mg/g{creat} (ref 0–29)
Microalbumin, Urine: 29.6 ug/mL

## 2024-05-03 ENCOUNTER — Ambulatory Visit: Payer: Self-pay | Admitting: Internal Medicine

## 2024-05-03 DIAGNOSIS — E890 Postprocedural hypothyroidism: Secondary | ICD-10-CM

## 2024-05-26 ENCOUNTER — Other Ambulatory Visit: Payer: Self-pay | Admitting: Internal Medicine

## 2024-05-26 DIAGNOSIS — I10 Essential (primary) hypertension: Secondary | ICD-10-CM

## 2024-05-26 DIAGNOSIS — I152 Hypertension secondary to endocrine disorders: Secondary | ICD-10-CM

## 2024-05-28 NOTE — Telephone Encounter (Signed)
 Requested Prescriptions  Pending Prescriptions Disp Refills   amLODipine  (NORVASC ) 10 MG tablet [Pharmacy Med Name: amLODIPine  Besylate 10 MG Oral Tablet] 100 tablet 1    Sig: TAKE 1 TABLET BY MOUTH DAILY     Cardiovascular: Calcium Channel Blockers 2 Passed - 05/28/2024  8:05 AM      Passed - Last BP in normal range    BP Readings from Last 1 Encounters:  05/01/24 113/67         Passed - Last Heart Rate in normal range    Pulse Readings from Last 1 Encounters:  05/01/24 (!) 54         Passed - Valid encounter within last 6 months    Recent Outpatient Visits           3 weeks ago Type 2 diabetes mellitus with obesity (HCC)   [facility] [place]. [facility] Vicci Sober B, MD   5 months ago Type 2 diabetes mellitus with obesity [facility])   [place] [facility] [place]. [facility] Vicci Sober B, MD   11 months ago Type 2 diabetes mellitus with morbid obesity [facility])   [place] [facility] [place]. [facility] Vicci Sober B, MD   1 year ago Type 2 diabetes mellitus with morbid obesity [facility])   [place] [facility] [place]. [facility] Vicci Sober B, MD   1 year ago Type 2 diabetes mellitus with morbid obesity [facility])    [facility] [place]. [facility] Vicci Sober NOVAK, MD       Future Appointments             In 3 months Vicci Sober NOVAK, MD [facility] - [facility] Dept Of [place]. [facility] [address]             lisinopril  (ZESTRIL ) 20 MG tablet [Pharmacy Med Name: Lisinopril  20 MG Oral Tablet] 100 tablet 2    Sig: TAKE 1 TABLET BY MOUTH DAILY     Cardiovascular:  ACE Inhibitors Failed - 05/28/2024  8:05 AM      Failed - Cr in normal range and within 180 days    Creat  Date Value Ref Range Status  07/19/2016 1.29 (H) 0.50 - 0.99 mg/dL Final     Comment:      For patients > or = 70 years of age: The upper reference limit for Creatinine is approximately 13% higher for people identified as African-American.      Creatinine, Ser  Date Value Ref Range Status  10/31/2023 0.88 0.57 - 1.00 mg/dL Final   Creatinine, Urine  Date Value Ref Range Status  03/19/2013 129.5 mg/dL Final         Failed - K in normal range and within 180 days    Potassium  Date Value Ref Range Status  10/31/2023 3.9 3.5 - 5.2 mmol/L Final         Passed - Patient is not pregnant      Passed - Last BP in normal range    BP Readings from Last 1 Encounters:  05/01/24 113/67         Passed - Valid encounter within last 6 months    Recent Outpatient Visits  3 weeks ago Type 2 diabetes mellitus with obesity (HCC)   [place] [facility] [place]. [facility] Vicci Sober B, MD   5 months ago Type 2 diabetes mellitus with obesity [facility])   [place] [facility] [place]. [facility] Vicci Sober B, MD   11 months ago Type 2 diabetes mellitus with morbid obesity [facility])   [place] [facility] Of [place]. [facility] Vicci Sober B, MD   1 year ago Type 2 diabetes mellitus with morbid obesity [facility])   [place] [facility] Dept Of [place]. [facility] Vicci Sober B, MD   1 year ago Type 2 diabetes mellitus with morbid obesity [facility])   [place] [facility] Dept Of [place]. [facility] Vicci Sober NOVAK, MD       Future Appointments             In 3 months Vicci Sober NOVAK, MD [facility] - A Dept Of [place]. [facility], [place]

## 2024-06-04 ENCOUNTER — Other Ambulatory Visit: Payer: Self-pay | Admitting: Internal Medicine

## 2024-06-04 DIAGNOSIS — E1159 Type 2 diabetes mellitus with other circulatory complications: Secondary | ICD-10-CM

## 2024-06-12 ENCOUNTER — Ambulatory Visit: Attending: Internal Medicine

## 2024-06-12 DIAGNOSIS — E890 Postprocedural hypothyroidism: Secondary | ICD-10-CM

## 2024-06-13 ENCOUNTER — Ambulatory Visit: Payer: Self-pay | Admitting: Internal Medicine

## 2024-06-13 LAB — TSH: TSH: 0.67 u[IU]/mL (ref 0.450–4.500)

## 2024-08-11 ENCOUNTER — Other Ambulatory Visit: Payer: Self-pay | Admitting: Internal Medicine

## 2024-08-11 DIAGNOSIS — E890 Postprocedural hypothyroidism: Secondary | ICD-10-CM

## 2024-09-01 ENCOUNTER — Ambulatory Visit: Attending: Internal Medicine | Admitting: Internal Medicine

## 2024-09-01 ENCOUNTER — Encounter: Payer: Self-pay | Admitting: Internal Medicine

## 2024-09-01 DIAGNOSIS — E890 Postprocedural hypothyroidism: Secondary | ICD-10-CM | POA: Diagnosis not present

## 2024-09-01 DIAGNOSIS — E1169 Type 2 diabetes mellitus with other specified complication: Secondary | ICD-10-CM | POA: Diagnosis not present

## 2024-09-01 DIAGNOSIS — I10 Essential (primary) hypertension: Secondary | ICD-10-CM

## 2024-09-01 DIAGNOSIS — E785 Hyperlipidemia, unspecified: Secondary | ICD-10-CM

## 2024-09-01 DIAGNOSIS — Z7985 Long-term (current) use of injectable non-insulin antidiabetic drugs: Secondary | ICD-10-CM

## 2024-09-01 DIAGNOSIS — I152 Hypertension secondary to endocrine disorders: Secondary | ICD-10-CM

## 2024-09-01 DIAGNOSIS — E1159 Type 2 diabetes mellitus with other circulatory complications: Secondary | ICD-10-CM

## 2024-09-01 LAB — POCT GLYCOSYLATED HEMOGLOBIN (HGB A1C): HbA1c, POC (controlled diabetic range): 5.6 % (ref 0.0–7.0)

## 2024-09-01 LAB — GLUCOSE, POCT (MANUAL RESULT ENTRY): POC Glucose: 93 mg/dL (ref 70–99)

## 2024-09-01 NOTE — Patient Instructions (Signed)
°  VISIT SUMMARY: Today, we reviewed your diabetes, hypertension, and cholesterol management. Your diabetes is well-controlled, but you have gained some weight. Your blood pressure is stable, and your cholesterol levels need to be checked again. We also discussed your thyroid  management and general health maintenance.  YOUR PLAN: -TYPE 2 DIABETES MELLITUS: Type 2 diabetes is a condition where your body does not use insulin  properly, leading to high blood sugar levels. Your diabetes is well-controlled with an A1c of 5.6% and a blood sugar level of 93 mg/dL. Continue taking Trulicity  1.5 mg weekly. Focus on healthy eating, regular exercise, portion control, and limiting sweets. We have ordered blood tests to check your overall health and will reassess your diabetes management at your next visit.  -HYPERTENSION: Hypertension, or high blood pressure, is when the force of your blood against your artery walls is too high. Your blood pressure is well-controlled at 124/67 mmHg. Continue taking lisinopril  20 mg and amlodipine  10 mg as prescribed, and keep limiting salt in your diet.  -HYPERLIPIDEMIA: Hyperlipidemia is when you have high levels of fats (lipids) in your blood, which can increase your risk of heart disease. You are taking pravastatin  40 mg daily to manage your cholesterol. Your last cholesterol level was 85 mg/dL, but it needs to be checked again.  -POSTOPERATIVE HYPOTHYROIDISM: Hypothyroidism is when your thyroid  gland does not produce enough thyroid  hormone. Your thyroid  levels are stable with your current levothyroxine  therapy. Continue taking levothyroxine  as prescribed.  -GENERAL HEALTH MAINTENANCE: For your overall health, you need to schedule a diabetic eye exam and a Medicare wellness visit. These are important for monitoring your health and catching any potential issues early.  INSTRUCTIONS: Please schedule your diabetic eye exam before Christmas and call to schedule your Medicare  wellness visit. We will reassess your diabetes management at your next visit.                      Contains text generated by [employer].                                 Contains text generated by Abridge.

## 2024-09-01 NOTE — Progress Notes (Signed)
 Patient ID: Kayla Bean, female    DOB: [birth year]  MRN: [id number]  CC: Diabetes (DM f/u./No questions / concerns/Already received flu vax)   Subjective: Kayla Bean is a 68 y.o. female who presents for chronic ds management. Her concerns today include:  Patient with history of diabetes, hypertension, HL, post thyroidectomy hypothyroidism (1973, noncancerous), asthma, CKD stage III, OA knee and obesity, glaucoma, mild intermittent asthma.   Discussed the use of AI scribe software for clinical note transcription with the patient, who gave verbal consent to proceed.  History of Present Illness Kayla Bean is a 68 year old female with diabetes and hypertension who presents for routine follow-up.  DM: Results for orders placed or performed in visit on 09/01/24  POCT glucose (manual entry)   Collection Time: 09/01/24  4:07 PM  Result Value Ref Range   POC Glucose 93 70 - 99 mg/dl  POCT glycosylated hemoglobin (Hb A1C)   Collection Time: 09/01/24  4:07 PM  Result Value Ref Range   Hemoglobin A1C     HbA1c POC (<> result, manual entry)     HbA1c, POC (prediabetic range)     HbA1c, POC (controlled diabetic range) 5.6 0.0 - 7.0 %  Her most recent A1c was 5.6% and her blood glucose was 93 mg/dL. She is on Trulicity  1.5 mg weekly and tolerates it well. She has gained 7 pounds since her last visit, attributed to dietary changes while caring for her brother. She has not resumed swimming, which she used to do at the [facility], due to cold weather and concerns about leaving her brother alone. Her brother now has his own place. Additionally, she had carpal tunnel surgery on her left hand in August, which also prevented her from swimming until the surgical wound had healed. She checks her blood sugar twice a week and reports good range. She has not yet had her diabetic eye exam, which she usually schedules herself. She missed her last appointment, which was supposed to be last  month, and plans to call to reschedule.  HTN: Has BP reading from this a.m with her, it is in targeted range. Her blood pressure was 124/67 mmHg at today's visit. She is taking lisinopril  20 mg and amlodipine  10 mg consistently and has limited salt intake in her diet. No CP or LE edema.  HL: She is on pravastatin  40 mg daily for cholesterol management. Her last cholesterol check showed LDL level of 85 mg/dL.  She is taking levothyroxine  100 mcg daily for thyroid  management, and her thyroid  levels were good when last checked in September.  Socially, she is planning to travel to [place]  for Christmas to see her grandbabies.    Patient Active Problem List   Diagnosis Date Noted   Long-term current use of injectable noninsulin antidiabetic medication 10/31/2023   Controlled type 2 diabetes mellitus without complication, without long-term current use of insulin  (HCC) 07/08/2020   Class 3 severe obesity due to excess calories with serious comorbidity and body mass index (BMI) of 40.0 to 44.9 in adult (HCC) 10/01/2018   Moderate persistent asthma without complication 07/30/2017   Hyperlipidemia 03/22/2017   Cholelithiasis 12/24/2012   LIPOMA 07/26/2010   CONSTIPATION, CHRONIC 01/18/2009   EUSTACHIAN TUBE DYSFUNCTION, RIGHT 10/16/2008   LOM 09/01/2008   Essential hypertension 08/18/2008   Osteoarthritis of left knee 04/23/2008   Hypothyroidism 10/15/2007     Medications Ordered Prior to Encounter[1]  Allergies[2]  Social History   Socioeconomic History   Marital  status: Single    Spouse name: Not on file   Number of children: Not on file   Years of education: Not on file   Highest education level: Associate degree: occupational, technical, or vocational program  Occupational History   Not on file  Tobacco Use   Smoking status: Former    Current packs/day: 0.00    Types: Cigarettes    Quit date: 09/18/2001    Years since quitting: 22.9   Smokeless tobacco: Never  Substance and  Sexual Activity   Alcohol use: Yes   Drug use: No   Sexual activity: Not Currently  Other Topics Concern   Not on file  Social History Narrative   Not on file   Social Drivers of Health   Tobacco Use: Medium Risk (05/01/2024)   Patient History    Smoking Tobacco Use: Former    Smokeless Tobacco Use: Never    Passive Exposure: Not on Actuary Strain: Low Risk (06/25/2023)   Overall Financial Resource Strain (CARDIA)    Difficulty of Paying Living Expenses: Not hard at all  Food Insecurity: No Food Insecurity (06/25/2023)   Hunger Vital Sign    Worried About Running Out of Food in the Last Year: Never true    Ran Out of Food in the Last Year: Never true  Transportation Needs: No Transportation Needs (06/25/2023)   PRAPARE - Administrator, Civil Service (Medical): No    Lack of Transportation (Non-Medical): No  Physical Activity: Sufficiently Active (06/25/2023)   Exercise Vital Sign    Days of Exercise per Week: 5 days    Minutes of Exercise per Session: 150+ min  Stress: No Stress Concern Present (06/25/2023)   [employer] of Occupational Health - Occupational Stress Questionnaire    Feeling of Stress : Not at all  Social Connections: Moderately Isolated (06/25/2023)   Social Connection and Isolation Panel    Frequency of Communication with Friends and Family: More than three times a week    Frequency of Social Gatherings with Friends and Family: Twice a week    Attends Religious Services: 1 to 4 times per year    Active Member of [employer] or Organizations: No    Attends Banker Meetings: Never    Marital Status: Never married  Intimate Partner Violence: Not At Risk (02/27/2023)   Humiliation, Afraid, Rape, and Kick questionnaire    Fear of Current or Ex-Partner: No    Emotionally Abused: No    Physically Abused: No    Sexually Abused: No  Depression (PHQ2-9): Low Risk (05/01/2024)   Depression (PHQ2-9)    PHQ-2 Score: 0  Alcohol  Screen: Low Risk (06/25/2023)   Alcohol Screen    Last Alcohol Screening Score (AUDIT): 1  Housing: Low Risk (06/25/2023)   Housing    Last Housing Risk Score: 0  Utilities: Not At Risk (02/27/2023)   AHC Utilities    Threatened with loss of utilities: No  Health Literacy: Not on file    Family History  Problem Relation Age of Onset   Asthma Other    Cancer Other    Hypertension Other    Diabetes Other     Past Surgical History:  Procedure Laterality Date   ABDOMINAL HYSTERECTOMY     CHOLECYSTECTOMY N/A 02/24/2019   Procedure: LAPAROSCOPIC CHOLECYSTECTOMY WITH INTRAOPERATIVE CHOLANGIOGRAM;  Surgeon: Belinda Cough, MD;  Location: [facility] OR;  Service: General;  Laterality: N/A;   KNEE SURGERY     left  THYROIDECTOMY     TOTAL KNEE ARTHROPLASTY     right knee    ROS: Review of Systems Negative except as stated above  PHYSICAL EXAM: BP 124/67 (BP Location: Left Arm, Patient Position: Sitting, Cuff Size: Large)   Pulse (!) 55   Temp 98 F (36.7 C) (Oral)   Ht 5' 2 (1.575 m)   Wt 200 lb (90.7 kg)   SpO2 99%   BMI 36.58 kg/m   Wt Readings from Last 3 Encounters:  09/01/24 200 lb (90.7 kg)  05/01/24 193 lb (87.5 kg)  12/24/23 209 lb (94.8 kg)    Physical Exam  General appearance - alert, well appearing, elderly AAF and in no distress Mental status - normal mood, behavior, speech, dress, motor activity, and thought processes Neck - supple, no significant adenopathy Chest - clear to auscultation, no wheezes, rales or rhonchi, symmetric air entry Heart - normal rate, regular rhythm, normal S1, S2, no murmurs, rubs, clicks or gallops Extremities - peripheral pulses normal, no pedal edema, no clubbing or cyanosis      Latest Ref Rng & Units 10/31/2023    4:18 PM 04/01/2023   10:52 AM 02/02/2023    5:12 PM  CMP  Glucose 70 - 99 mg/dL 78  866    BUN 8 - 27 mg/dL 14  13    Creatinine 9.42 - 1.00 mg/dL 9.11  8.79    Sodium 865 - 144 mmol/L 140  139    Potassium 3.5 - 5.2  mmol/L 3.9  3.9    Chloride 96 - 106 mmol/L 103  102    CO2 22 - 32 mmol/L  23    Calcium 8.7 - 10.3 mg/dL 9.4  8.9    Total Protein 6.0 - 8.5 g/dL 7.9   7.9   Total Bilirubin 0.0 - 1.2 mg/dL 0.4   0.4   Alkaline Phos 44 - 121 IU/L 94   131   AST 0 - 40 IU/L 15   14   ALT 0 - 32 IU/L   11    Lipid Panel     Component Value Date/Time   CHOL 142 05/01/2024 1639   TRIG 69 05/01/2024 1639   HDL 43 05/01/2024 1639   CHOLHDL 3.3 05/01/2024 1639   CHOLHDL 3.6 03/14/2016 1238   VLDL 12 03/14/2016 1238   LDLCALC 85 05/01/2024 1639    CBC    Component Value Date/Time   WBC 4.7 10/31/2023 1618   WBC 8.9 04/01/2023 1052   RBC 4.74 10/31/2023 1618   RBC 4.37 04/01/2023 1052   HGB 14.6 10/31/2023 1618   HCT 44.6 10/31/2023 1618   PLT 218 10/31/2023 1618   MCV 94 10/31/2023 1618   MCH 30.8 10/31/2023 1618   MCH 31.4 04/01/2023 1052   MCHC 32.7 10/31/2023 1618   MCHC 33.3 04/01/2023 1052   RDW 13.0 10/31/2023 1618   LYMPHSABS 1.5 10/31/2023 1618   MONOABS 0.4 04/01/2023 1052   EOSABS 0.3 10/31/2023 1618   BASOSABS 0.0 10/31/2023 1618    ASSESSMENT AND PLAN: 1. Type 2 diabetes mellitus with morbid obesity (HCC) (Primary) A1c is at goal however she has gained several pounds since last visit attributed to dietary indiscretions and lack of exercise.  She plans to get back to the gym after the new year.  Advised to be mindful of portion sizes and avoid excessive sugary snacks over the Christmas holidays.  I recommend keeping the Trulicity  dose at the 1.5 mg/week for now -  Continue Trulicity  1.5 mg weekly. - Encouraged healthy eating and regular exercise. - Advised portion control and limit sweets. - POCT glucose (manual entry) - POCT glycosylated hemoglobin (Hb A1C) - CBC - Comprehensive metabolic panel with GFR  2. Long-term (current) use of injectable non-insulin  antidiabetic drugs See #1 above  3. Hypertension associated with diabetes (HCC) At goal.  Continue lisinopril  20 mg  daily and amlodipine  10 mg daily.  4. Hyperlipidemia associated with type 2 diabetes mellitus (HCC) Close to goal.  Continue pravastatin  40 mg daily.  5. Postoperative hypothyroidism Continue levothyroxine  100 mcg daily.   Patient was given the opportunity to ask questions.  Patient verbalized understanding of the plan and was able to repeat key elements of the plan.   This documentation was completed using Paediatric nurse.  Any transcriptional errors are unintentional.  Orders Placed This Encounter  Procedures   CBC   Comprehensive metabolic panel with GFR   POCT glucose (manual entry)   POCT glycosylated hemoglobin (Hb A1C)     Requested Prescriptions    No prescriptions requested or ordered in this encounter    Return in about 4 months (around 12/31/2024) for Medicare Wellness Visit in  2-4  wks withLPN/CMA.  Barnie Louder, MD, FACP     [1]  Current Outpatient Medications on File Prior to Visit  Medication Sig Dispense Refill   Accu-Chek Softclix Lancets lancets UAD to check BS once a day 100 each 12   albuterol  (VENTOLIN  HFA) 108 (90 Base) MCG/ACT inhaler Inhale 2 puffs into the lungs every 6 (six) hours as needed for wheezing or shortness of breath. 18 g 2   amLODipine  (NORVASC ) 10 MG tablet TAKE 1 TABLET BY MOUTH DAILY 100 tablet 1   aspirin  81 MG tablet Take 1 tablet (81 mg total) daily by mouth. 90 tablet 0   Blood Glucose Monitoring Suppl (ACCU-CHEK GUIDE) w/Device KIT UAD to check BS once a day 1 kit 0   brimonidine -timolol  (COMBIGAN ) 0.2-0.5 % ophthalmic solution Place 1 drop into both eyes every 12 (twelve) hours.     cycloSPORINE  (RESTASIS ) 0.05 % ophthalmic emulsion Place 1 drop into both eyes 2 (two) times daily.     Dulaglutide  (TRULICITY ) 1.5 MG/0.5ML SOAJ INJECT 1.5 MG INTO THE SKIN ONCE A WEEK 4 mL 5   glucose blood (ACCU-CHEK GUIDE TEST) test strip UAD to check BS once a day 100 each 12   Lancet Devices (ACCU-CHEK SOFTCLIX) lancets  Use as instructed for 3 times daily testing of blood glucose 1 each 0   Lancets Misc. (ACCU-CHEK FASTCLIX LANCET) KIT Use as directed 1 kit 12   latanoprost  (XALATAN ) 0.005 % ophthalmic solution Place 1 drop into both eyes at bedtime.      levothyroxine  (SYNTHROID ) 100 MCG tablet TAKE 1 TABLET BY MOUTH ONCE DAILY BEFORE BREAKFAST 100 tablet 0   lisinopril  (ZESTRIL ) 20 MG tablet TAKE 1 TABLET BY MOUTH DAILY 100 tablet 0   pravastatin  (PRAVACHOL ) 40 MG tablet TAKE 1 TABLET BY MOUTH DAILY 100 tablet 2   No current facility-administered medications on file prior to visit.  [2]  Allergies Allergen Reactions   Farxiga  [Dapagliflozin ] Other (See Comments)    Recurrent vaginal yeast   Latex Hives and Other (See Comments)   Metformin      Other reaction(s): hair loss   Metformin  And Related     Hair loss   Penicillin G Other (See Comments)   Penicillins Hives   Warfarin Sodium Other (See Comments)  Warfarin Sodium Other (See Comments)   Iodinated Contrast Media Other (See Comments)    PT STATES SHE HAD AN ALLERGIC REACTION OF BLISTERS ON HANDS AND FEET 2 DAYS AFTER CT SCAN W/ IV CONTRAST INJECTION, DR. MANSELL SUGGESTS 13 HR PREP//A.C.

## 2024-09-02 ENCOUNTER — Ambulatory Visit: Payer: Self-pay | Admitting: Internal Medicine

## 2024-09-02 LAB — COMPREHENSIVE METABOLIC PANEL WITH GFR
ALT: 9 IU/L (ref 0–32)
AST: 20 IU/L (ref 0–40)
Albumin: 4 g/dL (ref 3.9–4.9)
Alkaline Phosphatase: 94 IU/L (ref 49–135)
BUN/Creatinine Ratio: 15 (ref 12–28)
BUN: 14 mg/dL (ref 8–27)
Bilirubin Total: 0.6 mg/dL (ref 0.0–1.2)
CO2: 23 mmol/L (ref 20–29)
Calcium: 9.1 mg/dL (ref 8.7–10.3)
Chloride: 106 mmol/L (ref 96–106)
Creatinine, Ser: 0.91 mg/dL (ref 0.57–1.00)
Globulin, Total: 3.1 g/dL (ref 1.5–4.5)
Glucose: 78 mg/dL (ref 70–99)
Potassium: 4.4 mmol/L (ref 3.5–5.2)
Sodium: 141 mmol/L (ref 134–144)
Total Protein: 7.1 g/dL (ref 6.0–8.5)
eGFR: 68 mL/min/1.73 (ref >59)

## 2024-09-02 LAB — CBC
Hematocrit: 42.3 % (ref 34.0–46.6)
Hemoglobin: 14.1 g/dL (ref 11.1–15.9)
MCH: 32.1 pg (ref 26.6–33.0)
MCHC: 33.3 g/dL (ref 31.5–35.7)
MCV: 96 fL (ref 79–97)
Platelets: 220 x10E3/uL (ref 150–450)
RBC: 4.39 x10E6/uL (ref 3.77–5.28)
RDW: 12.6 % (ref 11.7–15.4)
WBC: 8.8 x10E3/uL (ref 3.4–10.8)

## 2024-09-07 ENCOUNTER — Other Ambulatory Visit: Payer: Self-pay | Admitting: Internal Medicine

## 2024-09-07 DIAGNOSIS — I152 Hypertension secondary to endocrine disorders: Secondary | ICD-10-CM

## 2024-11-11 ENCOUNTER — Ambulatory Visit: Payer: Self-pay

## 2025-01-01 ENCOUNTER — Ambulatory Visit: Payer: Self-pay | Admitting: Internal Medicine
